# Patient Record
Sex: Male | Born: 1935
Health system: Southern US, Community
[De-identification: ages and names within clinical notes are randomized; demographics above are authoritative.]

## PROBLEM LIST (undated history)

## (undated) DIAGNOSIS — D45 Polycythemia vera: Secondary | ICD-10-CM

## (undated) DIAGNOSIS — N400 Enlarged prostate without lower urinary tract symptoms: Secondary | ICD-10-CM

## (undated) DIAGNOSIS — D759 Disease of blood and blood-forming organs, unspecified: Secondary | ICD-10-CM

## (undated) DIAGNOSIS — F329 Major depressive disorder, single episode, unspecified: Secondary | ICD-10-CM

## (undated) DIAGNOSIS — I10 Essential (primary) hypertension: Secondary | ICD-10-CM

## (undated) DIAGNOSIS — E785 Hyperlipidemia, unspecified: Secondary | ICD-10-CM

## (undated) DIAGNOSIS — I350 Nonrheumatic aortic (valve) stenosis: Secondary | ICD-10-CM

## (undated) DIAGNOSIS — F32A Depression, unspecified: Secondary | ICD-10-CM

## (undated) DIAGNOSIS — M1990 Unspecified osteoarthritis, unspecified site: Secondary | ICD-10-CM

## (undated) HISTORY — DX: Disease of blood and blood-forming organs, unspecified: D75.9

## (undated) HISTORY — DX: Nonrheumatic aortic (valve) stenosis: I35.0

## (undated) HISTORY — DX: Depression, unspecified: F32.A

## (undated) HISTORY — DX: Benign prostatic hyperplasia without lower urinary tract symptoms: N40.0

## (undated) HISTORY — DX: Hyperlipidemia, unspecified: E78.5

## (undated) HISTORY — DX: Essential (primary) hypertension: I10

## (undated) HISTORY — DX: Unspecified osteoarthritis, unspecified site: M19.90

## (undated) HISTORY — DX: Major depressive disorder, single episode, unspecified: F32.9

## (undated) HISTORY — PX: HEMORROIDECTOMY: SUR656

## (undated) HISTORY — DX: Polycythemia vera: D45

---

## 1999-11-29 ENCOUNTER — Encounter (HOSPITAL_COMMUNITY): Admission: RE | Admit: 1999-11-29 | Discharge: 2000-02-27 | Payer: Self-pay | Admitting: Internal Medicine

## 2001-07-12 ENCOUNTER — Encounter (INDEPENDENT_AMBULATORY_CARE_PROVIDER_SITE_OTHER): Payer: Self-pay | Admitting: Specialist

## 2001-07-12 ENCOUNTER — Ambulatory Visit (HOSPITAL_COMMUNITY): Admission: RE | Admit: 2001-07-12 | Discharge: 2001-07-12 | Payer: Self-pay | Admitting: Oncology

## 2001-08-06 ENCOUNTER — Encounter (HOSPITAL_COMMUNITY): Payer: Self-pay | Admitting: Oncology

## 2001-08-06 ENCOUNTER — Ambulatory Visit (HOSPITAL_COMMUNITY): Admission: RE | Admit: 2001-08-06 | Discharge: 2001-08-06 | Payer: Self-pay | Admitting: Oncology

## 2002-04-03 ENCOUNTER — Encounter: Admission: RE | Admit: 2002-04-03 | Discharge: 2002-04-03 | Payer: Self-pay | Admitting: Internal Medicine

## 2002-04-03 ENCOUNTER — Encounter: Payer: Self-pay | Admitting: Internal Medicine

## 2002-11-14 ENCOUNTER — Ambulatory Visit (HOSPITAL_COMMUNITY): Admission: RE | Admit: 2002-11-14 | Discharge: 2002-11-14 | Payer: Self-pay | Admitting: Gastroenterology

## 2002-11-14 ENCOUNTER — Encounter (INDEPENDENT_AMBULATORY_CARE_PROVIDER_SITE_OTHER): Payer: Self-pay | Admitting: Specialist

## 2004-05-30 ENCOUNTER — Ambulatory Visit: Payer: Self-pay | Admitting: Oncology

## 2004-07-26 ENCOUNTER — Ambulatory Visit: Payer: Self-pay | Admitting: Oncology

## 2004-09-20 ENCOUNTER — Ambulatory Visit: Payer: Self-pay | Admitting: Oncology

## 2004-11-14 ENCOUNTER — Ambulatory Visit: Payer: Self-pay | Admitting: Oncology

## 2005-01-16 ENCOUNTER — Ambulatory Visit: Payer: Self-pay | Admitting: Oncology

## 2005-03-14 ENCOUNTER — Ambulatory Visit: Payer: Self-pay | Admitting: Oncology

## 2005-05-09 ENCOUNTER — Ambulatory Visit: Payer: Self-pay | Admitting: Oncology

## 2005-07-04 ENCOUNTER — Ambulatory Visit: Payer: Self-pay | Admitting: Oncology

## 2005-08-29 ENCOUNTER — Ambulatory Visit: Payer: Self-pay | Admitting: Oncology

## 2005-10-25 ENCOUNTER — Ambulatory Visit: Payer: Self-pay | Admitting: Oncology

## 2005-10-25 LAB — CBC WITH DIFFERENTIAL/PLATELET
BASO%: 0.9 % (ref 0.0–2.0)
HCT: 46.2 % (ref 38.7–49.9)
HGB: 14.1 g/dL (ref 13.0–17.1)
LYMPH%: 6.9 % — ABNORMAL LOW (ref 14.0–48.0)
MCH: 19.8 pg — ABNORMAL LOW (ref 28.0–33.4)
MONO#: 0.7 10*3/uL (ref 0.1–0.9)
MONO%: 4 % (ref 0.0–13.0)
NEUT#: 14.5 10*3/uL — ABNORMAL HIGH (ref 1.5–6.5)
RBC: 7.11 10*6/uL — ABNORMAL HIGH (ref 4.20–5.71)

## 2005-11-21 LAB — CBC WITH DIFFERENTIAL/PLATELET
BASO%: 0.1 % (ref 0.0–2.0)
Basophils Absolute: 0 10*3/uL (ref 0.0–0.1)
HCT: 43 % (ref 38.7–49.9)
HGB: 13.2 g/dL (ref 13.0–17.1)
MCHC: 30.7 g/dL — ABNORMAL LOW (ref 32.0–35.9)
MONO#: 0.8 10*3/uL (ref 0.1–0.9)
NEUT%: 84.5 % — ABNORMAL HIGH (ref 40.0–75.0)
RDW: 19.9 % — ABNORMAL HIGH (ref 11.2–14.6)
WBC: 16.7 10*3/uL — ABNORMAL HIGH (ref 4.0–10.0)
lymph#: 1.3 10*3/uL (ref 0.9–3.3)

## 2005-12-13 ENCOUNTER — Ambulatory Visit: Payer: Self-pay | Admitting: Oncology

## 2005-12-27 LAB — CBC WITH DIFFERENTIAL/PLATELET
BASO%: 0.3 % (ref 0.0–2.0)
EOS%: 2.4 % (ref 0.0–7.0)
MCHC: 30.5 g/dL — ABNORMAL LOW (ref 32.0–35.9)
MONO#: 0.8 10*3/uL (ref 0.1–0.9)
RBC: 7.04 10*6/uL — ABNORMAL HIGH (ref 4.20–5.71)
WBC: 17.1 10*3/uL — ABNORMAL HIGH (ref 4.0–10.0)
lymph#: 1.2 10*3/uL (ref 0.9–3.3)

## 2006-01-23 LAB — CBC WITH DIFFERENTIAL/PLATELET
BASO%: 0.7 % (ref 0.0–2.0)
EOS%: 2.9 % (ref 0.0–7.0)
HCT: 45.2 % (ref 38.7–49.9)
MCH: 19.9 pg — ABNORMAL LOW (ref 28.0–33.4)
MCHC: 30.7 g/dL — ABNORMAL LOW (ref 32.0–35.9)
NEUT%: 84.1 % — ABNORMAL HIGH (ref 40.0–75.0)
RBC: 6.99 10*6/uL — ABNORMAL HIGH (ref 4.20–5.71)
lymph#: 1.4 10*3/uL (ref 0.9–3.3)

## 2006-02-15 ENCOUNTER — Ambulatory Visit: Payer: Self-pay | Admitting: Oncology

## 2006-02-19 LAB — COMPREHENSIVE METABOLIC PANEL
ALT: 15 U/L (ref 0–40)
AST: 16 U/L (ref 0–37)
Albumin: 4.2 g/dL (ref 3.5–5.2)
Alkaline Phosphatase: 65 U/L (ref 39–117)
Potassium: 4.6 mEq/L (ref 3.5–5.3)
Sodium: 140 mEq/L (ref 135–145)
Total Bilirubin: 0.7 mg/dL (ref 0.3–1.2)
Total Protein: 6.1 g/dL (ref 6.0–8.3)

## 2006-02-19 LAB — CBC WITH DIFFERENTIAL/PLATELET
BASO%: 0.9 % (ref 0.0–2.0)
Eosinophils Absolute: 0.4 10*3/uL (ref 0.0–0.5)
LYMPH%: 8.3 % — ABNORMAL LOW (ref 14.0–48.0)
MCHC: 31 g/dL — ABNORMAL LOW (ref 32.0–35.9)
MCV: 64.8 fL — ABNORMAL LOW (ref 81.6–98.0)
MONO%: 5.1 % (ref 0.0–13.0)
NEUT#: 13.6 10*3/uL — ABNORMAL HIGH (ref 1.5–6.5)
Platelets: 274 10*3/uL (ref 145–400)
RBC: 6.73 10*6/uL — ABNORMAL HIGH (ref 4.20–5.71)
RDW: 20 % — ABNORMAL HIGH (ref 11.2–14.6)
WBC: 16.4 10*3/uL — ABNORMAL HIGH (ref 4.0–10.0)

## 2006-03-22 LAB — CBC WITH DIFFERENTIAL/PLATELET
BASO%: 1.3 % (ref 0.0–2.0)
EOS%: 2.1 % (ref 0.0–7.0)
HCT: 45.8 % (ref 38.7–49.9)
LYMPH%: 7.5 % — ABNORMAL LOW (ref 14.0–48.0)
MCH: 19.7 pg — ABNORMAL LOW (ref 28.0–33.4)
MCHC: 30.2 g/dL — ABNORMAL LOW (ref 32.0–35.9)
MCV: 65.4 fL — ABNORMAL LOW (ref 81.6–98.0)
MONO#: 0.7 10*3/uL (ref 0.1–0.9)
MONO%: 3.7 % (ref 0.0–13.0)
NEUT%: 85.4 % — ABNORMAL HIGH (ref 40.0–75.0)
Platelets: 331 10*3/uL (ref 145–400)
RBC: 6.99 10*6/uL — ABNORMAL HIGH (ref 4.20–5.71)

## 2006-04-20 ENCOUNTER — Ambulatory Visit: Payer: Self-pay | Admitting: Oncology

## 2006-05-22 LAB — CBC WITH DIFFERENTIAL/PLATELET
BASO%: 0.2 % (ref 0.0–2.0)
Basophils Absolute: 0 10*3/uL (ref 0.0–0.1)
HCT: 45.2 % (ref 38.7–49.9)
HGB: 13.8 g/dL (ref 13.0–17.1)
MCHC: 30.4 g/dL — ABNORMAL LOW (ref 32.0–35.9)
MONO#: 0.3 10*3/uL (ref 0.1–0.9)
NEUT#: 14.7 10*3/uL — ABNORMAL HIGH (ref 1.5–6.5)
NEUT%: 86.4 % — ABNORMAL HIGH (ref 40.0–75.0)
WBC: 17 10*3/uL — ABNORMAL HIGH (ref 4.0–10.0)
lymph#: 1.4 10*3/uL (ref 0.9–3.3)

## 2006-05-22 LAB — COMPREHENSIVE METABOLIC PANEL
ALT: 16 U/L (ref 0–53)
CO2: 29 mEq/L (ref 19–32)
Calcium: 8.8 mg/dL (ref 8.4–10.5)
Chloride: 103 mEq/L (ref 96–112)
Creatinine, Ser: 1.19 mg/dL (ref 0.40–1.50)
Total Protein: 6 g/dL (ref 6.0–8.3)

## 2006-05-22 LAB — LACTATE DEHYDROGENASE: LDH: 205 U/L (ref 94–250)

## 2006-06-15 ENCOUNTER — Ambulatory Visit: Payer: Self-pay | Admitting: Oncology

## 2006-06-19 LAB — CBC WITH DIFFERENTIAL/PLATELET
BASO%: 1.6 % (ref 0.0–2.0)
LYMPH%: 8.3 % — ABNORMAL LOW (ref 14.0–48.0)
MCHC: 29.7 g/dL — ABNORMAL LOW (ref 32.0–35.9)
MCV: 65.4 fL — ABNORMAL LOW (ref 81.6–98.0)
MONO%: 5 % (ref 0.0–13.0)
Platelets: 378 10*3/uL (ref 145–400)
RBC: 6.65 10*6/uL — ABNORMAL HIGH (ref 4.20–5.71)
RDW: 14.9 % — ABNORMAL HIGH (ref 11.2–14.6)
WBC: 17.5 10*3/uL — ABNORMAL HIGH (ref 4.0–10.0)

## 2006-07-16 LAB — CBC WITH DIFFERENTIAL/PLATELET
BASO%: 0.5 % (ref 0.0–2.0)
MCHC: 30 g/dL — ABNORMAL LOW (ref 32.0–35.9)
MONO#: 0.8 10*3/uL (ref 0.1–0.9)
RBC: 6.87 10*6/uL — ABNORMAL HIGH (ref 4.20–5.71)
RDW: 20 % — ABNORMAL HIGH (ref 11.2–14.6)
WBC: 18.4 10*3/uL — ABNORMAL HIGH (ref 4.0–10.0)
lymph#: 1.6 10*3/uL (ref 0.9–3.3)

## 2006-08-08 ENCOUNTER — Ambulatory Visit: Payer: Self-pay | Admitting: Oncology

## 2006-08-13 LAB — CBC WITH DIFFERENTIAL/PLATELET
BASO%: 1.3 % (ref 0.0–2.0)
MCHC: 28.9 g/dL — ABNORMAL LOW (ref 32.0–35.9)
MONO#: 0.7 10*3/uL (ref 0.1–0.9)
RBC: 7.18 10*6/uL — ABNORMAL HIGH (ref 4.20–5.71)
RDW: 14.8 % — ABNORMAL HIGH (ref 11.2–14.6)
WBC: 18.4 10*3/uL — ABNORMAL HIGH (ref 4.0–10.0)
lymph#: 1.5 10*3/uL (ref 0.9–3.3)

## 2006-09-10 LAB — CBC WITH DIFFERENTIAL/PLATELET
BASO%: 1 % (ref 0.0–2.0)
Basophils Absolute: 0.2 10*3/uL — ABNORMAL HIGH (ref 0.0–0.1)
HCT: 44.3 % (ref 38.7–49.9)
HGB: 13.7 g/dL (ref 13.0–17.1)
MCHC: 30.8 g/dL — ABNORMAL LOW (ref 32.0–35.9)
MONO#: 0.6 10*3/uL (ref 0.1–0.9)
NEUT%: 84.3 % — ABNORMAL HIGH (ref 40.0–75.0)
WBC: 16.5 10*3/uL — ABNORMAL HIGH (ref 4.0–10.0)
lymph#: 1.2 10*3/uL (ref 0.9–3.3)

## 2006-10-03 ENCOUNTER — Ambulatory Visit: Payer: Self-pay | Admitting: Oncology

## 2006-10-11 LAB — CBC WITH DIFFERENTIAL/PLATELET
Basophils Absolute: 0.2 10*3/uL — ABNORMAL HIGH (ref 0.0–0.1)
Eosinophils Absolute: 0.5 10*3/uL (ref 0.0–0.5)
HCT: 49 % (ref 38.7–49.9)
HGB: 14.3 g/dL (ref 13.0–17.1)
LYMPH%: 6.8 % — ABNORMAL LOW (ref 14.0–48.0)
MONO#: 0.9 10*3/uL (ref 0.1–0.9)
NEUT#: 17.7 10*3/uL — ABNORMAL HIGH (ref 1.5–6.5)
NEUT%: 85.2 % — ABNORMAL HIGH (ref 40.0–75.0)
Platelets: 319 10*3/uL (ref 145–400)
RBC: 7.45 10*6/uL — ABNORMAL HIGH (ref 4.20–5.71)
WBC: 20.7 10*3/uL — ABNORMAL HIGH (ref 4.0–10.0)

## 2006-11-05 LAB — CBC WITH DIFFERENTIAL/PLATELET
BASO%: 1.2 % (ref 0.0–2.0)
HCT: 46.6 % (ref 38.7–49.9)
LYMPH%: 8.2 % — ABNORMAL LOW (ref 14.0–48.0)
MCHC: 29.2 g/dL — ABNORMAL LOW (ref 32.0–35.9)
MCV: 66 fL — ABNORMAL LOW (ref 81.6–98.0)
MONO#: 0.7 10*3/uL (ref 0.1–0.9)
NEUT%: 83.1 % — ABNORMAL HIGH (ref 40.0–75.0)
Platelets: 309 10*3/uL (ref 145–400)
WBC: 18.9 10*3/uL — ABNORMAL HIGH (ref 4.0–10.0)

## 2006-11-28 ENCOUNTER — Ambulatory Visit: Payer: Self-pay | Admitting: Oncology

## 2006-11-30 LAB — CBC WITH DIFFERENTIAL/PLATELET
BASO%: 0.4 % (ref 0.0–2.0)
EOS%: 2 % (ref 0.0–7.0)
HCT: 43.4 % (ref 38.7–49.9)
LYMPH%: 7.6 % — ABNORMAL LOW (ref 14.0–48.0)
MCH: 19.9 pg — ABNORMAL LOW (ref 28.0–33.4)
MCHC: 31.5 g/dL — ABNORMAL LOW (ref 32.0–35.9)
NEUT%: 86.9 % — ABNORMAL HIGH (ref 40.0–75.0)
Platelets: 371 10*3/uL (ref 145–400)

## 2006-11-30 LAB — COMPREHENSIVE METABOLIC PANEL
AST: 18 U/L (ref 0–37)
Alkaline Phosphatase: 74 U/L (ref 39–117)
BUN: 26 mg/dL — ABNORMAL HIGH (ref 6–23)
Creatinine, Ser: 1.15 mg/dL (ref 0.40–1.50)

## 2006-12-21 LAB — CBC WITH DIFFERENTIAL/PLATELET
BASO%: 1.4 % (ref 0.0–2.0)
HCT: 46.5 % (ref 38.7–49.9)
MCHC: 29.6 g/dL — ABNORMAL LOW (ref 32.0–35.9)
MONO#: 0.8 10*3/uL (ref 0.1–0.9)
NEUT%: 83.5 % — ABNORMAL HIGH (ref 40.0–75.0)
RBC: 7.15 10*6/uL — ABNORMAL HIGH (ref 4.20–5.71)
RDW: 14.6 % (ref 11.2–14.6)
WBC: 18.5 10*3/uL — ABNORMAL HIGH (ref 4.0–10.0)
lymph#: 1.5 10*3/uL (ref 0.9–3.3)

## 2007-01-16 ENCOUNTER — Ambulatory Visit: Payer: Self-pay | Admitting: Oncology

## 2007-01-21 LAB — CBC WITH DIFFERENTIAL/PLATELET
Basophils Absolute: 0.2 10*3/uL — ABNORMAL HIGH (ref 0.0–0.1)
Eosinophils Absolute: 0.5 10*3/uL (ref 0.0–0.5)
HCT: 45.8 % (ref 38.7–49.9)
HGB: 13.5 g/dL (ref 13.0–17.1)
MONO#: 0.7 10*3/uL (ref 0.1–0.9)
NEUT#: 16.1 10*3/uL — ABNORMAL HIGH (ref 1.5–6.5)
NEUT%: 85.2 % — ABNORMAL HIGH (ref 40.0–75.0)
RDW: 14.6 % (ref 11.2–14.6)
WBC: 18.9 10*3/uL — ABNORMAL HIGH (ref 4.0–10.0)
lymph#: 1.4 10*3/uL (ref 0.9–3.3)

## 2007-02-15 LAB — CBC WITH DIFFERENTIAL/PLATELET
Basophils Absolute: 0.3 10*3/uL — ABNORMAL HIGH (ref 0.0–0.1)
EOS%: 2.9 % (ref 0.0–7.0)
Eosinophils Absolute: 0.6 10*3/uL — ABNORMAL HIGH (ref 0.0–0.5)
HCT: 43.9 % (ref 38.7–49.9)
HGB: 12.6 g/dL — ABNORMAL LOW (ref 13.0–17.1)
MCH: 19.2 pg — ABNORMAL LOW (ref 28.0–33.4)
MCV: 66.8 fL — ABNORMAL LOW (ref 81.6–98.0)
MONO%: 4.3 % (ref 0.0–13.0)
NEUT#: 16.2 10*3/uL — ABNORMAL HIGH (ref 1.5–6.5)
NEUT%: 82.9 % — ABNORMAL HIGH (ref 40.0–75.0)

## 2007-03-13 ENCOUNTER — Ambulatory Visit: Payer: Self-pay | Admitting: Oncology

## 2007-03-15 LAB — CBC WITH DIFFERENTIAL/PLATELET
Basophils Absolute: 0.3 10*3/uL — ABNORMAL HIGH (ref 0.0–0.1)
EOS%: 3 % (ref 0.0–7.0)
Eosinophils Absolute: 0.6 10*3/uL — ABNORMAL HIGH (ref 0.0–0.5)
HGB: 12.9 g/dL — ABNORMAL LOW (ref 13.0–17.1)
LYMPH%: 8.1 % — ABNORMAL LOW (ref 14.0–48.0)
MCH: 19 pg — ABNORMAL LOW (ref 28.0–33.4)
MCV: 66.5 fL — ABNORMAL LOW (ref 81.6–98.0)
MONO%: 3.6 % (ref 0.0–13.0)
NEUT#: 15.8 10*3/uL — ABNORMAL HIGH (ref 1.5–6.5)
Platelets: 330 10*3/uL (ref 145–400)
RBC: 6.78 10*6/uL — ABNORMAL HIGH (ref 4.20–5.71)
RDW: 14.7 % — ABNORMAL HIGH (ref 11.2–14.6)

## 2007-04-17 LAB — CBC WITH DIFFERENTIAL/PLATELET
BASO%: 1.2 % (ref 0.0–2.0)
EOS%: 3.1 % (ref 0.0–7.0)
LYMPH%: 8.5 % — ABNORMAL LOW (ref 14.0–48.0)
MCH: 20.1 pg — ABNORMAL LOW (ref 28.0–33.4)
MCHC: 29.3 g/dL — ABNORMAL LOW (ref 32.0–35.9)
MCV: 68.6 fL — ABNORMAL LOW (ref 81.6–98.0)
MONO%: 4.8 % (ref 0.0–13.0)
NEUT#: 15.7 10*3/uL — ABNORMAL HIGH (ref 1.5–6.5)
Platelets: 318 10*3/uL (ref 145–400)
RBC: 6.85 10*6/uL — ABNORMAL HIGH (ref 4.20–5.71)
RDW: 14.5 % (ref 11.2–14.6)

## 2007-05-08 ENCOUNTER — Ambulatory Visit: Payer: Self-pay | Admitting: Oncology

## 2007-05-10 LAB — CBC WITH DIFFERENTIAL/PLATELET
BASO%: 1.4 % (ref 0.0–2.0)
Eosinophils Absolute: 0.5 10*3/uL (ref 0.0–0.5)
LYMPH%: 6.9 % — ABNORMAL LOW (ref 14.0–48.0)
MCHC: 29.4 g/dL — ABNORMAL LOW (ref 32.0–35.9)
MCV: 68 fL — ABNORMAL LOW (ref 81.6–98.0)
MONO#: 0.9 10*3/uL (ref 0.1–0.9)
MONO%: 4.6 % (ref 0.0–13.0)
NEUT#: 16.4 10*3/uL — ABNORMAL HIGH (ref 1.5–6.5)
RBC: 6.37 10*6/uL — ABNORMAL HIGH (ref 4.20–5.71)
RDW: 14.2 % (ref 11.2–14.6)
WBC: 19.3 10*3/uL — ABNORMAL HIGH (ref 4.0–10.0)

## 2007-06-07 LAB — COMPREHENSIVE METABOLIC PANEL
ALT: 14 U/L (ref 0–53)
Albumin: 4.2 g/dL (ref 3.5–5.2)
CO2: 26 mEq/L (ref 19–32)
Calcium: 9.1 mg/dL (ref 8.4–10.5)
Chloride: 102 mEq/L (ref 96–112)
Glucose, Bld: 108 mg/dL — ABNORMAL HIGH (ref 70–99)
Potassium: 4.6 mEq/L (ref 3.5–5.3)
Sodium: 140 mEq/L (ref 135–145)
Total Protein: 6.1 g/dL (ref 6.0–8.3)

## 2007-06-07 LAB — LACTATE DEHYDROGENASE: LDH: 219 U/L (ref 94–250)

## 2007-06-07 LAB — CBC WITH DIFFERENTIAL/PLATELET
BASO%: 0 % (ref 0.0–2.0)
Eosinophils Absolute: 0.5 10*3/uL (ref 0.0–0.5)
LYMPH%: 7.8 % — ABNORMAL LOW (ref 14.0–48.0)
MCHC: 31.3 g/dL — ABNORMAL LOW (ref 32.0–35.9)
MONO#: 0.8 10*3/uL (ref 0.1–0.9)
NEUT#: 16.3 10*3/uL — ABNORMAL HIGH (ref 1.5–6.5)
Platelets: 278 10*3/uL (ref 145–400)
RBC: 6.42 10*6/uL — ABNORMAL HIGH (ref 4.20–5.71)
WBC: 19.1 10*3/uL — ABNORMAL HIGH (ref 4.0–10.0)
lymph#: 1.5 10*3/uL (ref 0.9–3.3)

## 2007-07-03 ENCOUNTER — Ambulatory Visit: Payer: Self-pay | Admitting: Oncology

## 2007-07-05 LAB — CBC WITH DIFFERENTIAL/PLATELET
BASO%: 0.5 % (ref 0.0–2.0)
EOS%: 2.3 % (ref 0.0–7.0)
HCT: 40.9 % (ref 38.7–49.9)
MCH: 20.3 pg — ABNORMAL LOW (ref 28.0–33.4)
MCHC: 31.7 g/dL — ABNORMAL LOW (ref 32.0–35.9)
MONO#: 0.6 10*3/uL (ref 0.1–0.9)
NEUT%: 87 % — ABNORMAL HIGH (ref 40.0–75.0)
RBC: 6.39 10*6/uL — ABNORMAL HIGH (ref 4.20–5.71)
RDW: 18.7 % — ABNORMAL HIGH (ref 11.2–14.6)
WBC: 19.9 10*3/uL — ABNORMAL HIGH (ref 4.0–10.0)
lymph#: 1.4 10*3/uL (ref 0.9–3.3)

## 2007-08-02 LAB — CBC WITH DIFFERENTIAL/PLATELET
BASO%: 1.3 % (ref 0.0–2.0)
Basophils Absolute: 0.3 10*3/uL — ABNORMAL HIGH (ref 0.0–0.1)
EOS%: 2.4 % (ref 0.0–7.0)
HGB: 13.9 g/dL (ref 13.0–17.1)
MCH: 19.1 pg — ABNORMAL LOW (ref 28.0–33.4)
MCHC: 28.6 g/dL — ABNORMAL LOW (ref 32.0–35.9)
MONO#: 0.8 10*3/uL (ref 0.1–0.9)
RDW: 13.4 % (ref 11.2–14.6)
WBC: 20.5 10*3/uL — ABNORMAL HIGH (ref 4.0–10.0)
lymph#: 1.3 10*3/uL (ref 0.9–3.3)

## 2007-08-28 ENCOUNTER — Ambulatory Visit: Payer: Self-pay | Admitting: Oncology

## 2007-08-30 LAB — CBC WITH DIFFERENTIAL/PLATELET
BASO%: 1.5 % (ref 0.0–2.0)
Basophils Absolute: 0.3 10*3/uL — ABNORMAL HIGH (ref 0.0–0.1)
EOS%: 3.1 % (ref 0.0–7.0)
HCT: 45.8 % (ref 38.7–49.9)
HGB: 13.2 g/dL (ref 13.0–17.1)
MCH: 19.2 pg — ABNORMAL LOW (ref 28.0–33.4)
MONO#: 1 10*3/uL — ABNORMAL HIGH (ref 0.1–0.9)
NEUT%: 83.5 % — ABNORMAL HIGH (ref 40.0–75.0)
RDW: 13.7 % (ref 11.2–14.6)
WBC: 20.6 10*3/uL — ABNORMAL HIGH (ref 4.0–10.0)
lymph#: 1.4 10*3/uL (ref 0.9–3.3)

## 2007-09-27 LAB — CBC WITH DIFFERENTIAL/PLATELET
BASO%: 1.5 % (ref 0.0–2.0)
EOS%: 3.4 % (ref 0.0–7.0)
MCH: 19.5 pg — ABNORMAL LOW (ref 28.0–33.4)
MCHC: 30.7 g/dL — ABNORMAL LOW (ref 32.0–35.9)
MONO#: 0.7 10*3/uL (ref 0.1–0.9)
NEUT%: 84 % — ABNORMAL HIGH (ref 40.0–75.0)
RBC: 6.7 10*6/uL — ABNORMAL HIGH (ref 4.20–5.71)
RDW: 16.8 % — ABNORMAL HIGH (ref 11.2–14.6)
WBC: 19.6 10*3/uL — ABNORMAL HIGH (ref 4.0–10.0)
lymph#: 1.4 10*3/uL (ref 0.9–3.3)

## 2007-10-24 ENCOUNTER — Ambulatory Visit: Payer: Self-pay | Admitting: Oncology

## 2007-10-24 LAB — CBC WITH DIFFERENTIAL/PLATELET
BASO%: 1 % (ref 0.0–2.0)
EOS%: 2.5 % (ref 0.0–7.0)
HCT: 47.4 % (ref 38.7–49.9)
LYMPH%: 7.3 % — ABNORMAL LOW (ref 14.0–48.0)
MCH: 19.3 pg — ABNORMAL LOW (ref 28.0–33.4)
MCHC: 30 g/dL — ABNORMAL LOW (ref 32.0–35.9)
MCV: 64.5 fL — ABNORMAL LOW (ref 81.6–98.0)
MONO#: 1.1 10*3/uL — ABNORMAL HIGH (ref 0.1–0.9)
MONO%: 4.7 % (ref 0.0–13.0)
NEUT%: 84.6 % — ABNORMAL HIGH (ref 40.0–75.0)
Platelets: 325 10*3/uL (ref 145–400)
RBC: 7.35 10*6/uL — ABNORMAL HIGH (ref 4.20–5.71)
WBC: 23.1 10*3/uL — ABNORMAL HIGH (ref 4.0–10.0)

## 2007-11-22 LAB — CBC WITH DIFFERENTIAL/PLATELET
BASO%: 1.5 % (ref 0.0–2.0)
Eosinophils Absolute: 0.7 10*3/uL — ABNORMAL HIGH (ref 0.0–0.5)
MCHC: 30.4 g/dL — ABNORMAL LOW (ref 32.0–35.9)
MONO#: 0.8 10*3/uL (ref 0.1–0.9)
MONO%: 3.9 % (ref 0.0–13.0)
NEUT#: 17.5 10*3/uL — ABNORMAL HIGH (ref 1.5–6.5)
RBC: 6.7 10*6/uL — ABNORMAL HIGH (ref 4.20–5.71)
RDW: 16.8 % — ABNORMAL HIGH (ref 11.2–14.6)
WBC: 20.8 10*3/uL — ABNORMAL HIGH (ref 4.0–10.0)

## 2007-11-22 LAB — COMPREHENSIVE METABOLIC PANEL
ALT: 17 U/L (ref 0–53)
Albumin: 4.2 g/dL (ref 3.5–5.2)
Alkaline Phosphatase: 67 U/L (ref 39–117)
Glucose, Bld: 121 mg/dL — ABNORMAL HIGH (ref 70–99)
Potassium: 4.6 mEq/L (ref 3.5–5.3)
Sodium: 139 mEq/L (ref 135–145)
Total Protein: 6.2 g/dL (ref 6.0–8.3)

## 2007-12-18 ENCOUNTER — Ambulatory Visit: Payer: Self-pay | Admitting: Oncology

## 2007-12-20 LAB — CBC WITH DIFFERENTIAL/PLATELET
BASO%: 1.3 % (ref 0.0–2.0)
Basophils Absolute: 0.3 10*3/uL — ABNORMAL HIGH (ref 0.0–0.1)
HCT: 42.6 % (ref 38.7–49.9)
HGB: 13.2 g/dL (ref 13.0–17.1)
MONO#: 0.8 10*3/uL (ref 0.1–0.9)
NEUT#: 18.7 10*3/uL — ABNORMAL HIGH (ref 1.5–6.5)
NEUT%: 85.6 % — ABNORMAL HIGH (ref 40.0–75.0)
RDW: 16.7 % — ABNORMAL HIGH (ref 11.2–14.6)
WBC: 21.9 10*3/uL — ABNORMAL HIGH (ref 4.0–10.0)
lymph#: 1.4 10*3/uL (ref 0.9–3.3)

## 2008-01-20 LAB — CBC WITH DIFFERENTIAL/PLATELET
BASO%: 1.5 % (ref 0.0–2.0)
HCT: 45.2 % (ref 38.7–49.9)
MCHC: 30.7 g/dL — ABNORMAL LOW (ref 32.0–35.9)
MONO#: 1 10*3/uL — ABNORMAL HIGH (ref 0.1–0.9)
RBC: 7.08 10*6/uL — ABNORMAL HIGH (ref 4.20–5.71)
RDW: 17.2 % — ABNORMAL HIGH (ref 11.2–14.6)
WBC: 23.9 10*3/uL — ABNORMAL HIGH (ref 4.0–10.0)
lymph#: 1.5 10*3/uL (ref 0.9–3.3)

## 2008-02-09 ENCOUNTER — Ambulatory Visit: Payer: Self-pay | Admitting: Oncology

## 2008-02-17 LAB — CBC WITH DIFFERENTIAL/PLATELET
BASO%: 1.2 % (ref 0.0–2.0)
EOS%: 3.3 % (ref 0.0–7.0)
LYMPH%: 7.1 % — ABNORMAL LOW (ref 14.0–48.0)
MCH: 19.5 pg — ABNORMAL LOW (ref 28.0–33.4)
MCHC: 30.4 g/dL — ABNORMAL LOW (ref 32.0–35.9)
MONO#: 1 10*3/uL — ABNORMAL HIGH (ref 0.1–0.9)
MONO%: 4.4 % (ref 0.0–13.0)
Platelets: 291 10*3/uL (ref 145–400)
RBC: 6.86 10*6/uL — ABNORMAL HIGH (ref 4.20–5.71)
WBC: 21.6 10*3/uL — ABNORMAL HIGH (ref 4.0–10.0)

## 2008-03-17 LAB — CBC WITH DIFFERENTIAL/PLATELET
BASO%: 1.3 % (ref 0.0–2.0)
Basophils Absolute: 0.3 10*3/uL — ABNORMAL HIGH (ref 0.0–0.1)
EOS%: 3 % (ref 0.0–7.0)
HGB: 13.4 g/dL (ref 13.0–17.1)
MCH: 19.3 pg — ABNORMAL LOW (ref 28.0–33.4)
MCHC: 30.3 g/dL — ABNORMAL LOW (ref 32.0–35.9)
MCV: 63.8 fL — ABNORMAL LOW (ref 81.6–98.0)
MONO%: 4.5 % (ref 0.0–13.0)
NEUT%: 85.1 % — ABNORMAL HIGH (ref 40.0–75.0)
RDW: 16.4 % — ABNORMAL HIGH (ref 11.2–14.6)

## 2008-04-09 ENCOUNTER — Ambulatory Visit: Payer: Self-pay | Admitting: Oncology

## 2008-04-15 LAB — CBC WITH DIFFERENTIAL/PLATELET
Basophils Absolute: 0.3 10*3/uL — ABNORMAL HIGH (ref 0.0–0.1)
EOS%: 3.3 % (ref 0.0–7.0)
HGB: 13.1 g/dL (ref 13.0–17.1)
MCH: 19.2 pg — ABNORMAL LOW (ref 28.0–33.4)
MCV: 63.9 fL — ABNORMAL LOW (ref 81.6–98.0)
MONO%: 4.2 % (ref 0.0–13.0)
RBC: 6.8 10*6/uL — ABNORMAL HIGH (ref 4.20–5.71)
RDW: 16.6 % — ABNORMAL HIGH (ref 11.2–14.6)

## 2008-05-12 LAB — CBC WITH DIFFERENTIAL/PLATELET
Basophils Absolute: 0.3 10*3/uL — ABNORMAL HIGH (ref 0.0–0.1)
EOS%: 3.2 % (ref 0.0–7.0)
HCT: 40.4 % (ref 38.7–49.9)
HGB: 12.2 g/dL — ABNORMAL LOW (ref 13.0–17.1)
LYMPH%: 6.3 % — ABNORMAL LOW (ref 14.0–48.0)
MCH: 19.1 pg — ABNORMAL LOW (ref 28.0–33.4)
MCV: 63.2 fL — ABNORMAL LOW (ref 81.6–98.0)
NEUT%: 84.6 % — ABNORMAL HIGH (ref 40.0–75.0)
Platelets: 309 10*3/uL (ref 145–400)
lymph#: 1.2 10*3/uL (ref 0.9–3.3)

## 2008-06-05 ENCOUNTER — Ambulatory Visit: Payer: Self-pay | Admitting: Oncology

## 2008-06-09 LAB — CBC WITH DIFFERENTIAL/PLATELET
Basophils Absolute: 0 10*3/uL (ref 0.0–0.1)
Eosinophils Absolute: 0.5 10*3/uL (ref 0.0–0.5)
HCT: 42.1 % (ref 38.7–49.9)
HGB: 12.9 g/dL — ABNORMAL LOW (ref 13.0–17.1)
LYMPH%: 7.2 % — ABNORMAL LOW (ref 14.0–48.0)
MONO#: 0.7 10*3/uL (ref 0.1–0.9)
NEUT%: 86.7 % — ABNORMAL HIGH (ref 40.0–75.0)
Platelets: 279 10*3/uL (ref 145–400)
WBC: 20.5 10*3/uL — ABNORMAL HIGH (ref 4.0–10.0)
lymph#: 1.5 10*3/uL (ref 0.9–3.3)

## 2008-07-02 LAB — CBC WITH DIFFERENTIAL/PLATELET
BASO%: 1.4 % (ref 0.0–2.0)
Eosinophils Absolute: 0.6 10*3/uL — ABNORMAL HIGH (ref 0.0–0.5)
HCT: 45.2 % (ref 38.7–49.9)
MCHC: 30.2 g/dL — ABNORMAL LOW (ref 32.0–35.9)
MONO#: 0.9 10*3/uL (ref 0.1–0.9)
NEUT#: 19.2 10*3/uL — ABNORMAL HIGH (ref 1.5–6.5)
Platelets: 312 10*3/uL (ref 145–400)
RBC: 7.2 10*6/uL — ABNORMAL HIGH (ref 4.20–5.71)
WBC: 22.4 10*3/uL — ABNORMAL HIGH (ref 4.0–10.0)
lymph#: 1.4 10*3/uL (ref 0.9–3.3)

## 2008-07-31 ENCOUNTER — Ambulatory Visit: Payer: Self-pay | Admitting: Oncology

## 2008-08-04 LAB — CBC WITH DIFFERENTIAL/PLATELET
BASO%: 0.2 % (ref 0.0–2.0)
Eosinophils Absolute: 0.6 10*3/uL — ABNORMAL HIGH (ref 0.0–0.5)
LYMPH%: 5 % — ABNORMAL LOW (ref 14.0–48.0)
MCHC: 30.1 g/dL — ABNORMAL LOW (ref 32.0–35.9)
MONO#: 0.6 10*3/uL (ref 0.1–0.9)
MONO%: 3 % (ref 0.0–13.0)
NEUT#: 18.2 10*3/uL — ABNORMAL HIGH (ref 1.5–6.5)
Platelets: 260 10*3/uL (ref 145–400)
RBC: 7 10*6/uL — ABNORMAL HIGH (ref 4.20–5.71)
RDW: 20.4 % — ABNORMAL HIGH (ref 11.2–14.6)
WBC: 20.5 10*3/uL — ABNORMAL HIGH (ref 4.0–10.0)

## 2008-08-11 ENCOUNTER — Ambulatory Visit (HOSPITAL_COMMUNITY): Admission: RE | Admit: 2008-08-11 | Discharge: 2008-08-11 | Payer: Self-pay | Admitting: Gastroenterology

## 2008-09-01 LAB — CBC WITH DIFFERENTIAL/PLATELET
EOS%: 3.3 % (ref 0.0–7.0)
LYMPH%: 6.8 % — ABNORMAL LOW (ref 14.0–48.0)
MCH: 19.7 pg — ABNORMAL LOW (ref 28.0–33.4)
MCHC: 30.5 g/dL — ABNORMAL LOW (ref 32.0–35.9)
MCV: 64.7 fL — ABNORMAL LOW (ref 81.6–98.0)
MONO%: 3 % (ref 0.0–13.0)
NEUT#: 17.8 10*3/uL — ABNORMAL HIGH (ref 1.5–6.5)
Platelets: 338 10*3/uL (ref 145–400)
RBC: 6.45 10*6/uL — ABNORMAL HIGH (ref 4.20–5.71)
RDW: 19.7 % — ABNORMAL HIGH (ref 11.2–14.6)

## 2008-09-25 ENCOUNTER — Ambulatory Visit: Payer: Self-pay | Admitting: Oncology

## 2008-09-29 LAB — CBC WITH DIFFERENTIAL/PLATELET
BASO%: 1.3 % (ref 0.0–2.0)
Basophils Absolute: 0.3 10*3/uL — ABNORMAL HIGH (ref 0.0–0.1)
Eosinophils Absolute: 0.6 10*3/uL — ABNORMAL HIGH (ref 0.0–0.5)
HCT: 44.1 % (ref 38.4–49.9)
HGB: 13.2 g/dL (ref 13.0–17.1)
LYMPH%: 5.9 % — ABNORMAL LOW (ref 14.0–49.0)
MCHC: 29.9 g/dL — ABNORMAL LOW (ref 32.0–36.0)
MONO#: 1 10*3/uL — ABNORMAL HIGH (ref 0.1–0.9)
NEUT#: 18 10*3/uL — ABNORMAL HIGH (ref 1.5–6.5)
NEUT%: 85.5 % — ABNORMAL HIGH (ref 39.0–75.0)
Platelets: 288 10*3/uL (ref 140–400)
WBC: 21.1 10*3/uL — ABNORMAL HIGH (ref 4.0–10.3)
lymph#: 1.3 10*3/uL (ref 0.9–3.3)

## 2008-11-02 LAB — CBC WITH DIFFERENTIAL/PLATELET
Eosinophils Absolute: 0.6 10*3/uL — ABNORMAL HIGH (ref 0.0–0.5)
HCT: 42.7 % (ref 38.4–49.9)
LYMPH%: 6.8 % — ABNORMAL LOW (ref 14.0–49.0)
MCV: 64.7 fL — ABNORMAL LOW (ref 79.3–98.0)
MONO#: 0.4 10*3/uL (ref 0.1–0.9)
MONO%: 2 % (ref 0.0–14.0)
NEUT#: 18.7 10*3/uL — ABNORMAL HIGH (ref 1.5–6.5)
NEUT%: 88.4 % — ABNORMAL HIGH (ref 39.0–75.0)
Platelets: 274 10*3/uL (ref 140–400)
RBC: 6.61 10*6/uL — ABNORMAL HIGH (ref 4.20–5.82)
WBC: 21.2 10*3/uL — ABNORMAL HIGH (ref 4.0–10.3)

## 2008-11-02 LAB — COMPREHENSIVE METABOLIC PANEL
Alkaline Phosphatase: 66 U/L (ref 39–117)
BUN: 23 mg/dL (ref 6–23)
CO2: 25 mEq/L (ref 19–32)
Creatinine, Ser: 1.04 mg/dL (ref 0.40–1.50)
Glucose, Bld: 127 mg/dL — ABNORMAL HIGH (ref 70–99)
Sodium: 140 mEq/L (ref 135–145)
Total Bilirubin: 0.6 mg/dL (ref 0.3–1.2)

## 2008-11-02 LAB — LACTATE DEHYDROGENASE: LDH: 217 U/L (ref 94–250)

## 2008-11-30 ENCOUNTER — Ambulatory Visit: Payer: Self-pay | Admitting: Oncology

## 2008-12-02 LAB — CBC WITH DIFFERENTIAL/PLATELET
Basophils Absolute: 0.3 10*3/uL — ABNORMAL HIGH (ref 0.0–0.1)
EOS%: 2.6 % (ref 0.0–7.0)
Eosinophils Absolute: 0.5 10*3/uL (ref 0.0–0.5)
HCT: 45 % (ref 38.4–49.9)
HGB: 13.4 g/dL (ref 13.0–17.1)
MCH: 19.4 pg — ABNORMAL LOW (ref 27.2–33.4)
MCV: 65.1 fL — ABNORMAL LOW (ref 79.3–98.0)
MONO%: 4.3 % (ref 0.0–14.0)
NEUT#: 18 10*3/uL — ABNORMAL HIGH (ref 1.5–6.5)
NEUT%: 85.4 % — ABNORMAL HIGH (ref 39.0–75.0)
Platelets: 293 10*3/uL (ref 140–400)

## 2008-12-30 LAB — CBC WITH DIFFERENTIAL/PLATELET
BASO%: 0.7 % (ref 0.0–2.0)
Basophils Absolute: 0.2 10*3/uL — ABNORMAL HIGH (ref 0.0–0.1)
Eosinophils Absolute: 0.5 10*3/uL (ref 0.0–0.5)
HCT: 45.1 % (ref 38.4–49.9)
HGB: 13.6 g/dL (ref 13.0–17.1)
LYMPH%: 5.9 % — ABNORMAL LOW (ref 14.0–49.0)
MCHC: 30.2 g/dL — ABNORMAL LOW (ref 32.0–36.0)
MONO#: 0.8 10*3/uL (ref 0.1–0.9)
NEUT%: 86.8 % — ABNORMAL HIGH (ref 39.0–75.0)
Platelets: 279 10*3/uL (ref 140–400)
WBC: 20.3 10*3/uL — ABNORMAL HIGH (ref 4.0–10.3)

## 2009-01-25 ENCOUNTER — Ambulatory Visit: Payer: Self-pay | Admitting: Oncology

## 2009-01-27 LAB — CBC WITH DIFFERENTIAL/PLATELET
Basophils Absolute: 0.2 10*3/uL — ABNORMAL HIGH (ref 0.0–0.1)
Eosinophils Absolute: 0.6 10*3/uL — ABNORMAL HIGH (ref 0.0–0.5)
HGB: 12.8 g/dL — ABNORMAL LOW (ref 13.0–17.1)
MCV: 64.8 fL — ABNORMAL LOW (ref 79.3–98.0)
MONO#: 0.9 10*3/uL (ref 0.1–0.9)
MONO%: 4.2 % (ref 0.0–14.0)
NEUT#: 18.2 10*3/uL — ABNORMAL HIGH (ref 1.5–6.5)
Platelets: 283 10*3/uL (ref 140–400)
RDW: 19.1 % — ABNORMAL HIGH (ref 11.0–14.6)
WBC: 21.1 10*3/uL — ABNORMAL HIGH (ref 4.0–10.3)

## 2009-02-24 ENCOUNTER — Ambulatory Visit: Payer: Self-pay | Admitting: Oncology

## 2009-02-24 LAB — CBC WITH DIFFERENTIAL/PLATELET
Basophils Absolute: 0.2 10*3/uL — ABNORMAL HIGH (ref 0.0–0.1)
EOS%: 3.3 % (ref 0.0–7.0)
Eosinophils Absolute: 0.6 10*3/uL — ABNORMAL HIGH (ref 0.0–0.5)
HGB: 12.9 g/dL — ABNORMAL LOW (ref 13.0–17.1)
NEUT#: 16.2 10*3/uL — ABNORMAL HIGH (ref 1.5–6.5)
RDW: 19.6 % — ABNORMAL HIGH (ref 11.0–14.6)
lymph#: 1.3 10*3/uL (ref 0.9–3.3)

## 2009-03-24 LAB — CBC WITH DIFFERENTIAL/PLATELET
BASO%: 1.1 % (ref 0.0–2.0)
EOS%: 3.1 % (ref 0.0–7.0)
HGB: 13.4 g/dL (ref 13.0–17.1)
MCH: 19.7 pg — ABNORMAL LOW (ref 27.2–33.4)
MCHC: 30 g/dL — ABNORMAL LOW (ref 32.0–36.0)
RBC: 6.8 10*6/uL — ABNORMAL HIGH (ref 4.20–5.82)
RDW: 19.6 % — ABNORMAL HIGH (ref 11.0–14.6)
lymph#: 1.1 10*3/uL (ref 0.9–3.3)

## 2009-04-16 ENCOUNTER — Ambulatory Visit: Payer: Self-pay | Admitting: Oncology

## 2009-04-20 LAB — CBC WITH DIFFERENTIAL/PLATELET
BASO%: 0.1 % (ref 0.0–2.0)
Basophils Absolute: 0 10*3/uL (ref 0.0–0.1)
EOS%: 3.3 % (ref 0.0–7.0)
HCT: 41.4 % (ref 38.4–49.9)
HGB: 12.8 g/dL — ABNORMAL LOW (ref 13.0–17.1)
LYMPH%: 6.4 % — ABNORMAL LOW (ref 14.0–49.0)
MCH: 19.9 pg — ABNORMAL LOW (ref 27.2–33.4)
MCHC: 31 g/dL — ABNORMAL LOW (ref 32.0–36.0)
MCV: 64.3 fL — ABNORMAL LOW (ref 79.3–98.0)
MONO%: 3.2 % (ref 0.0–14.0)
NEUT%: 87 % — ABNORMAL HIGH (ref 39.0–75.0)
Platelets: 278 10*3/uL (ref 140–400)
lymph#: 1.4 10*3/uL (ref 0.9–3.3)

## 2009-04-20 LAB — LACTATE DEHYDROGENASE: LDH: 284 U/L — ABNORMAL HIGH (ref 94–250)

## 2009-04-20 LAB — COMPREHENSIVE METABOLIC PANEL
ALT: 20 U/L (ref 0–53)
AST: 23 U/L (ref 0–37)
Alkaline Phosphatase: 77 U/L (ref 39–117)
BUN: 23 mg/dL (ref 6–23)
Calcium: 8.8 mg/dL (ref 8.4–10.5)
Chloride: 102 mEq/L (ref 96–112)
Creatinine, Ser: 1.24 mg/dL (ref 0.40–1.50)
Total Bilirubin: 0.6 mg/dL (ref 0.3–1.2)

## 2009-05-18 ENCOUNTER — Ambulatory Visit: Payer: Self-pay | Admitting: Oncology

## 2009-05-18 LAB — CBC WITH DIFFERENTIAL/PLATELET
Basophils Absolute: 0.3 10*3/uL — ABNORMAL HIGH (ref 0.0–0.1)
EOS%: 2.8 % (ref 0.0–7.0)
Eosinophils Absolute: 0.6 10*3/uL — ABNORMAL HIGH (ref 0.0–0.5)
HCT: 45.7 % (ref 38.4–49.9)
HGB: 13.5 g/dL (ref 13.0–17.1)
MCH: 19.5 pg — ABNORMAL LOW (ref 27.2–33.4)
MCV: 66.1 fL — ABNORMAL LOW (ref 79.3–98.0)
MONO%: 5.1 % (ref 0.0–14.0)
NEUT#: 17.9 10*3/uL — ABNORMAL HIGH (ref 1.5–6.5)
NEUT%: 84.9 % — ABNORMAL HIGH (ref 39.0–75.0)

## 2009-06-15 LAB — CBC WITH DIFFERENTIAL/PLATELET
Eosinophils Absolute: 0.6 10*3/uL — ABNORMAL HIGH (ref 0.0–0.5)
HCT: 45.8 % (ref 38.4–49.9)
LYMPH%: 6.8 % — ABNORMAL LOW (ref 14.0–49.0)
MCV: 66.8 fL — ABNORMAL LOW (ref 79.3–98.0)
MONO#: 0.8 10*3/uL (ref 0.1–0.9)
MONO%: 4.2 % (ref 0.0–14.0)
NEUT#: 16.4 10*3/uL — ABNORMAL HIGH (ref 1.5–6.5)
NEUT%: 84.5 % — ABNORMAL HIGH (ref 39.0–75.0)
Platelets: 277 10*3/uL (ref 140–400)
RBC: 6.86 10*6/uL — ABNORMAL HIGH (ref 4.20–5.82)
WBC: 19.4 10*3/uL — ABNORMAL HIGH (ref 4.0–10.3)
nRBC: 0 % (ref 0–0)

## 2009-07-08 ENCOUNTER — Ambulatory Visit: Payer: Self-pay | Admitting: Oncology

## 2009-07-19 LAB — CBC WITH DIFFERENTIAL/PLATELET
BASO%: 1.4 % (ref 0.0–2.0)
Basophils Absolute: 0.3 10*3/uL — ABNORMAL HIGH (ref 0.0–0.1)
EOS%: 3.2 % (ref 0.0–7.0)
Eosinophils Absolute: 0.7 10*3/uL — ABNORMAL HIGH (ref 0.0–0.5)
HCT: 45.2 % (ref 38.4–49.9)
HGB: 13.3 g/dL (ref 13.0–17.1)
LYMPH%: 5.9 % — ABNORMAL LOW (ref 14.0–49.0)
MCH: 19.4 pg — ABNORMAL LOW (ref 27.2–33.4)
MCHC: 29.4 g/dL — ABNORMAL LOW (ref 32.0–36.0)
MCV: 66 fL — ABNORMAL LOW (ref 79.3–98.0)
MONO#: 0.9 10*3/uL (ref 0.1–0.9)
MONO%: 4.3 % (ref 0.0–14.0)
NEUT#: 18.7 10*3/uL — ABNORMAL HIGH (ref 1.5–6.5)
NEUT%: 85.2 % — ABNORMAL HIGH (ref 39.0–75.0)
Platelets: 298 10*3/uL (ref 140–400)
RBC: 6.85 10*6/uL — ABNORMAL HIGH (ref 4.20–5.82)
RDW: 19.9 % — ABNORMAL HIGH (ref 11.0–14.6)
WBC: 21.9 10*3/uL — ABNORMAL HIGH (ref 4.0–10.3)
lymph#: 1.3 10*3/uL (ref 0.9–3.3)
nRBC: 0 % (ref 0–0)

## 2009-08-10 ENCOUNTER — Ambulatory Visit: Payer: Self-pay | Admitting: Oncology

## 2009-08-10 LAB — CBC WITH DIFFERENTIAL/PLATELET
BASO%: 1.2 % (ref 0.0–2.0)
Basophils Absolute: 0.3 10*3/uL — ABNORMAL HIGH (ref 0.0–0.1)
EOS%: 2.8 % (ref 0.0–7.0)
Eosinophils Absolute: 0.6 10*3/uL — ABNORMAL HIGH (ref 0.0–0.5)
HCT: 44.4 % (ref 38.4–49.9)
HGB: 13.1 g/dL (ref 13.0–17.1)
LYMPH%: 5.6 % — ABNORMAL LOW (ref 14.0–49.0)
MCH: 19.6 pg — ABNORMAL LOW (ref 27.2–33.4)
MCHC: 29.5 g/dL — ABNORMAL LOW (ref 32.0–36.0)
MCV: 66.4 fL — ABNORMAL LOW (ref 79.3–98.0)
MONO#: 1.1 10*3/uL — ABNORMAL HIGH (ref 0.1–0.9)
MONO%: 5 % (ref 0.0–14.0)
NEUT#: 18.6 10*3/uL — ABNORMAL HIGH (ref 1.5–6.5)
NEUT%: 85.4 % — ABNORMAL HIGH (ref 39.0–75.0)
Platelets: 267 10*3/uL (ref 140–400)
RBC: 6.69 10*6/uL — ABNORMAL HIGH (ref 4.20–5.82)
RDW: 19.6 % — ABNORMAL HIGH (ref 11.0–14.6)
WBC: 21.8 10*3/uL — ABNORMAL HIGH (ref 4.0–10.3)
lymph#: 1.2 10*3/uL (ref 0.9–3.3)
nRBC: 0 % (ref 0–0)

## 2009-09-07 LAB — CBC WITH DIFFERENTIAL/PLATELET
BASO%: 0.4 % (ref 0.0–2.0)
Basophils Absolute: 0.1 10*3/uL (ref 0.0–0.1)
EOS%: 2.5 % (ref 0.0–7.0)
HCT: 41.3 % (ref 38.4–49.9)
HGB: 12.5 g/dL — ABNORMAL LOW (ref 13.0–17.1)
MONO#: 0.8 10*3/uL (ref 0.1–0.9)
NEUT%: 88.4 % — ABNORMAL HIGH (ref 39.0–75.0)
RDW: 20.1 % — ABNORMAL HIGH (ref 11.0–14.6)
WBC: 22.4 10*3/uL — ABNORMAL HIGH (ref 4.0–10.3)
lymph#: 1.2 10*3/uL (ref 0.9–3.3)

## 2009-10-01 ENCOUNTER — Ambulatory Visit: Payer: Self-pay | Admitting: Oncology

## 2009-10-05 LAB — CBC WITH DIFFERENTIAL/PLATELET
BASO%: 1.1 % (ref 0.0–2.0)
Eosinophils Absolute: 0.6 10*3/uL — ABNORMAL HIGH (ref 0.0–0.5)
MCHC: 29 g/dL — ABNORMAL LOW (ref 32.0–36.0)
MONO#: 1.2 10*3/uL — ABNORMAL HIGH (ref 0.1–0.9)
NEUT#: 20.5 10*3/uL — ABNORMAL HIGH (ref 1.5–6.5)
RBC: 6.74 10*6/uL — ABNORMAL HIGH (ref 4.20–5.82)
RDW: 20.2 % — ABNORMAL HIGH (ref 11.0–14.6)
WBC: 23.9 10*3/uL — ABNORMAL HIGH (ref 4.0–10.3)
lymph#: 1.4 10*3/uL (ref 0.9–3.3)
nRBC: 0 % (ref 0–0)

## 2009-11-02 ENCOUNTER — Ambulatory Visit: Payer: Self-pay | Admitting: Oncology

## 2009-11-02 LAB — CBC WITH DIFFERENTIAL/PLATELET
Basophils Absolute: 0.3 10*3/uL — ABNORMAL HIGH (ref 0.0–0.1)
EOS%: 2.5 % (ref 0.0–7.0)
Eosinophils Absolute: 0.9 10*3/uL — ABNORMAL HIGH (ref 0.0–0.5)
HCT: 41.9 % (ref 38.4–49.9)
HGB: 12.6 g/dL — ABNORMAL LOW (ref 13.0–17.1)
MCH: 19.4 pg — ABNORMAL LOW (ref 27.2–33.4)
NEUT#: 33.8 10*3/uL — ABNORMAL HIGH (ref 1.5–6.5)
NEUT%: 88.6 % — ABNORMAL HIGH (ref 39.0–75.0)
lymph#: 1.2 10*3/uL (ref 0.9–3.3)

## 2009-11-02 LAB — COMPREHENSIVE METABOLIC PANEL
Albumin: 4 g/dL (ref 3.5–5.2)
Alkaline Phosphatase: 77 U/L (ref 39–117)
BUN: 26 mg/dL — ABNORMAL HIGH (ref 6–23)
CO2: 22 mEq/L (ref 19–32)
Calcium: 8.9 mg/dL (ref 8.4–10.5)
Chloride: 102 mEq/L (ref 96–112)
Glucose, Bld: 146 mg/dL — ABNORMAL HIGH (ref 70–99)
Potassium: 4.4 mEq/L (ref 3.5–5.3)

## 2009-11-02 LAB — LACTATE DEHYDROGENASE: LDH: 205 U/L (ref 94–250)

## 2009-12-01 LAB — CBC WITH DIFFERENTIAL/PLATELET
Eosinophils Absolute: 0.8 10*3/uL — ABNORMAL HIGH (ref 0.0–0.5)
HCT: 42.5 % (ref 38.4–49.9)
LYMPH%: 6.9 % — ABNORMAL LOW (ref 14.0–49.0)
MONO#: 0.9 10*3/uL (ref 0.1–0.9)
NEUT#: 17.8 10*3/uL — ABNORMAL HIGH (ref 1.5–6.5)
NEUT%: 84.3 % — ABNORMAL HIGH (ref 39.0–75.0)
Platelets: 318 10*3/uL (ref 140–400)
WBC: 21.2 10*3/uL — ABNORMAL HIGH (ref 4.0–10.3)
nRBC: 0 % (ref 0–0)

## 2009-12-27 ENCOUNTER — Ambulatory Visit: Payer: Self-pay | Admitting: Oncology

## 2009-12-29 LAB — CBC WITH DIFFERENTIAL/PLATELET
Basophils Absolute: 0.2 10*3/uL — ABNORMAL HIGH (ref 0.0–0.1)
Eosinophils Absolute: 0.7 10*3/uL — ABNORMAL HIGH (ref 0.0–0.5)
HGB: 13.1 g/dL (ref 13.0–17.1)
MONO#: 1 10*3/uL — ABNORMAL HIGH (ref 0.1–0.9)
NEUT#: 17.7 10*3/uL — ABNORMAL HIGH (ref 1.5–6.5)
RDW: 20.4 % — ABNORMAL HIGH (ref 11.0–14.6)
lymph#: 1.6 10*3/uL (ref 0.9–3.3)

## 2010-01-26 ENCOUNTER — Ambulatory Visit: Payer: Self-pay | Admitting: Oncology

## 2010-01-26 LAB — CBC WITH DIFFERENTIAL/PLATELET
BASO%: 1.1 % (ref 0.0–2.0)
HCT: 43.1 % (ref 38.4–49.9)
MCHC: 29.5 g/dL — ABNORMAL LOW (ref 32.0–36.0)
MONO#: 1 10*3/uL — ABNORMAL HIGH (ref 0.1–0.9)
NEUT%: 85.1 % — ABNORMAL HIGH (ref 39.0–75.0)
RBC: 6.49 10*6/uL — ABNORMAL HIGH (ref 4.20–5.82)
WBC: 23 10*3/uL — ABNORMAL HIGH (ref 4.0–10.3)
lymph#: 1.5 10*3/uL (ref 0.9–3.3)
nRBC: 0 % (ref 0–0)

## 2010-02-15 ENCOUNTER — Ambulatory Visit: Payer: Self-pay | Admitting: Cardiology

## 2010-02-18 ENCOUNTER — Ambulatory Visit: Payer: Self-pay | Admitting: Cardiology

## 2010-02-18 ENCOUNTER — Ambulatory Visit (HOSPITAL_COMMUNITY): Admission: RE | Admit: 2010-02-18 | Discharge: 2010-02-18 | Payer: Self-pay | Admitting: Cardiology

## 2010-02-23 LAB — CBC WITH DIFFERENTIAL/PLATELET
Eosinophils Absolute: 0.6 10*3/uL — ABNORMAL HIGH (ref 0.0–0.5)
MCV: 65.9 fL — ABNORMAL LOW (ref 79.3–98.0)
MONO%: 3.8 % (ref 0.0–14.0)
NEUT#: 21.1 10*3/uL — ABNORMAL HIGH (ref 1.5–6.5)
RBC: 6.27 10*6/uL — ABNORMAL HIGH (ref 4.20–5.82)
RDW: 19.2 % — ABNORMAL HIGH (ref 11.0–14.6)
WBC: 24.2 10*3/uL — ABNORMAL HIGH (ref 4.0–10.3)
lymph#: 1.3 10*3/uL (ref 0.9–3.3)
nRBC: 0 % (ref 0–0)

## 2010-03-23 ENCOUNTER — Ambulatory Visit: Payer: Self-pay | Admitting: Oncology

## 2010-03-23 LAB — CBC WITH DIFFERENTIAL/PLATELET
BASO%: 1.1 % (ref 0.0–2.0)
Basophils Absolute: 0.3 10*3/uL — ABNORMAL HIGH (ref 0.0–0.1)
EOS%: 2.7 % (ref 0.0–7.0)
Eosinophils Absolute: 0.6 10*3/uL — ABNORMAL HIGH (ref 0.0–0.5)
HCT: 43.5 % (ref 38.4–49.9)
HGB: 12.7 g/dL — ABNORMAL LOW (ref 13.0–17.1)
LYMPH%: 5.7 % — ABNORMAL LOW (ref 14.0–49.0)
MCH: 19.2 pg — ABNORMAL LOW (ref 27.2–33.4)
MCHC: 29.2 g/dL — ABNORMAL LOW (ref 32.0–36.0)
MCV: 65.9 fL — ABNORMAL LOW (ref 79.3–98.0)
MONO#: 0.8 10*3/uL (ref 0.1–0.9)
MONO%: 3.7 % (ref 0.0–14.0)
NEUT#: 19.4 10*3/uL — ABNORMAL HIGH (ref 1.5–6.5)
NEUT%: 86.8 % — ABNORMAL HIGH (ref 39.0–75.0)
Platelets: 283 10*3/uL (ref 140–400)
RBC: 6.6 10*6/uL — ABNORMAL HIGH (ref 4.20–5.82)
RDW: 19.7 % — ABNORMAL HIGH (ref 11.0–14.6)
WBC: 22.3 10*3/uL — ABNORMAL HIGH (ref 4.0–10.3)
lymph#: 1.3 10*3/uL (ref 0.9–3.3)
nRBC: 0 % (ref 0–0)

## 2010-04-20 LAB — TECHNOLOGIST REVIEW

## 2010-04-20 LAB — CBC WITH DIFFERENTIAL/PLATELET
BASO%: 0.1 % (ref 0.0–2.0)
Basophils Absolute: 0 10*3/uL (ref 0.0–0.1)
EOS%: 2.4 % (ref 0.0–7.0)
Eosinophils Absolute: 0.6 10*3/uL — ABNORMAL HIGH (ref 0.0–0.5)
HCT: 40 % (ref 38.4–49.9)
HGB: 12.2 g/dL — ABNORMAL LOW (ref 13.0–17.1)
LYMPH%: 5.1 % — ABNORMAL LOW (ref 14.0–49.0)
MCH: 19.6 pg — ABNORMAL LOW (ref 27.2–33.4)
MCHC: 30.6 g/dL — ABNORMAL LOW (ref 32.0–36.0)
MCV: 64.1 fL — ABNORMAL LOW (ref 79.3–98.0)
MONO#: 0.7 10*3/uL (ref 0.1–0.9)
MONO%: 2.6 % (ref 0.0–14.0)
NEUT#: 22.4 10*3/uL — ABNORMAL HIGH (ref 1.5–6.5)
NEUT%: 89.8 % — ABNORMAL HIGH (ref 39.0–75.0)
Platelets: 302 10*3/uL (ref 140–400)
RBC: 6.24 10*6/uL — ABNORMAL HIGH (ref 4.20–5.82)
RDW: 19.9 % — ABNORMAL HIGH (ref 11.0–14.6)
WBC: 24.9 10*3/uL — ABNORMAL HIGH (ref 4.0–10.3)
lymph#: 1.3 10*3/uL (ref 0.9–3.3)

## 2010-04-20 LAB — COMPREHENSIVE METABOLIC PANEL
ALT: 17 U/L (ref 0–53)
AST: 21 U/L (ref 0–37)
Albumin: 4.3 g/dL (ref 3.5–5.2)
Alkaline Phosphatase: 77 U/L (ref 39–117)
BUN: 25 mg/dL — ABNORMAL HIGH (ref 6–23)
CO2: 27 mEq/L (ref 19–32)
Calcium: 9.5 mg/dL (ref 8.4–10.5)
Chloride: 102 mEq/L (ref 96–112)
Creatinine, Ser: 1.17 mg/dL (ref 0.40–1.50)
Glucose, Bld: 82 mg/dL (ref 70–99)
Potassium: 4.5 mEq/L (ref 3.5–5.3)
Sodium: 138 mEq/L (ref 135–145)
Total Bilirubin: 0.6 mg/dL (ref 0.3–1.2)
Total Protein: 5.9 g/dL — ABNORMAL LOW (ref 6.0–8.3)

## 2010-04-20 LAB — LACTATE DEHYDROGENASE: LDH: 284 U/L — ABNORMAL HIGH (ref 94–250)

## 2010-04-20 LAB — URIC ACID: Uric Acid, Serum: 9.9 mg/dL — ABNORMAL HIGH (ref 4.0–7.8)

## 2010-05-16 ENCOUNTER — Ambulatory Visit: Payer: Self-pay | Admitting: Oncology

## 2010-05-20 LAB — CBC WITH DIFFERENTIAL/PLATELET
Basophils Absolute: 0.2 10*3/uL — ABNORMAL HIGH (ref 0.0–0.1)
Eosinophils Absolute: 0.7 10*3/uL — ABNORMAL HIGH (ref 0.0–0.5)
HCT: 40.5 % (ref 38.4–49.9)
HGB: 12 g/dL — ABNORMAL LOW (ref 13.0–17.1)
LYMPH%: 4.8 % — ABNORMAL LOW (ref 14.0–49.0)
MCV: 64.7 fL — ABNORMAL LOW (ref 79.3–98.0)
MONO#: 1.2 10*3/uL — ABNORMAL HIGH (ref 0.1–0.9)
MONO%: 4.4 % (ref 0.0–14.0)
NEUT#: 22.9 10*3/uL — ABNORMAL HIGH (ref 1.5–6.5)
NEUT%: 87.5 % — ABNORMAL HIGH (ref 39.0–75.0)
Platelets: 278 10*3/uL (ref 140–400)
WBC: 26.2 10*3/uL — ABNORMAL HIGH (ref 4.0–10.3)
nRBC: 0 % (ref 0–0)

## 2010-06-15 ENCOUNTER — Ambulatory Visit: Payer: Self-pay | Admitting: Oncology

## 2010-06-15 LAB — CBC WITH DIFFERENTIAL/PLATELET
Eosinophils Absolute: 0.7 10*3/uL — ABNORMAL HIGH (ref 0.0–0.5)
HCT: 43.5 % (ref 38.4–49.9)
LYMPH%: 5.2 % — ABNORMAL LOW (ref 14.0–49.0)
MONO#: 1 10*3/uL — ABNORMAL HIGH (ref 0.1–0.9)
NEUT#: 20.5 10*3/uL — ABNORMAL HIGH (ref 1.5–6.5)
NEUT%: 86.4 % — ABNORMAL HIGH (ref 39.0–75.0)
Platelets: 280 10*3/uL (ref 140–400)
RBC: 6.68 10*6/uL — ABNORMAL HIGH (ref 4.20–5.82)
WBC: 23.7 10*3/uL — ABNORMAL HIGH (ref 4.0–10.3)
lymph#: 1.2 10*3/uL (ref 0.9–3.3)

## 2010-07-13 ENCOUNTER — Ambulatory Visit: Payer: Self-pay | Admitting: Oncology

## 2010-07-13 LAB — CBC WITH DIFFERENTIAL/PLATELET
Basophils Absolute: 0.3 10*3/uL — ABNORMAL HIGH (ref 0.0–0.1)
EOS%: 3.4 % (ref 0.0–7.0)
Eosinophils Absolute: 0.8 10*3/uL — ABNORMAL HIGH (ref 0.0–0.5)
HCT: 41.5 % (ref 38.4–49.9)
HGB: 12.4 g/dL — ABNORMAL LOW (ref 13.0–17.1)
MCH: 19.3 pg — ABNORMAL LOW (ref 27.2–33.4)
MCV: 64.6 fL — ABNORMAL LOW (ref 79.3–98.0)
NEUT#: 20.7 10*3/uL — ABNORMAL HIGH (ref 1.5–6.5)
NEUT%: 84.6 % — ABNORMAL HIGH (ref 39.0–75.0)
RDW: 19.6 % — ABNORMAL HIGH (ref 11.0–14.6)
lymph#: 1.4 10*3/uL (ref 0.9–3.3)

## 2010-08-10 LAB — CBC WITH DIFFERENTIAL/PLATELET
BASO%: 0.9 % (ref 0.0–2.0)
Basophils Absolute: 0.2 10*3/uL — ABNORMAL HIGH (ref 0.0–0.1)
EOS%: 2.7 % (ref 0.0–7.0)
Eosinophils Absolute: 0.7 10*3/uL — ABNORMAL HIGH (ref 0.0–0.5)
HCT: 44.3 % (ref 38.4–49.9)
HGB: 13.2 g/dL (ref 13.0–17.1)
LYMPH%: 5.4 % — ABNORMAL LOW (ref 14.0–49.0)
MCH: 19.4 pg — ABNORMAL LOW (ref 27.2–33.4)
MCHC: 29.8 g/dL — ABNORMAL LOW (ref 32.0–36.0)
MCV: 65.1 fL — ABNORMAL LOW (ref 79.3–98.0)
MONO#: 1.1 10*3/uL — ABNORMAL HIGH (ref 0.1–0.9)
MONO%: 4.4 % (ref 0.0–14.0)
NEUT#: 21.4 10*3/uL — ABNORMAL HIGH (ref 1.5–6.5)
NEUT%: 86.6 % — ABNORMAL HIGH (ref 39.0–75.0)
Platelets: 258 10*3/uL (ref 140–400)
RBC: 6.8 10*6/uL — ABNORMAL HIGH (ref 4.20–5.82)
RDW: 19.8 % — ABNORMAL HIGH (ref 11.0–14.6)
WBC: 24.7 10*3/uL — ABNORMAL HIGH (ref 4.0–10.3)
lymph#: 1.3 10*3/uL (ref 0.9–3.3)
nRBC: 0 % (ref 0–0)

## 2010-09-07 ENCOUNTER — Other Ambulatory Visit (HOSPITAL_COMMUNITY): Payer: Self-pay | Admitting: Oncology

## 2010-09-07 ENCOUNTER — Encounter (HOSPITAL_BASED_OUTPATIENT_CLINIC_OR_DEPARTMENT_OTHER): Payer: Medicare Other | Admitting: Oncology

## 2010-09-07 DIAGNOSIS — D45 Polycythemia vera: Secondary | ICD-10-CM

## 2010-09-07 LAB — CBC WITH DIFFERENTIAL/PLATELET
BASO%: 1 % (ref 0.0–2.0)
Basophils Absolute: 0.3 10*3/uL — ABNORMAL HIGH (ref 0.0–0.1)
HCT: 43.4 % (ref 38.4–49.9)
HGB: 12.8 g/dL — ABNORMAL LOW (ref 13.0–17.1)
LYMPH%: 5.6 % — ABNORMAL LOW (ref 14.0–49.0)
MCHC: 29.5 g/dL — ABNORMAL LOW (ref 32.0–36.0)
MONO#: 1.4 10*3/uL — ABNORMAL HIGH (ref 0.1–0.9)
NEUT%: 85.8 % — ABNORMAL HIGH (ref 39.0–75.0)
Platelets: 264 10*3/uL (ref 140–400)
WBC: 27.8 10*3/uL — ABNORMAL HIGH (ref 4.0–10.3)
lymph#: 1.6 10*3/uL (ref 0.9–3.3)

## 2010-10-05 ENCOUNTER — Encounter (HOSPITAL_BASED_OUTPATIENT_CLINIC_OR_DEPARTMENT_OTHER): Payer: Medicare Other | Admitting: Oncology

## 2010-10-05 ENCOUNTER — Other Ambulatory Visit (HOSPITAL_COMMUNITY): Payer: Self-pay | Admitting: Oncology

## 2010-10-05 DIAGNOSIS — D45 Polycythemia vera: Secondary | ICD-10-CM

## 2010-10-05 LAB — CBC WITH DIFFERENTIAL/PLATELET
Basophils Absolute: 0.3 10*3/uL — ABNORMAL HIGH (ref 0.0–0.1)
EOS%: 2.7 % (ref 0.0–7.0)
Eosinophils Absolute: 0.7 10*3/uL — ABNORMAL HIGH (ref 0.0–0.5)
HCT: 41.7 % (ref 38.4–49.9)
HGB: 12.3 g/dL — ABNORMAL LOW (ref 13.0–17.1)
LYMPH%: 6.6 % — ABNORMAL LOW (ref 14.0–49.0)
MCH: 19.1 pg — ABNORMAL LOW (ref 27.2–33.4)
MCV: 64.8 fL — ABNORMAL LOW (ref 79.3–98.0)
MONO%: 3.7 % (ref 0.0–14.0)
NEUT#: 22 10*3/uL — ABNORMAL HIGH (ref 1.5–6.5)
NEUT%: 85.9 % — ABNORMAL HIGH (ref 39.0–75.0)
Platelets: 273 10*3/uL (ref 140–400)

## 2010-11-03 ENCOUNTER — Other Ambulatory Visit (HOSPITAL_COMMUNITY): Payer: Self-pay | Admitting: Oncology

## 2010-11-03 ENCOUNTER — Encounter (HOSPITAL_BASED_OUTPATIENT_CLINIC_OR_DEPARTMENT_OTHER): Payer: Medicare Other | Admitting: Oncology

## 2010-11-03 DIAGNOSIS — D45 Polycythemia vera: Secondary | ICD-10-CM

## 2010-11-03 LAB — COMPREHENSIVE METABOLIC PANEL
ALT: 19 U/L (ref 0–53)
Alkaline Phosphatase: 80 U/L (ref 39–117)
CO2: 26 mEq/L (ref 19–32)
Creatinine, Ser: 1.22 mg/dL (ref 0.40–1.50)
Glucose, Bld: 66 mg/dL — ABNORMAL LOW (ref 70–99)
Sodium: 139 mEq/L (ref 135–145)
Total Bilirubin: 0.6 mg/dL (ref 0.3–1.2)

## 2010-11-03 LAB — FERRITIN: Ferritin: 8 ng/mL — ABNORMAL LOW (ref 22–322)

## 2010-11-03 LAB — CBC WITH DIFFERENTIAL/PLATELET
BASO%: 0 % (ref 0.0–2.0)
HCT: 42.5 % (ref 38.4–49.9)
LYMPH%: 6.6 % — ABNORMAL LOW (ref 14.0–49.0)
MCHC: 30.1 g/dL — ABNORMAL LOW (ref 32.0–36.0)
MCV: 64.4 fL — ABNORMAL LOW (ref 79.3–98.0)
MONO#: 1 10*3/uL — ABNORMAL HIGH (ref 0.1–0.9)
MONO%: 4 % (ref 0.0–14.0)
NEUT%: 87.3 % — ABNORMAL HIGH (ref 39.0–75.0)
Platelets: 254 10*3/uL (ref 140–400)
RBC: 6.59 10*6/uL — ABNORMAL HIGH (ref 4.20–5.82)
WBC: 26.1 10*3/uL — ABNORMAL HIGH (ref 4.0–10.3)

## 2010-11-03 LAB — LACTATE DEHYDROGENASE: LDH: 289 U/L — ABNORMAL HIGH (ref 94–250)

## 2010-11-03 LAB — IRON AND TIBC: Iron: <10 ug/dL — ABNORMAL LOW (ref 42–165)

## 2010-11-29 NOTE — Op Note (Signed)
NAME:  GUILLAUME, WENINGER               ACCOUNT NO.:  [id number]   MEDICAL RECORD NO.:  [id number]          PATIENT TYPE:  AMB   LOCATION:  ENDO                         FACILITY:  [facility]   PHYSICIAN:  Petra Kuba, M.D.    DATE OF BIRTH:  19-Jul-1923   DATE OF PROCEDURE:  08/11/2008  DATE OF DISCHARGE:                               OPERATIVE REPORT   PROCEDURE:  Colonoscopy.   INDICATIONS FOR PROCEDURE:  The patient with a history of colon polyps,  due for repeat screening.  Consent was signed after risks, benefits,  methods, options thoroughly discussed multiple times in the past.   MEDICINES USED:  Fentanyl 100 mcg, Versed 10 mg.   DESCRIPTION OF PROCEDURE:  Rectal inspection was pertinent for external  hemorrhoids, small.  Digital exam was negative.  The video pediatric  colonoscope was inserted, very easily advanced around the colon to the  cecum.  This did require some abdominal pressure and position changes.  On insertion some scattered diverticula were seen but no other  abnormalities.  The cecum was identified by the appendiceal orifice and  ileocecal valve.  In fact, the scope was inserted a short ways of the  terminal ileum which was normal.  Photograph documentation was obtained.  The scope was slowly withdrawn.  Slow withdrawal through the colon. Prep  was adequate.  There was some liquid stool that required washing and  suctioning.  The diverticula began in the transverse and a few scattered  throughout the left side of the colon.  No polyps, tumors or masses were  seen as we slowly withdrew back to the rectum.  Once back in the rectum,  anal rectal pull through and retroflexion confirmed some small  hemorrhoids.  The scope was tried to readvance to the left side of the  colon and re-suctioned.  The scope removed.  The patient tolerated the  procedure well.  There were no immediate complications.   ENDOSCOPIC DIAGNOSIS:  1. Internal and external hemorrhoids.  2. Scattered  few diverticula, left side and transverse.  3. Otherwise within normal limits to the terminal ileum.   PLAN:  I doubt based on his age he needs any further colonic screening.  I will be happy to see back p.r.n.  Return care to Dr. Joelene Millin for  customary health care screening and maintenance.           ______________________________  Petra Kuba, M.D.     MEM/MEDQ  D:  08/11/2008  T:  08/11/2008  Job:  [id number]

## 2011-05-12 ENCOUNTER — Other Ambulatory Visit (HOSPITAL_COMMUNITY): Payer: Self-pay | Admitting: Oncology

## 2011-05-12 ENCOUNTER — Telehealth: Payer: Self-pay | Admitting: Oncology

## 2011-05-12 ENCOUNTER — Encounter (HOSPITAL_BASED_OUTPATIENT_CLINIC_OR_DEPARTMENT_OTHER): Payer: Medicare Other | Admitting: Oncology

## 2011-05-12 DIAGNOSIS — D45 Polycythemia vera: Secondary | ICD-10-CM

## 2011-05-12 DIAGNOSIS — R5381 Other malaise: Secondary | ICD-10-CM

## 2011-05-12 DIAGNOSIS — R21 Rash and other nonspecific skin eruption: Secondary | ICD-10-CM

## 2011-05-12 LAB — CBC WITH DIFFERENTIAL/PLATELET
EOS%: 0.9 % (ref 0.0–7.0)
Eosinophils Absolute: 0.1 10*3/uL (ref 0.0–0.5)
MCV: 88.3 fL (ref 79.3–98.0)
MONO%: 6.1 % (ref 0.0–14.0)
NEUT#: 10.1 10*3/uL — ABNORMAL HIGH (ref 1.5–6.5)
RBC: 5.57 10*6/uL (ref 4.20–5.82)
RDW: 18.1 % — ABNORMAL HIGH (ref 11.0–14.6)
WBC: 12.4 10*3/uL — ABNORMAL HIGH (ref 4.0–10.3)
nRBC: 0 % (ref 0–0)

## 2011-05-12 LAB — COMPREHENSIVE METABOLIC PANEL
Albumin: 4.2 g/dL (ref 3.5–5.2)
BUN: 24 mg/dL — ABNORMAL HIGH (ref 6–23)
CO2: 26 mEq/L (ref 19–32)
Calcium: 9.2 mg/dL (ref 8.4–10.5)
Glucose, Bld: 74 mg/dL (ref 70–99)
Potassium: 4.7 mEq/L (ref 3.5–5.3)
Sodium: 139 mEq/L (ref 135–145)
Total Protein: 6 g/dL (ref 6.0–8.3)

## 2011-05-12 LAB — LACTATE DEHYDROGENASE: LDH: 208 U/L (ref 94–250)

## 2011-05-12 LAB — URIC ACID: Uric Acid, Serum: 5.1 mg/dL (ref 4.0–7.8)

## 2011-05-12 NOTE — Telephone Encounter (Signed)
gv pt appt schedule for nov thru feb. °

## 2011-05-30 ENCOUNTER — Other Ambulatory Visit: Payer: Self-pay | Admitting: Oncology

## 2011-05-30 DIAGNOSIS — D45 Polycythemia vera: Secondary | ICD-10-CM | POA: Insufficient documentation

## 2011-05-31 ENCOUNTER — Other Ambulatory Visit: Payer: Self-pay | Admitting: Oncology

## 2011-05-31 ENCOUNTER — Telehealth: Payer: Self-pay | Admitting: *Deleted

## 2011-06-01 ENCOUNTER — Other Ambulatory Visit (HOSPITAL_COMMUNITY): Payer: Self-pay | Admitting: Oncology

## 2011-06-01 ENCOUNTER — Other Ambulatory Visit (HOSPITAL_BASED_OUTPATIENT_CLINIC_OR_DEPARTMENT_OTHER): Payer: Medicare Other | Admitting: Lab

## 2011-06-01 ENCOUNTER — Ambulatory Visit (HOSPITAL_BASED_OUTPATIENT_CLINIC_OR_DEPARTMENT_OTHER): Payer: Medicare Other

## 2011-06-01 ENCOUNTER — Other Ambulatory Visit: Payer: Self-pay | Admitting: Oncology

## 2011-06-01 ENCOUNTER — Other Ambulatory Visit: Payer: Self-pay | Admitting: Medical Oncology

## 2011-06-01 DIAGNOSIS — D45 Polycythemia vera: Secondary | ICD-10-CM

## 2011-06-01 LAB — CBC WITH DIFFERENTIAL/PLATELET
Eosinophils Absolute: 0.2 10*3/uL (ref 0.0–0.5)
HCT: 46.6 % (ref 38.4–49.9)
LYMPH%: 9.7 % — ABNORMAL LOW (ref 14.0–49.0)
MCHC: 30.7 g/dL — ABNORMAL LOW (ref 32.0–36.0)
MCV: 90.1 fL (ref 79.3–98.0)
MONO%: 7.2 % (ref 0.0–14.0)
NEUT#: 9 10*3/uL — ABNORMAL HIGH (ref 1.5–6.5)
NEUT%: 80.2 % — ABNORMAL HIGH (ref 39.0–75.0)
Platelets: 132 10*3/uL — ABNORMAL LOW (ref 140–400)
RBC: 5.17 10*6/uL (ref 4.20–5.82)
nRBC: 0 % (ref 0–0)

## 2011-06-01 NOTE — Progress Notes (Signed)
Phlebotomy: obtained from right antecubital 1430-1435. Pt tolerated well. Site unremarkable

## 2011-07-06 ENCOUNTER — Other Ambulatory Visit (HOSPITAL_COMMUNITY): Payer: Self-pay | Admitting: Oncology

## 2011-07-06 ENCOUNTER — Ambulatory Visit (HOSPITAL_BASED_OUTPATIENT_CLINIC_OR_DEPARTMENT_OTHER): Payer: Medicare Other

## 2011-07-06 ENCOUNTER — Other Ambulatory Visit (HOSPITAL_BASED_OUTPATIENT_CLINIC_OR_DEPARTMENT_OTHER): Payer: Medicare Other | Admitting: Lab

## 2011-07-06 ENCOUNTER — Other Ambulatory Visit: Payer: Self-pay | Admitting: Oncology

## 2011-07-06 DIAGNOSIS — D45 Polycythemia vera: Secondary | ICD-10-CM

## 2011-07-06 DIAGNOSIS — R5383 Other fatigue: Secondary | ICD-10-CM

## 2011-07-06 DIAGNOSIS — R5381 Other malaise: Secondary | ICD-10-CM

## 2011-07-06 LAB — CBC WITH DIFFERENTIAL/PLATELET
BASO%: 1.3 % (ref 0.0–2.0)
Basophils Absolute: 0.2 10*3/uL — ABNORMAL HIGH (ref 0.0–0.1)
EOS%: 1.5 % (ref 0.0–7.0)
HCT: 47 % (ref 38.4–49.9)
LYMPH%: 9 % — ABNORMAL LOW (ref 14.0–49.0)
MCH: 27.4 pg (ref 27.2–33.4)
MCHC: 30.6 g/dL — ABNORMAL LOW (ref 32.0–36.0)
MCV: 89.4 fL (ref 79.3–98.0)
MONO%: 5.8 % (ref 0.0–14.0)
NEUT%: 82.4 % — ABNORMAL HIGH (ref 39.0–75.0)
lymph#: 1.4 10*3/uL (ref 0.9–3.3)

## 2011-07-09 ENCOUNTER — Other Ambulatory Visit: Payer: Self-pay | Admitting: Oncology

## 2011-07-09 ENCOUNTER — Encounter (HOSPITAL_COMMUNITY): Payer: Self-pay | Admitting: Oncology

## 2011-07-09 DIAGNOSIS — D45 Polycythemia vera: Secondary | ICD-10-CM

## 2011-07-09 NOTE — Progress Notes (Unsigned)
Pt seen for appt 05/12/11, has had 1 unit phlebotomy on 10/26, 11/15 and most recently on 12/20 at which time HCT was 47%.  Pt on HU 500 mg/day--was lowered due to mild thrombocytopenia.  On monthly CBCs and phlebotomies to keep HCT </= 43%.  Am adding an extra CBC/phlebotomy for 07/20/11.  Pt due to see me in late Feb.

## 2011-07-10 ENCOUNTER — Telehealth: Payer: Self-pay | Admitting: Oncology

## 2011-07-10 NOTE — Telephone Encounter (Signed)
spoke with pt and was advised of 07/20/2011 appts    aom

## 2011-07-20 ENCOUNTER — Other Ambulatory Visit (HOSPITAL_BASED_OUTPATIENT_CLINIC_OR_DEPARTMENT_OTHER): Payer: Medicare Other | Admitting: Lab

## 2011-07-20 ENCOUNTER — Ambulatory Visit (HOSPITAL_BASED_OUTPATIENT_CLINIC_OR_DEPARTMENT_OTHER): Payer: Medicare Other

## 2011-07-20 DIAGNOSIS — D45 Polycythemia vera: Secondary | ICD-10-CM

## 2011-07-20 LAB — CBC WITH DIFFERENTIAL/PLATELET
BASO%: 1.6 % (ref 0.0–2.0)
EOS%: 1.7 % (ref 0.0–7.0)
LYMPH%: 7.5 % — ABNORMAL LOW (ref 14.0–49.0)
MCH: 27.2 pg (ref 27.2–33.4)
MCHC: 30.9 g/dL — ABNORMAL LOW (ref 32.0–36.0)
MCV: 88.1 fL (ref 79.3–98.0)
MONO%: 5.9 % (ref 0.0–14.0)
NEUT#: 14 10*3/uL — ABNORMAL HIGH (ref 1.5–6.5)
Platelets: 188 10*3/uL (ref 140–400)
RBC: 5.14 10*6/uL (ref 4.20–5.82)
RDW: 14.5 % (ref 11.0–14.6)
nRBC: 0 % (ref 0–0)

## 2011-07-20 NOTE — Progress Notes (Signed)
530 grams removed during phlebotomy treatment. Patient tolerated well. Patient given crackers and drink. Encouraged to drink plenty of fluids.

## 2011-08-02 ENCOUNTER — Other Ambulatory Visit: Payer: Self-pay

## 2011-08-02 ENCOUNTER — Other Ambulatory Visit: Payer: Self-pay | Admitting: Oncology

## 2011-08-02 DIAGNOSIS — D45 Polycythemia vera: Secondary | ICD-10-CM

## 2011-08-03 ENCOUNTER — Other Ambulatory Visit (HOSPITAL_BASED_OUTPATIENT_CLINIC_OR_DEPARTMENT_OTHER): Payer: Medicare Other

## 2011-08-03 ENCOUNTER — Ambulatory Visit: Payer: Medicare Other

## 2011-08-03 DIAGNOSIS — D45 Polycythemia vera: Secondary | ICD-10-CM | POA: Diagnosis not present

## 2011-08-03 LAB — CBC WITH DIFFERENTIAL/PLATELET
BASO%: 0.1 % (ref 0.0–2.0)
EOS%: 2 % (ref 0.0–7.0)
HCT: 42.6 % (ref 38.4–49.9)
LYMPH%: 7.8 % — ABNORMAL LOW (ref 14.0–49.0)
MCH: 27.5 pg (ref 27.2–33.4)
MCHC: 31.7 g/dL — ABNORMAL LOW (ref 32.0–36.0)
MONO#: 0.3 10*3/uL (ref 0.1–0.9)
NEUT%: 88.4 % — ABNORMAL HIGH (ref 39.0–75.0)
Platelets: 180 10*3/uL (ref 140–400)

## 2011-08-03 NOTE — Progress Notes (Signed)
Spoke with patient in waiting area and informed him that his hematocrit was 42.6.  Per Dr. Mamie Levers notes, no phlebotomy needed if less than 43%.  Patient states that he felt good and was pleased with not having to receive phlebotomy.  Instructed patient to call with any concerns before next appointment.  Patient aware of next appointment.

## 2011-08-18 ENCOUNTER — Encounter (HOSPITAL_COMMUNITY)
Admission: RE | Admit: 2011-08-18 | Discharge: 2011-08-18 | Disposition: A | Discharge status: Home / Self Care | Payer: Medicare Other | Source: Ambulatory Visit | Attending: Oncology | Admitting: Oncology

## 2011-09-05 DIAGNOSIS — L259 Unspecified contact dermatitis, unspecified cause: Secondary | ICD-10-CM | POA: Diagnosis not present

## 2011-09-07 ENCOUNTER — Other Ambulatory Visit: Payer: Self-pay | Admitting: *Deleted

## 2011-09-07 ENCOUNTER — Ambulatory Visit: Payer: Medicare Other

## 2011-09-07 ENCOUNTER — Encounter: Payer: Self-pay | Admitting: Oncology

## 2011-09-07 ENCOUNTER — Ambulatory Visit (HOSPITAL_BASED_OUTPATIENT_CLINIC_OR_DEPARTMENT_OTHER): Payer: Medicare Other | Admitting: Oncology

## 2011-09-07 ENCOUNTER — Other Ambulatory Visit: Payer: Medicare Other | Admitting: Lab

## 2011-09-07 VITALS — BP 126/72 | HR 80 | Temp 97.7°F | Ht 69.5 in | Wt 210.1 lb

## 2011-09-07 DIAGNOSIS — M109 Gout, unspecified: Secondary | ICD-10-CM | POA: Diagnosis not present

## 2011-09-07 DIAGNOSIS — D45 Polycythemia vera: Secondary | ICD-10-CM | POA: Diagnosis not present

## 2011-09-07 DIAGNOSIS — I10 Essential (primary) hypertension: Secondary | ICD-10-CM | POA: Diagnosis not present

## 2011-09-07 DIAGNOSIS — Z79899 Other long term (current) drug therapy: Secondary | ICD-10-CM | POA: Diagnosis not present

## 2011-09-07 DIAGNOSIS — R5381 Other malaise: Secondary | ICD-10-CM | POA: Diagnosis not present

## 2011-09-07 DIAGNOSIS — E785 Hyperlipidemia, unspecified: Secondary | ICD-10-CM | POA: Diagnosis not present

## 2011-09-07 DIAGNOSIS — Z125 Encounter for screening for malignant neoplasm of prostate: Secondary | ICD-10-CM | POA: Diagnosis not present

## 2011-09-07 LAB — CBC WITH DIFFERENTIAL/PLATELET
Basophils Absolute: 0.1 10*3/uL (ref 0.0–0.1)
Eosinophils Absolute: 0.2 10*3/uL (ref 0.0–0.5)
HCT: 45.1 % (ref 38.4–49.9)
LYMPH%: 6.4 % — ABNORMAL LOW (ref 14.0–49.0)
MCV: 85.9 fL (ref 79.3–98.0)
MONO%: 3.9 % (ref 0.0–14.0)
NEUT#: 14.9 10*3/uL — ABNORMAL HIGH (ref 1.5–6.5)
NEUT%: 88 % — ABNORMAL HIGH (ref 39.0–75.0)
Platelets: 161 10*3/uL (ref 140–400)
RBC: 5.25 10*6/uL (ref 4.20–5.82)

## 2011-09-07 LAB — COMPREHENSIVE METABOLIC PANEL
AST: 24 U/L (ref 0–37)
Albumin: 4.1 g/dL (ref 3.5–5.2)
BUN: 29 mg/dL — ABNORMAL HIGH (ref 6–23)
Calcium: 9.3 mg/dL (ref 8.4–10.5)
Chloride: 107 mEq/L (ref 96–112)
Glucose, Bld: 71 mg/dL (ref 70–99)
Potassium: 4.3 mEq/L (ref 3.5–5.3)
Total Protein: 6.1 g/dL (ref 6.0–8.3)

## 2011-09-07 LAB — LACTATE DEHYDROGENASE: LDH: 227 U/L (ref 94–250)

## 2011-09-07 NOTE — Progress Notes (Signed)
This office note has been dictated.  #[id number]

## 2011-09-07 NOTE — Progress Notes (Signed)
CC:   Larina Earthly, M.D.  PROBLEM LIST: 1. Polycythemia vera dating back to 1997, JAK2 mutation positive,     currently being managed with phlebotomy and hydroxyurea. 2. Hypertension. 3. BPH. 4. History of reactive hypoglycemia. 5. History of depression and panic attacks. 6. Systolic ejection murmur. 7. Gout. 8. Eczema.  MEDICATIONS: 1. Allopurinol 200 mg daily. 2. Lotrel 5 to 10 mg 1 daily. 3. Aspirin half of a 325-mg tablet daily. 4. Fish oil omega-3 fatty acids 1000 mg daily. 5. Hydroxyurea 500 mg daily. 6. Multivitamins 1 daily. 7. Hytrin 5 mg at bedtime. 8. Vitamin E 400 units daily.  HISTORY:  [name] [name] is an 78 year old gentleman followed by [place] for polycythemia vera with a JAK2 mutation present, currently managed with phlebotomy and hydroxyurea.  [name] [name] was last seen by [place] on 05/12/2011, at which time his hematocrit was 49.2.  Our threshold for phlebotomy is 43%.  [name] [name] has undergone 1 unit phlebotomies on 10/26, 11/15, 12/20, 07/20/2011.  Today his hematocrit is 45.1 and he will have another 1-unit phlebotomy.  The patient fortunately has not had any complications related to his p. vera.  He feels generally well, although his main complaint is lack of energy.  He feels like he is slowing down.  He has had within the past year or 2 a couple of gout attacks involving his foot, I believe his left foot.  He has also had some eczema.  He denies any pruritus.  After he takes a shower, he does notice a red rash.  He is without any specific complaints today.  He remains generally active.  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  It will be recalled that the patient at one time was on hydroxyurea capsules 11 per week.  When we saw him on 05/12/2011, his platelet count was 113,000 and we cut back to 7 a week, i.e., 500 mg daily.  PHYSICAL EXAMINATION:  General:  The patient looks well, certainly younger and more fit than his stated age of 30.  Vital Signs:  Weight is 210  pounds.  The patient's weight has increased slightly over the past several months.  At 1 time his weight used to be between 190 and 200 pounds.  Height 5 feet 9-1/2 inches, body surface area 2.16 sq m.  Blood pressure 126/72.  Other vital signs are normal.  HEENT:  There is no scleral icterus.  Mouth and pharynx are benign.  There is no peripheral adenopathy palpable.  Lungs:  Clear to percussion and auscultation. Cardiac Exam:  Regular rhythm with systolic ejection murmur.  Breasts: Somewhat prominent with possible gynecomastia bilaterally.  Abdomen: Benign with no organomegaly or masses palpable.  No obvious splenomegaly.  Extremities:  Puffy ankles bilaterally.  He does have some patchy erythematous areas over his arms, consistent with eczema. Neurologic Exam:  Grossly normal.  LABORATORY DATA:  Today white count 16.9, ANC 14.9, hemoglobin 14.2, hematocrit 45.1, platelets 161,000.  Differential is normal. Chemistries today are pending.  Chemistries from 05/12/2011 were essentially normal except for a BUN of 24, creatinine 1.20.  Albumin was 4.2, uric acid 5.1.  Liver function tests including LDH were normal.  IMAGING STUDIES:  Ultrasound of the abdomen from 08/06/2001:  Spleen was thought to be within the upper limits of normal with no focal abnormality.  Spleen measured 135.6 x 66.1 mm.  IMPRESSION AND PLAN:  [name] [name] continues to do generally well.  We will go ahead with the 1-unit phlebotomy today.  The patient will have CBCs and possible phlebotomies for hematocrit greater than or equal to 43% every 3 weeks.  Those dates coincide as follows:  03/14, 04/04, 04/25, 05/16, 06/06, 06/27.  We will plan to see [name] [name] again in 4 months around July 18th, at which time we will check CBC, chemistries, uric acid, and LDH.  He is also scheduled for phlebotomy on the 18th should he need it.  He has been scheduled for phlebotomies on the previous dates with a CBC every 3  weeks.    ______________________________ Samul Dada, M.D. DSM/MEDQ  D:  09/07/2011  T:  09/07/2011  Job:  [id number]

## 2011-09-07 NOTE — Progress Notes (Signed)
Phlebotomy of 520 cc from right anticubital vein in approximately 10 minutes. Tolerated well. Provided snacks

## 2011-09-07 NOTE — Patient Instructions (Signed)

## 2011-09-13 DIAGNOSIS — I10 Essential (primary) hypertension: Secondary | ICD-10-CM | POA: Diagnosis not present

## 2011-09-13 DIAGNOSIS — Z125 Encounter for screening for malignant neoplasm of prostate: Secondary | ICD-10-CM | POA: Diagnosis not present

## 2011-09-13 DIAGNOSIS — M109 Gout, unspecified: Secondary | ICD-10-CM | POA: Diagnosis not present

## 2011-09-13 DIAGNOSIS — Z0000 Encounter for general adult medical examination without abnormal findings: Secondary | ICD-10-CM | POA: Diagnosis not present

## 2011-09-26 DIAGNOSIS — I359 Nonrheumatic aortic valve disorder, unspecified: Secondary | ICD-10-CM | POA: Diagnosis not present

## 2011-09-28 ENCOUNTER — Ambulatory Visit (HOSPITAL_BASED_OUTPATIENT_CLINIC_OR_DEPARTMENT_OTHER): Payer: Medicare Other

## 2011-09-28 ENCOUNTER — Other Ambulatory Visit (HOSPITAL_BASED_OUTPATIENT_CLINIC_OR_DEPARTMENT_OTHER): Payer: Medicare Other | Admitting: Lab

## 2011-09-28 DIAGNOSIS — D45 Polycythemia vera: Secondary | ICD-10-CM

## 2011-09-28 LAB — CBC WITH DIFFERENTIAL/PLATELET
Basophils Absolute: 0.2 10*3/uL — ABNORMAL HIGH (ref 0.0–0.1)
Eosinophils Absolute: 0.2 10*3/uL (ref 0.0–0.5)
HCT: 43.6 % (ref 38.4–49.9)
HGB: 13.2 g/dL (ref 13.0–17.1)
MCH: 25.6 pg — ABNORMAL LOW (ref 27.2–33.4)
MCV: 84.5 fL (ref 79.3–98.0)
NEUT#: 15.2 10*3/uL — ABNORMAL HIGH (ref 1.5–6.5)
NEUT%: 86.2 % — ABNORMAL HIGH (ref 39.0–75.0)
RDW: 15.5 % — ABNORMAL HIGH (ref 11.0–14.6)
lymph#: 1.2 10*3/uL (ref 0.9–3.3)

## 2011-09-28 LAB — TECHNOLOGIST REVIEW

## 2011-09-28 NOTE — Progress Notes (Signed)
Pt arrived for phlebotomy, vss, 500 grams removed with two sticks. Patient tolderated procedure well. Snacks given. 1618 VSS. Pt understands to drink plenty of fluids and eat a healthy meal this evening. DS.

## 2011-10-13 DIAGNOSIS — Z961 Presence of intraocular lens: Secondary | ICD-10-CM | POA: Diagnosis not present

## 2011-10-19 ENCOUNTER — Ambulatory Visit: Payer: Medicare Other

## 2011-10-19 ENCOUNTER — Telehealth: Payer: Self-pay

## 2011-10-19 ENCOUNTER — Other Ambulatory Visit (HOSPITAL_BASED_OUTPATIENT_CLINIC_OR_DEPARTMENT_OTHER): Payer: Medicare Other | Admitting: Lab

## 2011-10-19 DIAGNOSIS — D45 Polycythemia vera: Secondary | ICD-10-CM

## 2011-10-19 LAB — CBC WITH DIFFERENTIAL/PLATELET
BASO%: 1.5 % (ref 0.0–2.0)
Basophils Absolute: 0.2 10*3/uL — ABNORMAL HIGH (ref 0.0–0.1)
Eosinophils Absolute: 0.3 10*3/uL (ref 0.0–0.5)
HCT: 42.8 % (ref 38.4–49.9)
HGB: 13.1 g/dL (ref 13.0–17.1)
LYMPH%: 6.7 % — ABNORMAL LOW (ref 14.0–49.0)
MONO#: 0.7 10*3/uL (ref 0.1–0.9)
NEUT%: 85.3 % — ABNORMAL HIGH (ref 39.0–75.0)
Platelets: 129 10*3/uL — ABNORMAL LOW (ref 140–400)
WBC: 15.7 10*3/uL — ABNORMAL HIGH (ref 4.0–10.3)
lymph#: 1.1 10*3/uL (ref 0.9–3.3)

## 2011-10-19 NOTE — Telephone Encounter (Signed)
S/w patient that there is no change in his hydrea 500 mg daily. Pt expressed understanding and confirmed appt in 3 weeks

## 2011-10-19 NOTE — Progress Notes (Signed)
Phlebotomy not performed d/t pt Hgb 42.8%.  Threshold per Dr. Mamie Levers note is 43%.  Pt informed and verbalized understanding.

## 2011-11-09 ENCOUNTER — Ambulatory Visit: Payer: Medicare Other

## 2011-11-09 ENCOUNTER — Other Ambulatory Visit (HOSPITAL_BASED_OUTPATIENT_CLINIC_OR_DEPARTMENT_OTHER): Payer: Medicare Other | Admitting: Lab

## 2011-11-09 DIAGNOSIS — D45 Polycythemia vera: Secondary | ICD-10-CM | POA: Diagnosis not present

## 2011-11-09 LAB — CBC WITH DIFFERENTIAL/PLATELET
Basophils Absolute: 0.2 10*3/uL — ABNORMAL HIGH (ref 0.0–0.1)
Eosinophils Absolute: 0.2 10*3/uL (ref 0.0–0.5)
HCT: 42.7 % (ref 38.4–49.9)
HGB: 13 g/dL (ref 13.0–17.1)
MCH: 25.3 pg — ABNORMAL LOW (ref 27.2–33.4)
MCV: 83.2 fL (ref 79.3–98.0)
MONO%: 4.1 % (ref 0.0–14.0)
NEUT#: 13.8 10*3/uL — ABNORMAL HIGH (ref 1.5–6.5)
NEUT%: 86 % — ABNORMAL HIGH (ref 39.0–75.0)
Platelets: 169 10*3/uL (ref 140–400)
RDW: 15.8 % — ABNORMAL HIGH (ref 11.0–14.6)

## 2011-11-09 NOTE — Progress Notes (Unsigned)
Hct today 42.7 compared to 42.8 at last visit. Phlebotomy held since <43% per MD parameters. Patient aware of next appointment.

## 2011-11-14 DIAGNOSIS — L509 Urticaria, unspecified: Secondary | ICD-10-CM | POA: Diagnosis not present

## 2011-11-14 DIAGNOSIS — I10 Essential (primary) hypertension: Secondary | ICD-10-CM | POA: Diagnosis not present

## 2011-11-14 DIAGNOSIS — I359 Nonrheumatic aortic valve disorder, unspecified: Secondary | ICD-10-CM | POA: Diagnosis not present

## 2011-11-14 DIAGNOSIS — R7301 Impaired fasting glucose: Secondary | ICD-10-CM | POA: Diagnosis not present

## 2011-11-28 ENCOUNTER — Telehealth: Payer: Self-pay | Admitting: *Deleted

## 2011-11-28 NOTE — Telephone Encounter (Signed)
I tried to call the patent to move his appts for 6/6. No answer at home number and unable to leave message.  JMW

## 2011-11-30 ENCOUNTER — Ambulatory Visit (HOSPITAL_BASED_OUTPATIENT_CLINIC_OR_DEPARTMENT_OTHER): Payer: Medicare Other

## 2011-11-30 ENCOUNTER — Other Ambulatory Visit (HOSPITAL_BASED_OUTPATIENT_CLINIC_OR_DEPARTMENT_OTHER): Payer: Medicare Other | Admitting: Lab

## 2011-11-30 DIAGNOSIS — D45 Polycythemia vera: Secondary | ICD-10-CM

## 2011-11-30 LAB — CBC WITH DIFFERENTIAL/PLATELET
BASO%: 1.4 % (ref 0.0–2.0)
Eosinophils Absolute: 0.3 10*3/uL (ref 0.0–0.5)
HCT: 43.4 % (ref 38.4–49.9)
LYMPH%: 8.3 % — ABNORMAL LOW (ref 14.0–49.0)
MCHC: 30.4 g/dL — ABNORMAL LOW (ref 32.0–36.0)
MCV: 82.8 fL (ref 79.3–98.0)
MONO#: 0.6 10*3/uL (ref 0.1–0.9)
MONO%: 3.5 % (ref 0.0–14.0)
NEUT%: 85.2 % — ABNORMAL HIGH (ref 39.0–75.0)
Platelets: 150 10*3/uL (ref 140–400)
WBC: 16.7 10*3/uL — ABNORMAL HIGH (ref 4.0–10.3)

## 2011-12-11 ENCOUNTER — Encounter: Payer: Self-pay | Admitting: *Deleted

## 2011-12-21 ENCOUNTER — Other Ambulatory Visit: Payer: Medicare Other | Admitting: Lab

## 2011-12-22 ENCOUNTER — Other Ambulatory Visit (HOSPITAL_BASED_OUTPATIENT_CLINIC_OR_DEPARTMENT_OTHER): Payer: Medicare Other | Admitting: Lab

## 2011-12-22 ENCOUNTER — Ambulatory Visit: Payer: Medicare Other

## 2011-12-22 DIAGNOSIS — D45 Polycythemia vera: Secondary | ICD-10-CM

## 2011-12-22 LAB — CBC WITH DIFFERENTIAL/PLATELET
Basophils Absolute: 0.2 10*3/uL — ABNORMAL HIGH (ref 0.0–0.1)
EOS%: 1.2 % (ref 0.0–7.0)
HCT: 41.8 % (ref 38.4–49.9)
HGB: 12.6 g/dL — ABNORMAL LOW (ref 13.0–17.1)
MCH: 25.1 pg — ABNORMAL LOW (ref 27.2–33.4)
MCV: 83.3 fL (ref 79.3–98.0)
MONO%: 3.5 % (ref 0.0–14.0)
NEUT%: 87.8 % — ABNORMAL HIGH (ref 39.0–75.0)

## 2011-12-22 NOTE — Progress Notes (Signed)
Phlebotomy cancelled, Hct 41.8.  Out of parameters of hct +>43.  Pt aware of future appts.  dmr

## 2012-01-05 ENCOUNTER — Telehealth: Payer: Self-pay | Admitting: Oncology

## 2012-01-05 ENCOUNTER — Other Ambulatory Visit (HOSPITAL_BASED_OUTPATIENT_CLINIC_OR_DEPARTMENT_OTHER): Payer: Medicare Other | Admitting: Lab

## 2012-01-05 ENCOUNTER — Ambulatory Visit (HOSPITAL_BASED_OUTPATIENT_CLINIC_OR_DEPARTMENT_OTHER): Payer: Medicare Other | Admitting: Oncology

## 2012-01-05 ENCOUNTER — Encounter: Payer: Self-pay | Admitting: Oncology

## 2012-01-05 VITALS — BP 123/71 | HR 68 | Temp 96.9°F | Ht 69.5 in | Wt 205.4 lb

## 2012-01-05 DIAGNOSIS — D45 Polycythemia vera: Secondary | ICD-10-CM

## 2012-01-05 LAB — CBC WITH DIFFERENTIAL/PLATELET
BASO%: 2.6 % — ABNORMAL HIGH (ref 0.0–2.0)
Eosinophils Absolute: 0.2 10*3/uL (ref 0.0–0.5)
LYMPH%: 6.9 % — ABNORMAL LOW (ref 14.0–49.0)
MCHC: 30.1 g/dL — ABNORMAL LOW (ref 32.0–36.0)
MCV: 83.4 fL (ref 79.3–98.0)
MONO#: 0.7 10*3/uL (ref 0.1–0.9)
MONO%: 5.1 % (ref 0.0–14.0)
NEUT#: 12 10*3/uL — ABNORMAL HIGH (ref 1.5–6.5)
Platelets: 136 10*3/uL — ABNORMAL LOW (ref 140–400)
RBC: 5.11 10*6/uL (ref 4.20–5.82)
RDW: 17.2 % — ABNORMAL HIGH (ref 11.0–14.6)
WBC: 14.3 10*3/uL — ABNORMAL HIGH (ref 4.0–10.3)

## 2012-01-05 LAB — COMPREHENSIVE METABOLIC PANEL
ALT: 17 U/L (ref 0–53)
Albumin: 3.9 g/dL (ref 3.5–5.2)
Alkaline Phosphatase: 64 U/L (ref 39–117)
Potassium: 4.3 mEq/L (ref 3.5–5.3)
Sodium: 139 mEq/L (ref 135–145)
Total Bilirubin: 0.5 mg/dL (ref 0.3–1.2)
Total Protein: 5.5 g/dL — ABNORMAL LOW (ref 6.0–8.3)

## 2012-01-05 NOTE — Progress Notes (Signed)
CC:   [name] [name], M.D.  PROBLEM LIST:  1. Polycythemia vera dating back to 1997, JAK2 mutation positive,  currently being managed with phlebotomy and hydroxyurea.  2. Hypertension.  3. BPH.  4. History of reactive hypoglycemia.  5. History of depression and panic attacks.  6. Systolic ejection murmur.  7. Gout.  8. Eczema.      MEDICATIONS:  1. Allopurinol 200 mg daily.  2. Hytrin 5 mg at bedtime 3. Aspirin half of a 325-mg tablet daily.  4. Fish oil omega-3 fatty acids 1000 mg daily.  5. Hydroxyurea 500 mg daily.  6. Multivitamins 1 daily.  7. Vitamin E 400 units daily.  Lotrel has been discontinued, and in its place, another antihypertensive agent has been started.    HISTORY:  I saw [name] [name] today for followup of his polycythemia vera with JAK2 mutation present currently being managed by phlebotomy and hydroxyurea 500 mg daily.  [name] [name] diagnosis of polycythemia vera dates back in 1997.  He was last seen by [place] on 09/07/2011.  On that date his hematocrit was 45.1 and he underwent a 1 unit phlebotomy.  He underwent a 1 unit phlebotomy on 09/28/2011 for a hematocrit of 43.6 and again on May 16th for a hematocrit of 43.4.  He did not require a phlebotomy on April 4th, April 25th and June 7th.  He has tolerated his phlebotomies well.  It will be recalled that [name] [name] hematocrit was 49.2 back in late October 2012.  [name] [name] denies any bleeding bruising or thrombotic episodes.  He generally feels well.  PHYSICAL EXAM:  [name] [name] looks well, certainly a youthful man for his age of 50.  Weight is 205.4 pounds which is generally stable.  Height 5 feet 9-1/2 inches, body surface area 2.14 m2.  Blood pressure 123/71. Other vital signs are normal.  There is no scleral icterus.  Mouth and pharynx are benign.  No peripheral adenopathy palpable.  Lungs:  Clear to percussion and auscultation.  Cardiac:  Regular rhythm with soft systolic ejection murmur.  Breasts  remain main prominent bilaterally. Abdomen:  Benign with no organomegaly or masses palpable.  Extremities: Puffy ankles bilaterally.  The patient has been noted to have eczema in the past.  Neurologic:  Normal.  LABORATORY DATA:  Today, white count 14.3, ANC 12.0, hemoglobin 12.8, hematocrit 42.7, platelets 136,000.  On 12/22/2011 platelet count was 104,000 and on 05/16 were 150,000.  Platelet count has been somewhat variable, but generally runs low.  Chemistries today are pending. Chemistries from 09/07/2011 were normal except for a BUN of 29. Creatinine was 1.30, albumin 4.1.  Uric acid back on 05/12/2011 had been 5.1.  Ferritin back on 11/03/2010 was 8.  Red cell indices remain normal.  IMAGING STUDIES: Ultrasound of the abdomen from 08/06/2001: Spleen was  thought to be within the upper limits of normal with no focal  abnormality. Spleen measured 135.6 x 66.1 mm.   IMPRESSION AND PLAN:  [name] [name] continues to do well with no complications related to his polycythemia vera.  Platelet count does tend to run a little low.  [name] [name] was offered to have a phlebotomy today, but he declined.  Will plan to check a CBC in 3 weeks which will be around July 12th with subsequent CBCs and possible phlebotomies to be on August 9th, September 6th and October 4th.  Intervals after July will be every 4 weeks.  Will plan to see [name] [name] again in 4 months, around November  1st at which time we will check CBC and chemistries.    ______________________________ [name] [name], M.D. DSM/MEDQ  D:  01/05/2012  T:  01/05/2012  Job:  [id number]

## 2012-01-05 NOTE — Telephone Encounter (Signed)
appts made and printed for  Pt aom °

## 2012-01-05 NOTE — Progress Notes (Signed)
This office note has been dictated.  #[id number]

## 2012-01-26 ENCOUNTER — Other Ambulatory Visit (HOSPITAL_BASED_OUTPATIENT_CLINIC_OR_DEPARTMENT_OTHER): Payer: Medicare Other | Admitting: Lab

## 2012-01-26 ENCOUNTER — Ambulatory Visit (HOSPITAL_BASED_OUTPATIENT_CLINIC_OR_DEPARTMENT_OTHER): Payer: Medicare Other

## 2012-01-26 DIAGNOSIS — D45 Polycythemia vera: Secondary | ICD-10-CM | POA: Diagnosis not present

## 2012-01-26 LAB — CBC WITH DIFFERENTIAL/PLATELET
BASO%: 1.4 % (ref 0.0–2.0)
Basophils Absolute: 0.2 10*3/uL — ABNORMAL HIGH (ref 0.0–0.1)
Eosinophils Absolute: 0.2 10*3/uL (ref 0.0–0.5)
HCT: 44.4 % (ref 38.4–49.9)
HGB: 13.7 g/dL (ref 13.0–17.1)
LYMPH%: 7.5 % — ABNORMAL LOW (ref 14.0–49.0)
MCHC: 30.9 g/dL — ABNORMAL LOW (ref 32.0–36.0)
MONO#: 0.9 10*3/uL (ref 0.1–0.9)
NEUT#: 13.5 10*3/uL — ABNORMAL HIGH (ref 1.5–6.5)
NEUT%: 84.2 % — ABNORMAL HIGH (ref 39.0–75.0)
Platelets: 145 10*3/uL (ref 140–400)
WBC: 16.1 10*3/uL — ABNORMAL HIGH (ref 4.0–10.3)
lymph#: 1.2 10*3/uL (ref 0.9–3.3)

## 2012-01-26 NOTE — Patient Instructions (Addendum)
Therapeutic Phlebotomy Therapeutic phlebotomy is the controlled removal of blood from your body for the purpose of treating a medical condition. It is similar to donating blood. Usually, about a pint (470 mL) of blood is removed. The average adult has 9 to 12 pints (4.3 to 5.7 L) of blood. Therapeutic phlebotomy may be used to treat the following medical conditions:  Hemochromatosis. This is a condition in which there is too much iron in the blood.   Polycythemia vera. This is a condition in which there are too many red cells in the blood.   Porphyria cutanea tarda. This is a disease usually passed from one generation to the next (inherited). It is a condition in which an important part of hemoglobin is not made properly. This results in the build up of abnormal amounts of porphyrins in the body.   Sickle cell disease. This is an inherited disease. It is a condition in which the red blood cells form an abnormal crescent shape rather than a round shape.  LET YOUR CAREGIVER KNOW ABOUT:  Allergies.   Medicines taken including herbs, eyedrops, over-the-counter medicines, and creams.   Use of steroids (by mouth or creams).   Previous problems with anesthetics or numbing medicine.   History of blood clots.   History of bleeding or blood problems.   Previous surgery.   Possibility of pregnancy, if this applies.  RISKS AND COMPLICATIONS This is a simple and safe procedure. Problems are unlikely. However, problems can occur and may include:  Nausea or lightheadedness.   Low blood pressure.   Soreness, bleeding, swelling, or bruising at the needle insertion site.   Infection.  BEFORE THE PROCEDURE  This is a procedure that can be done as an outpatient. Confirm the time that you need to arrive for your procedure. Confirm whether there is a need to fast or withhold any medications. It is helpful to wear clothing with sleeves that can be raised above the elbow. A blood sample may be done  to determine the amount of red blood cells or iron in your blood. Plan ahead of time to have someone drive you home after the procedure. PROCEDURE The entire procedure from preparation through recovery takes about 1 hour. The actual collection takes about 10 to 15 minutes.  A needle will be inserted into your vein.   Tubing and a collection bag will be attached to that needle.   Blood will flow through the needle and tubing into the collection bag.   You may be asked to open and close your hand slowly and continuously during the entire collection.   Once the specified amount of blood has been removed from your body, the collection bag and tubing will be clamped.   The needle will be removed.   Pressure will be held on the site of the needle insertion to stop the bleeding. Then a bandage will be placed over the needle insertion site.  AFTER THE PROCEDURE  Your recovery will be assessed and monitored. If there are no problems, as an outpatient, you should be able to go home shortly after the procedure.  Document Released: 12/05/2010 Document Revised: 06/22/2011 Document Reviewed: 12/05/2010 [employer] Patient Information 2012 [employer]. 

## 2012-01-26 NOTE — Progress Notes (Signed)
1550-Therapeutic phlebotomy tolerated well.  blood removed.  Nourishments provided afterwards.  Pt denies complaints-dhp, rn 1610- Pt denies complaints.  VSS, discharged home-dhp, rn

## 2012-02-20 DIAGNOSIS — I10 Essential (primary) hypertension: Secondary | ICD-10-CM | POA: Diagnosis not present

## 2012-02-20 DIAGNOSIS — R42 Dizziness and giddiness: Secondary | ICD-10-CM | POA: Diagnosis not present

## 2012-02-20 DIAGNOSIS — I658 Occlusion and stenosis of other precerebral arteries: Secondary | ICD-10-CM | POA: Diagnosis not present

## 2012-02-20 DIAGNOSIS — R209 Unspecified disturbances of skin sensation: Secondary | ICD-10-CM | POA: Diagnosis not present

## 2012-02-20 DIAGNOSIS — R930 Abnormal findings on diagnostic imaging of skull and head, not elsewhere classified: Secondary | ICD-10-CM | POA: Diagnosis not present

## 2012-02-20 DIAGNOSIS — I729 Aneurysm of unspecified site: Secondary | ICD-10-CM | POA: Diagnosis not present

## 2012-02-23 ENCOUNTER — Ambulatory Visit (HOSPITAL_BASED_OUTPATIENT_CLINIC_OR_DEPARTMENT_OTHER): Payer: Medicare Other

## 2012-02-23 ENCOUNTER — Encounter: Payer: Self-pay | Admitting: Vascular Surgery

## 2012-02-23 ENCOUNTER — Other Ambulatory Visit (HOSPITAL_BASED_OUTPATIENT_CLINIC_OR_DEPARTMENT_OTHER): Payer: Medicare Other | Admitting: Lab

## 2012-02-23 ENCOUNTER — Encounter: Payer: Self-pay | Admitting: Oncology

## 2012-02-23 DIAGNOSIS — D45 Polycythemia vera: Secondary | ICD-10-CM

## 2012-02-23 LAB — CBC WITH DIFFERENTIAL/PLATELET
Basophils Absolute: 0.3 10*3/uL — ABNORMAL HIGH (ref 0.0–0.1)
EOS%: 1.4 % (ref 0.0–7.0)
Eosinophils Absolute: 0.3 10*3/uL (ref 0.0–0.5)
HCT: 43.3 % (ref 38.4–49.9)
HGB: 13.3 g/dL (ref 13.0–17.1)
MCH: 25.4 pg — ABNORMAL LOW (ref 27.2–33.4)
MCV: 82.6 fL (ref 79.3–98.0)
MONO%: 5.6 % (ref 0.0–14.0)
NEUT#: 15.1 10*3/uL — ABNORMAL HIGH (ref 1.5–6.5)
NEUT%: 83.9 % — ABNORMAL HIGH (ref 39.0–75.0)
RDW: 16.2 % — ABNORMAL HIGH (ref 11.0–14.6)

## 2012-02-23 NOTE — Progress Notes (Signed)
Mr. Dolinsky underwent a 1 unit phlebotomy for a hematocrit of 43.3 today. Our cutoff for him is 43%.

## 2012-02-26 ENCOUNTER — Encounter: Payer: Self-pay | Admitting: Vascular Surgery

## 2012-02-26 ENCOUNTER — Ambulatory Visit (INDEPENDENT_AMBULATORY_CARE_PROVIDER_SITE_OTHER): Payer: Medicare Other | Admitting: Vascular Surgery

## 2012-02-26 VITALS — BP 125/78 | HR 102 | Resp 20 | Ht 71.0 in | Wt 202.0 lb

## 2012-02-26 DIAGNOSIS — I729 Aneurysm of unspecified site: Secondary | ICD-10-CM | POA: Insufficient documentation

## 2012-02-26 NOTE — Progress Notes (Signed)
Subjective:     Patient ID: [name] [name], male   DOB: 12-27-1923, 78 y.o.   MRN: [id number]  HPI this 78 year old healthy male patient was referred by Dr. Felipa Eth to evaluate for possible carotid artery aneurysm. This patient one week ago experienced numbness in his face and a frontal headache. He had no syncope, lateralizing weakness, aphasia, syncope, or other specific neurologic symptoms. The symptoms lasted for an hour or 2 and then gradually resolved. He has had no recurrent symptoms. He has no history of stroke or TIAs in the past. Workup included an MRA of the head and a CT scan. There was concern regarding aneurysmal dilatation of the internal carotid artery within the skull near the cavernous sinus area. CT scan was done without contrast. There is no evidence of acute stroke. Carotid duplex exam was also performed which revealed no evidence of significant carotid artery stenosis or aneurysmal dilatation in the extracranial carotid artery.  Past Medical History  Diagnosis Date  . Polycythemia vera   . Gout   . HTN (hypertension)   . Depression   . BPH (benign prostatic hypertrophy)   . Dyslipidemia   . Mild aortic stenosis   . DJD (degenerative joint disease)   . Bone marrow disease     History  Substance Use Topics  . Smoking status: Never Smoker   . Smokeless tobacco: Never Used  . Alcohol Use: 4.2 oz/week    7 Glasses of wine per week     red wine    Family History  Problem Relation Age of Onset  . Hypertension Mother     Allergies  Allergen Reactions  . Colchicine     Current outpatient prescriptions:allopurinol (ZYLOPRIM) 100 MG tablet, Take 200 mg by mouth daily., Disp: , Rfl: ;  aspirin 325 MG buffered tablet, Take 325 mg by mouth daily. Take 1/2 tablet daily, Disp: , Rfl: ;  fish oil-omega-3 fatty acids 1000 MG capsule, Take 1 g by mouth daily., Disp: , Rfl: ;  hydroxyurea (HYDREA) 500 MG capsule, Take 500 mg by mouth daily. May take with food to minimize GI side  effects., Disp: , Rfl:  lisinopril (PRINIVIL,ZESTRIL) 10 MG tablet, Take 10 mg by mouth daily., Disp: , Rfl: ;  Multiple Vitamin (MULTIVITAMIN) tablet, Take 1 tablet by mouth daily., Disp: , Rfl: ;  terazosin (HYTRIN) 5 MG capsule, Take 5 mg by mouth at bedtime., Disp: , Rfl: ;  vitamin E (VITAMIN E) 400 UNIT capsule, Take 400 Units by mouth daily., Disp: , Rfl:   BP 125/78  Pulse 102  Resp 20  Ht 5\' 11"  (1.803 m)  Wt 202 lb (91.627 kg)  BMI 28.17 kg/m2  Body mass index is 28.17 kg/(m^2).          Review of Systems chest pain, dyspnea on exertion, PND, orthopnea, claudication. Patient does receive blood transfusions monthly for polycythemia vera. No hemoptysis, chronic bronchitis, or previous neurologic symptoms    Objective:   Physical Exam blood pressure 125/78 heart rate 100 respirations 20 Gen.-alert and oriented x3 in no apparent distress HEENT normal for age Lungs no rhonchi or wheezing Cardiovascular regular rhythm no murmurs carotid pulses 3+ palpable no bruits audible Abdomen soft nontender no palpable masses Musculoskeletal free of  major deformities Skin clear -no rashes Neurologic normal Lower extremities 3+ femoral and dorsalis pedis pulses palpable bilaterally with no edema  I reviewed the clinical records supplied by Dr. Felipa Eth and the reports of the CT scan, carotid ultrasound, and  MRA of the head.     Assessment:     #1 no evidence of extracranial carotid occlusive disease or aneurysmal disease on ultrasound or physical exam #2 episode 1 week ago of facial numbness and headache-etiology unknown #3 evidence of possible dilatation of internal carotid artery within the skull-not definitive    Plan:     This potential abnormality is out of my area of expertise-patient needs consult from neurosurgery combined with neuroradiology to see if further diagnostic studies such as CT angiogram or formal angiogram is indicated. I discussed this with patient and answered  his questions and he does understand. He will consult further with Dr.Avva

## 2012-03-01 DIAGNOSIS — I720 Aneurysm of carotid artery: Secondary | ICD-10-CM | POA: Diagnosis not present

## 2012-03-01 DIAGNOSIS — F329 Major depressive disorder, single episode, unspecified: Secondary | ICD-10-CM | POA: Diagnosis not present

## 2012-03-07 DIAGNOSIS — I671 Cerebral aneurysm, nonruptured: Secondary | ICD-10-CM | POA: Diagnosis not present

## 2012-03-14 ENCOUNTER — Other Ambulatory Visit: Payer: Self-pay | Admitting: Oncology

## 2012-03-22 ENCOUNTER — Other Ambulatory Visit (HOSPITAL_BASED_OUTPATIENT_CLINIC_OR_DEPARTMENT_OTHER): Payer: Medicare Other

## 2012-03-22 ENCOUNTER — Ambulatory Visit: Payer: Medicare Other

## 2012-03-22 DIAGNOSIS — D45 Polycythemia vera: Secondary | ICD-10-CM | POA: Diagnosis not present

## 2012-03-22 LAB — CBC WITH DIFFERENTIAL/PLATELET
Basophils Absolute: 0.3 10*3/uL — ABNORMAL HIGH (ref 0.0–0.1)
EOS%: 1.5 % (ref 0.0–7.0)
HCT: 42.3 % (ref 38.4–49.9)
HGB: 12.9 g/dL — ABNORMAL LOW (ref 13.0–17.1)
LYMPH%: 7.6 % — ABNORMAL LOW (ref 14.0–49.0)
MCH: 25.3 pg — ABNORMAL LOW (ref 27.2–33.4)
NEUT%: 85 % — ABNORMAL HIGH (ref 39.0–75.0)
Platelets: 131 10*3/uL — ABNORMAL LOW (ref 140–400)
lymph#: 1.3 10*3/uL (ref 0.9–3.3)

## 2012-03-22 NOTE — Progress Notes (Signed)
Per lab report Hct 42.3. No phlebotomy needed per orders.

## 2012-04-19 ENCOUNTER — Ambulatory Visit (HOSPITAL_BASED_OUTPATIENT_CLINIC_OR_DEPARTMENT_OTHER): Payer: Medicare Other

## 2012-04-19 ENCOUNTER — Other Ambulatory Visit (HOSPITAL_BASED_OUTPATIENT_CLINIC_OR_DEPARTMENT_OTHER): Payer: Medicare Other

## 2012-04-19 DIAGNOSIS — D45 Polycythemia vera: Secondary | ICD-10-CM | POA: Diagnosis not present

## 2012-04-19 LAB — CBC WITH DIFFERENTIAL/PLATELET
Basophils Absolute: 0.3 10*3/uL — ABNORMAL HIGH (ref 0.0–0.1)
Eosinophils Absolute: 0.3 10*3/uL (ref 0.0–0.5)
HGB: 13.2 g/dL (ref 13.0–17.1)
LYMPH%: 7.1 % — ABNORMAL LOW (ref 14.0–49.0)
MCH: 25.4 pg — ABNORMAL LOW (ref 27.2–33.4)
MCV: 84.4 fL (ref 79.3–98.0)
MONO%: 5.1 % (ref 0.0–14.0)
NEUT#: 15 10*3/uL — ABNORMAL HIGH (ref 1.5–6.5)
Platelets: 148 10*3/uL (ref 140–400)
RBC: 5.2 10*6/uL (ref 4.20–5.82)

## 2012-04-19 NOTE — Progress Notes (Signed)
500cc blood obtained from therapeutic phlebotomy as patient's Hct >43.  PAtient tolerated with no difficulty.  Nourishments provided.

## 2012-04-19 NOTE — Patient Instructions (Signed)
Therapeutic Phlebotomy Therapeutic phlebotomy is the controlled removal of blood from your body for the purpose of treating a medical condition. It is similar to donating blood. Usually, about a pint (470 mL) of blood is removed. The average adult has 9 to 12 pints (4.3 to 5.7 L) of blood. Therapeutic phlebotomy may be used to treat the following medical conditions:  Hemochromatosis. This is a condition in which there is too much iron in the blood.  Polycythemia vera. This is a condition in which there are too many red cells in the blood.  Porphyria cutanea tarda. This is a disease usually passed from one generation to the next (inherited). It is a condition in which an important part of hemoglobin is not made properly. This results in the build up of abnormal amounts of porphyrins in the body.  Sickle cell disease. This is an inherited disease. It is a condition in which the red blood cells form an abnormal crescent shape rather than a round shape. LET YOUR CAREGIVER KNOW ABOUT:  Allergies.  Medicines taken including herbs, eyedrops, over-the-counter medicines, and creams.  Use of steroids (by mouth or creams).  Previous problems with anesthetics or numbing medicine.  History of blood clots.  History of bleeding or blood problems.  Previous surgery.  Possibility of pregnancy, if this applies. RISKS AND COMPLICATIONS This is a simple and safe procedure. Problems are unlikely. However, problems can occur and may include:  Nausea or lightheadedness.  Low blood pressure.  Soreness, bleeding, swelling, or bruising at the needle insertion site.  Infection. BEFORE THE PROCEDURE  This is a procedure that can be done as an outpatient. Confirm the time that you need to arrive for your procedure. Confirm whether there is a need to fast or withhold any medications. It is helpful to wear clothing with sleeves that can be raised above the elbow. A blood sample may be done to determine the  amount of red blood cells or iron in your blood. Plan ahead of time to have someone drive you home after the procedure. PROCEDURE The entire procedure from preparation through recovery takes about 1 hour. The actual collection takes about 10 to 15 minutes.  A needle will be inserted into your vein.  Tubing and a collection bag will be attached to that needle.  Blood will flow through the needle and tubing into the collection bag.  You may be asked to open and close your hand slowly and continuously during the entire collection.  Once the specified amount of blood has been removed from your body, the collection bag and tubing will be clamped.  The needle will be removed.  Pressure will be held on the site of the needle insertion to stop the bleeding. Then a bandage will be placed over the needle insertion site. AFTER THE PROCEDURE  Your recovery will be assessed and monitored. If there are no problems, as an outpatient, you should be able to go home shortly after the procedure.  Document Released: 12/05/2010 Document Revised: 09/25/2011 Document Reviewed: 12/05/2010 [employer] Patient Information 2013 [employer].  

## 2012-04-23 ENCOUNTER — Encounter: Payer: Self-pay | Admitting: Oncology

## 2012-04-23 NOTE — Progress Notes (Signed)
Patient underwent a 1 unit phlebotomy on 02/23/2012 and on 04/19/2012.  CBCs are being checked every month.

## 2012-05-14 DIAGNOSIS — Z23 Encounter for immunization: Secondary | ICD-10-CM | POA: Diagnosis not present

## 2012-05-14 DIAGNOSIS — R7301 Impaired fasting glucose: Secondary | ICD-10-CM | POA: Diagnosis not present

## 2012-05-14 DIAGNOSIS — I10 Essential (primary) hypertension: Secondary | ICD-10-CM | POA: Diagnosis not present

## 2012-05-14 DIAGNOSIS — I720 Aneurysm of carotid artery: Secondary | ICD-10-CM | POA: Diagnosis not present

## 2012-05-17 ENCOUNTER — Other Ambulatory Visit (HOSPITAL_BASED_OUTPATIENT_CLINIC_OR_DEPARTMENT_OTHER): Payer: Medicare Other

## 2012-05-17 ENCOUNTER — Telehealth: Payer: Self-pay | Admitting: *Deleted

## 2012-05-17 ENCOUNTER — Telehealth: Payer: Self-pay | Admitting: Oncology

## 2012-05-17 ENCOUNTER — Encounter: Payer: Self-pay | Admitting: Oncology

## 2012-05-17 ENCOUNTER — Ambulatory Visit (HOSPITAL_BASED_OUTPATIENT_CLINIC_OR_DEPARTMENT_OTHER): Payer: Medicare Other | Admitting: Oncology

## 2012-05-17 VITALS — BP 131/76 | HR 91 | Temp 97.3°F | Resp 20 | Ht 71.0 in | Wt 203.8 lb

## 2012-05-17 DIAGNOSIS — I10 Essential (primary) hypertension: Secondary | ICD-10-CM

## 2012-05-17 DIAGNOSIS — D45 Polycythemia vera: Secondary | ICD-10-CM | POA: Diagnosis not present

## 2012-05-17 LAB — CBC WITH DIFFERENTIAL/PLATELET
BASO%: 2.3 % — ABNORMAL HIGH (ref 0.0–2.0)
EOS%: 1 % (ref 0.0–7.0)
HCT: 42.1 % (ref 38.4–49.9)
LYMPH%: 6.7 % — ABNORMAL LOW (ref 14.0–49.0)
MCH: 25.2 pg — ABNORMAL LOW (ref 27.2–33.4)
MCHC: 30 g/dL — ABNORMAL LOW (ref 32.0–36.0)
MONO%: 4.7 % (ref 0.0–14.0)
NEUT%: 85.3 % — ABNORMAL HIGH (ref 39.0–75.0)
Platelets: 136 10*3/uL — ABNORMAL LOW (ref 140–400)

## 2012-05-17 LAB — COMPREHENSIVE METABOLIC PANEL (CC13)
ALT: 19 U/L (ref 0–55)
AST: 21 U/L (ref 5–34)
CO2: 28 mEq/L (ref 22–29)
Creatinine: 1.1 mg/dL (ref 0.7–1.3)
Total Bilirubin: 0.66 mg/dL (ref 0.20–1.20)

## 2012-05-17 LAB — LACTATE DEHYDROGENASE (CC13): LDH: 225 U/L — ABNORMAL HIGH (ref 125–220)

## 2012-05-17 NOTE — Progress Notes (Signed)
This office note has been dictated.  #[id number]

## 2012-05-17 NOTE — Patient Instructions (Addendum)
Continue CBCs and phlebotomy for hemoglobin>/=43%.  See you in 4 months.   Happy Holidays.

## 2012-05-17 NOTE — Telephone Encounter (Signed)
Pt aware that i will mail all appts,email to mw to add phlebot   aom

## 2012-05-17 NOTE — Progress Notes (Signed)
CC:   [name] [name], M.D.   PROBLEM LIST:  1. Polycythemia vera dating back to 1997, JAK2 mutation positive,  currently being managed with phlebotomy and hydroxyurea. Phlebotomies are carried out for hematocrit greater than or equal to 43%. 2. Hypertension.  3. BPH.  4. History of reactive hypoglycemia.  5. History of depression and panic attacks in 1984.  6. Systolic ejection murmur.  7. Gout.  8. Eczema.    MEDICATIONS:  1. Allopurinol 200 mg daily.  2. Hytrin 5 mg at bedtime  3. Aspirin half of a 325-mg tablet daily.  4. Fish oil omega-3 fatty acids 1000 mg daily.  5. Hydroxyurea 500 mg daily.  6. Multivitamins 1 daily.  7. Vitamin E 400 units daily. 8. Lisinopril 10 mg daily. 9. Lexapro 10 mg daily.  SMOKING HISTORY:  The patient has never smoked cigarettes.   HISTORY:  [name] [name] was seen today for followup of his polycythemia vera with JAK2 mutation present, currently being managed by hydroxyurea 500 mg daily and phlebotomy whenever the hematocrit is greater than or equal to 43%.  [name] [name] diagnosis of polycythemia vera dates back to 8.  He was last seen by [place] on 01/05/2012.  Most recently we have been checking his CBCs every 4 weeks.  [name] [name] underwent 1 unit phlebotomies on July 12 when his hematocrit was 44.4, on August 9 when his hematocrit was 43.3 and most recently on October 4 when his hematocrit was 43.9.  [name] [name] apparently had an episode of some dizziness and facial numbness which occurred in early August of this year.  The patient has undergone a very extensive workup.  He has seen Dr. Josephina Gip and also Dr. Donalee Citrin.  Apparently the patient may have a small intercerebral aneurysm which does not require any treatment.  He has also undergone carotid Dopplers and CT scan of the brain and MRA of the cerebral circulation.  He has had no recurrent episodes.  Ultimately the patient was felt to have some depression.  He was placed on Lexapro  a couple months and reports that he is feeling better.  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  He tells me that his wife is becoming progressively demented.  I believe she has some physical disabilities.  That certainly seems to be tying the patient down as he is the primary caregiver.  He also says he also does not have as much stamina as he used to.  As stated, he has had no further neurologic events.  PHYSICAL EXAMINATION:  He continues to look well.  He will be celebrating his 88th birthday on December 8.  Weight is 203.8 pounds, height 5 feet 11 inches, body surface area 2.15 m2.  Blood pressure 131/76.  Other vital signs are normal.  There is no scleral icterus. Mouth and pharynx are benign.  There is no peripheral adenopathy palpable.  Heart and lungs are normal.  I did not appreciate systolic ejection murmur as I have in the past.  Abdomen is benign with no organomegaly or masses palpable.  I could not appreciate any splenomegaly.  Extremities slightly puffy ankles.  Neurologic exam was grossly normal.  LABORATORY DATA:  Today, white count 16.3, ANC 13.9, hemoglobin 12.6, hematocrit 42.1, platelets 136,000.  Chemistries today were essentially normal.  BUN 23.0, creatinine 1.1, albumin 3.8, LDH 225.   IMAGING STUDIES:  1. Ultrasound of the abdomen from 08/06/2001: Spleen was  thought to be within the upper limits of normal with no  focal  abnormality. Spleen measured 135.6 x 66.1 mm. 2. On 02/20/2012 the patient underwent an MR/MRA of the head/brain without IV contrast.  Also a CT scan of the head with and without IV contrast and a carotid Doppler study.  These studies were carried out at [facility].    3.  The carotid Doppler study showed bilateral mild carotid plaque formation without ultrasonic findings indicative of hemodynamically significant carotid stenosis.  An irregular heart rhythm was noted during the exam.   IMPRESSION AND PLAN:  [name] [name] continues to do  well.  I do not think there is any connection between his underlying polycythemia vera and the episode he had in early August.  The patient's hemoglobin/hematocrit have certainly been kept in a very satisfactory range.  I am reluctant to increase his hydroxyurea dose in an attempt to lower his white count because his platelet count will drop further.  Certainly if the patient has any further neurologic episodes I have encouraged him to call us. In the meantime, we will continue with CBC every 4 weeks and carrying out a 1 unit phlebotomy whenever the hematocrit is greater than or equal to 43%.  The patient seems to tolerate his phlebotomies well.  We will plan to see [name] [name] again in 4 months which will be around February 24 at which time we will check CBC and chemistries.    ______________________________ Samul Dada, M.D. DSM/MEDQ  D:  05/17/2012  T:  05/17/2012  Job:  [id number]

## 2012-05-17 NOTE — Telephone Encounter (Signed)
Per staff message and POF I have scheduled appts.  JMW  

## 2012-05-21 ENCOUNTER — Telehealth: Payer: Self-pay | Admitting: Oncology

## 2012-05-21 NOTE — Telephone Encounter (Signed)
appts printed and mailed to pt  aom

## 2012-06-04 ENCOUNTER — Encounter: Payer: Self-pay | Admitting: Cardiology

## 2012-06-06 ENCOUNTER — Telehealth: Payer: Self-pay | Admitting: Oncology

## 2012-06-06 NOTE — Telephone Encounter (Signed)
Pt called today to moved 11/26 lb/phleb to 12/3 due to he is going out of town. Pt given new appt for 12/3 and made aware that remaining appts will need to be adjusted to remain one month apart. Pt will get new schedule when he comes in 12/3

## 2012-06-11 ENCOUNTER — Other Ambulatory Visit: Payer: Medicare Other | Admitting: Lab

## 2012-06-18 ENCOUNTER — Other Ambulatory Visit (HOSPITAL_BASED_OUTPATIENT_CLINIC_OR_DEPARTMENT_OTHER): Payer: Medicare Other | Admitting: Lab

## 2012-06-18 ENCOUNTER — Ambulatory Visit (HOSPITAL_BASED_OUTPATIENT_CLINIC_OR_DEPARTMENT_OTHER): Payer: Medicare Other

## 2012-06-18 DIAGNOSIS — D45 Polycythemia vera: Secondary | ICD-10-CM | POA: Diagnosis not present

## 2012-06-18 LAB — CBC WITH DIFFERENTIAL/PLATELET
Eosinophils Absolute: 0.2 10*3/uL (ref 0.0–0.5)
HCT: 44.6 % (ref 38.4–49.9)
LYMPH%: 6.4 % — ABNORMAL LOW (ref 14.0–49.0)
MONO#: 1 10*3/uL — ABNORMAL HIGH (ref 0.1–0.9)
NEUT#: 16 10*3/uL — ABNORMAL HIGH (ref 1.5–6.5)
Platelets: 133 10*3/uL — ABNORMAL LOW (ref 140–400)
RBC: 5.27 10*6/uL (ref 4.20–5.82)
WBC: 18.7 10*3/uL — ABNORMAL HIGH (ref 4.0–10.3)
nRBC: 0 % (ref 0–0)

## 2012-06-18 NOTE — Patient Instructions (Signed)

## 2012-06-19 ENCOUNTER — Encounter: Payer: Self-pay | Admitting: Oncology

## 2012-06-19 NOTE — Progress Notes (Signed)
[name] [name] underwent a 1 unit phlebotomy yesterday, 06/18/2012.  Hemoglobin was 13.2. Hematocrit was 44.6. Our target hematocrit for phlebotomy has been 43%.

## 2012-07-08 ENCOUNTER — Other Ambulatory Visit: Payer: Medicare Other | Admitting: Lab

## 2012-07-16 ENCOUNTER — Ambulatory Visit (HOSPITAL_BASED_OUTPATIENT_CLINIC_OR_DEPARTMENT_OTHER): Payer: Medicare Other

## 2012-07-16 ENCOUNTER — Other Ambulatory Visit (HOSPITAL_BASED_OUTPATIENT_CLINIC_OR_DEPARTMENT_OTHER): Payer: Medicare Other | Admitting: Lab

## 2012-07-16 DIAGNOSIS — D45 Polycythemia vera: Secondary | ICD-10-CM | POA: Diagnosis not present

## 2012-07-16 LAB — CBC WITH DIFFERENTIAL/PLATELET
Basophils Absolute: 0.3 10*3/uL — ABNORMAL HIGH (ref 0.0–0.1)
EOS%: 1.3 % (ref 0.0–7.0)
Eosinophils Absolute: 0.2 10*3/uL (ref 0.0–0.5)
HCT: 43.3 % (ref 38.4–49.9)
HGB: 12.8 g/dL — ABNORMAL LOW (ref 13.0–17.1)
MCH: 25 pg — ABNORMAL LOW (ref 27.2–33.4)
MONO#: 1 10*3/uL — ABNORMAL HIGH (ref 0.1–0.9)
NEUT#: 14.9 10*3/uL — ABNORMAL HIGH (ref 1.5–6.5)
NEUT%: 84.7 % — ABNORMAL HIGH (ref 39.0–75.0)
lymph#: 1.2 10*3/uL (ref 0.9–3.3)

## 2012-07-16 NOTE — Progress Notes (Signed)
1411- Therapeutic phlebotomy completed.  539 grams blood removed.  Pt tolerated well.  Nourishments provided to pt-dhp, rn

## 2012-07-16 NOTE — Patient Instructions (Signed)
Therapeutic Phlebotomy Therapeutic phlebotomy is the controlled removal of blood from your body for the purpose of treating a medical condition. It is similar to donating blood. Usually, about a pint (470 mL) of blood is removed. The average adult has 9 to 12 pints (4.3 to 5.7 L) of blood. Therapeutic phlebotomy may be used to treat the following medical conditions:  Hemochromatosis. This is a condition in which there is too much iron in the blood.  Polycythemia vera. This is a condition in which there are too many red cells in the blood.  Porphyria cutanea tarda. This is a disease usually passed from one generation to the next (inherited). It is a condition in which an important part of hemoglobin is not made properly. This results in the build up of abnormal amounts of porphyrins in the body.  Sickle cell disease. This is an inherited disease. It is a condition in which the red blood cells form an abnormal crescent shape rather than a round shape. LET YOUR CAREGIVER KNOW ABOUT:  Allergies.  Medicines taken including herbs, eyedrops, over-the-counter medicines, and creams.  Use of steroids (by mouth or creams).  Previous problems with anesthetics or numbing medicine.  History of blood clots.  History of bleeding or blood problems.  Previous surgery.  Possibility of pregnancy, if this applies. RISKS AND COMPLICATIONS This is a simple and safe procedure. Problems are unlikely. However, problems can occur and may include:  Nausea or lightheadedness.  Low blood pressure.  Soreness, bleeding, swelling, or bruising at the needle insertion site.  Infection. BEFORE THE PROCEDURE  This is a procedure that can be done as an outpatient. Confirm the time that you need to arrive for your procedure. Confirm whether there is a need to fast or withhold any medications. It is helpful to wear clothing with sleeves that can be raised above the elbow. A blood sample may be done to determine the  amount of red blood cells or iron in your blood. Plan ahead of time to have someone drive you home after the procedure. PROCEDURE The entire procedure from preparation through recovery takes about 1 hour. The actual collection takes about 10 to 15 minutes.  A needle will be inserted into your vein.  Tubing and a collection bag will be attached to that needle.  Blood will flow through the needle and tubing into the collection bag.  You may be asked to open and close your hand slowly and continuously during the entire collection.  Once the specified amount of blood has been removed from your body, the collection bag and tubing will be clamped.  The needle will be removed.  Pressure will be held on the site of the needle insertion to stop the bleeding. Then a bandage will be placed over the needle insertion site. AFTER THE PROCEDURE  Your recovery will be assessed and monitored. If there are no problems, as an outpatient, you should be able to go home shortly after the procedure.  Document Released: 12/05/2010 Document Revised: 09/25/2011 Document Reviewed: 12/05/2010 [employer] Patient Information 2013 [employer].  

## 2012-08-09 ENCOUNTER — Other Ambulatory Visit: Payer: Medicare Other | Admitting: Lab

## 2012-08-13 ENCOUNTER — Other Ambulatory Visit (HOSPITAL_BASED_OUTPATIENT_CLINIC_OR_DEPARTMENT_OTHER): Payer: Medicare Other

## 2012-08-13 ENCOUNTER — Ambulatory Visit: Payer: Medicare Other

## 2012-08-13 DIAGNOSIS — D45 Polycythemia vera: Secondary | ICD-10-CM | POA: Diagnosis not present

## 2012-08-13 LAB — CBC WITH DIFFERENTIAL/PLATELET
Basophils Absolute: 0.5 10*3/uL — ABNORMAL HIGH (ref 0.0–0.1)
EOS%: 1.1 % (ref 0.0–7.0)
HCT: 41.8 % (ref 38.4–49.9)
HGB: 12.8 g/dL — ABNORMAL LOW (ref 13.0–17.1)
LYMPH%: 6.9 % — ABNORMAL LOW (ref 14.0–49.0)
MCH: 25 pg — ABNORMAL LOW (ref 27.2–33.4)
MCV: 81.6 fL (ref 79.3–98.0)
MONO%: 5 % (ref 0.0–14.0)
NEUT%: 83.9 % — ABNORMAL HIGH (ref 39.0–75.0)
Platelets: 117 10*3/uL — ABNORMAL LOW (ref 140–400)

## 2012-08-13 NOTE — Progress Notes (Signed)
Hct 41.8 No need for phlebotomy as below parameter of 43. Spoke with patient in lobby and given copy of labs.

## 2012-09-05 ENCOUNTER — Ambulatory Visit: Payer: Medicare Other | Admitting: Oncology

## 2012-09-05 ENCOUNTER — Other Ambulatory Visit: Payer: Medicare Other | Admitting: Lab

## 2012-09-09 DIAGNOSIS — E785 Hyperlipidemia, unspecified: Secondary | ICD-10-CM | POA: Diagnosis not present

## 2012-09-09 DIAGNOSIS — R7301 Impaired fasting glucose: Secondary | ICD-10-CM | POA: Diagnosis not present

## 2012-09-09 DIAGNOSIS — M109 Gout, unspecified: Secondary | ICD-10-CM | POA: Diagnosis not present

## 2012-09-09 DIAGNOSIS — Z125 Encounter for screening for malignant neoplasm of prostate: Secondary | ICD-10-CM | POA: Diagnosis not present

## 2012-09-10 ENCOUNTER — Telehealth: Payer: Self-pay | Admitting: Oncology

## 2012-09-10 ENCOUNTER — Encounter: Payer: Self-pay | Admitting: Oncology

## 2012-09-10 ENCOUNTER — Ambulatory Visit (HOSPITAL_BASED_OUTPATIENT_CLINIC_OR_DEPARTMENT_OTHER): Payer: Medicare Other | Admitting: Oncology

## 2012-09-10 ENCOUNTER — Other Ambulatory Visit (HOSPITAL_BASED_OUTPATIENT_CLINIC_OR_DEPARTMENT_OTHER): Payer: Medicare Other | Admitting: Lab

## 2012-09-10 ENCOUNTER — Telehealth: Payer: Self-pay | Admitting: *Deleted

## 2012-09-10 VITALS — BP 127/55 | HR 90 | Temp 98.1°F | Resp 18 | Ht 71.0 in | Wt 206.1 lb

## 2012-09-10 DIAGNOSIS — D45 Polycythemia vera: Secondary | ICD-10-CM | POA: Diagnosis not present

## 2012-09-10 LAB — COMPREHENSIVE METABOLIC PANEL (CC13)
AST: 19 U/L (ref 5–34)
Albumin: 3.5 g/dL (ref 3.5–5.0)
Alkaline Phosphatase: 75 U/L (ref 40–150)
BUN: 20.1 mg/dL (ref 7.0–26.0)
Potassium: 4.1 mEq/L (ref 3.5–5.1)
Sodium: 140 mEq/L (ref 136–145)
Total Bilirubin: 0.57 mg/dL (ref 0.20–1.20)

## 2012-09-10 LAB — CBC WITH DIFFERENTIAL/PLATELET
EOS%: 1.1 % (ref 0.0–7.0)
LYMPH%: 4.9 % — ABNORMAL LOW (ref 14.0–49.0)
MCH: 25 pg — ABNORMAL LOW (ref 27.2–33.4)
MCV: 81.9 fL (ref 79.3–98.0)
MONO%: 3.5 % (ref 0.0–14.0)
RBC: 5.22 10*6/uL (ref 4.20–5.82)
RDW: 16.5 % — ABNORMAL HIGH (ref 11.0–14.6)

## 2012-09-10 NOTE — Telephone Encounter (Signed)
Per staff message and POF I have scheduled appts.  JMW  

## 2012-09-10 NOTE — Progress Notes (Signed)
This office note has been dictated.  #[id number]

## 2012-09-10 NOTE — Telephone Encounter (Signed)
Pt is aware of all appts for labs and phlebotomy for February and March 2014

## 2012-09-11 NOTE — Progress Notes (Signed)
CC:   [name] [name], M.D.  PROBLEM LIST:  1. Polycythemia vera dating back to 1997, JAK2 mutation positive,  currently being managed with phlebotomy and hydroxyurea. Phlebotomies are carried out for hematocrit greater than or equal to 43%.  2. Hypertension.  3. BPH.  4. History of reactive hypoglycemia.  5. History of depression and panic attacks in 1984.  6. Systolic ejection murmur.  7. Gout.  8. Eczema.   MEDICATIONS:  Reviewed and recorded. Current Outpatient Prescriptions  Medication Sig Dispense Refill  . allopurinol (ZYLOPRIM) 100 MG tablet Take 200 mg by mouth daily.      Marland Kitchen ALPRAZolam (XANAX) 0.5 MG tablet Take 0.5 mg by mouth as needed.      Marland Kitchen aspirin 325 MG buffered tablet Take 325 mg by mouth daily. Take 1/2 tablet daily      . escitalopram (LEXAPRO) 10 MG tablet Take 10 mg by mouth daily.      . fish oil-omega-3 fatty acids 1000 MG capsule Take 1 g by mouth daily.      . hydroxyurea (HYDREA) 500 MG capsule Take 500 mg by mouth daily. May take with food to minimize GI side effects.      Marland Kitchen lisinopril (PRINIVIL,ZESTRIL) 10 MG tablet Take 10 mg by mouth daily.      . Multiple Vitamin (MULTIVITAMIN) tablet Take 1 tablet by mouth daily.      Marland Kitchen terazosin (HYTRIN) 5 MG capsule Take 5 mg by mouth at bedtime.      . vitamin E (VITAMIN E) 400 UNIT capsule Take 400 Units by mouth daily.       No current facility-administered medications for this visit.   Hydroxyurea 500 mg daily.   SMOKING HISTORY:  The patient has never smoked cigarettes.   HISTORY:  I saw [name] [name] today for followup of his polycythemia vera with JAK2 mutation present, currently being managed with hydroxyurea 500 mg daily and phlebotomy whenever the hematocrit is greater than or equal to 43%.  [name] [name] diagnosis of polycythemia vera dates back to 65.  He was last seen by [place] on 05/17/2012.  We have been checking CBCs every 4 weeks.  [name] [name] underwent a phlebotomy on 06/18/2012 for hematocrit of  44.6 and also phlebotomy on 07/16/2012 when the hematocrit was 43.3.  He tells me that he has not had any further episodes of dizziness, facial numbness, or other neurologic symptoms.  He is on Lexapro and thinks that perhaps he was having some anxiety attacks.  In general, he feels well.  There has been no change in his condition.  He has a fairly light schedule, going in 1 day a week related to his position at [employer].  He is involved in some research projects.  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  His wife's condition remains stable, although she is having some difficulties with memory and mobility.  The patient is without any complaints or major changes today.  PHYSICAL EXAMINATION:  General:  He looks well.  He is 79 years old. Weight is 206 pounds 1.6 ounces, height 5 feet 11 inches, body surface area 2.16 sq m.  Vital Signs:  Blood pressure 127/55.  Other vital signs are normal.  HEENT:  There is no scleral icterus.  Mouth and pharynx are benign.  No peripheral adenopathy palpable.  Lungs:  Clear to percussion and auscultation.  Cardiac:  Soft systolic ejection murmur was heard. Abdomen:  Benign with no organomegaly or masses palpable.  Extremities: Puffy ankles,  left greater than right.  Neurologic:  Grossly normal.  LABORATORY DATA:  Today, white count 19.1, ANC 17.0, hemoglobin 13.1, hematocrit 42.7, platelets 138,000.  Chemistries today are pending. Chemistries from 05/17/2012 were virtually normal.  IMAGING STUDIES:  1. Ultrasound of the abdomen from 08/06/2001: Spleen was  thought to be within the upper limits of normal with no focal  abnormality. Spleen measured 135.6 x 66.1 mm.  2. On 02/20/2012 the patient underwent an MR/MRA of the head/brain without IV contrast. Also a CT scan of the head with and without IV contrast and a carotid Doppler study. These studies were carried out at [facility].  3. The carotid Doppler study showed bilateral mild carotid plaque formation  without ultrasonic findings indicative of hemodynamically significant carotid  stenosis. An irregular heart rhythm was noted during the exam.    IMPRESSION AND PLAN:  [name] [name] continues to do well on the current program.  He does not need a phlebotomy today as his hematocrit is less than 43%.  I am going to leave the patient's dose of hydroxyurea at 500 mg daily without any changes, given his mild thrombocytopenia.  We will continue to have the patient come in every 4 weeks to have a CBC and a phlebotomy if his hematocrit is greater than or equal to 43%.  We will have [name] [name] return in the approximately 6 months, which will be around August 25th, at which time we will check CBC and chemistries. [name] [name] will have a phlebotomy if his hematocrit is greater than or equal to 43%.    ______________________________ Samul Dada, M.D. DSM/MEDQ  D:  09/10/2012  T:  09/11/2012  Job:  [id number]

## 2012-09-18 DIAGNOSIS — Z0000 Encounter for general adult medical examination without abnormal findings: Secondary | ICD-10-CM | POA: Diagnosis not present

## 2012-09-18 DIAGNOSIS — M109 Gout, unspecified: Secondary | ICD-10-CM | POA: Diagnosis not present

## 2012-09-18 DIAGNOSIS — F329 Major depressive disorder, single episode, unspecified: Secondary | ICD-10-CM | POA: Diagnosis not present

## 2012-09-18 DIAGNOSIS — I359 Nonrheumatic aortic valve disorder, unspecified: Secondary | ICD-10-CM | POA: Diagnosis not present

## 2012-09-18 DIAGNOSIS — R42 Dizziness and giddiness: Secondary | ICD-10-CM | POA: Diagnosis not present

## 2012-09-18 DIAGNOSIS — M1990 Unspecified osteoarthritis, unspecified site: Secondary | ICD-10-CM | POA: Diagnosis not present

## 2012-09-18 DIAGNOSIS — I720 Aneurysm of carotid artery: Secondary | ICD-10-CM | POA: Diagnosis not present

## 2012-09-18 DIAGNOSIS — I729 Aneurysm of unspecified site: Secondary | ICD-10-CM | POA: Diagnosis not present

## 2012-09-24 DIAGNOSIS — Z1212 Encounter for screening for malignant neoplasm of rectum: Secondary | ICD-10-CM | POA: Diagnosis not present

## 2012-10-08 ENCOUNTER — Ambulatory Visit (HOSPITAL_BASED_OUTPATIENT_CLINIC_OR_DEPARTMENT_OTHER): Payer: Medicare Other

## 2012-10-08 ENCOUNTER — Other Ambulatory Visit (HOSPITAL_BASED_OUTPATIENT_CLINIC_OR_DEPARTMENT_OTHER): Payer: Medicare Other | Admitting: Lab

## 2012-10-08 DIAGNOSIS — D45 Polycythemia vera: Secondary | ICD-10-CM | POA: Diagnosis not present

## 2012-10-08 LAB — CBC WITH DIFFERENTIAL/PLATELET
Basophils Absolute: 0.3 10*3/uL — ABNORMAL HIGH (ref 0.0–0.1)
Eosinophils Absolute: 0.2 10*3/uL (ref 0.0–0.5)
HCT: 45.9 % (ref 38.4–49.9)
HGB: 13.6 g/dL (ref 13.0–17.1)
LYMPH%: 7.4 % — ABNORMAL LOW (ref 14.0–49.0)
MONO#: 0.9 10*3/uL (ref 0.1–0.9)
NEUT#: 17 10*3/uL — ABNORMAL HIGH (ref 1.5–6.5)
NEUT%: 85.5 % — ABNORMAL HIGH (ref 39.0–75.0)
Platelets: 142 10*3/uL (ref 140–400)
WBC: 19.9 10*3/uL — ABNORMAL HIGH (ref 4.0–10.3)
lymph#: 1.5 10*3/uL (ref 0.9–3.3)

## 2012-10-08 NOTE — Patient Instructions (Signed)

## 2012-10-08 NOTE — Progress Notes (Signed)
Vitals stable s/p phlebotomy.  Ready for discharge.

## 2012-10-08 NOTE — Progress Notes (Signed)
Discharged alone to home, ambulating well, in no distress.  AVS summary given

## 2012-11-04 ENCOUNTER — Encounter: Payer: Self-pay | Admitting: Lab

## 2012-11-05 ENCOUNTER — Ambulatory Visit (HOSPITAL_BASED_OUTPATIENT_CLINIC_OR_DEPARTMENT_OTHER): Payer: Medicare Other

## 2012-11-05 ENCOUNTER — Other Ambulatory Visit (HOSPITAL_BASED_OUTPATIENT_CLINIC_OR_DEPARTMENT_OTHER): Payer: Medicare Other | Admitting: Lab

## 2012-11-05 VITALS — BP 114/67 | HR 88 | Temp 98.1°F | Resp 20

## 2012-11-05 DIAGNOSIS — D45 Polycythemia vera: Secondary | ICD-10-CM

## 2012-11-05 LAB — CBC WITH DIFFERENTIAL/PLATELET
Basophils Absolute: 0.3 10*3/uL — ABNORMAL HIGH (ref 0.0–0.1)
EOS%: 1.2 % (ref 0.0–7.0)
Eosinophils Absolute: 0.2 10*3/uL (ref 0.0–0.5)
HCT: 43.9 % (ref 38.4–49.9)
HGB: 13 g/dL (ref 13.0–17.1)
MCH: 25.3 pg — ABNORMAL LOW (ref 27.2–33.4)
MCV: 85.6 fL (ref 79.3–98.0)
MONO%: 4.2 % (ref 0.0–14.0)
NEUT#: 15.8 10*3/uL — ABNORMAL HIGH (ref 1.5–6.5)
NEUT%: 86.2 % — ABNORMAL HIGH (ref 39.0–75.0)

## 2012-11-05 NOTE — Patient Instructions (Signed)

## 2012-12-10 ENCOUNTER — Other Ambulatory Visit: Payer: Self-pay | Admitting: Medical Oncology

## 2012-12-10 ENCOUNTER — Other Ambulatory Visit (HOSPITAL_BASED_OUTPATIENT_CLINIC_OR_DEPARTMENT_OTHER): Payer: Medicare Other | Admitting: Lab

## 2012-12-10 ENCOUNTER — Telehealth: Payer: Self-pay | Admitting: *Deleted

## 2012-12-10 DIAGNOSIS — D45 Polycythemia vera: Secondary | ICD-10-CM

## 2012-12-10 LAB — CBC WITH DIFFERENTIAL/PLATELET
Eosinophils Absolute: 0.2 10*3/uL (ref 0.0–0.5)
HCT: 43.1 % (ref 38.4–49.9)
LYMPH%: 6.5 % — ABNORMAL LOW (ref 14.0–49.0)
MCV: 84 fL (ref 79.3–98.0)
MONO#: 0.9 10*3/uL (ref 0.1–0.9)
MONO%: 4.5 % (ref 0.0–14.0)
NEUT#: 17.4 10*3/uL — ABNORMAL HIGH (ref 1.5–6.5)
NEUT%: 86.8 % — ABNORMAL HIGH (ref 39.0–75.0)
Platelets: 119 10*3/uL — ABNORMAL LOW (ref 140–400)
RBC: 5.13 10*6/uL (ref 4.20–5.82)
WBC: 20 10*3/uL — ABNORMAL HIGH (ref 4.0–10.3)

## 2012-12-10 NOTE — Telephone Encounter (Signed)
sw pt gv lab appt to go along w/ his phlebotomy for 12/31/12 @ 12:30pm. i also made the pt aware that i would mail a letter/cal as well...td

## 2012-12-16 ENCOUNTER — Telehealth: Payer: Self-pay | Admitting: Oncology

## 2012-12-16 NOTE — Telephone Encounter (Signed)
, °

## 2012-12-31 ENCOUNTER — Ambulatory Visit (HOSPITAL_BASED_OUTPATIENT_CLINIC_OR_DEPARTMENT_OTHER): Payer: Medicare Other

## 2012-12-31 ENCOUNTER — Other Ambulatory Visit (HOSPITAL_BASED_OUTPATIENT_CLINIC_OR_DEPARTMENT_OTHER): Payer: Medicare Other | Admitting: Lab

## 2012-12-31 VITALS — BP 133/81 | HR 84 | Temp 98.0°F | Resp 20

## 2012-12-31 DIAGNOSIS — D45 Polycythemia vera: Secondary | ICD-10-CM | POA: Diagnosis not present

## 2012-12-31 LAB — CBC WITH DIFFERENTIAL/PLATELET
BASO%: 1.5 % (ref 0.0–2.0)
EOS%: 0.8 % (ref 0.0–7.0)
HCT: 45.8 % (ref 38.4–49.9)
LYMPH%: 5.9 % — ABNORMAL LOW (ref 14.0–49.0)
MCH: 25.7 pg — ABNORMAL LOW (ref 27.2–33.4)
MCHC: 29.5 g/dL — ABNORMAL LOW (ref 32.0–36.0)
MCV: 87.1 fL (ref 79.3–98.0)
MONO%: 4.4 % (ref 0.0–14.0)
NEUT%: 87.4 % — ABNORMAL HIGH (ref 39.0–75.0)
Platelets: 133 10*3/uL — ABNORMAL LOW (ref 140–400)
RBC: 5.26 10*6/uL (ref 4.20–5.82)
WBC: 21.2 10*3/uL — ABNORMAL HIGH (ref 4.0–10.3)
nRBC: 0 % (ref 0–0)

## 2012-12-31 NOTE — Progress Notes (Signed)
1330 -  Hct  45.8 today.  Phlebotomy performed in right antecubital with 16G needle without difficulty.  Approximately  528 gms of blood obtained and wasted.  Pt tolerated procedure without problems.  Nourishments given.  Post phlebotomy AVS given to pt.

## 2012-12-31 NOTE — Patient Instructions (Addendum)
Therapeutic Phlebotomy Therapeutic phlebotomy is the controlled removal of blood from your body for the purpose of treating a medical condition. It is similar to donating blood. Usually, about a pint (470 mL) of blood is removed. The average adult has 9 to 12 pints (4.3 to 5.7 L) of blood. Therapeutic phlebotomy may be used to treat the following medical conditions:  Hemochromatosis. This is a condition in which there is too much iron in the blood.  Polycythemia vera. This is a condition in which there are too many red cells in the blood.  Porphyria cutanea tarda. This is a disease usually passed from one generation to the next (inherited). It is a condition in which an important part of hemoglobin is not made properly. This results in the build up of abnormal amounts of porphyrins in the body.  Sickle cell disease. This is an inherited disease. It is a condition in which the red blood cells form an abnormal crescent shape rather than a round shape. LET YOUR CAREGIVER KNOW ABOUT:  Allergies.  Medicines taken including herbs, eyedrops, over-the-counter medicines, and creams.  Use of steroids (by mouth or creams).  Previous problems with anesthetics or numbing medicine.  History of blood clots.  History of bleeding or blood problems.  Previous surgery.  Possibility of pregnancy, if this applies. RISKS AND COMPLICATIONS This is a simple and safe procedure. Problems are unlikely. However, problems can occur and may include:  Nausea or lightheadedness.  Low blood pressure.  Soreness, bleeding, swelling, or bruising at the needle insertion site.  Infection. BEFORE THE PROCEDURE  This is a procedure that can be done as an outpatient. Confirm the time that you need to arrive for your procedure. Confirm whether there is a need to fast or withhold any medications. It is helpful to wear clothing with sleeves that can be raised above the elbow. A blood sample may be done to determine the  amount of red blood cells or iron in your blood. Plan ahead of time to have someone drive you home after the procedure. PROCEDURE The entire procedure from preparation through recovery takes about 1 hour. The actual collection takes about 10 to 15 minutes.  A needle will be inserted into your vein.  Tubing and a collection bag will be attached to that needle.  Blood will flow through the needle and tubing into the collection bag.  You may be asked to open and close your hand slowly and continuously during the entire collection.  Once the specified amount of blood has been removed from your body, the collection bag and tubing will be clamped.  The needle will be removed.  Pressure will be held on the site of the needle insertion to stop the bleeding. Then a bandage will be placed over the needle insertion site. AFTER THE PROCEDURE  Your recovery will be assessed and monitored. If there are no problems, as an outpatient, you should be able to go home shortly after the procedure.  Document Released: 12/05/2010 Document Revised: 09/25/2011 Document Reviewed: 12/05/2010 [employer] Patient Information 2014 [employer].  

## 2013-01-07 ENCOUNTER — Other Ambulatory Visit: Payer: Medicare Other | Admitting: Lab

## 2013-01-28 ENCOUNTER — Ambulatory Visit: Payer: Medicare Other

## 2013-01-28 ENCOUNTER — Other Ambulatory Visit (HOSPITAL_BASED_OUTPATIENT_CLINIC_OR_DEPARTMENT_OTHER): Payer: Medicare Other | Admitting: Lab

## 2013-01-28 DIAGNOSIS — D45 Polycythemia vera: Secondary | ICD-10-CM | POA: Diagnosis not present

## 2013-01-28 LAB — CBC WITH DIFFERENTIAL/PLATELET
BASO%: 1.4 % (ref 0.0–2.0)
Basophils Absolute: 0.3 10*3/uL — ABNORMAL HIGH (ref 0.0–0.1)
EOS%: 0.9 % (ref 0.0–7.0)
HGB: 12.7 g/dL — ABNORMAL LOW (ref 13.0–17.1)
MCH: 25.5 pg — ABNORMAL LOW (ref 27.2–33.4)
MCHC: 29.7 g/dL — ABNORMAL LOW (ref 32.0–36.0)
MCV: 85.9 fL (ref 79.3–98.0)
MONO%: 5.5 % (ref 0.0–14.0)
RBC: 4.98 10*6/uL (ref 4.20–5.82)
RDW: 16 % — ABNORMAL HIGH (ref 11.0–14.6)
lymph#: 1.2 10*3/uL (ref 0.9–3.3)

## 2013-01-28 NOTE — Progress Notes (Signed)
No phlebotomy needed today due to hgb not greater than 43%.  Pt sent home.

## 2013-03-04 ENCOUNTER — Telehealth: Payer: Self-pay | Admitting: Hematology and Oncology

## 2013-03-04 ENCOUNTER — Telehealth: Payer: Self-pay | Admitting: *Deleted

## 2013-03-04 NOTE — Telephone Encounter (Signed)
, °

## 2013-03-04 NOTE — Telephone Encounter (Signed)
Per staff message and POF I have scheduled appts.  JMW  

## 2013-03-06 DIAGNOSIS — Z6829 Body mass index (BMI) 29.0-29.9, adult: Secondary | ICD-10-CM | POA: Diagnosis not present

## 2013-03-06 DIAGNOSIS — M7989 Other specified soft tissue disorders: Secondary | ICD-10-CM | POA: Diagnosis not present

## 2013-03-06 DIAGNOSIS — R609 Edema, unspecified: Secondary | ICD-10-CM | POA: Diagnosis not present

## 2013-03-06 DIAGNOSIS — I82819 Embolism and thrombosis of superficial veins of unspecified lower extremities: Secondary | ICD-10-CM | POA: Diagnosis not present

## 2013-03-10 ENCOUNTER — Ambulatory Visit: Payer: Medicare Other

## 2013-03-10 ENCOUNTER — Other Ambulatory Visit: Payer: Medicare Other | Admitting: Lab

## 2013-03-13 ENCOUNTER — Other Ambulatory Visit: Payer: Self-pay | Admitting: *Deleted

## 2013-03-13 DIAGNOSIS — D45 Polycythemia vera: Secondary | ICD-10-CM

## 2013-03-14 ENCOUNTER — Other Ambulatory Visit: Payer: Self-pay

## 2013-03-14 ENCOUNTER — Other Ambulatory Visit (HOSPITAL_BASED_OUTPATIENT_CLINIC_OR_DEPARTMENT_OTHER): Payer: Medicare Other | Admitting: Lab

## 2013-03-14 ENCOUNTER — Encounter: Payer: Self-pay | Admitting: Hematology and Oncology

## 2013-03-14 ENCOUNTER — Ambulatory Visit (HOSPITAL_BASED_OUTPATIENT_CLINIC_OR_DEPARTMENT_OTHER): Payer: Medicare Other | Admitting: Hematology and Oncology

## 2013-03-14 VITALS — BP 150/76 | HR 73 | Temp 97.6°F | Resp 18 | Ht 71.0 in | Wt 206.3 lb

## 2013-03-14 DIAGNOSIS — R5381 Other malaise: Secondary | ICD-10-CM | POA: Diagnosis not present

## 2013-03-14 DIAGNOSIS — L03116 Cellulitis of left lower limb: Secondary | ICD-10-CM

## 2013-03-14 DIAGNOSIS — D696 Thrombocytopenia, unspecified: Secondary | ICD-10-CM | POA: Diagnosis not present

## 2013-03-14 DIAGNOSIS — M7989 Other specified soft tissue disorders: Secondary | ICD-10-CM | POA: Diagnosis not present

## 2013-03-14 DIAGNOSIS — D729 Disorder of white blood cells, unspecified: Secondary | ICD-10-CM

## 2013-03-14 DIAGNOSIS — D7289 Other specified disorders of white blood cells: Secondary | ICD-10-CM | POA: Diagnosis not present

## 2013-03-14 DIAGNOSIS — D45 Polycythemia vera: Secondary | ICD-10-CM | POA: Diagnosis not present

## 2013-03-14 LAB — COMPREHENSIVE METABOLIC PANEL (CC13)
Albumin: 3.5 g/dL (ref 3.5–5.0)
BUN: 22.2 mg/dL (ref 7.0–26.0)
CO2: 27 mEq/L (ref 22–29)
Calcium: 8.9 mg/dL (ref 8.4–10.4)
Chloride: 105 mEq/L (ref 98–109)
Glucose: 100 mg/dl (ref 70–140)
Potassium: 4.5 mEq/L (ref 3.5–5.1)
Sodium: 140 mEq/L (ref 136–145)
Total Protein: 5.9 g/dL — ABNORMAL LOW (ref 6.4–8.3)

## 2013-03-14 LAB — CBC WITH DIFFERENTIAL/PLATELET
Basophils Absolute: 0.4 10*3/uL — ABNORMAL HIGH (ref 0.0–0.1)
Eosinophils Absolute: 0.2 10*3/uL (ref 0.0–0.5)
HGB: 13.1 g/dL (ref 13.0–17.1)
MCV: 82.9 fL (ref 79.3–98.0)
MONO#: 0.8 10*3/uL (ref 0.1–0.9)
NEUT#: 17.4 10*3/uL — ABNORMAL HIGH (ref 1.5–6.5)
RBC: 5.18 10*6/uL (ref 4.20–5.82)
RDW: 16.8 % — ABNORMAL HIGH (ref 11.0–14.6)
WBC: 19.8 10*3/uL — ABNORMAL HIGH (ref 4.0–10.3)
lymph#: 1 10*3/uL (ref 0.9–3.3)

## 2013-03-14 MED ORDER — CEPHALEXIN 500 MG PO CAPS: 500.0000 mg | ORAL_CAPSULE | Freq: Four times a day (QID) | ORAL | Status: DC

## 2013-03-18 ENCOUNTER — Telehealth: Payer: Self-pay | Admitting: Hematology and Oncology

## 2013-03-18 NOTE — Telephone Encounter (Signed)
, °

## 2013-03-20 NOTE — Progress Notes (Signed)
ID: [name] [name] OB: 23-Jan-1924  MR#: [id number]  WUJ#:[id number]  [facility]  Telephone:([phone]) [phone] [phone]   OFFICE PROGRESS NOTE  PCP: Hoyle Sauer, MD   DIAGNOSIS: Polycythemia vera dating back to 1997, JAK2 mutation positive.   CURRENT THERAPY: Phlebotomy and hydroxyurea.  INTERVAL HISTORY: [name] [name] presented today for regular follow up visit. He feels tired but overall is doing well. His appetite is "too good".He complaints on left leg swelling.The patient denied fever, chills, night sweats, change in appetite or weight. He denied headaches, double vision, blurry vision, nasal congestion, nasal discharge, hearing problems, odynophagia or dysphagia. No chest pain, palpitations, dyspnea, cough, abdominal pain, nausea, vomiting, diarrhea, constipation, hematochezia. The patient denied dysuria, nocturia, polyuria, hematuria, myalgia, numbness, tingling, psychiatric problems.  Review of Systems  Constitutional: Positive for malaise/fatigue. Negative for fever, chills, weight loss and diaphoresis.  HENT: Negative for hearing loss, ear pain, nosebleeds, congestion, sore throat, neck pain and tinnitus.   Eyes: Negative for blurred vision, double vision, photophobia, pain and discharge.  Respiratory: Negative for cough, hemoptysis, sputum production, shortness of breath, wheezing and stridor.   Cardiovascular: Positive for leg swelling. Negative for chest pain, palpitations, orthopnea and claudication.  Gastrointestinal: Negative for heartburn, nausea, vomiting, abdominal pain, diarrhea, constipation, blood in stool and melena.  Genitourinary: Negative for dysuria, urgency, frequency and hematuria.  Musculoskeletal: Negative for myalgias, back pain and joint pain.  Skin: Negative for itching and rash.  Neurological: Negative for dizziness, tingling, tremors, sensory change, speech change, focal weakness, seizures, loss of consciousness, weakness and  headaches.  Endo/Heme/Allergies: Does not bruise/bleed easily.  Psychiatric/Behavioral: Negative.    PAST MEDICAL HISTORY: Past Medical History  Diagnosis Date  . Polycythemia vera(238.4)   . Gout   . HTN (hypertension)   . Depression   . BPH (benign prostatic hypertrophy)   . Dyslipidemia   . Mild aortic stenosis   . DJD (degenerative joint disease)   . Bone marrow disease     PAST SURGICAL HISTORY: Past Surgical History  Procedure Laterality Date  . Hemorroidectomy      FAMILY HISTORY Family History  Problem Relation Age of Onset  . Hypertension Mother     HEALTH MAINTENANCE: History  Substance Use Topics  . Smoking status: Never Smoker   . Smokeless tobacco: Never Used  . Alcohol Use: 4.2 oz/week    7 Glasses of wine per week     Comment: red wine    Allergies  Allergen Reactions  . Colchicine     Current Outpatient Prescriptions  Medication Sig Dispense Refill  . allopurinol (ZYLOPRIM) 100 MG tablet Take 200 mg by mouth daily.      Marland Kitchen ALPRAZolam (XANAX) 0.5 MG tablet Take 0.5 mg by mouth as needed.      Marland Kitchen aspirin 325 MG buffered tablet Take 325 mg by mouth daily. Take 1/2 tablet daily      . escitalopram (LEXAPRO) 10 MG tablet Take 5 mg by mouth daily.       . fish oil-omega-3 fatty acids 1000 MG capsule Take 1 g by mouth daily.      . hydroxyurea (HYDREA) 500 MG capsule Take 500 mg by mouth daily. May take with food to minimize GI side effects.      Marland Kitchen lisinopril (PRINIVIL,ZESTRIL) 10 MG tablet Take 10 mg by mouth daily.      . Multiple Vitamin (MULTIVITAMIN) tablet Take 1 tablet by mouth daily.      Marland Kitchen terazosin (HYTRIN) 5  MG capsule Take 5 mg by mouth at bedtime.      . vitamin E (VITAMIN E) 400 UNIT capsule Take 400 Units by mouth daily.      . cephALEXin (KEFLEX) 500 MG capsule Take 1 capsule (500 mg total) by mouth 4 (four) times daily.  40 capsule  0   No current facility-administered medications for this visit.    OBJECTIVE: Filed Vitals:    03/14/13 1512  BP: 150/76  Pulse: 73  Temp: 97.6 F (36.4 C)  Resp: 18     Body mass index is 28.79 kg/(m^2).     PHYSICAL EXAMINATION:  HEENT: Sclerae anicteric.  Conjunctivae were pink. Pupils round and reactive bilaterally. Oral mucosa is moist without ulceration or thrush. No occipital, submandibular, cervical, supraclavicular or axillar adenopathy. Lungs: clear to auscultation without wheezes. No rales or rhonchi. Heart: regular rate and rhythm. No  gallop or rubs.  2/6 systolic murmur. Abdomen: soft, non tender. No guarding or rebound tenderness. Bowel sounds are present. No palpable hepatosplenomegaly. MSK: no focal spinal tenderness. Extremities: No clubbing or cyanosis.No calf tenderness to palpitation, no peripheral edema. The patient had grossly intact strength in upper and lower extremities. Skin exam was without ecchymosis, petechiae. Neuro: non-focal, alert and oriented to time, person and place, appropriate affect  LAB RESULTS:  CMP     Component Value Date/Time   NA 140 03/14/2013 1419   NA 139 01/05/2012 1040   K 4.5 03/14/2013 1419   K 4.3 01/05/2012 1040   CL 105 09/10/2012 1133   CL 105 01/05/2012 1040   CO2 27 03/14/2013 1419   CO2 25 01/05/2012 1040   GLUCOSE 100 03/14/2013 1419   GLUCOSE 119* 09/10/2012 1133   GLUCOSE 83 01/05/2012 1040   BUN 22.2 03/14/2013 1419   BUN 24* 01/05/2012 1040   CREATININE 1.2 03/14/2013 1419   CREATININE 1.16 01/05/2012 1040   CALCIUM 8.9 03/14/2013 1419   CALCIUM 9.1 01/05/2012 1040   PROT 5.9* 03/14/2013 1419   PROT 5.5* 01/05/2012 1040   ALBUMIN 3.5 03/14/2013 1419   ALBUMIN 3.9 01/05/2012 1040   AST 23 03/14/2013 1419   AST 20 01/05/2012 1040   ALT 18 03/14/2013 1419   ALT 17 01/05/2012 1040   ALKPHOS 71 03/14/2013 1419   ALKPHOS 64 01/05/2012 1040   BILITOT 0.56 03/14/2013 1419   BILITOT 0.5 01/05/2012 1040    Lab Results  Component Value Date   WBC 19.8* 03/14/2013   NEUTROABS 17.4* 03/14/2013   HGB 13.1 03/14/2013   HCT 42.9  03/14/2013   MCV 82.9 03/14/2013   PLT 129* 03/14/2013      Chemistry      Component Value Date/Time   NA 140 03/14/2013 1419   NA 139 01/05/2012 1040   K 4.5 03/14/2013 1419   K 4.3 01/05/2012 1040   CL 105 09/10/2012 1133   CL 105 01/05/2012 1040   CO2 27 03/14/2013 1419   CO2 25 01/05/2012 1040   BUN 22.2 03/14/2013 1419   BUN 24* 01/05/2012 1040   CREATININE 1.2 03/14/2013 1419   CREATININE 1.16 01/05/2012 1040      Component Value Date/Time   CALCIUM 8.9 03/14/2013 1419   CALCIUM 9.1 01/05/2012 1040   ALKPHOS 71 03/14/2013 1419   ALKPHOS 64 01/05/2012 1040   AST 23 03/14/2013 1419   AST 20 01/05/2012 1040   ALT 18 03/14/2013 1419   ALT 17 01/05/2012 1040   BILITOT 0.56 03/14/2013 1419   BILITOT 0.5  01/05/2012 1040       STUDIES: No results found.  ASSESSMENT AND PLAN: 1. Polycythemia vera dating back to 1997, JAK2 mutation positive.  - Continue aspirin. - Continue Hydroxyurea 500 mg p.o. daily.  - CBC monthly - Continue phlebotomy. 2. Neutrophilia secondary to polycythemia vera. 3. Thrombocytopenia secondary to Hydroxyurea. 4. Follow up in 6 month.  Myra Rude, MD   03/20/2013 7:51 PM

## 2013-03-26 DIAGNOSIS — Z23 Encounter for immunization: Secondary | ICD-10-CM | POA: Diagnosis not present

## 2013-03-26 DIAGNOSIS — M109 Gout, unspecified: Secondary | ICD-10-CM | POA: Diagnosis not present

## 2013-03-26 DIAGNOSIS — R7301 Impaired fasting glucose: Secondary | ICD-10-CM | POA: Diagnosis not present

## 2013-03-26 DIAGNOSIS — L02419 Cutaneous abscess of limb, unspecified: Secondary | ICD-10-CM | POA: Diagnosis not present

## 2013-03-26 DIAGNOSIS — I359 Nonrheumatic aortic valve disorder, unspecified: Secondary | ICD-10-CM | POA: Diagnosis not present

## 2013-03-26 DIAGNOSIS — I10 Essential (primary) hypertension: Secondary | ICD-10-CM | POA: Diagnosis not present

## 2013-03-26 DIAGNOSIS — M25519 Pain in unspecified shoulder: Secondary | ICD-10-CM | POA: Diagnosis not present

## 2013-03-26 DIAGNOSIS — D751 Secondary polycythemia: Secondary | ICD-10-CM | POA: Diagnosis not present

## 2013-04-10 ENCOUNTER — Other Ambulatory Visit: Payer: Self-pay | Admitting: Medical Oncology

## 2013-04-10 DIAGNOSIS — D45 Polycythemia vera: Secondary | ICD-10-CM

## 2013-04-11 ENCOUNTER — Other Ambulatory Visit: Payer: Self-pay | Admitting: Medical Oncology

## 2013-04-11 ENCOUNTER — Ambulatory Visit (HOSPITAL_BASED_OUTPATIENT_CLINIC_OR_DEPARTMENT_OTHER): Payer: Medicare Other

## 2013-04-11 ENCOUNTER — Other Ambulatory Visit (HOSPITAL_BASED_OUTPATIENT_CLINIC_OR_DEPARTMENT_OTHER): Payer: Medicare Other | Admitting: Lab

## 2013-04-11 DIAGNOSIS — D45 Polycythemia vera: Secondary | ICD-10-CM

## 2013-04-11 LAB — CBC WITH DIFFERENTIAL/PLATELET
BASO%: 1.2 % (ref 0.0–2.0)
Eosinophils Absolute: 0.2 10*3/uL (ref 0.0–0.5)
LYMPH%: 5.4 % — ABNORMAL LOW (ref 14.0–49.0)
MCHC: 30.3 g/dL — ABNORMAL LOW (ref 32.0–36.0)
MONO#: 1 10*3/uL — ABNORMAL HIGH (ref 0.1–0.9)
MONO%: 4.7 % (ref 0.0–14.0)
NEUT#: 18.9 10*3/uL — ABNORMAL HIGH (ref 1.5–6.5)
Platelets: 131 10*3/uL — ABNORMAL LOW (ref 140–400)
RBC: 5.33 10*6/uL (ref 4.20–5.82)
RDW: 16.5 % — ABNORMAL HIGH (ref 11.0–14.6)
WBC: 21.5 10*3/uL — ABNORMAL HIGH (ref 4.0–10.3)

## 2013-04-11 NOTE — Patient Instructions (Signed)

## 2013-05-09 ENCOUNTER — Ambulatory Visit (HOSPITAL_BASED_OUTPATIENT_CLINIC_OR_DEPARTMENT_OTHER): Payer: Medicare Other

## 2013-05-09 ENCOUNTER — Other Ambulatory Visit (HOSPITAL_BASED_OUTPATIENT_CLINIC_OR_DEPARTMENT_OTHER): Payer: Medicare Other | Admitting: Lab

## 2013-05-09 VITALS — BP 126/71 | HR 75 | Temp 97.0°F | Resp 18

## 2013-05-09 DIAGNOSIS — D45 Polycythemia vera: Secondary | ICD-10-CM | POA: Diagnosis not present

## 2013-05-09 LAB — CBC WITH DIFFERENTIAL/PLATELET
BASO%: 1.4 % (ref 0.0–2.0)
Eosinophils Absolute: 0.2 10*3/uL (ref 0.0–0.5)
HCT: 45.1 % (ref 38.4–49.9)
MCHC: 29.9 g/dL — ABNORMAL LOW (ref 32.0–36.0)
MONO#: 0.9 10*3/uL (ref 0.1–0.9)
NEUT#: 16.9 10*3/uL — ABNORMAL HIGH (ref 1.5–6.5)
Platelets: 119 10*3/uL — ABNORMAL LOW (ref 140–400)
RBC: 5.19 10*6/uL (ref 4.20–5.82)
WBC: 19.6 10*3/uL — ABNORMAL HIGH (ref 4.0–10.3)
lymph#: 1.3 10*3/uL (ref 0.9–3.3)
nRBC: 0 % (ref 0–0)

## 2013-05-09 NOTE — Progress Notes (Signed)
1350 Hct today @ 45.1. Per MD notes goal is less than 43. VSS. Phlebotomy initiated at 1350 with phlebotomy kit to Left AC. 1355- 520 cc's phlebotomized. Patient tolerated treatment well with no complaints. Patient to stay 30 minutes for observation.  1425 VSS. 30 minute observation completed. Patient doing well with no complaints. Discharged ambulating. AVS provided. Knows to call clinic with any questions or concerns.

## 2013-05-09 NOTE — Patient Instructions (Signed)
Therapeutic Phlebotomy Therapeutic phlebotomy is the controlled removal of blood from your body for the purpose of treating a medical condition. It is similar to donating blood. Usually, about a pint (470 mL) of blood is removed. The average adult has 9 to 12 pints (4.3 to 5.7 L) of blood. Therapeutic phlebotomy may be used to treat the following medical conditions:  Hemochromatosis. This is a condition in which there is too much iron in the blood.  Polycythemia vera. This is a condition in which there are too many red cells in the blood.  Porphyria cutanea tarda. This is a disease usually passed from one generation to the next (inherited). It is a condition in which an important part of hemoglobin is not made properly. This results in the build up of abnormal amounts of porphyrins in the body.  Sickle cell disease. This is an inherited disease. It is a condition in which the red blood cells form an abnormal crescent shape rather than a round shape. LET YOUR CAREGIVER KNOW ABOUT:  Allergies.  Medicines taken including herbs, eyedrops, over-the-counter medicines, and creams.  Use of steroids (by mouth or creams).  Previous problems with anesthetics or numbing medicine.  History of blood clots.  History of bleeding or blood problems.  Previous surgery.  Possibility of pregnancy, if this applies. RISKS AND COMPLICATIONS This is a simple and safe procedure. Problems are unlikely. However, problems can occur and may include:  Nausea or lightheadedness.  Low blood pressure.  Soreness, bleeding, swelling, or bruising at the needle insertion site.  Infection. BEFORE THE PROCEDURE  This is a procedure that can be done as an outpatient. Confirm the time that you need to arrive for your procedure. Confirm whether there is a need to fast or withhold any medications. It is helpful to wear clothing with sleeves that can be raised above the elbow. A blood sample may be done to determine the  amount of red blood cells or iron in your blood. Plan ahead of time to have someone drive you home after the procedure. PROCEDURE The entire procedure from preparation through recovery takes about 1 hour. The actual collection takes about 10 to 15 minutes.  A needle will be inserted into your vein.  Tubing and a collection bag will be attached to that needle.  Blood will flow through the needle and tubing into the collection bag.  You may be asked to open and close your hand slowly and continuously during the entire collection.  Once the specified amount of blood has been removed from your body, the collection bag and tubing will be clamped.  The needle will be removed.  Pressure will be held on the site of the needle insertion to stop the bleeding. Then a bandage will be placed over the needle insertion site. AFTER THE PROCEDURE  Your recovery will be assessed and monitored. If there are no problems, as an outpatient, you should be able to go home shortly after the procedure.  Document Released: 12/05/2010 Document Revised: 09/25/2011 Document Reviewed: 12/05/2010 [employer] Patient Information 2014 [employer].  

## 2013-06-06 ENCOUNTER — Ambulatory Visit: Payer: Medicare Other

## 2013-06-06 ENCOUNTER — Other Ambulatory Visit (HOSPITAL_BASED_OUTPATIENT_CLINIC_OR_DEPARTMENT_OTHER): Payer: Medicare Other | Admitting: Lab

## 2013-06-06 DIAGNOSIS — D45 Polycythemia vera: Secondary | ICD-10-CM | POA: Diagnosis not present

## 2013-06-06 LAB — CBC WITH DIFFERENTIAL/PLATELET
Basophils Absolute: 0 10*3/uL (ref 0.0–0.1)
Eosinophils Absolute: 0.2 10*3/uL (ref 0.0–0.5)
HCT: 42.8 % (ref 38.4–49.9)
HGB: 12.2 g/dL — ABNORMAL LOW (ref 13.0–17.1)
MCV: 85.6 fL (ref 79.3–98.0)
MONO#: 1 10*3/uL — ABNORMAL HIGH (ref 0.1–0.9)
MONO%: 5.1 % (ref 0.0–14.0)
NEUT#: 17.7 10*3/uL — ABNORMAL HIGH (ref 1.5–6.5)
NEUT%: 87.7 % — ABNORMAL HIGH (ref 39.0–75.0)
RBC: 5 10*6/uL (ref 4.20–5.82)
RDW: 16.5 % — ABNORMAL HIGH (ref 11.0–14.6)
lymph#: 1.2 10*3/uL (ref 0.9–3.3)

## 2013-06-06 NOTE — Progress Notes (Signed)
Hct 42.8.  Spoke with pt in the lobby, goal is < or equal to 43%.  Pt states he is currently asymptomatic.  No complaints.  Pt aware of appt next month.  SLJ

## 2013-07-04 ENCOUNTER — Ambulatory Visit (HOSPITAL_BASED_OUTPATIENT_CLINIC_OR_DEPARTMENT_OTHER): Payer: Medicare Other

## 2013-07-04 ENCOUNTER — Other Ambulatory Visit (HOSPITAL_BASED_OUTPATIENT_CLINIC_OR_DEPARTMENT_OTHER): Payer: Medicare Other

## 2013-07-04 DIAGNOSIS — D45 Polycythemia vera: Secondary | ICD-10-CM

## 2013-07-04 LAB — CBC WITH DIFFERENTIAL/PLATELET
BASO%: 1.4 % (ref 0.0–2.0)
Basophils Absolute: 0.3 10*3/uL — ABNORMAL HIGH (ref 0.0–0.1)
Eosinophils Absolute: 0.2 10*3/uL (ref 0.0–0.5)
HCT: 45.7 % (ref 38.4–49.9)
HGB: 13.6 g/dL (ref 13.0–17.1)
LYMPH%: 5.9 % — ABNORMAL LOW (ref 14.0–49.0)
MCV: 87.7 fL (ref 79.3–98.0)
MONO%: 3.6 % (ref 0.0–14.0)
NEUT#: 18 10*3/uL — ABNORMAL HIGH (ref 1.5–6.5)
NEUT%: 88.2 % — ABNORMAL HIGH (ref 39.0–75.0)
Platelets: 94 10*3/uL — ABNORMAL LOW (ref 140–400)
lymph#: 1.2 10*3/uL (ref 0.9–3.3)

## 2013-07-04 NOTE — Progress Notes (Signed)
Obtain 519cc of blood via phlebotomy x 1 stick in R anticubital. Pt tolerated procedure well. Pt drank 8oz cola and had peanut butter crackers post procedure. Monitored x 30 minutes and discharged without issues.

## 2013-08-01 ENCOUNTER — Other Ambulatory Visit (HOSPITAL_BASED_OUTPATIENT_CLINIC_OR_DEPARTMENT_OTHER): Payer: Medicare Other

## 2013-08-01 ENCOUNTER — Other Ambulatory Visit: Payer: Self-pay | Admitting: Internal Medicine

## 2013-08-01 ENCOUNTER — Ambulatory Visit (HOSPITAL_BASED_OUTPATIENT_CLINIC_OR_DEPARTMENT_OTHER): Payer: Medicare Other

## 2013-08-01 ENCOUNTER — Other Ambulatory Visit: Payer: Self-pay

## 2013-08-01 VITALS — BP 101/65 | HR 85 | Temp 97.6°F | Resp 20

## 2013-08-01 DIAGNOSIS — D45 Polycythemia vera: Secondary | ICD-10-CM

## 2013-08-01 LAB — CBC WITH DIFFERENTIAL/PLATELET
BASO%: 1.6 % (ref 0.0–2.0)
Basophils Absolute: 0.4 10*3/uL — ABNORMAL HIGH (ref 0.0–0.1)
EOS ABS: 0.3 10*3/uL (ref 0.0–0.5)
EOS%: 1.1 % (ref 0.0–7.0)
HCT: 45.9 % (ref 38.4–49.9)
HGB: 13.6 g/dL (ref 13.0–17.1)
LYMPH#: 1.4 10*3/uL (ref 0.9–3.3)
LYMPH%: 5.9 % — ABNORMAL LOW (ref 14.0–49.0)
MCH: 26 pg — ABNORMAL LOW (ref 27.2–33.4)
MCHC: 29.6 g/dL — ABNORMAL LOW (ref 32.0–36.0)
MCV: 87.6 fL (ref 79.3–98.0)
MONO#: 1.4 10*3/uL — ABNORMAL HIGH (ref 0.1–0.9)
MONO%: 6.1 % (ref 0.0–14.0)
NEUT#: 19.8 10*3/uL — ABNORMAL HIGH (ref 1.5–6.5)
NEUT%: 85.3 % — ABNORMAL HIGH (ref 39.0–75.0)
NRBC: 0 % (ref 0–0)
Platelets: 104 10*3/uL — ABNORMAL LOW (ref 140–400)
RBC: 5.24 10*6/uL (ref 4.20–5.82)
RDW: 16.1 % — AB (ref 11.0–14.6)
WBC: 23.2 10*3/uL — ABNORMAL HIGH (ref 4.0–10.3)

## 2013-08-01 NOTE — Progress Notes (Signed)
Phlebotomized 500 mls from the right AC. Patient tolerated well. VSS. Patient was observed for 30 minutes post phlebotomy. Also, he ate and drank.

## 2013-08-01 NOTE — Patient Instructions (Signed)

## 2013-08-19 ENCOUNTER — Telehealth: Payer: Self-pay | Admitting: Internal Medicine

## 2013-08-19 NOTE — Telephone Encounter (Signed)
s.w. pt and advised on time change for 3.13.15...Marland Kitchenpt ok and aware

## 2013-08-28 DIAGNOSIS — R109 Unspecified abdominal pain: Secondary | ICD-10-CM | POA: Diagnosis not present

## 2013-08-28 DIAGNOSIS — D751 Secondary polycythemia: Secondary | ICD-10-CM | POA: Diagnosis not present

## 2013-08-28 DIAGNOSIS — Z1331 Encounter for screening for depression: Secondary | ICD-10-CM | POA: Diagnosis not present

## 2013-08-28 DIAGNOSIS — Z6828 Body mass index (BMI) 28.0-28.9, adult: Secondary | ICD-10-CM | POA: Diagnosis not present

## 2013-08-29 ENCOUNTER — Other Ambulatory Visit (HOSPITAL_BASED_OUTPATIENT_CLINIC_OR_DEPARTMENT_OTHER): Payer: Medicare Other

## 2013-08-29 ENCOUNTER — Other Ambulatory Visit: Payer: Self-pay | Admitting: Internal Medicine

## 2013-08-29 ENCOUNTER — Ambulatory Visit: Payer: Medicare Other

## 2013-08-29 DIAGNOSIS — D45 Polycythemia vera: Secondary | ICD-10-CM | POA: Diagnosis not present

## 2013-08-29 LAB — CBC WITH DIFFERENTIAL/PLATELET
BASO%: 1.1 % (ref 0.0–2.0)
Basophils Absolute: 0.3 10*3/uL — ABNORMAL HIGH (ref 0.0–0.1)
EOS ABS: 0.2 10*3/uL (ref 0.0–0.5)
EOS%: 0.6 % (ref 0.0–7.0)
HEMATOCRIT: 41.3 % (ref 38.4–49.9)
HGB: 12.1 g/dL — ABNORMAL LOW (ref 13.0–17.1)
LYMPH%: 4.5 % — AB (ref 14.0–49.0)
MCH: 25.1 pg — ABNORMAL LOW (ref 27.2–33.4)
MCHC: 29.2 g/dL — ABNORMAL LOW (ref 32.0–36.0)
MCV: 85.9 fL (ref 79.3–98.0)
MONO#: 1.4 10*3/uL — AB (ref 0.1–0.9)
MONO%: 5.8 % (ref 0.0–14.0)
NEUT%: 88 % — ABNORMAL HIGH (ref 39.0–75.0)
NEUTROS ABS: 21.7 10*3/uL — AB (ref 1.5–6.5)
PLATELETS: 102 10*3/uL — AB (ref 140–400)
RBC: 4.81 10*6/uL (ref 4.20–5.82)
RDW: 16.2 % — ABNORMAL HIGH (ref 11.0–14.6)
WBC: 24.6 10*3/uL — ABNORMAL HIGH (ref 4.0–10.3)
lymph#: 1.1 10*3/uL (ref 0.9–3.3)

## 2013-08-29 NOTE — Progress Notes (Signed)
Hct 41.3 no need for phlebotomy today, spoke with patient in lobby.

## 2013-09-08 DIAGNOSIS — R7301 Impaired fasting glucose: Secondary | ICD-10-CM | POA: Diagnosis not present

## 2013-09-08 DIAGNOSIS — M7989 Other specified soft tissue disorders: Secondary | ICD-10-CM | POA: Diagnosis not present

## 2013-09-08 DIAGNOSIS — R609 Edema, unspecified: Secondary | ICD-10-CM | POA: Diagnosis not present

## 2013-09-08 DIAGNOSIS — Z6828 Body mass index (BMI) 28.0-28.9, adult: Secondary | ICD-10-CM | POA: Diagnosis not present

## 2013-09-08 DIAGNOSIS — I824Y9 Acute embolism and thrombosis of unspecified deep veins of unspecified proximal lower extremity: Secondary | ICD-10-CM | POA: Diagnosis not present

## 2013-09-08 DIAGNOSIS — R109 Unspecified abdominal pain: Secondary | ICD-10-CM | POA: Diagnosis not present

## 2013-09-17 DIAGNOSIS — E785 Hyperlipidemia, unspecified: Secondary | ICD-10-CM | POA: Diagnosis not present

## 2013-09-17 DIAGNOSIS — Z125 Encounter for screening for malignant neoplasm of prostate: Secondary | ICD-10-CM | POA: Diagnosis not present

## 2013-09-17 DIAGNOSIS — M109 Gout, unspecified: Secondary | ICD-10-CM | POA: Diagnosis not present

## 2013-09-17 DIAGNOSIS — I10 Essential (primary) hypertension: Secondary | ICD-10-CM | POA: Diagnosis not present

## 2013-09-17 DIAGNOSIS — R7301 Impaired fasting glucose: Secondary | ICD-10-CM | POA: Diagnosis not present

## 2013-09-24 DIAGNOSIS — Z0000 Encounter for general adult medical examination without abnormal findings: Secondary | ICD-10-CM | POA: Diagnosis not present

## 2013-09-24 DIAGNOSIS — D751 Secondary polycythemia: Secondary | ICD-10-CM | POA: Diagnosis not present

## 2013-09-24 DIAGNOSIS — I82409 Acute embolism and thrombosis of unspecified deep veins of unspecified lower extremity: Secondary | ICD-10-CM | POA: Diagnosis not present

## 2013-09-24 DIAGNOSIS — I359 Nonrheumatic aortic valve disorder, unspecified: Secondary | ICD-10-CM | POA: Diagnosis not present

## 2013-09-24 DIAGNOSIS — R7301 Impaired fasting glucose: Secondary | ICD-10-CM | POA: Diagnosis not present

## 2013-09-24 DIAGNOSIS — M109 Gout, unspecified: Secondary | ICD-10-CM | POA: Diagnosis not present

## 2013-09-24 DIAGNOSIS — I720 Aneurysm of carotid artery: Secondary | ICD-10-CM | POA: Diagnosis not present

## 2013-09-24 DIAGNOSIS — R972 Elevated prostate specific antigen [PSA]: Secondary | ICD-10-CM | POA: Diagnosis not present

## 2013-09-26 ENCOUNTER — Ambulatory Visit (HOSPITAL_BASED_OUTPATIENT_CLINIC_OR_DEPARTMENT_OTHER): Payer: Medicare Other

## 2013-09-26 ENCOUNTER — Telehealth: Payer: Self-pay | Admitting: Internal Medicine

## 2013-09-26 ENCOUNTER — Other Ambulatory Visit: Payer: Medicare Other

## 2013-09-26 ENCOUNTER — Ambulatory Visit (HOSPITAL_BASED_OUTPATIENT_CLINIC_OR_DEPARTMENT_OTHER): Payer: Medicare Other | Admitting: Internal Medicine

## 2013-09-26 ENCOUNTER — Other Ambulatory Visit (HOSPITAL_BASED_OUTPATIENT_CLINIC_OR_DEPARTMENT_OTHER): Payer: Medicare Other

## 2013-09-26 ENCOUNTER — Ambulatory Visit: Payer: Medicare Other

## 2013-09-26 ENCOUNTER — Other Ambulatory Visit: Payer: Self-pay | Admitting: Internal Medicine

## 2013-09-26 VITALS — BP 130/60 | HR 88 | Resp 18

## 2013-09-26 VITALS — BP 140/60 | HR 91 | Temp 97.8°F | Resp 18 | Ht 71.0 in | Wt 198.8 lb

## 2013-09-26 DIAGNOSIS — I82409 Acute embolism and thrombosis of unspecified deep veins of unspecified lower extremity: Secondary | ICD-10-CM | POA: Diagnosis not present

## 2013-09-26 DIAGNOSIS — D45 Polycythemia vera: Secondary | ICD-10-CM

## 2013-09-26 DIAGNOSIS — D6959 Other secondary thrombocytopenia: Secondary | ICD-10-CM

## 2013-09-26 DIAGNOSIS — D72829 Elevated white blood cell count, unspecified: Secondary | ICD-10-CM | POA: Diagnosis not present

## 2013-09-26 LAB — CBC WITH DIFFERENTIAL/PLATELET
BASO%: 1.5 % (ref 0.0–2.0)
BASOS ABS: 0.3 10*3/uL — AB (ref 0.0–0.1)
EOS%: 0.9 % (ref 0.0–7.0)
Eosinophils Absolute: 0.2 10*3/uL (ref 0.0–0.5)
HEMATOCRIT: 44.6 % (ref 38.4–49.9)
HEMOGLOBIN: 13.1 g/dL (ref 13.0–17.1)
LYMPH#: 1 10*3/uL (ref 0.9–3.3)
LYMPH%: 5.7 % — ABNORMAL LOW (ref 14.0–49.0)
MCH: 25.7 pg — AB (ref 27.2–33.4)
MCHC: 29.4 g/dL — ABNORMAL LOW (ref 32.0–36.0)
MCV: 87.5 fL (ref 79.3–98.0)
MONO#: 0.7 10*3/uL (ref 0.1–0.9)
MONO%: 4.1 % (ref 0.0–14.0)
NEUT%: 87.8 % — ABNORMAL HIGH (ref 39.0–75.0)
NEUTROS ABS: 15.3 10*3/uL — AB (ref 1.5–6.5)
Platelets: 122 10*3/uL — ABNORMAL LOW (ref 140–400)
RBC: 5.1 10*6/uL (ref 4.20–5.82)
RDW: 15.9 % — ABNORMAL HIGH (ref 11.0–14.6)
WBC: 17.5 10*3/uL — AB (ref 4.0–10.3)
nRBC: 0 % (ref 0–0)

## 2013-09-26 LAB — COMPREHENSIVE METABOLIC PANEL (CC13)
ALT: 11 U/L (ref 0–55)
ANION GAP: 9 meq/L (ref 3–11)
AST: 18 U/L (ref 5–34)
Albumin: 3.7 g/dL (ref 3.5–5.0)
Alkaline Phosphatase: 77 U/L (ref 40–150)
BILIRUBIN TOTAL: 0.58 mg/dL (ref 0.20–1.20)
BUN: 24.4 mg/dL (ref 7.0–26.0)
CO2: 28 meq/L (ref 22–29)
CREATININE: 1.2 mg/dL (ref 0.7–1.3)
Calcium: 9.3 mg/dL (ref 8.4–10.4)
Chloride: 105 mEq/L (ref 98–109)
Glucose: 116 mg/dl (ref 70–140)
Potassium: 4.7 mEq/L (ref 3.5–5.1)
Sodium: 142 mEq/L (ref 136–145)
Total Protein: 6.1 g/dL — ABNORMAL LOW (ref 6.4–8.3)

## 2013-09-26 NOTE — Progress Notes (Signed)
[facility] OFFICE PROGRESS NOTE  Tivis Ringer, MD [place], [place]. [place] [address]  DIAGNOSIS: DVT (deep venous thrombosis)  Polycythemia vera  Chief Complaint  Patient presents with  . Polycythemia vera(238.4)    CURRENT TREATMENT:  Phlebotomy and hydroxyurea. Xalreto 20 mg started about mid-February 2015.   INTERVAL HISTORY: [name] [name] 80 y.o. male with a history of Polycythemia vera dating back to 1997, JAK2 mutation positive is here for follow up.  He was last seen by Dr. Erskine Speed on 03/14/2013.  He was recently diagnosed with right lower extremity DVT.  He reported a history of pain with ambulation.  He was seen by Dr. Dagmar Hait.  He is compliant to [facility] for the last 3 weeks.  He denies any recent injuries to the leg.  He denies a prior history of blood clots.  He does report that he had a bout of diverticulitis and received antibiotic about eh same time of his right lower extremity swelling. His last phlebotomy was on 08/01/2013.   MEDICAL HISTORY: Past Medical History  Diagnosis Date  . Polycythemia vera(238.4)   . Gout   . HTN (hypertension)   . Depression   . BPH (benign prostatic hypertrophy)   . Dyslipidemia   . Mild aortic stenosis   . DJD (degenerative joint disease)   . Bone marrow disease     INTERIM HISTORY: has Polycythemia vera; Aneurysm of unspecified site; and DVT (deep venous thrombosis) on his problem list.    ALLERGIES:  is allergic to colchicine.  MEDICATIONS: has a current medication list which includes the following prescription(s): allopurinol, alprazolam, escitalopram, fish oil-omega-3 fatty acids, hydroxyurea, lisinopril, multivitamin, terazosin, vitamin e, and xarelto.  SURGICAL HISTORY:  Past Surgical History  Procedure Laterality Date  . Hemorroidectomy      REVIEW OF SYSTEMS:   Constitutional: Denies fevers, chills or abnormal weight loss Eyes: Denies blurriness of  vision Ears, nose, mouth, throat, and face: Denies mucositis or sore throat Respiratory: Denies cough, dyspnea or wheezes Cardiovascular: Denies palpitation, chest discomfort or lower extremity swelling Gastrointestinal:  Denies nausea, heartburn or change in bowel habits Skin: Denies abnormal skin rashes Lymphatics: Denies new lymphadenopathy or easy bruising Neurological:Denies numbness, tingling or new weaknesses Behavioral/Psych: Mood is stable, no new changes  All other systems were reviewed with the patient and are negative.  PHYSICAL EXAMINATION: ECOG PERFORMANCE STATUS: 0 - Asymptomatic  Blood pressure 140/60, pulse 91, temperature 97.8 F (36.6 C), temperature source Oral, resp. rate 18, height 5\' 11"  (1.803 m), weight 198 lb 12.8 oz (90.175 kg), SpO2 98.00%.  General: Easily ambulatory.  HEENT: Sclerae anicteric. Conjunctivae were pink. Pupils round and reactive bilaterally. Oral mucosa is moist without ulceration or thrush.  No occipital, submandibular, cervical, supraclavicular or axillar adenopathy.  Lungs: clear to auscultation without wheezes. No rales or rhonchi.  Heart: regular rate and rhythm. No gallop or rubs. 2/6 systolic murmur.  Abdomen: soft, non tender. No guarding or rebound tenderness. Bowel sounds are present. No palpable hepatosplenomegaly.  MSK: no focal spinal tenderness.  Extremities: No clubbing or cyanosis.No calf tenderness to palpitation, no peripheral edema. The patient had grossly intact strength in upper and lower extremities. R calf measured 18 and 1/8 inches; L calf measured 15 and 3/4 inches.  Skin exam was without ecchymosis, petechiae.  Neuro: non-focal, alert and oriented to time, person and place, appropriate affect   Labs:  Lab Results  Component Value Date   WBC 17.5* 09/26/2013  HGB 13.1 09/26/2013   HCT 44.6 09/26/2013   MCV 87.5 09/26/2013   PLT 122* 09/26/2013   NEUTROABS 15.3* 09/26/2013      Chemistry      Component Value  Date/Time   NA 142 09/26/2013 1050   NA 139 01/05/2012 1040   K 4.7 09/26/2013 1050   K 4.3 01/05/2012 1040   CL 105 09/10/2012 1133   CL 105 01/05/2012 1040   CO2 28 09/26/2013 1050   CO2 25 01/05/2012 1040   BUN 24.4 09/26/2013 1050   BUN 24* 01/05/2012 1040   CREATININE 1.2 09/26/2013 1050   CREATININE 1.16 01/05/2012 1040      Component Value Date/Time   CALCIUM 9.3 09/26/2013 1050   CALCIUM 9.1 01/05/2012 1040   ALKPHOS 77 09/26/2013 1050   ALKPHOS 64 01/05/2012 1040   AST 18 09/26/2013 1050   AST 20 01/05/2012 1040   ALT 11 09/26/2013 1050   ALT 17 01/05/2012 1040   BILITOT 0.58 09/26/2013 1050   BILITOT 0.5 01/05/2012 1040       Basic Metabolic Panel:  Recent Labs Lab 09/26/13 1050  NA 142  K 4.7  CO2 28  GLUCOSE 116  BUN 24.4  CREATININE 1.2  CALCIUM 9.3   GFR Estimated Creatinine Clearance: 46.2 ml/min (by C-G formula based on Cr of 1.2). Liver Function Tests:  Recent Labs Lab 09/26/13 1050  AST 18  ALT 11  ALKPHOS 77  BILITOT 0.58  PROT 6.1*  ALBUMIN 3.7    CBC:  Recent Labs Lab 09/26/13 1050  WBC 17.5*  NEUTROABS 15.3*  HGB 13.1  HCT 44.6  MCV 87.5  PLT 122*    Studies:  No results found.   RADIOGRAPHIC STUDIES: No results found.  ASSESSMENT: [name] [name] 80 y.o. male with a history of DVT (deep venous thrombosis)  Polycythemia vera   PLAN:  1. Polycythemia vera dating back to 1997, JAK2 mutation positive.  - Continue aspirin.  - Continue Hydroxyurea 500 mg p.o. [name].  - CBC monthly  - Continue phlebotomy hct greater than 43. He will require a phlebotomy today.   2. RLE DVT likely secondary to #1.  --Continue Xalreto 20 mg [name].  3. Neutrophilia secondary to polycythemia vera.  4. Thrombocytopenia secondary to Hydroxyurea.  5. Follow up in 2 months  All questions were answered. The patient knows to call the clinic with any problems, questions or concerns. We can certainly see the patient much sooner if necessary.  I spent 15  minutes counseling the patient face to face. The total time spent in the appointment was 25 minutes.    Konya Fauble, MD 09/26/2013 12:02 PM

## 2013-09-26 NOTE — Patient Instructions (Signed)
Rivaroxaban oral tablets What is this medicine? RIVAROXABAN (ri va ROX a ban) is an anticoagulant (blood thinner). It is used to treat blood clots in the lungs or in the veins. It is also used after knee or hip surgeries to prevent blood clots. It is also used to lower the chance of stroke in people with a medical condition called atrial fibrillation. This medicine may be used for other purposes; ask your health care provider or pharmacist if you have questions. COMMON BRAND NAME(S): Xarelto What should I tell my health care provider before I take this medicine? They need to know if you have any of these conditions: -bleeding disorders -bleeding in the brain -blood in your stools (black or tarry stools) or if you have blood in your vomit -history of stomach bleeding -kidney disease -liver disease -low blood counts, like low white cell, platelet, or red cell counts -recent or planned spinal or epidural procedure -take medicines that treat or prevent blood clots -an unusual or allergic reaction to rivaroxaban, other medicines, foods, dyes, or preservatives -pregnant or trying to get pregnant -breast-feeding How should I use this medicine? Take this medicine by mouth with a glass of water. Follow the directions on the prescription label. Take your medicine at regular intervals. Do not take it more often than directed. Do not stop taking except on your doctor's advice. Stopping this medicine may increase your risk of a blot clot. Be sure to refill your prescription before you run out of medicine. If you are taking this medicine after hip or knee replacement surgery, take it with or without food. If you are taking this medicine for atrial fibrillation, take it with your evening meal. If you are taking this medicine to treat blood clots, take it with food at the same time each day. If you are unable to swallow your tablet, you may crush the tablet and mix it in applesauce. Then, immediately eat the  applesauce. You should eat more food right after you eat the applesauce containing the crushed tablet. Talk to your pediatrician regarding the use of this medicine in children. Special care may be needed. Overdosage: If you think you have taken too much of this medicine contact a poison control center or emergency room at once. NOTE: This medicine is only for you. Do not share this medicine with others. What if I miss a dose? If you take your medicine once a day and miss a dose, take the missed dose as soon as you remember. If you take your medicine twice a day and miss a dose, take the missed dose immediately. In this instance, 2 tablets may be taken at the same time. The next day you should take 1 tablet twice a day as directed. What may interact with this medicine? -aspirin and aspirin-like medicines -certain antibiotics like erythromycin, azithromycin, and clarithromycin -certain medicines for fungal infections like ketoconazole and itraconazole -certain medicines for irregular heart beat like amiodarone, quinidine, dronedarone -certain medicines for seizures like carbamazepine, phenytoin -certain medicines that treat or prevent blood clots like warfarin, enoxaparin, and dalteparin  -conivaptan -diltiazem -felodipine -indinavir -lopinavir; ritonavir -NSAIDS, medicines for pain and inflammation, like ibuprofen or naproxen -ranolazine -rifampin -ritonavir -St. John's wort -verapamil This list may not describe all possible interactions. Give your health care provider a list of all the medicines, herbs, non-prescription drugs, or dietary supplements you use. Also tell them if you smoke, drink alcohol, or use illegal drugs. Some items may interact with your medicine. What should I   watch for while using this medicine? Visit your doctor or health care professional for regular checks on your progress. Your condition will be monitored carefully while you are receiving this medicine. Notify your  doctor or health care professional and seek emergency treatment if you develop breathing problems; changes in vision; chest pain; severe, sudden headache; pain, swelling, warmth in the leg; trouble speaking; sudden numbness or weakness of the face, arm, or leg. These can be signs that your condition has gotten worse. If you are going to have surgery, tell your doctor or health care professional that you are taking this medicine. Tell your health care professional that you use this medicine before you have a spinal or epidural procedure. Sometimes people who take this medicine have bleeding problems around the spine when they have a spinal or epidural procedure. This bleeding is very rare. If you have a spinal or epidural procedure while on this medicine, call your health care professional immediately if you have back pain, numbness or tingling (especially in your legs and feet), muscle weakness, paralysis, or loss of bladder or bowel control. Avoid sports and activities that might cause injury while you are using this medicine. Severe falls or injuries can cause unseen bleeding. Be careful when using sharp tools or knives. Consider using an Copy. Take special care brushing or flossing your teeth. Report any injuries, bruising, or red spots on the skin to your doctor or health care professional. What side effects may I notice from receiving this medicine? Side effects that you should report to your doctor or health care professional as soon as possible: -allergic reactions like skin rash, itching or hives, swelling of the face, lips, or tongue -back pain -redness, blistering, peeling or loosening of the skin, including inside the mouth -signs and symptoms of bleeding such as bloody or black, tarry stools; red or dark-brown urine; spitting up blood or brown material that looks like coffee grounds; red spots on the skin; unusual bruising or bleeding from the eye, gums, or nose  Side effects that  usually do not require medical attention (Report these to your doctor or health care professional if they continue or are bothersome.): -dizziness -muscle pain This list may not describe all possible side effects. Call your doctor for medical advice about side effects. You may report side effects to FDA at 1-800-FDA-1088. Where should I keep my medicine? Keep out of the reach of children. Store at room temperature between 15 and 30 degrees C (59 and 86 degrees F). Throw away any unused medicine after the expiration date. NOTE: This sheet is a summary. It may not cover all possible information. If you have questions about this medicine, talk to your doctor, pharmacist, or health care provider.  2014, [employer]. (2012-12-18 09:51:31) Polycythemia Vera  Polycythemia Vanita Ingles is a condition in which the body makes too many red blood cells and there is no known cause. The red blood cells (erythrocytes) are the cells which carry the oxygen in your blood stream to the cells of your body. Because of the increased red blood cells, the blood becomes thicker and does not circulate as well. It would be similar to your car having oil which is too thick so it cannot start and circulate as well. When the blood is too thick it often causes headaches and dizziness. It may also cause blood clots. Even though the blood clots easier, these patients bleed easier. The bleeding is caused because the blood cells which help stop bleeding (platelets)  do not function normally. It occurs in all age groups but is more common in the 73 to 67 year age range. TREATMENT  The treatment of polycythemia vera for many years has been blood removal (phlebotomy) which is similar to blood removal in a blood bank, however this blood is not used for donation. Hydroxyurea is used to supplement phlebotomy. Aspirin is commonly given to thin the blood as long as the patient does not have a problem with bleeding. Other drugs are used based  on the progression of the disease. Document Released: 03/28/2001 Document Revised: 09/25/2011 Document Reviewed: 10/02/2008 [facility] Patient Information 2014 [place], [place]. Deep Vein Thrombosis A deep vein thrombosis (DVT) is a blood clot that develops in the deep, larger veins of the leg, arm, or pelvis. These are more dangerous than clots that might form in veins near the surface of the body. A DVT can lead to complications if the clot breaks off and travels in the bloodstream to the lungs.  A DVT can damage the valves in your leg veins, so that instead of flowing upward, the blood pools in the lower leg. This is called post-thrombotic syndrome, and it can result in pain, swelling, discoloration, and sores on the leg. CAUSES Usually, several things contribute to blood clots forming. Contributing factors include:  The flow of blood slows down.  The inside of the vein is damaged in some way.  You have a condition that makes blood clot more easily. RISK FACTORS Some people are more likely than others to develop blood clots. Risk factors include:   Older age, especially over 16 years of age.  Having a family history of blood clots or if you have already had a blot clot.  Having major or lengthy surgery. This is especially true for surgery on the hip, knee, or belly (abdomen). Hip surgery is particularly high risk.  Breaking a hip or leg.  Sitting or lying still for a long time. This includes long-distance travel, paralysis, or recovery from an illness or surgery.  Having cancer or cancer treatment.  Having a long, thin tube (catheter) placed inside a vein during a medical procedure.  Being overweight (obese).  Pregnancy and childbirth.  Hormone changes make the blood clot more easily during pregnancy.  The fetus puts pressure on the veins of the pelvis.  There is a risk of injury to veins during delivery or a caesarean. The risk is highest just after childbirth.  Medicines  with the male hormone estrogen. This includes birth control pills and hormone replacement therapy.  Smoking.  Other circulation or heart problems.  SIGNS AND SYMPTOMS When a clot forms, it can either partially or totally block the blood flow in that vein. Symptoms of a DVT can include:  Swelling of the leg or arm, especially if one side is much worse.  Warmth and redness of the leg or arm, especially if one side is much worse.  Pain in an arm or leg. If the clot is in the leg, symptoms may be more noticeable or worse when standing or walking. The symptoms of a DVT that has traveled to the lungs (pulmonary embolism, PE) usually start suddenly and include:  Shortness of breath.  Coughing.  Coughing up blood or blood-tinged phlegm.  Chest pain. The chest pain is often worse with deep breaths.  Rapid heartbeat. Anyone with these symptoms should get emergency medical treatment right away. Call your local emergency services (911 in the [place] if you have these symptoms. DIAGNOSIS If  a DVT is suspected, your health care provider will take a full medical history and perform a physical exam. Tests that also may be required include:  Blood tests, including studies of the clotting properties of the blood.  Ultrasonography to see if you have clots in your legs or lungs.  X-rays to show the flow of blood when dye is injected into the veins (venography).  Studies of your lungs if you have any chest symptoms. PREVENTION  Exercise the legs regularly. Take a brisk 30-minute walk every day.  Maintain a weight that is appropriate for your height.  Avoid sitting or lying in bed for long periods of time without moving your legs.  Women, particularly those over the age of 45 years, should consider the risks and benefits of taking estrogen medicines, including birth control pills.  Do not smoke, especially if you take estrogen medicines.  Long-distance travel can increase your risk of DVT.  You should exercise your legs by walking or pumping the muscles every hour.  In-hospital prevention:  Many of the risk factors above relate to situations that exist with hospitalization, either for illness, injury, or elective surgery.  Your health care provider will assess you for the need for venous thromboembolism prophylaxis when you are admitted to the hospital. If you are having surgery, your surgeon will assess you the day of or day after surgery.  Prevention may include medical and nonmedical measures. TREATMENT Once identified, a DVT can be treated. It can also be prevented in some circumstances. Once you have had a DVT, you may be at increased risk for a DVT in the future. The most common treatment for DVT is blood thinning (anticoagulant) medicine, which reduces the blood's tendency to clot. Anticoagulants can stop new blood clots from forming and stop old ones from growing. They cannot dissolve existing clots. Your body does this by itself over time. Anticoagulants can be given by mouth, by IV access, or by injection. Your health care provider will determine the best program for you. Other medicines or treatments that may be used are:  Heparin or related medicines (low molecular weight heparin) are usually the first treatment for a blood clot. They act quickly. However, they cannot be taken orally.  Heparin can cause a fall in a component of blood that stops bleeding and forms blood clots (platelets). You will be monitored with blood tests to be sure this does not occur.  Warfarin is an anticoagulant that can be swallowed. It takes a few days to start working, so usually heparin or related medicines are used in combination. Once warfarin is working, heparin is usually stopped.  Less commonly, clot dissolving drugs (thrombolytics) are used to dissolve a DVT. They carry a high risk of bleeding, so they are used mainly in severe cases, where your life or a limb is threatened.  Very  rarely, a blood clot in the leg needs to be removed surgically.  If you are unable to take anticoagulants, your health care provider may arrange for you to have a filter placed in a main vein in your abdomen. This filter prevents clots from traveling to your lungs. HOME CARE INSTRUCTIONS  Take all medicines prescribed by your health care provider. Only take over-the-counter or prescription medicines for pain, fever, or discomfort as directed by your health care provider.  Warfarin. Most people will continue taking warfarin after hospital discharge. Your health care provider will advise you on the length of treatment (usually 3 6 months, sometimes lifelong).  Too much and too little warfarin are both dangerous. Too much warfarin increases the risk of bleeding. Too little warfarin continues to allow the risk for blood clots. While taking warfarin, you will need to have regular blood tests to measure your blood clotting time. These blood tests usually include both the prothrombin time (PT) and international normalized ratio (INR) tests. The PT and INR results allow your health care provider to adjust your dose of warfarin. The dose can change for many reasons. It is critically important that you take warfarin exactly as prescribed, and that you have your PT and INR levels drawn exactly as directed.  Many foods, especially foods high in vitamin K, can interfere with warfarin and affect the PT and INR results. Foods high in vitamin K include spinach, kale, broccoli, cabbage, collard and turnip greens, brussel sprouts, peas, cauliflower, seaweed, and parsley as well as beef and pork liver, green tea, and soybean oil. You should eat a consistent amount of foods high in vitamin K. Avoid major changes in your diet, or notify your health care provider before changing your diet. Arrange a visit with a dietitian to answer your questions.  Many medicines can interfere with warfarin and affect the PT and INR results.  You must tell your health care provider about any and all medicines you take. This includes all vitamins and supplements. Be especially cautious with aspirin and anti-inflammatory medicines. Ask your health care provider before taking these. Do not take or discontinue any prescribed or over-the-counter medicine except on the advice of your health care provider or pharmacist.  Warfarin can have side effects, primarily excessive bruising or bleeding. You will need to hold pressure over cuts for longer than usual. Your health care provider or pharmacist will discuss other potential side effects.  Alcohol can change the body's ability to handle warfarin. It is best to avoid alcoholic drinks or consume only very small amounts while taking warfarin. Notify your health care provider if you change your alcohol intake.  Notify your dentist or other health care providers before procedures.  Activity. Ask your health care provider how soon you can go back to normal activities. It is important to stay active to prevent blood clots. If you are on anticoagulant medicine, avoid contact sports.  Exercise. It is very important to exercise. This is especially important while traveling, sitting, or standing for long periods of time. Exercise your legs by walking or by pumping the muscles frequently. Take frequent walks.  Compression stockings. These are tight elastic stockings that apply pressure to the lower legs. This pressure can help keep the blood in the legs from clotting. You may need to wear compression stockings at home to help prevent a DVT.  Do not smoke. If you smoke, quit. Ask your health care provider for help with quitting smoking.  Learn as much as you can about DVT. Knowing more about the condition should help you keep it from coming back.  Wear a medical alert bracelet or carry a medical alert card. SEEK MEDICAL CARE IF:  You notice a rapid heartbeat.  You feel weaker or more tired than  usual.  You feel faint.  You notice increased bruising.  You feel your symptoms are not getting better in the time expected.  You believe you are having side effects of medicine. SEEK IMMEDIATE MEDICAL CARE IF:  You have chest pain.  You have trouble breathing.  You have new or increased swelling or pain in one leg.  You cough  up blood.  You notice blood in vomit, in a bowel movement, or in urine. MAKE SURE YOU:  Understand these instructions.  Will watch your condition.  Will get help right away if you are not doing well or get worse. Document Released: 07/03/2005 Document Revised: 04/23/2013 Document Reviewed: 03/10/2013 [facility] Patient Information 2014 [place].

## 2013-09-26 NOTE — Patient Instructions (Signed)

## 2013-09-26 NOTE — Telephone Encounter (Signed)
gave pt appt for lab and Md until May 2015

## 2013-09-29 DIAGNOSIS — Z1212 Encounter for screening for malignant neoplasm of rectum: Secondary | ICD-10-CM | POA: Diagnosis not present

## 2013-09-30 ENCOUNTER — Telehealth: Payer: Self-pay | Admitting: *Deleted

## 2013-09-30 NOTE — Telephone Encounter (Signed)
Per staff phone call and POF I have schedueld appts.  JMW  

## 2013-10-04 ENCOUNTER — Telehealth: Payer: Self-pay | Admitting: Internal Medicine

## 2013-10-04 NOTE — Telephone Encounter (Signed)
Talk to pt and gave him appt for lab phlebotomy for April and May 2015

## 2013-10-27 ENCOUNTER — Encounter: Payer: Self-pay | Admitting: *Deleted

## 2013-10-27 ENCOUNTER — Other Ambulatory Visit (HOSPITAL_BASED_OUTPATIENT_CLINIC_OR_DEPARTMENT_OTHER): Payer: Medicare Other

## 2013-10-27 DIAGNOSIS — D45 Polycythemia vera: Secondary | ICD-10-CM

## 2013-10-27 DIAGNOSIS — I82409 Acute embolism and thrombosis of unspecified deep veins of unspecified lower extremity: Secondary | ICD-10-CM

## 2013-10-27 LAB — CBC WITH DIFFERENTIAL/PLATELET
BASO%: 1.6 % (ref 0.0–2.0)
BASOS ABS: 0.3 10*3/uL — AB (ref 0.0–0.1)
EOS ABS: 0.2 10*3/uL (ref 0.0–0.5)
EOS%: 0.9 % (ref 0.0–7.0)
HCT: 41.6 % (ref 38.4–49.9)
HEMOGLOBIN: 12.4 g/dL — AB (ref 13.0–17.1)
LYMPH%: 4.5 % — AB (ref 14.0–49.0)
MCH: 25.7 pg — ABNORMAL LOW (ref 27.2–33.4)
MCHC: 29.8 g/dL — ABNORMAL LOW (ref 32.0–36.0)
MCV: 86.3 fL (ref 79.3–98.0)
MONO#: 0.9 10*3/uL (ref 0.1–0.9)
MONO%: 4.7 % (ref 0.0–14.0)
NEUT%: 88.3 % — ABNORMAL HIGH (ref 39.0–75.0)
NEUTROS ABS: 16.8 10*3/uL — AB (ref 1.5–6.5)
PLATELETS: 113 10*3/uL — AB (ref 140–400)
RBC: 4.82 10*6/uL (ref 4.20–5.82)
RDW: 15.9 % — ABNORMAL HIGH (ref 11.0–14.6)
WBC: 19.1 10*3/uL — ABNORMAL HIGH (ref 4.0–10.3)
lymph#: 0.9 10*3/uL (ref 0.9–3.3)
nRBC: 0 % (ref 0–0)

## 2013-10-27 NOTE — Progress Notes (Signed)
Hct 41.6.  Per Dr Sande Rives note, phlebotomy indicated for hct >43%.  Pt is aware that appt is cancelled today.  SLJ

## 2013-11-26 ENCOUNTER — Ambulatory Visit (HOSPITAL_BASED_OUTPATIENT_CLINIC_OR_DEPARTMENT_OTHER): Payer: Medicare Other

## 2013-11-26 ENCOUNTER — Telehealth: Payer: Self-pay | Admitting: Internal Medicine

## 2013-11-26 ENCOUNTER — Telehealth: Payer: Self-pay | Admitting: *Deleted

## 2013-11-26 ENCOUNTER — Other Ambulatory Visit (HOSPITAL_BASED_OUTPATIENT_CLINIC_OR_DEPARTMENT_OTHER): Payer: Medicare Other

## 2013-11-26 ENCOUNTER — Other Ambulatory Visit: Payer: Medicare Other

## 2013-11-26 ENCOUNTER — Ambulatory Visit (HOSPITAL_BASED_OUTPATIENT_CLINIC_OR_DEPARTMENT_OTHER): Payer: Medicare Other | Admitting: Internal Medicine

## 2013-11-26 VITALS — BP 112/56 | HR 82 | Temp 97.4°F | Resp 20 | Ht 71.0 in | Wt 197.3 lb

## 2013-11-26 DIAGNOSIS — D6959 Other secondary thrombocytopenia: Secondary | ICD-10-CM | POA: Diagnosis not present

## 2013-11-26 DIAGNOSIS — Z7901 Long term (current) use of anticoagulants: Secondary | ICD-10-CM | POA: Diagnosis not present

## 2013-11-26 DIAGNOSIS — D45 Polycythemia vera: Secondary | ICD-10-CM

## 2013-11-26 DIAGNOSIS — I82409 Acute embolism and thrombosis of unspecified deep veins of unspecified lower extremity: Secondary | ICD-10-CM

## 2013-11-26 LAB — CBC WITH DIFFERENTIAL/PLATELET
BASO%: 2.5 % — AB (ref 0.0–2.0)
Basophils Absolute: 0.5 10*3/uL — ABNORMAL HIGH (ref 0.0–0.1)
EOS%: 1 % (ref 0.0–7.0)
Eosinophils Absolute: 0.2 10*3/uL (ref 0.0–0.5)
HEMATOCRIT: 43.5 % (ref 38.4–49.9)
HGB: 12.8 g/dL — ABNORMAL LOW (ref 13.0–17.1)
LYMPH#: 1 10*3/uL (ref 0.9–3.3)
LYMPH%: 5.4 % — ABNORMAL LOW (ref 14.0–49.0)
MCH: 25 pg — AB (ref 27.2–33.4)
MCHC: 29.5 g/dL — AB (ref 32.0–36.0)
MCV: 85 fL (ref 79.3–98.0)
MONO#: 0.8 10*3/uL (ref 0.1–0.9)
MONO%: 4.3 % (ref 0.0–14.0)
NEUT#: 17 10*3/uL — ABNORMAL HIGH (ref 1.5–6.5)
NEUT%: 86.8 % — AB (ref 39.0–75.0)
Platelets: 107 10*3/uL — ABNORMAL LOW (ref 140–400)
RBC: 5.12 10*6/uL (ref 4.20–5.82)
RDW: 16.6 % — ABNORMAL HIGH (ref 11.0–14.6)
WBC: 19.6 10*3/uL — ABNORMAL HIGH (ref 4.0–10.3)

## 2013-11-26 LAB — COMPREHENSIVE METABOLIC PANEL (CC13)
ALT: 16 U/L (ref 0–55)
AST: 19 U/L (ref 5–34)
Albumin: 3.7 g/dL (ref 3.5–5.0)
Alkaline Phosphatase: 72 U/L (ref 40–150)
Anion Gap: 10 mEq/L (ref 3–11)
BILIRUBIN TOTAL: 0.61 mg/dL (ref 0.20–1.20)
BUN: 19 mg/dL (ref 7.0–26.0)
CALCIUM: 9.1 mg/dL (ref 8.4–10.4)
CO2: 24 mEq/L (ref 22–29)
CREATININE: 1.1 mg/dL (ref 0.7–1.3)
Chloride: 105 mEq/L (ref 98–109)
Glucose: 106 mg/dl (ref 70–140)
Potassium: 4.2 mEq/L (ref 3.5–5.1)
Sodium: 139 mEq/L (ref 136–145)
Total Protein: 5.9 g/dL — ABNORMAL LOW (ref 6.4–8.3)

## 2013-11-26 LAB — LACTATE DEHYDROGENASE (CC13): LDH: 304 U/L — ABNORMAL HIGH (ref 125–245)

## 2013-11-26 NOTE — Telephone Encounter (Signed)
Gave pt appt for lab, md and phlebotomy until August 2015

## 2013-11-26 NOTE — Progress Notes (Signed)
16G phlebotomy set used for left forearm, clotted, obtained 180 mL. 16G performed to LAC, 420 mL obtained without difficulty. Patient reports eating prior to phlebotomy. Nourishments provided after procedure, completed 1410-1420. Discharged at 1455, no acute distress, ambulatory. Tolerated phlebotomy well.

## 2013-11-26 NOTE — Telephone Encounter (Signed)
Per staff phone call and POF I have schedueld appts.  JMW  

## 2013-11-26 NOTE — Patient Instructions (Signed)

## 2013-11-26 NOTE — Telephone Encounter (Signed)
Gave pt appt for lab and MD  for August 2015 °

## 2013-11-26 NOTE — Progress Notes (Signed)
[facility] OFFICE PROGRESS NOTE  Tivis Ringer, MD [place], [place]. [place] [address]  DIAGNOSIS: Polycythemia vera - Plan: CBC with Differential, Comprehensive metabolic panel (Cmet) - [facility]  DVT (deep venous thrombosis) - Plan: CBC with Differential, Comprehensive metabolic panel (Cmet) - [facility]  Chief Complaint  Patient presents with  . Follow-up    CURRENT TREATMENT:  Phlebotomy and hydroxyurea 500 mg daily. Xalreto 20 mg started about mid-February 2015.   INTERVAL HISTORY: [name] [name] 80 y.o. male with a history of Polycythemia vera dating back to 1997, JAK2 mutation positive is here for follow up.  He was last seen by me on 09/26/2013.  He was diagnosed with right lower extremity DVT.  He reported a history of pain with ambulation.  He was seen by Dr. Dagmar Hait.  He is compliant to [facility] for the last 11 weeks.  He denies any recent injuries to the leg.  He denies a prior history of blood clots.  He does report that he had a bout of diverticulitis and received antibiotic about the same time of his right lower extremity swelling. His last phlebotomy was on 09/26/2013.   MEDICAL HISTORY: Past Medical History  Diagnosis Date  . Polycythemia vera(238.4)   . Gout   . HTN (hypertension)   . Depression   . BPH (benign prostatic hypertrophy)   . Dyslipidemia   . Mild aortic stenosis   . DJD (degenerative joint disease)   . Bone marrow disease     INTERIM HISTORY: has Polycythemia vera; Aneurysm of unspecified site; and DVT (deep venous thrombosis) on his problem list.    ALLERGIES:  is allergic to colchicine.  MEDICATIONS: has a current medication list which includes the following prescription(s): allopurinol, alprazolam, escitalopram, fish oil-omega-3 fatty acids, hydroxyurea, lisinopril, multivitamin, terazosin, vitamin e, and xarelto.  SURGICAL HISTORY:  Past Surgical History  Procedure Laterality Date  .  Hemorroidectomy      REVIEW OF SYSTEMS:   Constitutional: Denies fevers, chills or abnormal weight loss Eyes: Denies blurriness of vision Ears, nose, mouth, throat, and face: Denies mucositis or sore throat Respiratory: Denies cough, dyspnea or wheezes Cardiovascular: Denies palpitation, chest discomfort or lower extremity swelling Gastrointestinal:  Denies nausea, heartburn or change in bowel habits Skin: Denies abnormal skin rashes Lymphatics: Denies new lymphadenopathy or easy bruising Neurological:Denies numbness, tingling or new weaknesses Behavioral/Psych: Mood is stable, no new changes  All other systems were reviewed with the patient and are negative.  PHYSICAL EXAMINATION: ECOG PERFORMANCE STATUS: 0 - Asymptomatic  Blood pressure 112/56, pulse 82, temperature 97.4 F (36.3 C), temperature source Oral, resp. rate 20, height 5\' 11"  (1.803 m), weight 197 lb 4.8 oz (89.495 kg), SpO2 96.00%.  General: Easily ambulatory.  HEENT: Sclerae anicteric. Conjunctivae were pink. Pupils round and reactive bilaterally. Oral mucosa is moist without ulceration or thrush.  No occipital, submandibular, cervical, supraclavicular or axillar adenopathy.  Lungs: clear to auscultation without wheezes. No rales or rhonchi.  Heart: regular rate and rhythm. No gallop or rubs. 2/6 systolic murmur.  Abdomen: soft, non tender. No guarding or rebound tenderness. Bowel sounds are present. No palpable hepatosplenomegaly.  MSK: no focal spinal tenderness.  Extremities: No clubbing or cyanosis.No calf tenderness to palpitation, no peripheral edema. The patient had grossly intact strength in upper and lower extremities. R calf measured 15 and 1/4 inches; L calf measured 15 and 1/2 inches.  Skin exam was without ecchymosis, petechiae.  Neuro: non-focal, alert and oriented to  time, person and place, appropriate affect   Labs:  Lab Results  Component Value Date   WBC 19.6* 11/26/2013   HGB 12.8* 11/26/2013    HCT 43.5 11/26/2013   MCV 85.0 11/26/2013   PLT 107* 11/26/2013   NEUTROABS 17.0* 11/26/2013      Chemistry      Component Value Date/Time   NA 139 11/26/2013 1222   NA 139 01/05/2012 1040   K 4.2 11/26/2013 1222   K 4.3 01/05/2012 1040   CL 105 09/10/2012 1133   CL 105 01/05/2012 1040   CO2 24 11/26/2013 1222   CO2 25 01/05/2012 1040   BUN 19.0 11/26/2013 1222   BUN 24* 01/05/2012 1040   CREATININE 1.1 11/26/2013 1222   CREATININE 1.16 01/05/2012 1040      Component Value Date/Time   CALCIUM 9.1 11/26/2013 1222   CALCIUM 9.1 01/05/2012 1040   ALKPHOS 72 11/26/2013 1222   ALKPHOS 64 01/05/2012 1040   AST 19 11/26/2013 1222   AST 20 01/05/2012 1040   ALT 16 11/26/2013 1222   ALT 17 01/05/2012 1040   BILITOT 0.61 11/26/2013 1222   BILITOT 0.5 01/05/2012 1040       Basic Metabolic Panel:  Recent Labs Lab 11/26/13 1222  NA 139  K 4.2  CO2 24  GLUCOSE 106  BUN 19.0  CREATININE 1.1  CALCIUM 9.1   GFR Estimated Creatinine Clearance: 50.4 ml/min (by C-G formula based on Cr of 1.1). Liver Function Tests:  Recent Labs Lab 11/26/13 1222  AST 19  ALT 16  ALKPHOS 72  BILITOT 0.61  PROT 5.9*  ALBUMIN 3.7    CBC:  Recent Labs Lab 11/26/13 1222  WBC 19.6*  NEUTROABS 17.0*  HGB 12.8*  HCT 43.5  MCV 85.0  PLT 107*    Studies:  No results found.   RADIOGRAPHIC STUDIES: No results found.  ASSESSMENT: [name] [name] 80 y.o. male with a history of Polycythemia vera - Plan: CBC with Differential, Comprehensive metabolic panel (Cmet) - [facility]  DVT (deep venous thrombosis) - Plan: CBC with Differential, Comprehensive metabolic panel (Cmet) - [facility]   PLAN:  1. Polycythemia vera dating back to 1997, JAK2 mutation positive.  - Continue aspirin.  - Continue Hydroxyurea 500 mg p.o. daily.  - CBC monthly  - Continue phlebotomy hct greater than 43. He will require a phlebotomy today.   2. RLE DVT likely secondary to #1.  --Continue Xalreto 20 mg daily. Plan to treat for six  months.  3. Neutrophilia secondary to polycythemia vera.  4. Thrombocytopenia secondary to Hydroxyurea.  5. Follow up in 3 months to consider discontinuation.   All questions were answered. The patient knows to call the clinic with any problems, questions or concerns. We can certainly see the patient much sooner if necessary.  I spent 15 minutes counseling the patient face to face. The total time spent in the appointment was 25 minutes.    Concha Norway, MD 11/26/2013 1:52 PM

## 2013-11-26 NOTE — Patient Instructions (Signed)
Rivaroxaban oral tablets °What is this medicine? °RIVAROXABAN (ri va ROX a ban) is an anticoagulant (blood thinner). It is used to treat blood clots in the lungs or in the veins. It is also used after knee or hip surgeries to prevent blood clots. It is also used to lower the chance of stroke in people with a medical condition called atrial fibrillation. °This medicine may be used for other purposes; ask your health care provider or pharmacist if you have questions. °COMMON BRAND NAME(S): Xarelto °What should I tell my health care provider before I take this medicine? °They need to know if you have any of these conditions: °-bleeding disorders °-bleeding in the brain °-blood in your stools (black or tarry stools) or if you have blood in your vomit °-history of stomach bleeding °-kidney disease °-liver disease °-low blood counts, like low white cell, platelet, or red cell counts °-recent or planned spinal or epidural procedure °-take medicines that treat or prevent blood clots °-an unusual or allergic reaction to rivaroxaban, other medicines, foods, dyes, or preservatives °-pregnant or trying to get pregnant °-breast-feeding °How should I use this medicine? °Take this medicine by mouth with a glass of water. Follow the directions on the prescription label. Take your medicine at regular intervals. Do not take it more often than directed. Do not stop taking except on your doctor's advice. Stopping this medicine may increase your risk of a blot clot. Be sure to refill your prescription before you run out of medicine. °If you are taking this medicine after hip or knee replacement surgery, take it with or without food. If you are taking this medicine for atrial fibrillation, take it with your evening meal. If you are taking this medicine to treat blood clots, take it with food at the same time each day. If you are unable to swallow your tablet, you may crush the tablet and mix it in applesauce. Then, immediately eat the  applesauce. You should eat more food right after you eat the applesauce containing the crushed tablet. °Talk to your pediatrician regarding the use of this medicine in children. Special care may be needed. °Overdosage: If you think you have taken too much of this medicine contact a poison control center or emergency room at once. °NOTE: This medicine is only for you. Do not share this medicine with others. °What if I miss a dose? °If you take your medicine once a day and miss a dose, take the missed dose as soon as you remember. If you take your medicine twice a day and miss a dose, take the missed dose immediately. In this instance, 2 tablets may be taken at the same time. The next day you should take 1 tablet twice a day as directed. °What may interact with this medicine? °-aspirin and aspirin-like medicines °-certain antibiotics like erythromycin, azithromycin, and clarithromycin °-certain medicines for fungal infections like ketoconazole and itraconazole °-certain medicines for irregular heart beat like amiodarone, quinidine, dronedarone °-certain medicines for seizures like carbamazepine, phenytoin °-certain medicines that treat or prevent blood clots like warfarin, enoxaparin, and dalteparin  °-conivaptan °-diltiazem °-felodipine °-indinavir °-lopinavir; ritonavir °-NSAIDS, medicines for pain and inflammation, like ibuprofen or naproxen °-ranolazine °-rifampin °-ritonavir °-St. John's wort °-verapamil °This list may not describe all possible interactions. Give your health care provider a list of all the medicines, herbs, non-prescription drugs, or dietary supplements you use. Also tell them if you smoke, drink alcohol, or use illegal drugs. Some items may interact with your medicine. °What should I   watch for while using this medicine? °Visit your doctor or health care professional for regular checks on your progress. Your condition will be monitored carefully while you are receiving this medicine. °Notify your  doctor or health care professional and seek emergency treatment if you develop breathing problems; changes in vision; chest pain; severe, sudden headache; pain, swelling, warmth in the leg; trouble speaking; sudden numbness or weakness of the face, arm, or leg. These can be signs that your condition has gotten worse. °If you are going to have surgery, tell your doctor or health care professional that you are taking this medicine. °Tell your health care professional that you use this medicine before you have a spinal or epidural procedure. Sometimes people who take this medicine have bleeding problems around the spine when they have a spinal or epidural procedure. This bleeding is very rare. If you have a spinal or epidural procedure while on this medicine, call your health care professional immediately if you have back pain, numbness or tingling (especially in your legs and feet), muscle weakness, paralysis, or loss of bladder or bowel control. °Avoid sports and activities that might cause injury while you are using this medicine. Severe falls or injuries can cause unseen bleeding. Be careful when using sharp tools or knives. Consider using an electric razor. Take special care brushing or flossing your teeth. Report any injuries, bruising, or red spots on the skin to your doctor or health care professional. °What side effects may I notice from receiving this medicine? °Side effects that you should report to your doctor or health care professional as soon as possible: °-allergic reactions like skin rash, itching or hives, swelling of the face, lips, or tongue °-back pain °-redness, blistering, peeling or loosening of the skin, including inside the mouth °-signs and symptoms of bleeding such as bloody or black, tarry stools; red or dark-brown urine; spitting up blood or brown material that looks like coffee grounds; red spots on the skin; unusual bruising or bleeding from the eye, gums, or nose  °Side effects that  usually do not require medical attention (Report these to your doctor or health care professional if they continue or are bothersome.): °-dizziness °-muscle pain °This list may not describe all possible side effects. Call your doctor for medical advice about side effects. You may report side effects to FDA at 1-800-FDA-1088. °Where should I keep my medicine? °Keep out of the reach of children. °Store at room temperature between 15 and 30 degrees C (59 and 86 degrees F). Throw away any unused medicine after the expiration date. °NOTE: This sheet is a summary. It may not cover all possible information. If you have questions about this medicine, talk to your doctor, pharmacist, or health care provider. °© 2014, [employer]. (2012-12-18 09:51:31) ° °

## 2013-12-18 ENCOUNTER — Telehealth: Payer: Self-pay | Admitting: Internal Medicine

## 2013-12-18 NOTE — Telephone Encounter (Signed)
pt could not makd 6.10 appt requested to move to 6.11 done...pt aware of new d.t

## 2013-12-24 ENCOUNTER — Other Ambulatory Visit: Payer: Medicare Other

## 2013-12-25 ENCOUNTER — Ambulatory Visit: Payer: Medicare Other

## 2013-12-25 ENCOUNTER — Other Ambulatory Visit: Payer: Self-pay | Admitting: Medical Oncology

## 2013-12-25 ENCOUNTER — Other Ambulatory Visit (HOSPITAL_BASED_OUTPATIENT_CLINIC_OR_DEPARTMENT_OTHER): Payer: Medicare Other

## 2013-12-25 DIAGNOSIS — D45 Polycythemia vera: Secondary | ICD-10-CM

## 2013-12-25 LAB — CBC WITH DIFFERENTIAL/PLATELET
BASO%: 2 % (ref 0.0–2.0)
BASOS ABS: 0.4 10*3/uL — AB (ref 0.0–0.1)
EOS ABS: 0.2 10*3/uL (ref 0.0–0.5)
EOS%: 1.3 % (ref 0.0–7.0)
HCT: 42 % (ref 38.4–49.9)
HEMOGLOBIN: 12.4 g/dL — AB (ref 13.0–17.1)
LYMPH%: 6.6 % — AB (ref 14.0–49.0)
MCH: 25.6 pg — ABNORMAL LOW (ref 27.2–33.4)
MCHC: 29.5 g/dL — ABNORMAL LOW (ref 32.0–36.0)
MCV: 86.6 fL (ref 79.3–98.0)
MONO#: 0.8 10*3/uL (ref 0.1–0.9)
MONO%: 4.4 % (ref 0.0–14.0)
NEUT%: 85.7 % — ABNORMAL HIGH (ref 39.0–75.0)
NEUTROS ABS: 14.7 10*3/uL — AB (ref 1.5–6.5)
Platelets: 95 10*3/uL — ABNORMAL LOW (ref 140–400)
RBC: 4.85 10*6/uL (ref 4.20–5.82)
RDW: 16 % — ABNORMAL HIGH (ref 11.0–14.6)
WBC: 17.2 10*3/uL — ABNORMAL HIGH (ref 4.0–10.3)
lymph#: 1.1 10*3/uL (ref 0.9–3.3)
nRBC: 0 % (ref 0–0)

## 2013-12-25 NOTE — Progress Notes (Signed)
HCT 42.0 today. No phlebotomy indicated per office note. Gave patient July schedule and copy of current labs. Patient denied any problems and verbalized understanding.

## 2013-12-30 ENCOUNTER — Telehealth: Payer: Self-pay | Admitting: Medical Oncology

## 2013-12-30 NOTE — Telephone Encounter (Signed)
Pt called asking if he needed to follow up with Dr. Juliann Mule before he leaves. He is taking the xarelto for the DVT. Per Dr. Juliann Mule it is ok to wait until his follow up in August. They had discussed stopping the xarelto after 6 months. He voiced understanding and will call if any problems or concerns.

## 2014-01-20 ENCOUNTER — Other Ambulatory Visit: Payer: Self-pay

## 2014-01-20 DIAGNOSIS — D45 Polycythemia vera: Secondary | ICD-10-CM

## 2014-01-21 ENCOUNTER — Ambulatory Visit (HOSPITAL_BASED_OUTPATIENT_CLINIC_OR_DEPARTMENT_OTHER): Payer: Medicare Other

## 2014-01-21 ENCOUNTER — Other Ambulatory Visit (HOSPITAL_BASED_OUTPATIENT_CLINIC_OR_DEPARTMENT_OTHER): Payer: Medicare Other

## 2014-01-21 DIAGNOSIS — D45 Polycythemia vera: Secondary | ICD-10-CM | POA: Diagnosis not present

## 2014-01-21 LAB — CBC WITH DIFFERENTIAL/PLATELET
BASO%: 2 % (ref 0.0–2.0)
Basophils Absolute: 0.4 10*3/uL — ABNORMAL HIGH (ref 0.0–0.1)
EOS%: 1 % (ref 0.0–7.0)
Eosinophils Absolute: 0.2 10*3/uL (ref 0.0–0.5)
HEMATOCRIT: 44.2 % (ref 38.4–49.9)
HGB: 13.1 g/dL (ref 13.0–17.1)
LYMPH%: 7 % — ABNORMAL LOW (ref 14.0–49.0)
MCH: 25.5 pg — AB (ref 27.2–33.4)
MCHC: 29.6 g/dL — AB (ref 32.0–36.0)
MCV: 86.2 fL (ref 79.3–98.0)
MONO#: 1.1 10*3/uL — ABNORMAL HIGH (ref 0.1–0.9)
MONO%: 5.9 % (ref 0.0–14.0)
NEUT#: 15 10*3/uL — ABNORMAL HIGH (ref 1.5–6.5)
NEUT%: 84.1 % — AB (ref 39.0–75.0)
PLATELETS: 100 10*3/uL — AB (ref 140–400)
RBC: 5.13 10*6/uL (ref 4.20–5.82)
RDW: 15.6 % — ABNORMAL HIGH (ref 11.0–14.6)
WBC: 17.9 10*3/uL — ABNORMAL HIGH (ref 4.0–10.3)
lymph#: 1.3 10*3/uL (ref 0.9–3.3)
nRBC: 0 % (ref 0–0)

## 2014-01-21 NOTE — Patient Instructions (Signed)

## 2014-01-21 NOTE — Progress Notes (Signed)
Phlebotomy done on patient. 1 unit removed. Snack and drinks provided before and after phlebotomy procedure.

## 2014-01-22 NOTE — Telephone Encounter (Signed)
1 

## 2014-02-10 DIAGNOSIS — J029 Acute pharyngitis, unspecified: Secondary | ICD-10-CM | POA: Diagnosis not present

## 2014-02-26 ENCOUNTER — Ambulatory Visit (HOSPITAL_BASED_OUTPATIENT_CLINIC_OR_DEPARTMENT_OTHER): Payer: Medicare Other

## 2014-02-26 ENCOUNTER — Other Ambulatory Visit (HOSPITAL_BASED_OUTPATIENT_CLINIC_OR_DEPARTMENT_OTHER): Payer: Medicare Other

## 2014-02-26 ENCOUNTER — Ambulatory Visit (HOSPITAL_BASED_OUTPATIENT_CLINIC_OR_DEPARTMENT_OTHER): Payer: Medicare Other | Admitting: Internal Medicine

## 2014-02-26 ENCOUNTER — Encounter: Payer: Self-pay | Admitting: Internal Medicine

## 2014-02-26 ENCOUNTER — Telehealth: Payer: Self-pay | Admitting: Internal Medicine

## 2014-02-26 VITALS — BP 115/54 | HR 92 | Temp 98.3°F | Resp 18 | Ht 71.0 in | Wt 198.1 lb

## 2014-02-26 VITALS — BP 143/69 | HR 68

## 2014-02-26 DIAGNOSIS — D6959 Other secondary thrombocytopenia: Secondary | ICD-10-CM

## 2014-02-26 DIAGNOSIS — Z7901 Long term (current) use of anticoagulants: Secondary | ICD-10-CM | POA: Diagnosis not present

## 2014-02-26 DIAGNOSIS — D45 Polycythemia vera: Secondary | ICD-10-CM | POA: Diagnosis not present

## 2014-02-26 DIAGNOSIS — I82401 Acute embolism and thrombosis of unspecified deep veins of right lower extremity: Secondary | ICD-10-CM

## 2014-02-26 DIAGNOSIS — I82409 Acute embolism and thrombosis of unspecified deep veins of unspecified lower extremity: Secondary | ICD-10-CM

## 2014-02-26 LAB — CBC WITH DIFFERENTIAL/PLATELET
BASO%: 2.1 % — ABNORMAL HIGH (ref 0.0–2.0)
BASOS ABS: 0.4 10*3/uL — AB (ref 0.0–0.1)
EOS%: 0.8 % (ref 0.0–7.0)
Eosinophils Absolute: 0.1 10*3/uL (ref 0.0–0.5)
HCT: 43.7 % (ref 38.4–49.9)
HGB: 12.9 g/dL — ABNORMAL LOW (ref 13.0–17.1)
LYMPH%: 5.4 % — AB (ref 14.0–49.0)
MCH: 25 pg — AB (ref 27.2–33.4)
MCHC: 29.5 g/dL — ABNORMAL LOW (ref 32.0–36.0)
MCV: 84.7 fL (ref 79.3–98.0)
MONO#: 0.6 10*3/uL (ref 0.1–0.9)
MONO%: 3.4 % (ref 0.0–14.0)
NEUT#: 14.9 10*3/uL — ABNORMAL HIGH (ref 1.5–6.5)
NEUT%: 88.3 % — AB (ref 39.0–75.0)
Platelets: 128 10*3/uL — ABNORMAL LOW (ref 140–400)
RBC: 5.16 10*6/uL (ref 4.20–5.82)
RDW: 15.9 % — ABNORMAL HIGH (ref 11.0–14.6)
WBC: 16.9 10*3/uL — ABNORMAL HIGH (ref 4.0–10.3)
lymph#: 0.9 10*3/uL (ref 0.9–3.3)

## 2014-02-26 LAB — COMPREHENSIVE METABOLIC PANEL (CC13)
ALK PHOS: 62 U/L (ref 40–150)
ALT: 12 U/L (ref 0–55)
AST: 18 U/L (ref 5–34)
Albumin: 3.7 g/dL (ref 3.5–5.0)
Anion Gap: 9 mEq/L (ref 3–11)
BILIRUBIN TOTAL: 0.54 mg/dL (ref 0.20–1.20)
BUN: 19 mg/dL (ref 7.0–26.0)
CO2: 27 mEq/L (ref 22–29)
CREATININE: 1.3 mg/dL (ref 0.7–1.3)
Calcium: 9.1 mg/dL (ref 8.4–10.4)
Chloride: 104 mEq/L (ref 98–109)
Glucose: 117 mg/dl (ref 70–140)
Potassium: 4.3 mEq/L (ref 3.5–5.1)
Sodium: 140 mEq/L (ref 136–145)
Total Protein: 6 g/dL — ABNORMAL LOW (ref 6.4–8.3)

## 2014-02-26 NOTE — Telephone Encounter (Signed)
, °

## 2014-02-26 NOTE — Progress Notes (Signed)
First IV start clotted after 252mls removed.  Needle removed.  Second IV started with 18G Cath and 225mls removed.    Patient observed for 30 minutes after procedure without any incident. Patient tolerated procedure well. IV needle removed intact.

## 2014-02-26 NOTE — Patient Instructions (Signed)

## 2014-02-26 NOTE — Progress Notes (Signed)
[facility] OFFICE PROGRESS NOTE  Tivis Ringer, MD [place], [place]. [place] [address] [place] [address]  DIAGNOSIS: Polycythemia vera - Plan: CBC with Differential, Basic metabolic panel (Bmet) - [facility], Lactate dehydrogenase (LDH) - [facility]  DVT (deep venous thrombosis), right - Plan: CBC with Differential, Basic metabolic panel (Bmet) - [facility], Lactate dehydrogenase (LDH) - [facility]  Chief Complaint  Patient presents with  . Follow-up    CURRENT TREATMENT:  Phlebotomy and hydroxyurea 500 mg daily. Xalreto 20 mg started about mid-February 2015.   INTERVAL HISTORY: [name] [name] 80 y.o. male with a history of Polycythemia vera dating back to 1997, JAK2 mutation positive is here for follow up.  He was last seen by me on 11/26/2013.   He is compliant to [facility] for the last 6 months.   His last phlebotomy was on 01/21/2014, 11/26/2013 and 09/26/2013.   MEDICAL HISTORY: Past Medical History  Diagnosis Date  . Polycythemia vera(238.4)   . Gout   . HTN (hypertension)   . Depression   . BPH (benign prostatic hypertrophy)   . Dyslipidemia   . Mild aortic stenosis   . DJD (degenerative joint disease)   . Bone marrow disease     INTERIM HISTORY: has Polycythemia vera; Aneurysm of unspecified site; and DVT (deep venous thrombosis) on his problem list.    ALLERGIES:  is allergic to colchicine.  MEDICATIONS: has a current medication list which includes the following prescription(s): allopurinol, alprazolam, escitalopram, fish oil-omega-3 fatty acids, hydroxyurea, lisinopril, multivitamin, terazosin, vitamin e, and xarelto.  SURGICAL HISTORY:  Past Surgical History  Procedure Laterality Date  . Hemorroidectomy      REVIEW OF SYSTEMS:   Constitutional: Denies fevers, chills or abnormal weight loss Eyes: Denies blurriness of vision Ears, nose, mouth, throat, and face: Denies mucositis or sore throat Respiratory: Denies cough, dyspnea or  wheezes Cardiovascular: Denies palpitation, chest discomfort or lower extremity swelling Gastrointestinal:  Denies nausea, heartburn or change in bowel habits Skin: Denies abnormal skin rashes Lymphatics: Denies new lymphadenopathy or easy bruising Neurological:Denies numbness, tingling or new weaknesses Behavioral/Psych: Mood is stable, no new changes  All other systems were reviewed with the patient and are negative.  PHYSICAL EXAMINATION: ECOG PERFORMANCE STATUS: 0 - Asymptomatic  Blood pressure 115/54, pulse 92, temperature 98.3 F (36.8 C), temperature source Oral, resp. rate 18, height 5\' 11"  (1.803 m), weight 198 lb 1.6 oz (89.858 kg).  General: Easily ambulatory.  HEENT: Sclerae anicteric. Conjunctivae were pink. Pupils round and reactive bilaterally. Oral mucosa is moist without ulceration or thrush.  No occipital, submandibular, cervical, supraclavicular or axillar adenopathy.  Lungs: clear to auscultation without wheezes. No rales or rhonchi.  Heart: regular rate and rhythm. No gallop or rubs. 2/6 systolic murmur.  Abdomen: soft, non tender. No guarding or rebound tenderness. Bowel sounds are present. No palpable hepatosplenomegaly.  MSK: no focal spinal tenderness.  Extremities: No clubbing or cyanosis.No calf tenderness to palpitation, no peripheral edema. The patient had grossly intact strength in upper and lower extremities.  Skin exam was without ecchymosis, petechiae.  Neuro: non-focal, alert and oriented to time, person and place, appropriate affect   Labs:  Lab Results  Component Value Date   WBC 16.9* 02/26/2014   HGB 12.9* 02/26/2014   HCT 43.7 02/26/2014   MCV 84.7 02/26/2014   PLT 128* 02/26/2014   NEUTROABS 14.9* 02/26/2014      Chemistry      Component Value Date/Time   NA 140 02/26/2014 1237  NA 139 01/05/2012 1040   K 4.3 02/26/2014 1237   K 4.3 01/05/2012 1040   CL 105 09/10/2012 1133   CL 105 01/05/2012 1040   CO2 27 02/26/2014 1237   CO2 25 01/05/2012  1040   BUN 19.0 02/26/2014 1237   BUN 24* 01/05/2012 1040   CREATININE 1.3 02/26/2014 1237   CREATININE 1.16 01/05/2012 1040      Component Value Date/Time   CALCIUM 9.1 02/26/2014 1237   CALCIUM 9.1 01/05/2012 1040   ALKPHOS 62 02/26/2014 1237   ALKPHOS 64 01/05/2012 1040   AST 18 02/26/2014 1237   AST 20 01/05/2012 1040   ALT 12 02/26/2014 1237   ALT 17 01/05/2012 1040   BILITOT 0.54 02/26/2014 1237   BILITOT 0.5 01/05/2012 1040       Basic Metabolic Panel:  Recent Labs Lab 02/26/14 1237  NA 140  K 4.3  CO2 27  GLUCOSE 117  BUN 19.0  CREATININE 1.3  CALCIUM 9.1   GFR Estimated Creatinine Clearance: 42.6 ml/min (by C-G formula based on Cr of 1.3). Liver Function Tests:  Recent Labs Lab 02/26/14 1237  AST 18  ALT 12  ALKPHOS 62  BILITOT 0.54  PROT 6.0*  ALBUMIN 3.7    CBC:  Recent Labs Lab 02/26/14 1237  WBC 16.9*  NEUTROABS 14.9*  HGB 12.9*  HCT 43.7  MCV 84.7  PLT 128*    Studies:  No results found.   RADIOGRAPHIC STUDIES: No results found.  ASSESSMENT: [name] [name] 80 y.o. male with a history of Polycythemia vera - Plan: CBC with Differential, Basic metabolic panel (Bmet) - [facility], Lactate dehydrogenase (LDH) - [facility]  DVT (deep venous thrombosis), right - Plan: CBC with Differential, Basic metabolic panel (Bmet) - [facility], Lactate dehydrogenase (LDH) - [facility]   PLAN:  1. Polycythemia vera dating back to 1997, JAK2 mutation positive.  - Continue aspirin.  - Continue Hydroxyurea 500 mg p.o. daily.  - CBC monthly  - Continue phlebotomy hct greater than 43. He will require a phlebotomy today.   2. RLE DVT likely secondary to #1.  --Stop Xalreto 20 mg daily.Completed treatment for six months. Counseled to call with any symptoms of lower extremity pain or swelling or shortness of breath.  Risks associated with bleeding secondary to diverticular disease, his age and falls outweigh potential benefit of clot reduction.  If more clots, he may require  anticoagulation indefinitely.  Patient was agreeable with this plan.  3. Neutrophilia secondary to polycythemia vera.  4. Thrombocytopenia secondary to Hydroxyurea.  5. Follow up in 3 months.   All questions were answered. The patient knows to call the clinic with any problems, questions or concerns. We can certainly see the patient much sooner if necessary.  I spent 15 minutes counseling the patient face to face. The total time spent in the appointment was 25 minutes.    Jamice Carreno, MD 02/26/2014 3:48 PM

## 2014-03-26 ENCOUNTER — Other Ambulatory Visit (HOSPITAL_BASED_OUTPATIENT_CLINIC_OR_DEPARTMENT_OTHER): Payer: Medicare Other

## 2014-03-26 ENCOUNTER — Ambulatory Visit: Payer: Medicare Other

## 2014-03-26 DIAGNOSIS — D45 Polycythemia vera: Secondary | ICD-10-CM

## 2014-03-26 LAB — CBC WITH DIFFERENTIAL/PLATELET
BASO%: 1.8 % (ref 0.0–2.0)
Basophils Absolute: 0.4 10*3/uL — ABNORMAL HIGH (ref 0.0–0.1)
EOS ABS: 0.2 10*3/uL (ref 0.0–0.5)
EOS%: 1 % (ref 0.0–7.0)
HEMATOCRIT: 42.3 % (ref 38.4–49.9)
HGB: 12.6 g/dL — ABNORMAL LOW (ref 13.0–17.1)
LYMPH%: 7.3 % — AB (ref 14.0–49.0)
MCH: 25.9 pg — ABNORMAL LOW (ref 27.2–33.4)
MCHC: 29.8 g/dL — ABNORMAL LOW (ref 32.0–36.0)
MCV: 86.9 fL (ref 79.3–98.0)
MONO#: 1.1 10*3/uL — ABNORMAL HIGH (ref 0.1–0.9)
MONO%: 5.8 % (ref 0.0–14.0)
NEUT%: 84.1 % — ABNORMAL HIGH (ref 39.0–75.0)
NEUTROS ABS: 16 10*3/uL — AB (ref 1.5–6.5)
PLATELETS: 130 10*3/uL — AB (ref 140–400)
RBC: 4.87 10*6/uL (ref 4.20–5.82)
RDW: 15.9 % — ABNORMAL HIGH (ref 11.0–14.6)
WBC: 19.1 10*3/uL — ABNORMAL HIGH (ref 4.0–10.3)
lymph#: 1.4 10*3/uL (ref 0.9–3.3)

## 2014-03-26 NOTE — Progress Notes (Signed)
Pt's Hct is 42.3. Progress note stated that patient is  To  Be at level of 43 or higher for phlebotomy. Pt given copy of labs and  Told to keep to keep scheduled appointments. Pt verbalized instructions.

## 2014-03-30 DIAGNOSIS — I10 Essential (primary) hypertension: Secondary | ICD-10-CM | POA: Diagnosis not present

## 2014-03-30 DIAGNOSIS — F3289 Other specified depressive episodes: Secondary | ICD-10-CM | POA: Diagnosis not present

## 2014-03-30 DIAGNOSIS — I82409 Acute embolism and thrombosis of unspecified deep veins of unspecified lower extremity: Secondary | ICD-10-CM | POA: Diagnosis not present

## 2014-03-30 DIAGNOSIS — D751 Secondary polycythemia: Secondary | ICD-10-CM | POA: Diagnosis not present

## 2014-03-30 DIAGNOSIS — F329 Major depressive disorder, single episode, unspecified: Secondary | ICD-10-CM | POA: Diagnosis not present

## 2014-03-30 DIAGNOSIS — R7301 Impaired fasting glucose: Secondary | ICD-10-CM | POA: Diagnosis not present

## 2014-04-22 ENCOUNTER — Other Ambulatory Visit: Payer: Self-pay | Admitting: *Deleted

## 2014-04-22 DIAGNOSIS — D45 Polycythemia vera: Secondary | ICD-10-CM

## 2014-04-23 ENCOUNTER — Other Ambulatory Visit (HOSPITAL_BASED_OUTPATIENT_CLINIC_OR_DEPARTMENT_OTHER): Payer: Medicare Other

## 2014-04-23 ENCOUNTER — Ambulatory Visit: Payer: Medicare Other

## 2014-04-23 DIAGNOSIS — D45 Polycythemia vera: Secondary | ICD-10-CM | POA: Diagnosis not present

## 2014-04-23 LAB — CBC WITH DIFFERENTIAL/PLATELET
BASO%: 2.9 % — AB (ref 0.0–2.0)
Basophils Absolute: 0.5 10*3/uL — ABNORMAL HIGH (ref 0.0–0.1)
EOS ABS: 0.1 10*3/uL (ref 0.0–0.5)
EOS%: 0.7 % (ref 0.0–7.0)
HEMATOCRIT: 42.6 % (ref 38.4–49.9)
HGB: 12.5 g/dL — ABNORMAL LOW (ref 13.0–17.1)
LYMPH%: 5.6 % — AB (ref 14.0–49.0)
MCH: 25 pg — ABNORMAL LOW (ref 27.2–33.4)
MCHC: 29.4 g/dL — AB (ref 32.0–36.0)
MCV: 84.8 fL (ref 79.3–98.0)
MONO#: 0.8 10*3/uL (ref 0.1–0.9)
MONO%: 4.3 % (ref 0.0–14.0)
NEUT#: 15.7 10*3/uL — ABNORMAL HIGH (ref 1.5–6.5)
NEUT%: 86.5 % — AB (ref 39.0–75.0)
PLATELETS: 130 10*3/uL — AB (ref 140–400)
RBC: 5.02 10*6/uL (ref 4.20–5.82)
RDW: 16.6 % — ABNORMAL HIGH (ref 11.0–14.6)
WBC: 18.1 10*3/uL — ABNORMAL HIGH (ref 4.0–10.3)
lymph#: 1 10*3/uL (ref 0.9–3.3)

## 2014-04-23 LAB — COMPREHENSIVE METABOLIC PANEL (CC13)
ALK PHOS: 72 U/L (ref 40–150)
ALT: 18 U/L (ref 0–55)
AST: 22 U/L (ref 5–34)
Albumin: 3.6 g/dL (ref 3.5–5.0)
Anion Gap: 6 mEq/L (ref 3–11)
BILIRUBIN TOTAL: 0.6 mg/dL (ref 0.20–1.20)
BUN: 21.8 mg/dL (ref 7.0–26.0)
CO2: 27 meq/L (ref 22–29)
CREATININE: 1.2 mg/dL (ref 0.7–1.3)
Calcium: 9 mg/dL (ref 8.4–10.4)
Chloride: 107 mEq/L (ref 98–109)
GLUCOSE: 107 mg/dL (ref 70–140)
Potassium: 4.2 mEq/L (ref 3.5–5.1)
SODIUM: 141 meq/L (ref 136–145)
TOTAL PROTEIN: 6.1 g/dL — AB (ref 6.4–8.3)

## 2014-04-23 NOTE — Progress Notes (Signed)
Spoke to patient in infusion room.  HCT today 42.6 .  Per office note, phlebotomy to be done if HCT greater than 43.  No phlebotomy indicated today.  Patient given copy of labs, pt verbalized understanding.

## 2014-05-20 ENCOUNTER — Other Ambulatory Visit: Payer: Self-pay | Admitting: *Deleted

## 2014-05-20 DIAGNOSIS — D45 Polycythemia vera: Secondary | ICD-10-CM

## 2014-05-21 ENCOUNTER — Ambulatory Visit (HOSPITAL_BASED_OUTPATIENT_CLINIC_OR_DEPARTMENT_OTHER): Payer: Medicare Other

## 2014-05-21 ENCOUNTER — Other Ambulatory Visit (HOSPITAL_BASED_OUTPATIENT_CLINIC_OR_DEPARTMENT_OTHER): Payer: Medicare Other

## 2014-05-21 DIAGNOSIS — D45 Polycythemia vera: Secondary | ICD-10-CM

## 2014-05-21 LAB — CBC WITH DIFFERENTIAL/PLATELET
BASO%: 1.7 % (ref 0.0–2.0)
BASOS ABS: 0.3 10*3/uL — AB (ref 0.0–0.1)
EOS%: 1.1 % (ref 0.0–7.0)
Eosinophils Absolute: 0.2 10*3/uL (ref 0.0–0.5)
HEMATOCRIT: 44.4 % (ref 38.4–49.9)
HEMOGLOBIN: 13.3 g/dL (ref 13.0–17.1)
LYMPH%: 6.4 % — AB (ref 14.0–49.0)
MCH: 26 pg — ABNORMAL LOW (ref 27.2–33.4)
MCHC: 30 g/dL — ABNORMAL LOW (ref 32.0–36.0)
MCV: 86.9 fL (ref 79.3–98.0)
MONO#: 0.7 10*3/uL (ref 0.1–0.9)
MONO%: 4 % (ref 0.0–14.0)
NEUT#: 14 10*3/uL — ABNORMAL HIGH (ref 1.5–6.5)
NEUT%: 86.8 % — AB (ref 39.0–75.0)
PLATELETS: 97 10*3/uL — AB (ref 140–400)
RBC: 5.11 10*6/uL (ref 4.20–5.82)
RDW: 16 % — ABNORMAL HIGH (ref 11.0–14.6)
WBC: 16.1 10*3/uL — ABNORMAL HIGH (ref 4.0–10.3)
lymph#: 1 10*3/uL (ref 0.9–3.3)
nRBC: 0 % (ref 0–0)

## 2014-05-21 LAB — COMPREHENSIVE METABOLIC PANEL (CC13)
ALBUMIN: 3.7 g/dL (ref 3.5–5.0)
ALT: 19 U/L (ref 0–55)
AST: 20 U/L (ref 5–34)
Alkaline Phosphatase: 72 U/L (ref 40–150)
Anion Gap: 7 mEq/L (ref 3–11)
BUN: 26.6 mg/dL — AB (ref 7.0–26.0)
CO2: 29 meq/L (ref 22–29)
Calcium: 8.8 mg/dL (ref 8.4–10.4)
Chloride: 105 mEq/L (ref 98–109)
Creatinine: 1.3 mg/dL (ref 0.7–1.3)
GLUCOSE: 108 mg/dL (ref 70–140)
POTASSIUM: 4.3 meq/L (ref 3.5–5.1)
Sodium: 140 mEq/L (ref 136–145)
Total Bilirubin: 0.58 mg/dL (ref 0.20–1.20)
Total Protein: 5.8 g/dL — ABNORMAL LOW (ref 6.4–8.3)

## 2014-05-21 NOTE — Patient Instructions (Signed)

## 2014-05-21 NOTE — Progress Notes (Signed)
Pt. Arrived for phlebotomy. HCT= 44.4. Tolerated phlebotomy well. Given beverage and snack. VSS pre and post phlebotomy.

## 2014-06-03 DIAGNOSIS — R7302 Impaired glucose tolerance (oral): Secondary | ICD-10-CM | POA: Diagnosis not present

## 2014-06-03 DIAGNOSIS — R2689 Other abnormalities of gait and mobility: Secondary | ICD-10-CM | POA: Diagnosis not present

## 2014-06-03 DIAGNOSIS — R51 Headache: Secondary | ICD-10-CM | POA: Diagnosis not present

## 2014-06-03 DIAGNOSIS — Z6828 Body mass index (BMI) 28.0-28.9, adult: Secondary | ICD-10-CM | POA: Diagnosis not present

## 2014-06-17 ENCOUNTER — Other Ambulatory Visit: Payer: Self-pay | Admitting: *Deleted

## 2014-06-17 ENCOUNTER — Other Ambulatory Visit: Payer: Self-pay

## 2014-06-17 DIAGNOSIS — D45 Polycythemia vera: Secondary | ICD-10-CM

## 2014-06-18 ENCOUNTER — Other Ambulatory Visit (HOSPITAL_BASED_OUTPATIENT_CLINIC_OR_DEPARTMENT_OTHER): Payer: Medicare Other

## 2014-06-18 ENCOUNTER — Ambulatory Visit (HOSPITAL_BASED_OUTPATIENT_CLINIC_OR_DEPARTMENT_OTHER): Payer: Medicare Other

## 2014-06-18 DIAGNOSIS — D45 Polycythemia vera: Secondary | ICD-10-CM

## 2014-06-18 LAB — CBC WITH DIFFERENTIAL/PLATELET
BASO%: 2 % (ref 0.0–2.0)
BASOS ABS: 0.4 10*3/uL — AB (ref 0.0–0.1)
EOS ABS: 0.2 10*3/uL (ref 0.0–0.5)
EOS%: 1 % (ref 0.0–7.0)
HCT: 43.6 % (ref 38.4–49.9)
HGB: 12.8 g/dL — ABNORMAL LOW (ref 13.0–17.1)
LYMPH#: 1.1 10*3/uL (ref 0.9–3.3)
LYMPH%: 6.1 % — ABNORMAL LOW (ref 14.0–49.0)
MCH: 25.9 pg — AB (ref 27.2–33.4)
MCHC: 29.4 g/dL — ABNORMAL LOW (ref 32.0–36.0)
MCV: 88.3 fL (ref 79.3–98.0)
MONO#: 1.1 10*3/uL — ABNORMAL HIGH (ref 0.1–0.9)
MONO%: 6 % (ref 0.0–14.0)
NEUT#: 15.8 10*3/uL — ABNORMAL HIGH (ref 1.5–6.5)
NEUT%: 84.9 % — ABNORMAL HIGH (ref 39.0–75.0)
NRBC: 0 % (ref 0–0)
Platelets: 108 10*3/uL — ABNORMAL LOW (ref 140–400)
RBC: 4.94 10*6/uL (ref 4.20–5.82)
RDW: 15.9 % — ABNORMAL HIGH (ref 11.0–14.6)
WBC: 18.6 10*3/uL — AB (ref 4.0–10.3)

## 2014-06-18 NOTE — Progress Notes (Signed)
[id number] Phlebotomy to left [facility] performed without difficulty with 563 cc drawn off.  Pt tolerated procedure well; no c/o dizziness voiced; is eating/drinking at present.

## 2014-06-18 NOTE — Patient Instructions (Signed)

## 2014-07-16 ENCOUNTER — Other Ambulatory Visit (HOSPITAL_BASED_OUTPATIENT_CLINIC_OR_DEPARTMENT_OTHER): Payer: Medicare Other

## 2014-07-16 DIAGNOSIS — D45 Polycythemia vera: Secondary | ICD-10-CM

## 2014-07-16 LAB — CBC WITH DIFFERENTIAL/PLATELET
BASO%: 2.3 % — ABNORMAL HIGH (ref 0.0–2.0)
BASOS ABS: 0.4 10*3/uL — AB (ref 0.0–0.1)
EOS ABS: 0.2 10*3/uL (ref 0.0–0.5)
EOS%: 0.9 % (ref 0.0–7.0)
HEMATOCRIT: 41 % (ref 38.4–49.9)
HEMOGLOBIN: 12 g/dL — AB (ref 13.0–17.1)
LYMPH%: 6.3 % — AB (ref 14.0–49.0)
MCH: 24.9 pg — ABNORMAL LOW (ref 27.2–33.4)
MCHC: 29.2 g/dL — ABNORMAL LOW (ref 32.0–36.0)
MCV: 85.3 fL (ref 79.3–98.0)
MONO#: 0.8 10*3/uL (ref 0.1–0.9)
MONO%: 4.4 % (ref 0.0–14.0)
NEUT%: 86.1 % — ABNORMAL HIGH (ref 39.0–75.0)
NEUTROS ABS: 15.3 10*3/uL — AB (ref 1.5–6.5)
PLATELETS: 121 10*3/uL — AB (ref 140–400)
RBC: 4.8 10*6/uL (ref 4.20–5.82)
RDW: 16 % — ABNORMAL HIGH (ref 11.0–14.6)
WBC: 17.7 10*3/uL — ABNORMAL HIGH (ref 4.0–10.3)
lymph#: 1.1 10*3/uL (ref 0.9–3.3)

## 2014-08-05 ENCOUNTER — Telehealth: Payer: Self-pay | Admitting: Hematology

## 2014-08-05 NOTE — Telephone Encounter (Signed)
, °

## 2014-08-13 ENCOUNTER — Ambulatory Visit: Payer: Medicare Other

## 2014-08-13 ENCOUNTER — Other Ambulatory Visit (HOSPITAL_BASED_OUTPATIENT_CLINIC_OR_DEPARTMENT_OTHER): Payer: Medicare Other

## 2014-08-13 DIAGNOSIS — D45 Polycythemia vera: Secondary | ICD-10-CM | POA: Diagnosis not present

## 2014-08-13 LAB — CBC WITH DIFFERENTIAL/PLATELET
BASO%: 3.1 % — ABNORMAL HIGH (ref 0.0–2.0)
BASOS ABS: 0.5 10*3/uL — AB (ref 0.0–0.1)
EOS ABS: 0.2 10*3/uL (ref 0.0–0.5)
EOS%: 1.2 % (ref 0.0–7.0)
HCT: 42.8 % (ref 38.4–49.9)
HGB: 12.3 g/dL — ABNORMAL LOW (ref 13.0–17.1)
LYMPH%: 5.4 % — AB (ref 14.0–49.0)
MCH: 24.6 pg — AB (ref 27.2–33.4)
MCHC: 28.7 g/dL — ABNORMAL LOW (ref 32.0–36.0)
MCV: 85.9 fL (ref 79.3–98.0)
MONO#: 0.6 10*3/uL (ref 0.1–0.9)
MONO%: 4 % (ref 0.0–14.0)
NEUT%: 86.3 % — ABNORMAL HIGH (ref 39.0–75.0)
NEUTROS ABS: 13.9 10*3/uL — AB (ref 1.5–6.5)
Platelets: 118 10*3/uL — ABNORMAL LOW (ref 140–400)
RBC: 4.98 10*6/uL (ref 4.20–5.82)
RDW: 16.4 % — AB (ref 11.0–14.6)
WBC: 16.1 10*3/uL — ABNORMAL HIGH (ref 4.0–10.3)
lymph#: 0.9 10*3/uL (ref 0.9–3.3)

## 2014-08-13 NOTE — Progress Notes (Signed)
Reviewed patient's lab results. HGB is 42.8.  Reviewed last offcie visit note from Dr. Juliann Mule and he states no phlebotomy necessary for HGB <43. Call made to Dr. Burr Medico to confirm as she is pt's oncologist now.  She states no phlebotomy needed today. Pt. Has f/u appt. In February with Dr. Burr Medico. Explained above to pt and he voiced understanding.

## 2014-08-31 ENCOUNTER — Ambulatory Visit: Payer: Medicare Other

## 2014-08-31 ENCOUNTER — Other Ambulatory Visit: Payer: Medicare Other

## 2014-09-09 ENCOUNTER — Other Ambulatory Visit: Payer: Self-pay | Admitting: *Deleted

## 2014-09-09 DIAGNOSIS — D45 Polycythemia vera: Secondary | ICD-10-CM

## 2014-09-10 ENCOUNTER — Ambulatory Visit (HOSPITAL_BASED_OUTPATIENT_CLINIC_OR_DEPARTMENT_OTHER): Payer: Medicare Other | Admitting: Hematology

## 2014-09-10 ENCOUNTER — Telehealth: Payer: Self-pay | Admitting: Hematology

## 2014-09-10 ENCOUNTER — Ambulatory Visit (HOSPITAL_BASED_OUTPATIENT_CLINIC_OR_DEPARTMENT_OTHER): Payer: Medicare Other

## 2014-09-10 ENCOUNTER — Other Ambulatory Visit (HOSPITAL_BASED_OUTPATIENT_CLINIC_OR_DEPARTMENT_OTHER): Payer: Medicare Other

## 2014-09-10 VITALS — BP 132/58 | HR 71 | Temp 97.9°F | Resp 18 | Ht 71.0 in | Wt 195.3 lb

## 2014-09-10 DIAGNOSIS — D72829 Elevated white blood cell count, unspecified: Secondary | ICD-10-CM | POA: Diagnosis not present

## 2014-09-10 DIAGNOSIS — D45 Polycythemia vera: Secondary | ICD-10-CM

## 2014-09-10 DIAGNOSIS — Z86718 Personal history of other venous thrombosis and embolism: Secondary | ICD-10-CM | POA: Diagnosis not present

## 2014-09-10 DIAGNOSIS — D6959 Other secondary thrombocytopenia: Secondary | ICD-10-CM

## 2014-09-10 LAB — BASIC METABOLIC PANEL (CC13)
ANION GAP: 9 meq/L (ref 3–11)
BUN: 29.6 mg/dL — AB (ref 7.0–26.0)
CALCIUM: 8.8 mg/dL (ref 8.4–10.4)
CO2: 24 mEq/L (ref 22–29)
CREATININE: 1.3 mg/dL (ref 0.7–1.3)
Chloride: 106 mEq/L (ref 98–109)
EGFR: 47 mL/min/{1.73_m2} — AB (ref >90)
Glucose: 103 mg/dl (ref 70–140)
Potassium: 4.5 mEq/L (ref 3.5–5.1)
Sodium: 139 mEq/L (ref 136–145)

## 2014-09-10 LAB — CBC WITH DIFFERENTIAL/PLATELET
BASO%: 1.8 % (ref 0.0–2.0)
BASOS ABS: 0.3 10*3/uL — AB (ref 0.0–0.1)
EOS ABS: 0.1 10*3/uL (ref 0.0–0.5)
EOS%: 0.7 % (ref 0.0–7.0)
HEMATOCRIT: 44.2 % (ref 38.4–49.9)
HEMOGLOBIN: 13.2 g/dL (ref 13.0–17.1)
LYMPH%: 5.4 % — AB (ref 14.0–49.0)
MCH: 26.1 pg — AB (ref 27.2–33.4)
MCHC: 29.9 g/dL — AB (ref 32.0–36.0)
MCV: 87.4 fL (ref 79.3–98.0)
MONO#: 1.1 10*3/uL — AB (ref 0.1–0.9)
MONO%: 5.9 % (ref 0.0–14.0)
NEUT#: 15.4 10*3/uL — ABNORMAL HIGH (ref 1.5–6.5)
NEUT%: 86.2 % — AB (ref 39.0–75.0)
PLATELETS: 103 10*3/uL — AB (ref 140–400)
RBC: 5.06 10*6/uL (ref 4.20–5.82)
RDW: 15.8 % — ABNORMAL HIGH (ref 11.0–14.6)
WBC: 17.8 10*3/uL — ABNORMAL HIGH (ref 4.0–10.3)
lymph#: 1 10*3/uL (ref 0.9–3.3)
nRBC: 0 % (ref 0–0)

## 2014-09-10 LAB — LACTATE DEHYDROGENASE (CC13): LDH: 251 U/L — ABNORMAL HIGH (ref 125–245)

## 2014-09-10 NOTE — Progress Notes (Signed)
Pt here for phlebotomy after office visit.  Phlebotomy performed in left antecubital easily with 16G needle.  Approx. 160 gm of blood obtained and wasted.  Blood clotted.  Needle d/c intact, pressure dressing applied.  Pt tolerated procedure without difficulty.   Retried phlebotomy with 18G x 1 1/4 in angiocath in right posterior forearm.  Good blood flashback but clotted.  Unable to obtain any blood for phlebotomy.  Dr. Burr Medico notified.  OK for pt to come back either next week or week after to repeat phlebotomy - no labs needed, or if pt wished to wait until next office visit and phlebotomy after visit.  Explanations given to pt.  Pt wished to come back in 2 weeks for phlebotomy.   Instructed pt to increase po fluids ( water ) intake at home as tolerated prior to phlebotomy.  Pt voiced understanding.  Pt was stable at discharge via self ambulation.

## 2014-09-10 NOTE — Telephone Encounter (Signed)
s.w pt and he is getting sched from [facility]

## 2014-09-10 NOTE — Progress Notes (Signed)
[facility] OFFICE PROGRESS NOTE  Tivis Ringer, MD [place] [address]  DIAGNOSIS: Polycythemia vera - Plan: CBC & Diff and Retic  Chief Complaint  Patient presents with  . Follow-up    CURRENT TREATMENT:  Phlebotomy and hydroxyurea 500 mg daily. Xalreto 20 mg started about mid-February 2015 and off after 6 month.   INTERVAL HISTORY: [name] [name] 81 y.o. male with a history of Polycythemia vera dating back to 1997, JAK2 mutation positive is here for follow up.  He was last seen by Dr. Juliann Mule 6 month ago. He is off Eval[facility] Field now. He had phlebotomy 10 times in 2015. He has been tolerated well without issues. He feels well overall, denies any chest pain, dyspnea, or other complaints. He has good energy level and appetite.  MEDICAL HISTORY: Past Medical History  Diagnosis Date  . Polycythemia vera(238.4)   . Gout   . HTN (hypertension)   . Depression   . BPH (benign prostatic hypertrophy)   . Dyslipidemia   . Mild aortic stenosis   . DJD (degenerative joint disease)   . Bone marrow disease     INTERIM HISTORY: has Polycythemia vera; Aneurysm of unspecified site; and DVT (deep venous thrombosis) on his problem list.    ALLERGIES:  is allergic to colchicine.  MEDICATIONS: has a current medication list which includes the following prescription(s): allopurinol, alprazolam, aspirin, escitalopram, fish oil-omega-3 fatty acids, hydroxyurea, multivitamin, terazosin, vitamin e, and lisinopril.  SURGICAL HISTORY:  Past Surgical History  Procedure Laterality Date  . Hemorroidectomy      REVIEW OF SYSTEMS:   Constitutional: Denies fevers, chills or abnormal weight loss Eyes: Denies blurriness of vision Ears, nose, mouth, throat, and face: Denies mucositis or sore throat Respiratory: Denies cough, dyspnea or wheezes Cardiovascular: Denies palpitation, chest discomfort or lower extremity swelling Gastrointestinal:  Denies nausea, heartburn or  change in bowel habits Skin: Denies abnormal skin rashes Lymphatics: Denies new lymphadenopathy or easy bruising Neurological:Denies numbness, tingling or new weaknesses Behavioral/Psych: Mood is stable, no new changes  All other systems were reviewed with the patient and are negative.  PHYSICAL EXAMINATION: ECOG PERFORMANCE STATUS: 0 - Asymptomatic  Blood pressure 132/58, pulse 71, temperature 97.9 F (36.6 C), temperature source Oral, resp. rate 18, height 5\' 11"  (1.803 m), weight 195 lb 4.8 oz (88.587 kg), SpO2 97 %.  General: Easily ambulatory.  HEENT: Sclerae anicteric. Conjunctivae were pink. Pupils round and reactive bilaterally. Oral mucosa is moist without ulceration or thrush.  No occipital, submandibular, cervical, supraclavicular or axillar adenopathy.  Lungs: clear to auscultation without wheezes. No rales or rhonchi.  Heart: regular rate and rhythm. No gallop or rubs. 2/6 systolic murmur.  Abdomen: soft, non tender. No guarding or rebound tenderness. Bowel sounds are present. No palpable hepatosplenomegaly.  MSK: no focal spinal tenderness.  Extremities: No clubbing or cyanosis.No calf tenderness to palpitation, no peripheral edema. The patient had grossly intact strength in upper and lower extremities.  Skin exam was without ecchymosis, petechiae.  Neuro: non-focal, alert and oriented to time, person and place, appropriate affect   Labs:  CBC Latest Ref Rng 09/10/2014 08/13/2014 07/16/2014  WBC 4.0 - 10.3 10e3/uL 17.8(H) 16.1(H) 17.7(H)  Hemoglobin 13.0 - 17.1 g/dL 13.2 12.3(L) 12.0(L)  Hematocrit 38.4 - 49.9 % 44.2 42.8 41.0  Platelets 140 - 400 10e3/uL 103(L) 118(L) 121(L)   CMP Latest Ref Rng 09/10/2014 05/21/2014 04/23/2014  Glucose 70 - 140 mg/dl 103 108 107  BUN 7.0 - 26.0 mg/dL  29.6(H) 26.6(H) 21.8  Creatinine 0.7 - 1.3 mg/dL 1.3 1.3 1.2  Sodium 136 - 145 mEq/L 139 140 141  Potassium 3.5 - 5.1 mEq/L 4.5 4.3 4.2  Chloride 98 - 107 mEq/L - - -  CO2 22 - 29 mEq/L  24 29 27   Calcium 8.4 - 10.4 mg/dL 8.8 8.8 9.0  Total Protein 6.4 - 8.3 g/dL - 5.8(L) 6.1(L)  Total Bilirubin 0.20 - 1.20 mg/dL - 0.58 0.60  Alkaline Phos 40 - 150 U/L - 72 72  AST 5 - 34 U/L - 20 22  ALT 0 - 55 U/L - 19 18    RADIOGRAPHIC STUDIES: No results found.  ASSESSMENT: [name] [name] 81 y.o. male with a history of Polycythemia vera - Plan: CBC & Diff and Retic   PLAN:  1. Polycythemia vera dating back to 1997, JAK2 mutation positive.  - Continue aspirin.  - Continue Hydroxyurea 500 mg p.o. daily.  - CBC every 6 weeks  - Continue phlebotomy hct greater than 45. He will have phlebotomy today.    2. RLE DVT likely secondary to #1.  --He completed treatment for six months. Counseled to call with any symptoms of lower extremity pain or swelling or shortness of breath.  Risks associated with bleeding secondary to diverticular disease, his age and falls outweigh potential benefit of clot reduction.  If more clots, he may require anticoagulation indefinitely.  Patient was agreeable with this plan.   3. Neutrophilia secondary to polycythemia vera.   4. Thrombocytopenia secondary to Hydroxyurea.   5. Follow up in 3 months.   PLAN:  -pleubotomy today -CBC every 6 weeks and plembotomy if HCT>45% -rtc IN 3 MONTH  All questions were answered. The patient knows to call the clinic with any problems, questions or concerns. We can certainly see the patient much sooner if necessary.  I spent 15 minutes counseling the patient face to face. The total time spent in the appointment was 25 minutes.    Truitt Merle, MD 09/10/2014 3:42 PM

## 2014-09-10 NOTE — Patient Instructions (Signed)

## 2014-09-12 ENCOUNTER — Encounter: Payer: Self-pay | Admitting: Hematology

## 2014-09-24 ENCOUNTER — Ambulatory Visit (HOSPITAL_BASED_OUTPATIENT_CLINIC_OR_DEPARTMENT_OTHER): Payer: Medicare Other

## 2014-09-24 ENCOUNTER — Other Ambulatory Visit: Payer: Self-pay | Admitting: *Deleted

## 2014-09-24 ENCOUNTER — Ambulatory Visit: Payer: Medicare Other

## 2014-09-24 DIAGNOSIS — D72829 Elevated white blood cell count, unspecified: Secondary | ICD-10-CM

## 2014-09-24 DIAGNOSIS — D6959 Other secondary thrombocytopenia: Secondary | ICD-10-CM

## 2014-09-24 DIAGNOSIS — D45 Polycythemia vera: Secondary | ICD-10-CM

## 2014-09-24 LAB — CBC & DIFF AND RETIC
BASO%: 1.9 % (ref 0.0–2.0)
Basophils Absolute: 0.3 10*3/uL — ABNORMAL HIGH (ref 0.0–0.1)
EOS%: 0.8 % (ref 0.0–7.0)
Eosinophils Absolute: 0.1 10*3/uL (ref 0.0–0.5)
HCT: 43.3 % (ref 38.4–49.9)
HGB: 12.7 g/dL — ABNORMAL LOW (ref 13.0–17.1)
Immature Retic Fract: 16.3 % — ABNORMAL HIGH (ref 3.00–10.60)
LYMPH%: 6.1 % — ABNORMAL LOW (ref 14.0–49.0)
MCH: 26.1 pg — ABNORMAL LOW (ref 27.2–33.4)
MCHC: 29.3 g/dL — ABNORMAL LOW (ref 32.0–36.0)
MCV: 89.1 fL (ref 79.3–98.0)
MONO#: 0.9 10*3/uL (ref 0.1–0.9)
MONO%: 5.1 % (ref 0.0–14.0)
NEUT#: 14.7 10*3/uL — ABNORMAL HIGH (ref 1.5–6.5)
NEUT%: 86.1 % — ABNORMAL HIGH (ref 39.0–75.0)
Platelets: 89 10*3/uL — ABNORMAL LOW (ref 140–400)
RBC: 4.86 10*6/uL (ref 4.20–5.82)
RDW: 15.9 % — ABNORMAL HIGH (ref 11.0–14.6)
Retic %: 1.23 % (ref 0.80–1.80)
Retic Ct Abs: 59.78 10*3/uL (ref 34.80–93.90)
WBC: 17.1 10*3/uL — ABNORMAL HIGH (ref 4.0–10.3)
lymph#: 1.1 10*3/uL (ref 0.9–3.3)

## 2014-09-24 NOTE — Progress Notes (Signed)
HCT 43.3%  No phlebotomy required. Dr. Burr Medico aware. Pt. Made aware.

## 2014-09-25 DIAGNOSIS — I10 Essential (primary) hypertension: Secondary | ICD-10-CM | POA: Diagnosis not present

## 2014-09-25 DIAGNOSIS — M109 Gout, unspecified: Secondary | ICD-10-CM | POA: Diagnosis not present

## 2014-09-25 DIAGNOSIS — Z125 Encounter for screening for malignant neoplasm of prostate: Secondary | ICD-10-CM | POA: Diagnosis not present

## 2014-09-25 DIAGNOSIS — E785 Hyperlipidemia, unspecified: Secondary | ICD-10-CM | POA: Diagnosis not present

## 2014-09-25 DIAGNOSIS — R7302 Impaired glucose tolerance (oral): Secondary | ICD-10-CM | POA: Diagnosis not present

## 2014-10-02 DIAGNOSIS — R7302 Impaired glucose tolerance (oral): Secondary | ICD-10-CM | POA: Diagnosis not present

## 2014-10-02 DIAGNOSIS — R2689 Other abnormalities of gait and mobility: Secondary | ICD-10-CM | POA: Diagnosis not present

## 2014-10-02 DIAGNOSIS — Z23 Encounter for immunization: Secondary | ICD-10-CM | POA: Diagnosis not present

## 2014-10-02 DIAGNOSIS — I8290 Acute embolism and thrombosis of unspecified vein: Secondary | ICD-10-CM | POA: Diagnosis not present

## 2014-10-02 DIAGNOSIS — F329 Major depressive disorder, single episode, unspecified: Secondary | ICD-10-CM | POA: Diagnosis not present

## 2014-10-02 DIAGNOSIS — Z6828 Body mass index (BMI) 28.0-28.9, adult: Secondary | ICD-10-CM | POA: Diagnosis not present

## 2014-10-02 DIAGNOSIS — I350 Nonrheumatic aortic (valve) stenosis: Secondary | ICD-10-CM | POA: Diagnosis not present

## 2014-10-02 DIAGNOSIS — Z0000 Encounter for general adult medical examination without abnormal findings: Secondary | ICD-10-CM | POA: Diagnosis not present

## 2014-10-02 DIAGNOSIS — I720 Aneurysm of carotid artery: Secondary | ICD-10-CM | POA: Diagnosis not present

## 2014-10-02 DIAGNOSIS — M109 Gout, unspecified: Secondary | ICD-10-CM | POA: Diagnosis not present

## 2014-10-02 DIAGNOSIS — Z1389 Encounter for screening for other disorder: Secondary | ICD-10-CM | POA: Diagnosis not present

## 2014-10-02 DIAGNOSIS — D45 Polycythemia vera: Secondary | ICD-10-CM | POA: Diagnosis not present

## 2014-10-15 DIAGNOSIS — Z1212 Encounter for screening for malignant neoplasm of rectum: Secondary | ICD-10-CM | POA: Diagnosis not present

## 2014-10-22 ENCOUNTER — Ambulatory Visit: Payer: Medicare Other

## 2014-10-22 ENCOUNTER — Other Ambulatory Visit (HOSPITAL_BASED_OUTPATIENT_CLINIC_OR_DEPARTMENT_OTHER): Payer: Medicare Other

## 2014-10-22 DIAGNOSIS — D45 Polycythemia vera: Secondary | ICD-10-CM

## 2014-10-22 LAB — CBC WITH DIFFERENTIAL/PLATELET
BASO%: 0.5 % (ref 0.0–2.0)
Basophils Absolute: 0.1 10*3/uL (ref 0.0–0.1)
EOS ABS: 0.2 10*3/uL (ref 0.0–0.5)
EOS%: 1.3 % (ref 0.0–7.0)
HCT: 44 % (ref 38.4–49.9)
HEMOGLOBIN: 13 g/dL (ref 13.0–17.1)
LYMPH#: 1.1 10*3/uL (ref 0.9–3.3)
LYMPH%: 6.3 % — ABNORMAL LOW (ref 14.0–49.0)
MCH: 25.1 pg — ABNORMAL LOW (ref 27.2–33.4)
MCHC: 29.5 g/dL — ABNORMAL LOW (ref 32.0–36.0)
MCV: 85.2 fL (ref 79.3–98.0)
MONO#: 0.9 10*3/uL (ref 0.1–0.9)
MONO%: 5 % (ref 0.0–14.0)
NEUT%: 86.9 % — AB (ref 39.0–75.0)
NEUTROS ABS: 15.7 10*3/uL — AB (ref 1.5–6.5)
Platelets: 104 10*3/uL — ABNORMAL LOW (ref 140–400)
RBC: 5.17 10*6/uL (ref 4.20–5.82)
RDW: 16.9 % — AB (ref 11.0–14.6)
WBC: 18.1 10*3/uL — AB (ref 4.0–10.3)

## 2014-10-22 NOTE — Progress Notes (Signed)
No phlebotomy necessary today.  HCT is 44.0, per office note, phlebotomy to be performed if HCT greater than or equal to 45.0.  Etta Grandchild, RN spoke to pt in lobby, pt stated he is feeling fine.  Copy of labs given to patient.  Pt verbalized understanding and discharged.

## 2014-11-27 ENCOUNTER — Telehealth: Payer: Self-pay | Admitting: Hematology

## 2014-11-27 NOTE — Telephone Encounter (Signed)
per Dr Burr Medico to move tp to LT sch-cld & spoke to pt & gave tp time & date of appt-pt understoo

## 2014-12-02 ENCOUNTER — Other Ambulatory Visit (HOSPITAL_BASED_OUTPATIENT_CLINIC_OR_DEPARTMENT_OTHER): Payer: Medicare Other

## 2014-12-02 ENCOUNTER — Telehealth: Payer: Self-pay | Admitting: Hematology

## 2014-12-02 ENCOUNTER — Ambulatory Visit (HOSPITAL_BASED_OUTPATIENT_CLINIC_OR_DEPARTMENT_OTHER): Payer: Medicare Other

## 2014-12-02 ENCOUNTER — Ambulatory Visit (HOSPITAL_BASED_OUTPATIENT_CLINIC_OR_DEPARTMENT_OTHER): Payer: Medicare Other | Admitting: Nurse Practitioner

## 2014-12-02 VITALS — BP 124/71 | HR 76 | Temp 98.0°F | Resp 18 | Ht 71.0 in | Wt 195.2 lb

## 2014-12-02 DIAGNOSIS — D6959 Other secondary thrombocytopenia: Secondary | ICD-10-CM

## 2014-12-02 DIAGNOSIS — Z86718 Personal history of other venous thrombosis and embolism: Secondary | ICD-10-CM | POA: Diagnosis not present

## 2014-12-02 DIAGNOSIS — D45 Polycythemia vera: Secondary | ICD-10-CM

## 2014-12-02 DIAGNOSIS — Z7982 Long term (current) use of aspirin: Secondary | ICD-10-CM | POA: Diagnosis not present

## 2014-12-02 LAB — CBC WITH DIFFERENTIAL/PLATELET
BASO%: 4.2 % — AB (ref 0.0–2.0)
BASOS ABS: 0.7 10*3/uL — AB (ref 0.0–0.1)
EOS%: 1.3 % (ref 0.0–7.0)
Eosinophils Absolute: 0.2 10*3/uL (ref 0.0–0.5)
HEMATOCRIT: 46.4 % (ref 38.4–49.9)
HEMOGLOBIN: 13.6 g/dL (ref 13.0–17.1)
LYMPH%: 5.9 % — ABNORMAL LOW (ref 14.0–49.0)
MCH: 25.4 pg — ABNORMAL LOW (ref 27.2–33.4)
MCHC: 29.3 g/dL — ABNORMAL LOW (ref 32.0–36.0)
MCV: 86.5 fL (ref 79.3–98.0)
MONO#: 0.8 10*3/uL (ref 0.1–0.9)
MONO%: 4.6 % (ref 0.0–14.0)
NEUT#: 14.6 10*3/uL — ABNORMAL HIGH (ref 1.5–6.5)
NEUT%: 84 % — ABNORMAL HIGH (ref 39.0–75.0)
Platelets: 117 10*3/uL — ABNORMAL LOW (ref 140–400)
RBC: 5.37 10*6/uL (ref 4.20–5.82)
RDW: 16.6 % — ABNORMAL HIGH (ref 11.0–14.6)
WBC: 17.4 10*3/uL — ABNORMAL HIGH (ref 4.0–10.3)
lymph#: 1 10*3/uL (ref 0.9–3.3)

## 2014-12-02 NOTE — Progress Notes (Signed)
  [facility] OFFICE PROGRESS NOTE   Diagnosis:  Polycythemia vera  CURRENT TREATMENT: Phlebotomy if hematocrit greater than 45% and hydroxyurea 500 mg daily. Xarelto 20 mg started about mid-February 2015 and discontinued after approximately 6 months (right lower extremity DVT).  INTERVAL HISTORY:   [name] [name] returns as scheduled. Last phlebotomy 09/10/2014. He continues hydroxyurea 500 mg daily. He denies nausea/vomiting. No mouth sores. No diarrhea. No rash. He denies any bleeding. No signs of a blood clot. Overall good energy level and good appetite. He is the primary caregiver for his wife. He reports she has "mobility issues".  Objective:  Vital signs in last 24 hours:  Blood pressure 124/71, pulse 76, temperature 98 F (36.7 C), temperature source Oral, resp. rate 18, height 5\' 11"  (1.803 m), weight 195 lb 3.2 oz (88.542 kg), SpO2 96 %.    HEENT: No thrush or ulcers. Lymphatics: No palpable cervical, supraclavicular or axillary lymph nodes. Resp: Lungs clear bilaterally. Cardio: Regular rate and rhythm. GI: Abdomen soft and nontender. No organomegaly. Vascular: Trace lower leg edema bilaterally right slightly greater than left. Neuro: Alert and oriented.  Skin: Ecchymoses scattered over forearms.    Lab Results:  Lab Results  Component Value Date   WBC 17.4* 12/02/2014   HGB 13.6 12/02/2014   HCT 46.4 12/02/2014   MCV 86.5 12/02/2014   PLT 117* 12/02/2014   NEUTROABS 14.6* 12/02/2014    Imaging:  No results found.  Medications: I have reviewed the patient's current medications.  Assessment/Plan: 1. Polycythemia vera dating to 1997, JAK2 mutation positive; on Hydrea 500 mg daily; aspirin daily; CBC every 6 weeks with phlebotomy for hematocrit greater than 45%. 2. History of right lower extremity DVT likely secondary to #1. He completed 6 months of anticoagulation. 3. Neutrophilia secondary to #1. 4. Thrombocytopenia secondary to  hydroxyurea.   Disposition: [name] [name] appears stable. He will continue hydroxyurea at the current dose. He takes a daily aspirin. He will receive a phlebotomy today. Plan to continue CBC every 6 weeks with phlebotomy for hematocrit greater than 45%. We scheduled a follow-up visit in 3 months. He will contact the office in the interim with any problems. We specifically discussed signs/symptoms of DVT.    [name] [name] ANP/GNP-BC   12/02/2014  2:56 PM

## 2014-12-02 NOTE — Telephone Encounter (Signed)
Gave and printed printed appt sched and avs fo rpt for June and Aug

## 2014-12-02 NOTE — Patient Instructions (Signed)

## 2014-12-02 NOTE — Progress Notes (Signed)
Phlebotomy performed on patient. 500 grams removed via (R) AC. Patient tolerated well. Drinks/snacks provided before and after phlebotomy. 30 minutes post observation and patient discharged in stable condition.

## 2014-12-03 ENCOUNTER — Ambulatory Visit: Payer: Commercial Indemnity | Admitting: Hematology

## 2014-12-03 ENCOUNTER — Other Ambulatory Visit: Payer: Commercial Indemnity

## 2015-01-13 ENCOUNTER — Ambulatory Visit (HOSPITAL_BASED_OUTPATIENT_CLINIC_OR_DEPARTMENT_OTHER): Payer: Medicare Other

## 2015-01-13 ENCOUNTER — Other Ambulatory Visit (HOSPITAL_BASED_OUTPATIENT_CLINIC_OR_DEPARTMENT_OTHER): Payer: Medicare Other

## 2015-01-13 VITALS — BP 106/56 | HR 77 | Temp 97.7°F | Resp 18

## 2015-01-13 DIAGNOSIS — D45 Polycythemia vera: Secondary | ICD-10-CM

## 2015-01-13 LAB — CBC WITH DIFFERENTIAL/PLATELET
BASO%: 2 % (ref 0.0–2.0)
Basophils Absolute: 0.4 10*3/uL — ABNORMAL HIGH (ref 0.0–0.1)
EOS ABS: 0.2 10*3/uL (ref 0.0–0.5)
EOS%: 0.8 % (ref 0.0–7.0)
HCT: 46.2 % (ref 38.4–49.9)
HEMOGLOBIN: 13.9 g/dL (ref 13.0–17.1)
LYMPH%: 6.8 % — AB (ref 14.0–49.0)
MCH: 26.6 pg — ABNORMAL LOW (ref 27.2–33.4)
MCHC: 30.1 g/dL — ABNORMAL LOW (ref 32.0–36.0)
MCV: 88.3 fL (ref 79.3–98.0)
MONO#: 1 10*3/uL — ABNORMAL HIGH (ref 0.1–0.9)
MONO%: 5.4 % (ref 0.0–14.0)
NEUT#: 15 10*3/uL — ABNORMAL HIGH (ref 1.5–6.5)
NEUT%: 85 % — ABNORMAL HIGH (ref 39.0–75.0)
PLATELETS: 103 10*3/uL — AB (ref 140–400)
RBC: 5.23 10*6/uL (ref 4.20–5.82)
RDW: 15.9 % — ABNORMAL HIGH (ref 11.0–14.6)
WBC: 17.7 10*3/uL — ABNORMAL HIGH (ref 4.0–10.3)
lymph#: 1.2 10*3/uL (ref 0.9–3.3)
nRBC: 0 % (ref 0–0)

## 2015-01-13 NOTE — Patient Instructions (Signed)

## 2015-01-13 NOTE — Progress Notes (Signed)
Phlebotomy done to [facility] with 16G phlebotomy set without difficulty. Snacks/drink given after phlebotomy observed 30 mins post phlebotomy. Pt discharged in no acute distress, ambulatory

## 2015-02-04 DIAGNOSIS — R600 Localized edema: Secondary | ICD-10-CM | POA: Diagnosis not present

## 2015-02-04 DIAGNOSIS — L299 Pruritus, unspecified: Secondary | ICD-10-CM | POA: Diagnosis not present

## 2015-02-04 DIAGNOSIS — T7849XA Other allergy, initial encounter: Secondary | ICD-10-CM | POA: Diagnosis not present

## 2015-02-04 DIAGNOSIS — Z6827 Body mass index (BMI) 27.0-27.9, adult: Secondary | ICD-10-CM | POA: Diagnosis not present

## 2015-02-24 ENCOUNTER — Ambulatory Visit (HOSPITAL_BASED_OUTPATIENT_CLINIC_OR_DEPARTMENT_OTHER): Payer: Medicare Other | Admitting: Hematology

## 2015-02-24 ENCOUNTER — Telehealth: Payer: Self-pay | Admitting: Hematology

## 2015-02-24 ENCOUNTER — Encounter: Payer: Self-pay | Admitting: Hematology

## 2015-02-24 ENCOUNTER — Other Ambulatory Visit (HOSPITAL_BASED_OUTPATIENT_CLINIC_OR_DEPARTMENT_OTHER): Payer: Medicare Other

## 2015-02-24 VITALS — BP 122/45 | HR 79 | Temp 98.3°F | Resp 18 | Ht 71.0 in | Wt 187.3 lb

## 2015-02-24 DIAGNOSIS — Z7982 Long term (current) use of aspirin: Secondary | ICD-10-CM

## 2015-02-24 DIAGNOSIS — D45 Polycythemia vera: Secondary | ICD-10-CM | POA: Diagnosis not present

## 2015-02-24 DIAGNOSIS — Z86718 Personal history of other venous thrombosis and embolism: Secondary | ICD-10-CM

## 2015-02-24 DIAGNOSIS — D6959 Other secondary thrombocytopenia: Secondary | ICD-10-CM

## 2015-02-24 LAB — CBC WITH DIFFERENTIAL/PLATELET
BASO%: 2.1 % — ABNORMAL HIGH (ref 0.0–2.0)
Basophils Absolute: 0.3 10*3/uL — ABNORMAL HIGH (ref 0.0–0.1)
EOS%: 0.7 % (ref 0.0–7.0)
Eosinophils Absolute: 0.1 10*3/uL (ref 0.0–0.5)
HCT: 42.1 % (ref 38.4–49.9)
HGB: 12.7 g/dL — ABNORMAL LOW (ref 13.0–17.1)
LYMPH%: 6.3 % — ABNORMAL LOW (ref 14.0–49.0)
MCH: 27.2 pg (ref 27.2–33.4)
MCHC: 30.2 g/dL — AB (ref 32.0–36.0)
MCV: 90.1 fL (ref 79.3–98.0)
MONO#: 0.8 10*3/uL (ref 0.1–0.9)
MONO%: 5.4 % (ref 0.0–14.0)
NEUT#: 12.9 10*3/uL — ABNORMAL HIGH (ref 1.5–6.5)
NEUT%: 85.5 % — ABNORMAL HIGH (ref 39.0–75.0)
PLATELETS: 79 10*3/uL — AB (ref 140–400)
RBC: 4.67 10*6/uL (ref 4.20–5.82)
RDW: 16.3 % — ABNORMAL HIGH (ref 11.0–14.6)
WBC: 15 10*3/uL — ABNORMAL HIGH (ref 4.0–10.3)
lymph#: 0.9 10*3/uL (ref 0.9–3.3)
nRBC: 0 % (ref 0–0)
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
## 2015-02-24 NOTE — Telephone Encounter (Signed)
Gave patient avs report and appointments for September thru November  °

## 2015-03-01 DIAGNOSIS — I10 Essential (primary) hypertension: Secondary | ICD-10-CM | POA: Diagnosis not present

## 2015-03-01 DIAGNOSIS — Z6827 Body mass index (BMI) 27.0-27.9, adult: Secondary | ICD-10-CM | POA: Diagnosis not present

## 2015-03-01 DIAGNOSIS — D45 Polycythemia vera: Secondary | ICD-10-CM | POA: Diagnosis not present

## 2015-03-01 DIAGNOSIS — R600 Localized edema: Secondary | ICD-10-CM | POA: Diagnosis not present

## 2015-03-01 DIAGNOSIS — I8290 Acute embolism and thrombosis of unspecified vein: Secondary | ICD-10-CM | POA: Diagnosis not present

## 2015-03-02 ENCOUNTER — Ambulatory Visit
Admission: RE | Admit: 2015-03-02 | Discharge: 2015-03-02 | Disposition: A | Discharge status: Home / Self Care | Payer: Medicare Other | Source: Ambulatory Visit | Attending: Internal Medicine | Admitting: Internal Medicine

## 2015-03-02 ENCOUNTER — Other Ambulatory Visit: Payer: Self-pay | Admitting: Internal Medicine

## 2015-03-02 DIAGNOSIS — R609 Edema, unspecified: Secondary | ICD-10-CM

## 2015-03-02 DIAGNOSIS — I159 Secondary hypertension, unspecified: Secondary | ICD-10-CM

## 2015-03-02 DIAGNOSIS — I824Z2 Acute embolism and thrombosis of unspecified deep veins of left distal lower extremity: Secondary | ICD-10-CM | POA: Diagnosis not present

## 2015-03-25 ENCOUNTER — Ambulatory Visit: Payer: Medicare Other

## 2015-03-25 ENCOUNTER — Telehealth: Payer: Self-pay | Admitting: *Deleted

## 2015-03-25 ENCOUNTER — Other Ambulatory Visit (HOSPITAL_BASED_OUTPATIENT_CLINIC_OR_DEPARTMENT_OTHER): Payer: Medicare Other

## 2015-03-25 DIAGNOSIS — D45 Polycythemia vera: Secondary | ICD-10-CM

## 2015-03-25 LAB — CBC WITH DIFFERENTIAL/PLATELET
BASO%: 1.9 % (ref 0.0–2.0)
BASOS ABS: 0.4 10*3/uL — AB (ref 0.0–0.1)
EOS%: 1 % (ref 0.0–7.0)
Eosinophils Absolute: 0.2 10*3/uL (ref 0.0–0.5)
HCT: 44.3 % (ref 38.4–49.9)
HEMOGLOBIN: 13.4 g/dL (ref 13.0–17.1)
LYMPH%: 4.1 % — ABNORMAL LOW (ref 14.0–49.0)
MCH: 26 pg — AB (ref 27.2–33.4)
MCHC: 30.3 g/dL — ABNORMAL LOW (ref 32.0–36.0)
MCV: 85.7 fL (ref 79.3–98.0)
MONO#: 0.8 10*3/uL (ref 0.1–0.9)
MONO%: 3.9 % (ref 0.0–14.0)
NEUT#: 19 10*3/uL — ABNORMAL HIGH (ref 1.5–6.5)
NEUT%: 89.1 % — ABNORMAL HIGH (ref 39.0–75.0)
Platelets: 155 10*3/uL (ref 140–400)
RBC: 5.17 10*6/uL (ref 4.20–5.82)
RDW: 15.6 % — ABNORMAL HIGH (ref 11.0–14.6)
WBC: 21.3 10*3/uL — AB (ref 4.0–10.3)
lymph#: 0.9 10*3/uL (ref 0.9–3.3)

## 2015-03-25 LAB — BASIC METABOLIC PANEL (CC13)
ANION GAP: 8 meq/L (ref 3–11)
BUN: 20.7 mg/dL (ref 7.0–26.0)
CO2: 28 meq/L (ref 22–29)
Calcium: 9.1 mg/dL (ref 8.4–10.4)
Chloride: 103 mEq/L (ref 98–109)
Creatinine: 1.3 mg/dL (ref 0.7–1.3)
EGFR: 49 mL/min/{1.73_m2} — ABNORMAL LOW (ref >90)
Glucose: 83 mg/dl (ref 70–140)
Potassium: 4.6 mEq/L (ref 3.5–5.1)
Sodium: 139 mEq/L (ref 136–145)

## 2015-03-25 NOTE — Telephone Encounter (Signed)
Pt in for labs & possible phlebotomy & states he has had some health changes since last appt & wants to make sure MD knows.  Went to lobby to talk with pt & he reports having blood clot L leg & went to PCP & clot verified.  He was placed on xarelto & ASA was stopped.  He just wanted to make sure his MD here knew this.  Informed that Dr Burr Medico would be made aware.  Labs OK & no need for phlebotomy today per Dr Burr Medico.  Dr. Burr Medico also agreed with xarelto & states pt should stay on this drug.

## 2015-03-25 NOTE — Progress Notes (Signed)
HGB <45. No phlebotomy needed today. Pt aware.

## 2015-04-02 DIAGNOSIS — I10 Essential (primary) hypertension: Secondary | ICD-10-CM | POA: Diagnosis not present

## 2015-04-02 DIAGNOSIS — I8290 Acute embolism and thrombosis of unspecified vein: Secondary | ICD-10-CM | POA: Diagnosis not present

## 2015-04-02 DIAGNOSIS — R2689 Other abnormalities of gait and mobility: Secondary | ICD-10-CM | POA: Diagnosis not present

## 2015-04-02 DIAGNOSIS — Z6826 Body mass index (BMI) 26.0-26.9, adult: Secondary | ICD-10-CM | POA: Diagnosis not present

## 2015-04-02 DIAGNOSIS — M1990 Unspecified osteoarthritis, unspecified site: Secondary | ICD-10-CM | POA: Diagnosis not present

## 2015-04-02 DIAGNOSIS — Z23 Encounter for immunization: Secondary | ICD-10-CM | POA: Diagnosis not present

## 2015-04-02 DIAGNOSIS — D45 Polycythemia vera: Secondary | ICD-10-CM | POA: Diagnosis not present

## 2015-04-22 ENCOUNTER — Encounter: Payer: Self-pay | Admitting: Hematology

## 2015-04-22 ENCOUNTER — Telehealth: Payer: Self-pay | Admitting: Hematology

## 2015-04-22 ENCOUNTER — Ambulatory Visit (HOSPITAL_BASED_OUTPATIENT_CLINIC_OR_DEPARTMENT_OTHER): Payer: Medicare Other | Admitting: Hematology

## 2015-04-22 ENCOUNTER — Other Ambulatory Visit (HOSPITAL_BASED_OUTPATIENT_CLINIC_OR_DEPARTMENT_OTHER): Payer: Medicare Other

## 2015-04-22 VITALS — BP 114/53 | HR 74 | Temp 97.9°F | Resp 18 | Ht 71.0 in | Wt 188.8 lb

## 2015-04-22 DIAGNOSIS — D45 Polycythemia vera: Secondary | ICD-10-CM | POA: Diagnosis not present

## 2015-04-22 DIAGNOSIS — M79605 Pain in left leg: Secondary | ICD-10-CM

## 2015-04-22 DIAGNOSIS — I82401 Acute embolism and thrombosis of unspecified deep veins of right lower extremity: Secondary | ICD-10-CM

## 2015-04-22 LAB — CBC WITH DIFFERENTIAL/PLATELET
BASO%: 1.6 % (ref 0.0–2.0)
Basophils Absolute: 0.4 10*3/uL — ABNORMAL HIGH (ref 0.0–0.1)
EOS ABS: 0.2 10*3/uL (ref 0.0–0.5)
EOS%: 1 % (ref 0.0–7.0)
HEMATOCRIT: 44.6 % (ref 38.4–49.9)
HEMOGLOBIN: 13.3 g/dL (ref 13.0–17.1)
LYMPH#: 1.2 10*3/uL (ref 0.9–3.3)
LYMPH%: 5.3 % — AB (ref 14.0–49.0)
MCH: 25.7 pg — AB (ref 27.2–33.4)
MCHC: 29.8 g/dL — AB (ref 32.0–36.0)
MCV: 86.3 fL (ref 79.3–98.0)
MONO#: 1.2 10*3/uL — AB (ref 0.1–0.9)
MONO%: 5.1 % (ref 0.0–14.0)
NEUT#: 20.2 10*3/uL — ABNORMAL HIGH (ref 1.5–6.5)
NEUT%: 87 % — AB (ref 39.0–75.0)
PLATELETS: 142 10*3/uL (ref 140–400)
RBC: 5.17 10*6/uL (ref 4.20–5.82)
RDW: 15.4 % — ABNORMAL HIGH (ref 11.0–14.6)
WBC: 23.2 10*3/uL — ABNORMAL HIGH (ref 4.0–10.3)
nRBC: 0 % (ref 0–0)

## 2015-04-22 NOTE — Telephone Encounter (Signed)
Gave patient avs report and appointments for November 2016 and January 2017.  °

## 2015-04-22 NOTE — Progress Notes (Signed)
[facility] OFFICE PROGRESS NOTE  Tivis Ringer, MD [place] [address]  DIAGNOSIS: Polycythemia vera ([place])   CURRENT TREATMENT:   1. Phlebotomy and hydroxyurea 500 mg daily (since 2011) .  2.hydrea decreased to 500mg  daily 4-5 days/week since 02/2015  3. Xalreto 20 mg started about mid-February 2015 and off after 6 month, restarted again in 02/2015 due to left LE DVT  INTERVAL HISTORY: MILFERD ANSELL 81 y.o. male with a history of Polycythemia vera dating back to 1997, JAK2 mutation positive is here for follow up.  He developed left lower extremity DVT in August 2016, and was started on Xalreto. His leg swelling has improved, but has not resolved. He also has some left leg pain occasionally. He otherwise doing very well. He takes Hydrea 4 days a week, tolerating well. No other new complaints.   MEDICAL HISTORY: Past Medical History  Diagnosis Date  . Polycythemia vera(238.4)   . Gout   . HTN (hypertension)   . Depression   . BPH (benign prostatic hypertrophy)   . Dyslipidemia   . Mild aortic stenosis   . DJD (degenerative joint disease)   . Bone marrow disease     INTERIM HISTORY: has Polycythemia vera ([place]); Aneurysm of unspecified site [facility]); and DVT (deep venous thrombosis) ([place]) on his problem list.    ALLERGIES:  is allergic to amoxicillin and colchicine.  MEDICATIONS: has a current medication list which includes the following prescription(s): allopurinol, alprazolam, aspirin, escitalopram, fish oil-omega-3 fatty acids, hydroxyurea, lisinopril, multivitamin, rivaroxaban, terazosin, and vitamin e.  SURGICAL HISTORY:  Past Surgical History  Procedure Laterality Date  . Hemorroidectomy      REVIEW OF SYSTEMS:   Constitutional: Denies fevers, chills or abnormal weight loss Eyes: Denies blurriness of vision Ears, nose, mouth, throat, and face: Denies mucositis or sore throat Respiratory: Denies cough, dyspnea or  wheezes Cardiovascular: Denies palpitation, chest discomfort or lower extremity swelling Gastrointestinal:  Denies nausea, heartburn or change in bowel habits Skin: Denies abnormal skin rashes Lymphatics: Denies new lymphadenopathy or easy bruising Neurological:Denies numbness, tingling or new weaknesses Behavioral/Psych: Mood is stable, no new changes  All other systems were reviewed with the patient and are negative.  PHYSICAL EXAMINATION: ECOG PERFORMANCE STATUS: 0 - Asymptomatic  Blood pressure 114/53, pulse 74, temperature 97.9 F (36.6 C), temperature source Oral, resp. rate 18, height 5\' 11"  (1.803 m), weight 188 lb 12.8 oz (85.639 kg), SpO2 95 %.  General: Easily ambulatory.  HEENT: Sclerae anicteric. Conjunctivae were pink. Pupils round and reactive bilaterally. Oral mucosa is moist without ulceration or thrush.  No occipital, submandibular, cervical, supraclavicular or axillar adenopathy.  Lungs: clear to auscultation without wheezes. No rales or rhonchi.  Heart: regular rate and rhythm. No gallop or rubs. 2/6 systolic murmur.  Abdomen: soft, non tender. No guarding or rebound tenderness. Bowel sounds are present. No palpable hepatosplenomegaly.  MSK: no focal spinal tenderness.  Extremities: No clubbing or cyanosis.No calf tenderness to palpitation, no peripheral edema. The patient had grossly intact strength in upper and lower extremities.  Skin exam was without ecchymosis, petechiae.  Neuro: non-focal, alert and oriented to time, person and place, appropriate affect   Labs:  CBC Latest Ref Rng 04/22/2015 03/25/2015 02/24/2015  WBC 4.0 - 10.3 10e3/uL 23.2(H) 21.3(H) 15.0(H)  Hemoglobin 13.0 - 17.1 g/dL 13.3 13.4 12.7(L)  Hematocrit 38.4 - 49.9 % 44.6 44.3 42.1  Platelets 140 - 400 10e3/uL 142 155 79(L)   CMP Latest Ref Rng 03/25/2015 09/10/2014  05/21/2014  Glucose 70 - 140 mg/dl 83 103 108  BUN 7.0 - 26.0 mg/dL 20.7 29.6(H) 26.6(H)  Creatinine 0.7 - 1.3 mg/dL 1.3 1.3 1.3   Sodium 136 - 145 mEq/L 139 139 140  Potassium 3.5 - 5.1 mEq/L 4.6 4.5 4.3  Chloride 98 - 107 mEq/L - - -  CO2 22 - 29 mEq/L 28 24 29   Calcium 8.4 - 10.4 mg/dL 9.1 8.8 8.8  Total Protein 6.4 - 8.3 g/dL - - 5.8(L)  Total Bilirubin 0.20 - 1.20 mg/dL - - 0.58  Alkaline Phos 40 - 150 U/L - - 72  AST 5 - 34 U/L - - 20  ALT 0 - 55 U/L - - 19    RADIOGRAPHIC STUDIES: No results found.  ASSESSMENT: [name] [name] 81 y.o. male with a history of Polycythemia vera ([place])   PLAN:  1. Polycythemia vera dating back to 1997, JAK2 mutation positive.  -We reviewed the nature history of PV, most people do very well, some people would develop myelofibrosis or leukemia late on. The major complication is of thrombosis. -He has been on Hydrea 500 mg daily for 4-5 years, due to the worsening thrombocytopenia, I decreased to 500 mg 4 days a week (M, W, F, Sat and Sun) -His thrombocytopenia has resolved, his white count has been in the 20 range since we decreased his Hydrea. Hematocrit 44.6 today. -I will increase his Hydrea to 500 mg daily except Monday and Friday. -We'll continue phlebotomy if his hematocrit above 45 -He is on Xarelto now, he has stopped aspirin.  2 RLE DVT, LLE in 02/2015, likely secondary to #1 -I recommend him to continue Xarelto indefinitely, due to recurrent DVT. -He knows to be careful about fall and injury   PLAN:  -increase Hydrea to 500 mg 5 days a week -CBC every 6 weeks and plembotomy if HCT>45% -Return to clinic in 3 months   All questions were answered. The patient knows to call the clinic with any problems, questions or concerns. We can certainly see the patient much sooner if necessary.  I spent 15 minutes counseling the patient face to face. The total time spent in the appointment was 25 minutes.    Truitt Merle, MD 04/22/2015   2:26 PM

## 2015-04-22 NOTE — Telephone Encounter (Signed)
Add to previous not lab/phelb for 11/3 moved to 11/15 due to per 10/6 pof lab 6 weeks. Per patient lab/phleb now at 6 weeks.

## 2015-04-23 ENCOUNTER — Encounter: Payer: Self-pay | Admitting: *Deleted

## 2015-05-20 ENCOUNTER — Other Ambulatory Visit: Payer: Commercial Indemnity

## 2015-05-31 ENCOUNTER — Telehealth: Payer: Self-pay | Admitting: *Deleted

## 2015-05-31 NOTE — Telephone Encounter (Signed)
Patient called and requested to move his appts from 11/15 to 11/16. Appts moved and patient contacted.

## 2015-06-01 ENCOUNTER — Other Ambulatory Visit: Payer: Medicare Other

## 2015-06-02 ENCOUNTER — Ambulatory Visit (HOSPITAL_BASED_OUTPATIENT_CLINIC_OR_DEPARTMENT_OTHER): Payer: Medicare Other

## 2015-06-02 ENCOUNTER — Other Ambulatory Visit (HOSPITAL_BASED_OUTPATIENT_CLINIC_OR_DEPARTMENT_OTHER): Payer: Medicare Other

## 2015-06-02 DIAGNOSIS — D45 Polycythemia vera: Secondary | ICD-10-CM | POA: Diagnosis present

## 2015-06-02 LAB — CBC WITH DIFFERENTIAL/PLATELET
BASO%: 1 % (ref 0.0–2.0)
Basophils Absolute: 0.3 10*3/uL — ABNORMAL HIGH (ref 0.0–0.1)
EOS%: 1 % (ref 0.0–7.0)
Eosinophils Absolute: 0.2 10*3/uL (ref 0.0–0.5)
HCT: 46.6 % (ref 38.4–49.9)
HGB: 13.7 g/dL (ref 13.0–17.1)
LYMPH%: 5.3 % — AB (ref 14.0–49.0)
MCH: 24.3 pg — ABNORMAL LOW (ref 27.2–33.4)
MCHC: 29.3 g/dL — AB (ref 32.0–36.0)
MCV: 83 fL (ref 79.3–98.0)
MONO#: 1.1 10*3/uL — AB (ref 0.1–0.9)
MONO%: 4.6 % (ref 0.0–14.0)
NEUT%: 88.1 % — AB (ref 39.0–75.0)
NEUTROS ABS: 22 10*3/uL — AB (ref 1.5–6.5)
PLATELETS: 142 10*3/uL (ref 140–400)
RBC: 5.62 10*6/uL (ref 4.20–5.82)
RDW: 16.9 % — ABNORMAL HIGH (ref 11.0–14.6)
WBC: 25 10*3/uL — AB (ref 4.0–10.3)
lymph#: 1.3 10*3/uL (ref 0.9–3.3)

## 2015-06-02 NOTE — Patient Instructions (Signed)
Therapeutic Phlebotomy, Care After  Refer to this sheet in the next few weeks. These instructions provide you with information about caring for yourself after your procedure. Your health care provider may also give you more specific instructions. Your treatment has been planned according to current medical practices, but problems sometimes occur. Call your health care provider if you have any problems or questions after your procedure.  WHAT TO EXPECT AFTER THE PROCEDURE  After your procedure, it is common to have:   Light-headedness or dizziness. You may feel faint.   Nausea.   Tiredness.  HOME CARE INSTRUCTIONS  Activities   Return to your normal activities as directed by your health care provider. Most people can go back to their normal activities right away.   Avoid strenuous physical activity and heavy lifting or pulling for about 5 hours after the procedure. Do not lift anything that is heavier than 10 lb (4.5 kg).   Athletes should avoid strenuous exercise for at least 12 hours.   Change positions slowly for the remainder of the day. This will help to prevent light-headedness or fainting.   If you feel light-headed, lie down until the feeling goes away.  Eating and Drinking   Be sure to eat well-balanced meals for the next 24 hours.   Drink enough fluid to keep your urine clear or pale yellow.   Avoid drinking alcohol on the day that you had the procedure.  Care of the Needle Insertion Site   Keep your bandage dry. You can remove the bandage after about 5 hours or as directed by your health care provider.   If you have bleeding from the needle insertion site, elevate your arm and press firmly on the site until the bleeding stops.   If you have bruising at the site, apply ice to the area:   Put ice in a plastic bag.   Place a towel between your skin and the bag.   Leave the ice on for 20 minutes, 2-3 times a day for the first 24 hours.   If the swelling does not go away after 24 hours, apply  a warm, moist washcloth to the area for 20 minutes, 2-3 times a day.  General Instructions   Avoid smoking for at least 30 minutes after the procedure.   Keep all follow-up visits as directed by your health care provider. It is important to continue with further therapeutic phlebotomy treatments as directed.  SEEK MEDICAL CARE IF:   You have redness, swelling, or pain at the needle insertion site.   You have fluid, blood, or pus coming from the needle insertion site.   You feel light-headed, dizzy, or nauseated, and the feeling does not go away.   You notice new bruising at the needle insertion site.   You feel weaker than normal.   You have a fever or chills.  SEEK IMMEDIATE MEDICAL CARE IF:   You have severe nausea or vomiting.   You have chest pain.   You have trouble breathing.    This information is not intended to replace advice given to you by your health care provider. Make sure you discuss any questions you have with your health care provider.    Document Released: 12/05/2010 Document Revised: 11/17/2014 Document Reviewed: 06/29/2014  Elsevier Interactive Patient Education 2016 [employer]

## 2015-06-04 DIAGNOSIS — W0110XA Fall on same level from slipping, tripping and stumbling with subsequent striking against unspecified object, initial encounter: Secondary | ICD-10-CM | POA: Diagnosis not present

## 2015-06-04 DIAGNOSIS — Z6826 Body mass index (BMI) 26.0-26.9, adult: Secondary | ICD-10-CM | POA: Diagnosis not present

## 2015-06-04 DIAGNOSIS — S40811A Abrasion of right upper arm, initial encounter: Secondary | ICD-10-CM | POA: Diagnosis not present

## 2015-07-20 ENCOUNTER — Telehealth: Payer: Self-pay | Admitting: *Deleted

## 2015-07-20 ENCOUNTER — Other Ambulatory Visit (HOSPITAL_BASED_OUTPATIENT_CLINIC_OR_DEPARTMENT_OTHER): Payer: Medicare Other

## 2015-07-20 ENCOUNTER — Ambulatory Visit (HOSPITAL_BASED_OUTPATIENT_CLINIC_OR_DEPARTMENT_OTHER): Payer: Medicare Other | Admitting: Hematology

## 2015-07-20 ENCOUNTER — Telehealth: Payer: Self-pay | Admitting: Hematology

## 2015-07-20 ENCOUNTER — Encounter: Payer: Self-pay | Admitting: Hematology

## 2015-07-20 VITALS — BP 142/49 | HR 77 | Temp 98.1°F | Resp 18 | Ht 71.0 in | Wt 182.5 lb

## 2015-07-20 DIAGNOSIS — D45 Polycythemia vera: Secondary | ICD-10-CM

## 2015-07-20 DIAGNOSIS — I82402 Acute embolism and thrombosis of unspecified deep veins of left lower extremity: Secondary | ICD-10-CM

## 2015-07-20 DIAGNOSIS — I82401 Acute embolism and thrombosis of unspecified deep veins of right lower extremity: Secondary | ICD-10-CM

## 2015-07-20 DIAGNOSIS — D696 Thrombocytopenia, unspecified: Secondary | ICD-10-CM

## 2015-07-20 LAB — CBC WITH DIFFERENTIAL/PLATELET
BASO%: 2 % (ref 0.0–2.0)
Basophils Absolute: 0.4 10e3/uL — ABNORMAL HIGH (ref 0.0–0.1)
EOS%: 1.2 % (ref 0.0–7.0)
Eosinophils Absolute: 0.2 10e3/uL (ref 0.0–0.5)
HCT: 45.2 % (ref 38.4–49.9)
HGB: 13.4 g/dL (ref 13.0–17.1)
LYMPH%: 6.6 % — ABNORMAL LOW (ref 14.0–49.0)
MCH: 24.9 pg — ABNORMAL LOW (ref 27.2–33.4)
MCHC: 29.6 g/dL — ABNORMAL LOW (ref 32.0–36.0)
MCV: 84 fL (ref 79.3–98.0)
MONO#: 1.2 10e3/uL — ABNORMAL HIGH (ref 0.1–0.9)
MONO%: 5.8 % (ref 0.0–14.0)
NEUT#: 17.5 10e3/uL — ABNORMAL HIGH (ref 1.5–6.5)
NEUT%: 84.4 % — ABNORMAL HIGH (ref 39.0–75.0)
Platelets: 136 10e3/uL — ABNORMAL LOW (ref 140–400)
RBC: 5.38 10e6/uL (ref 4.20–5.82)
RDW: 17.5 % — ABNORMAL HIGH (ref 11.0–14.6)
WBC: 20.7 10e3/uL — ABNORMAL HIGH (ref 4.0–10.3)
lymph#: 1.4 10e3/uL (ref 0.9–3.3)
nRBC: 0 % (ref 0–0)

## 2015-07-20 NOTE — Telephone Encounter (Signed)
Talked to patient here in office. Scheduled appt. Sent request for phlebotomies to be scheduled.       [facility].

## 2015-07-20 NOTE — Telephone Encounter (Signed)
Per staff message and POF I have scheduled appts. Advised scheduler of appts. JMW  

## 2015-07-20 NOTE — Progress Notes (Signed)
[facility] OFFICE PROGRESS NOTE  Tivis Ringer, MD [place] [address]  DIAGNOSIS: Polycythemia vera ([place])   CURRENT TREATMENT:   1. Phlebotomy and hydroxyurea 500 mg daily (since 2011) .  2.hydrea decreased to 500mg  daily 4-5 days/week since 02/2015  3. Xalreto 20 mg started about mid-February 2015 and off after 6 month, restarted again in 02/2015 due to left LE DVT  INTERVAL HISTORY: [name] [name] 82 y.o. male with a history of Polycythemia vera dating back to 1997, JAK2 mutation positive is here for follow up.  He is doing well overall.  His wife had hip surgery recently  And is still on outpatient rehab. He has been taking her since her surgery. He has mild fatigue, but functions very well. No other complains.  He is compliant with Hydrea, tolerating well without significant side effects.   MEDICAL HISTORY: Past Medical History  Diagnosis Date  . Polycythemia vera(238.4)   . Gout   . HTN (hypertension)   . Depression   . BPH (benign prostatic hypertrophy)   . Dyslipidemia   . Mild aortic stenosis   . DJD (degenerative joint disease)   . Bone marrow disease     INTERIM HISTORY: has Polycythemia vera ([place]); Aneurysm of unspecified site [facility]); and DVT (deep venous thrombosis) ([place]) on his problem list.    ALLERGIES:  is allergic to amoxicillin and colchicine.  MEDICATIONS: has a current medication list which includes the following prescription(s): acetaminophen, allopurinol, alprazolam, escitalopram, fish oil-omega-3 fatty acids, hydroxyurea, lisinopril, multivitamin, rivaroxaban, terazosin, and vitamin e.  SURGICAL HISTORY:  Past Surgical History  Procedure Laterality Date  . Hemorroidectomy      REVIEW OF SYSTEMS:   Constitutional: Denies fevers, chills or abnormal weight loss Eyes: Denies blurriness of vision Ears, nose, mouth, throat, and face: Denies mucositis or sore throat Respiratory: Denies cough, dyspnea or  wheezes Cardiovascular: Denies palpitation, chest discomfort or lower extremity swelling Gastrointestinal:  Denies nausea, heartburn or change in bowel habits Skin: Denies abnormal skin rashes Lymphatics: Denies new lymphadenopathy or easy bruising Neurological:Denies numbness, tingling or new weaknesses Behavioral/Psych: Mood is stable, no new changes  All other systems were reviewed with the patient and are negative.  PHYSICAL EXAMINATION: ECOG PERFORMANCE STATUS: 0 - Asymptomatic  Blood pressure 142/49, pulse 77, temperature 98.1 F (36.7 C), temperature source Oral, resp. rate 18, height 5\' 11"  (1.803 m), weight 182 lb 8 oz (82.781 kg), SpO2 97 %.  General: Easily ambulatory.  HEENT: Sclerae anicteric. Conjunctivae were pink. Pupils round and reactive bilaterally. Oral mucosa is moist without ulceration or thrush.  No occipital, submandibular, cervical, supraclavicular or axillar adenopathy.  Lungs: clear to auscultation without wheezes. No rales or rhonchi.  Heart: regular rate and rhythm. No gallop or rubs. 2/6 systolic murmur.  Abdomen: soft, non tender. No guarding or rebound tenderness. Bowel sounds are present. No palpable hepatosplenomegaly.  MSK: no focal spinal tenderness.  Extremities: No clubbing or cyanosis.No calf tenderness to palpitation, no peripheral edema. The patient had grossly intact strength in upper and lower extremities.  Skin exam was without ecchymosis, petechiae.  Neuro: non-focal, alert and oriented to time, person and place, appropriate affect   Labs:  CBC Latest Ref Rng 07/20/2015 06/02/2015 04/22/2015  WBC 4.0 - 10.3 10e3/uL 20.7(H) 25.0(H) 23.2(H)  Hemoglobin 13.0 - 17.1 g/dL 13.4 13.7 13.3  Hematocrit 38.4 - 49.9 % 45.2 46.6 44.6  Platelets 140 - 400 10e3/uL 136(L) 142 142   CMP Latest Ref Rng 03/25/2015  09/10/2014 05/21/2014  Glucose 70 - 140 mg/dl 83 103 108  BUN 7.0 - 26.0 mg/dL 20.7 29.6(H) 26.6(H)  Creatinine 0.7 - 1.3 mg/dL 1.3 1.3 1.3  Sodium  136 - 145 mEq/L 139 139 140  Potassium 3.5 - 5.1 mEq/L 4.6 4.5 4.3  Chloride 98 - 107 mEq/L - - -  CO2 22 - 29 mEq/L 28 24 29   Calcium 8.4 - 10.4 mg/dL 9.1 8.8 8.8  Total Protein 6.4 - 8.3 g/dL - - 5.8(L)  Total Bilirubin 0.20 - 1.20 mg/dL - - 0.58  Alkaline Phos 40 - 150 U/L - - 72  AST 5 - 34 U/L - - 20  ALT 0 - 55 U/L - - 19    RADIOGRAPHIC STUDIES: No results found.  ASSESSMENT: [name] [name] 82 y.o. male with a history of Polycythemia vera ([place])   PLAN:  1. Polycythemia vera diagnosed in 1997, JAK2 mutation positive.  -We reviewed the nature history of PV, most people do very well, some people would develop myelofibrosis or leukemia late on. The major complication is thrombosis. -He has been on Hydrea 500 mg daily for 4-5 years, due to the worsening thrombocytopenia, I decreased to 500 mg 4 days a week (M, W, F, Sat and Sun) -His thrombocytopenia has resolved, but his WBC has increased to 20K's since we decreased his Hydrea. H/H also slightly increased. So I increased his Hydrea to 500 mg daily except Monday and Friday. -His WBC has dropped some, HCT 45% toay, plt 136 - I recommend her to continue Hydrea 500 mg daily except Monday and Friday  -He is on Xarelto now.  2 RLE DVT, LLE DVT in 02/2015, likely secondary to #1 -I recommend him to continue Xarelto indefinitely, due to recurrent DVT. -He knows to be careful about fall and injury  3.  Thrombocytopenia - secondary to Hydrea. - we'll continue monitoring  PLAN:  -continue Hydrea to 500 mg 5 days a week -CBC every 4 weeks and plembotomy if HCT>45% -Return to clinic in 3 months   All questions were answered. The patient knows to call the clinic with any problems, questions or concerns. We can certainly see the patient much sooner if necessary.  I spent 15 minutes counseling the patient face to face. The total time spent in the appointment was 25 minutes.    Truitt Merle, MD 07/20/2015   3:21 PM

## 2015-08-17 ENCOUNTER — Telehealth: Payer: Self-pay | Admitting: *Deleted

## 2015-08-17 ENCOUNTER — Telehealth: Payer: Self-pay | Admitting: Hematology

## 2015-08-17 NOTE — Telephone Encounter (Signed)
Called patient and gave appt d/t for 02/02 @ 1:45 lab, 2:15 Phlebotomy

## 2015-08-17 NOTE — Telephone Encounter (Signed)
Per staff message from scheduler I have moved appt from 2/1 to 2/2. Advised scheduler to move lab

## 2015-08-18 ENCOUNTER — Other Ambulatory Visit: Payer: Medicare Other

## 2015-08-19 ENCOUNTER — Ambulatory Visit: Payer: Medicare Other

## 2015-08-19 ENCOUNTER — Other Ambulatory Visit (HOSPITAL_BASED_OUTPATIENT_CLINIC_OR_DEPARTMENT_OTHER): Payer: Medicare Other

## 2015-08-19 DIAGNOSIS — D45 Polycythemia vera: Secondary | ICD-10-CM

## 2015-08-19 LAB — CBC WITH DIFFERENTIAL/PLATELET
BASO%: 1.3 % (ref 0.0–2.0)
Basophils Absolute: 0.3 10*3/uL — ABNORMAL HIGH (ref 0.0–0.1)
EOS ABS: 0.2 10*3/uL (ref 0.0–0.5)
EOS%: 0.8 % (ref 0.0–7.0)
HCT: 45.1 % (ref 38.4–49.9)
HGB: 13.6 g/dL (ref 13.0–17.1)
LYMPH#: 1.1 10*3/uL (ref 0.9–3.3)
LYMPH%: 4.3 % — ABNORMAL LOW (ref 14.0–49.0)
MCH: 25.6 pg — ABNORMAL LOW (ref 27.2–33.4)
MCHC: 30.2 g/dL — ABNORMAL LOW (ref 32.0–36.0)
MCV: 84.8 fL (ref 79.3–98.0)
MONO#: 0.8 10*3/uL (ref 0.1–0.9)
MONO%: 3 % (ref 0.0–14.0)
NEUT%: 90.6 % — ABNORMAL HIGH (ref 39.0–75.0)
NEUTROS ABS: 23.9 10*3/uL — AB (ref 1.5–6.5)
NRBC: 0 % (ref 0–0)
PLATELETS: 124 10*3/uL — AB (ref 140–400)
RBC: 5.32 10*6/uL (ref 4.20–5.82)
RDW: 17.2 % — AB (ref 11.0–14.6)
WBC: 26.3 10*3/uL — ABNORMAL HIGH (ref 4.0–10.3)

## 2015-08-19 LAB — COMPREHENSIVE METABOLIC PANEL
ALT: 17 U/L (ref 0–55)
ANION GAP: 9 meq/L (ref 3–11)
AST: 22 U/L (ref 5–34)
Albumin: 3.6 g/dL (ref 3.5–5.0)
Alkaline Phosphatase: 81 U/L (ref 40–150)
BUN: 26.6 mg/dL — ABNORMAL HIGH (ref 7.0–26.0)
CHLORIDE: 105 meq/L (ref 98–109)
CO2: 26 meq/L (ref 22–29)
Calcium: 8.8 mg/dL (ref 8.4–10.4)
Creatinine: 1.3 mg/dL (ref 0.7–1.3)
EGFR: 48 mL/min/{1.73_m2} — AB (ref >90)
GLUCOSE: 80 mg/dL (ref 70–140)
Potassium: 4.4 mEq/L (ref 3.5–5.1)
SODIUM: 140 meq/L (ref 136–145)
Total Bilirubin: 0.54 mg/dL (ref 0.20–1.20)
Total Protein: 5.9 g/dL — ABNORMAL LOW (ref 6.4–8.3)

## 2015-08-19 NOTE — Progress Notes (Signed)
Hct  45.1  Today.  Dr. Burr Medico notified.  OK with md not to phlebotomize today if pt agrees.  Spoke with pt in the lobby.  Explanations given to pt and wife.  Pt opted not to have phlebotomy today.  Instructed pt to increase water po fluids intake as per Dr. Ernestina Penna instructions. Pt voiced understanding.  Pt aware of next returned appts.

## 2015-09-15 ENCOUNTER — Other Ambulatory Visit (HOSPITAL_BASED_OUTPATIENT_CLINIC_OR_DEPARTMENT_OTHER): Payer: Medicare Other

## 2015-09-15 ENCOUNTER — Ambulatory Visit (HOSPITAL_BASED_OUTPATIENT_CLINIC_OR_DEPARTMENT_OTHER): Payer: Medicare Other

## 2015-09-15 VITALS — BP 116/53 | HR 85 | Temp 97.5°F | Resp 18

## 2015-09-15 DIAGNOSIS — D45 Polycythemia vera: Secondary | ICD-10-CM

## 2015-09-15 LAB — CBC WITH DIFFERENTIAL/PLATELET
BASO%: 1.4 % (ref 0.0–2.0)
BASOS ABS: 0.4 10*3/uL — AB (ref 0.0–0.1)
EOS ABS: 0.2 10*3/uL (ref 0.0–0.5)
EOS%: 1 % (ref 0.0–7.0)
HCT: 45.9 % (ref 38.4–49.9)
HEMOGLOBIN: 13.8 g/dL (ref 13.0–17.1)
LYMPH#: 1.2 10*3/uL (ref 0.9–3.3)
LYMPH%: 5.1 % — ABNORMAL LOW (ref 14.0–49.0)
MCH: 25.7 pg — AB (ref 27.2–33.4)
MCHC: 30.1 g/dL — ABNORMAL LOW (ref 32.0–36.0)
MCV: 85.3 fL (ref 79.3–98.0)
MONO#: 1.1 10*3/uL — ABNORMAL HIGH (ref 0.1–0.9)
MONO%: 4.5 % (ref 0.0–14.0)
NEUT#: 21.6 10*3/uL — ABNORMAL HIGH (ref 1.5–6.5)
NEUT%: 88 % — AB (ref 39.0–75.0)
NRBC: 0 % (ref 0–0)
PLATELETS: 116 10*3/uL — AB (ref 140–400)
RBC: 5.38 10*6/uL (ref 4.20–5.82)
RDW: 17.1 % — AB (ref 11.0–14.6)
WBC: 24.5 10*3/uL — ABNORMAL HIGH (ref 4.0–10.3)

## 2015-09-15 NOTE — Patient Instructions (Signed)
Therapeutic Phlebotomy, Care After  Refer to this sheet in the next few weeks. These instructions provide you with information about caring for yourself after your procedure. Your health care provider may also give you more specific instructions. Your treatment has been planned according to current medical practices, but problems sometimes occur. Call your health care provider if you have any problems or questions after your procedure.  WHAT TO EXPECT AFTER THE PROCEDURE  After your procedure, it is common to have:   Light-headedness or dizziness. You may feel faint.   Nausea.   Tiredness.  HOME CARE INSTRUCTIONS  Activities   Return to your normal activities as directed by your health care provider. Most people can go back to their normal activities right away.   Avoid strenuous physical activity and heavy lifting or pulling for about 5 hours after the procedure. Do not lift anything that is heavier than 10 lb (4.5 kg).   Athletes should avoid strenuous exercise for at least 12 hours.   Change positions slowly for the remainder of the day. This will help to prevent light-headedness or fainting.   If you feel light-headed, lie down until the feeling goes away.  Eating and Drinking   Be sure to eat well-balanced meals for the next 24 hours.   Drink enough fluid to keep your urine clear or pale yellow.   Avoid drinking alcohol on the day that you had the procedure.  Care of the Needle Insertion Site   Keep your bandage dry. You can remove the bandage after about 5 hours or as directed by your health care provider.   If you have bleeding from the needle insertion site, elevate your arm and press firmly on the site until the bleeding stops.   If you have bruising at the site, apply ice to the area:   Put ice in a plastic bag.   Place a towel between your skin and the bag.   Leave the ice on for 20 minutes, 2-3 times a day for the first 24 hours.   If the swelling does not go away after 24 hours, apply  a warm, moist washcloth to the area for 20 minutes, 2-3 times a day.  General Instructions   Avoid smoking for at least 30 minutes after the procedure.   Keep all follow-up visits as directed by your health care provider. It is important to continue with further therapeutic phlebotomy treatments as directed.  SEEK MEDICAL CARE IF:   You have redness, swelling, or pain at the needle insertion site.   You have fluid, blood, or pus coming from the needle insertion site.   You feel light-headed, dizzy, or nauseated, and the feeling does not go away.   You notice new bruising at the needle insertion site.   You feel weaker than normal.   You have a fever or chills.  SEEK IMMEDIATE MEDICAL CARE IF:   You have severe nausea or vomiting.   You have chest pain.   You have trouble breathing.    This information is not intended to replace advice given to you by your health care provider. Make sure you discuss any questions you have with your health care provider.    Document Released: 12/05/2010 Document Revised: 11/17/2014 Document Reviewed: 06/29/2014  Elsevier Interactive Patient Education 2016 [employer]

## 2015-09-15 NOTE — Progress Notes (Signed)
Patient tolerated phlebotomy well.  PO fluids as well as graham crackers and peanut butter.  Patient states he feels fine.

## 2015-10-06 DIAGNOSIS — M12549 Traumatic arthropathy, unspecified hand: Secondary | ICD-10-CM | POA: Diagnosis not present

## 2015-10-06 DIAGNOSIS — Z1389 Encounter for screening for other disorder: Secondary | ICD-10-CM | POA: Diagnosis not present

## 2015-10-06 DIAGNOSIS — Z6826 Body mass index (BMI) 26.0-26.9, adult: Secondary | ICD-10-CM | POA: Diagnosis not present

## 2015-10-07 DIAGNOSIS — M79644 Pain in right finger(s): Secondary | ICD-10-CM | POA: Diagnosis not present

## 2015-10-07 DIAGNOSIS — S6992XA Unspecified injury of left wrist, hand and finger(s), initial encounter: Secondary | ICD-10-CM | POA: Diagnosis not present

## 2015-10-13 ENCOUNTER — Ambulatory Visit: Payer: Medicare Other

## 2015-10-13 ENCOUNTER — Telehealth: Payer: Self-pay | Admitting: Hematology

## 2015-10-13 ENCOUNTER — Other Ambulatory Visit: Payer: Self-pay | Admitting: Hematology

## 2015-10-13 ENCOUNTER — Other Ambulatory Visit (HOSPITAL_BASED_OUTPATIENT_CLINIC_OR_DEPARTMENT_OTHER): Payer: Medicare Other

## 2015-10-13 DIAGNOSIS — D45 Polycythemia vera: Secondary | ICD-10-CM

## 2015-10-13 LAB — CBC WITH DIFFERENTIAL/PLATELET
BASO%: 1.3 % (ref 0.0–2.0)
Basophils Absolute: 0.3 10*3/uL — ABNORMAL HIGH (ref 0.0–0.1)
EOS%: 0.8 % (ref 0.0–7.0)
Eosinophils Absolute: 0.2 10*3/uL (ref 0.0–0.5)
HEMATOCRIT: 44.1 % (ref 38.4–49.9)
HEMOGLOBIN: 13.1 g/dL (ref 13.0–17.1)
LYMPH#: 1 10*3/uL (ref 0.9–3.3)
LYMPH%: 4.6 % — ABNORMAL LOW (ref 14.0–49.0)
MCH: 25 pg — ABNORMAL LOW (ref 27.2–33.4)
MCHC: 29.6 g/dL — ABNORMAL LOW (ref 32.0–36.0)
MCV: 84.2 fL (ref 79.3–98.0)
MONO#: 1 10*3/uL — ABNORMAL HIGH (ref 0.1–0.9)
MONO%: 4.6 % (ref 0.0–14.0)
NEUT#: 19.4 10*3/uL — ABNORMAL HIGH (ref 1.5–6.5)
NEUT%: 88.7 % — ABNORMAL HIGH (ref 39.0–75.0)
Platelets: 128 10*3/uL — ABNORMAL LOW (ref 140–400)
RBC: 5.24 10*6/uL (ref 4.20–5.82)
RDW: 17.5 % — AB (ref 11.0–14.6)
WBC: 21.9 10*3/uL — ABNORMAL HIGH (ref 4.0–10.3)

## 2015-10-13 LAB — COMPREHENSIVE METABOLIC PANEL
ALBUMIN: 3.7 g/dL (ref 3.5–5.0)
ALK PHOS: 67 U/L (ref 40–150)
ALT: 16 U/L (ref 0–55)
AST: 22 U/L (ref 5–34)
Anion Gap: 7 mEq/L (ref 3–11)
BUN: 30.9 mg/dL — AB (ref 7.0–26.0)
CO2: 30 meq/L — AB (ref 22–29)
Calcium: 8.9 mg/dL (ref 8.4–10.4)
Chloride: 104 mEq/L (ref 98–109)
Creatinine: 1.3 mg/dL (ref 0.7–1.3)
EGFR: 48 mL/min/{1.73_m2} — AB (ref >90)
GLUCOSE: 91 mg/dL (ref 70–140)
POTASSIUM: 4.6 meq/L (ref 3.5–5.1)
SODIUM: 141 meq/L (ref 136–145)
TOTAL PROTEIN: 6.2 g/dL — AB (ref 6.4–8.3)
Total Bilirubin: 0.61 mg/dL (ref 0.20–1.20)

## 2015-10-13 NOTE — Telephone Encounter (Signed)
per pof to sch pt appt-per dr Burr Medico to sch 1 4 weeks-gave pt copy of avs-MW sch trmt

## 2015-10-15 DIAGNOSIS — I10 Essential (primary) hypertension: Secondary | ICD-10-CM | POA: Diagnosis not present

## 2015-10-15 DIAGNOSIS — R7302 Impaired glucose tolerance (oral): Secondary | ICD-10-CM | POA: Diagnosis not present

## 2015-10-15 DIAGNOSIS — M109 Gout, unspecified: Secondary | ICD-10-CM | POA: Diagnosis not present

## 2015-10-15 DIAGNOSIS — Z125 Encounter for screening for malignant neoplasm of prostate: Secondary | ICD-10-CM | POA: Diagnosis not present

## 2015-10-15 DIAGNOSIS — E784 Other hyperlipidemia: Secondary | ICD-10-CM | POA: Diagnosis not present

## 2015-10-19 ENCOUNTER — Ambulatory Visit: Payer: Medicare Other | Admitting: Hematology

## 2015-10-19 ENCOUNTER — Other Ambulatory Visit: Payer: Medicare Other

## 2015-10-22 DIAGNOSIS — D45 Polycythemia vera: Secondary | ICD-10-CM | POA: Diagnosis not present

## 2015-10-22 DIAGNOSIS — I8290 Acute embolism and thrombosis of unspecified vein: Secondary | ICD-10-CM | POA: Diagnosis not present

## 2015-10-22 DIAGNOSIS — I350 Nonrheumatic aortic (valve) stenosis: Secondary | ICD-10-CM | POA: Diagnosis not present

## 2015-10-22 DIAGNOSIS — E784 Other hyperlipidemia: Secondary | ICD-10-CM | POA: Diagnosis not present

## 2015-10-22 DIAGNOSIS — M109 Gout, unspecified: Secondary | ICD-10-CM | POA: Diagnosis not present

## 2015-10-22 DIAGNOSIS — Z0000 Encounter for general adult medical examination without abnormal findings: Secondary | ICD-10-CM | POA: Diagnosis not present

## 2015-10-22 DIAGNOSIS — I10 Essential (primary) hypertension: Secondary | ICD-10-CM | POA: Diagnosis not present

## 2015-10-22 DIAGNOSIS — F329 Major depressive disorder, single episode, unspecified: Secondary | ICD-10-CM | POA: Diagnosis not present

## 2015-10-22 DIAGNOSIS — M1990 Unspecified osteoarthritis, unspecified site: Secondary | ICD-10-CM | POA: Diagnosis not present

## 2015-10-22 DIAGNOSIS — I720 Aneurysm of carotid artery: Secondary | ICD-10-CM | POA: Diagnosis not present

## 2015-10-22 DIAGNOSIS — R7302 Impaired glucose tolerance (oral): Secondary | ICD-10-CM | POA: Diagnosis not present

## 2015-10-22 DIAGNOSIS — Z1389 Encounter for screening for other disorder: Secondary | ICD-10-CM | POA: Diagnosis not present

## 2015-11-11 ENCOUNTER — Other Ambulatory Visit: Payer: Self-pay | Admitting: *Deleted

## 2015-11-11 ENCOUNTER — Other Ambulatory Visit: Payer: Medicare Other

## 2015-11-11 ENCOUNTER — Telehealth: Payer: Self-pay | Admitting: Hematology

## 2015-11-11 ENCOUNTER — Encounter: Payer: Medicare Other | Admitting: Hematology

## 2015-11-11 ENCOUNTER — Encounter: Payer: Self-pay | Admitting: Hematology

## 2015-11-11 NOTE — Telephone Encounter (Signed)
pt cld to r/s pt appt-trans to MW per pt req to get phlebotomy r/s

## 2015-11-11 NOTE — Progress Notes (Signed)
This encounter was created in error - please disregard.

## 2015-11-18 ENCOUNTER — Telehealth: Payer: Self-pay | Admitting: Hematology

## 2015-11-18 ENCOUNTER — Encounter: Payer: Self-pay | Admitting: Hematology

## 2015-11-18 ENCOUNTER — Other Ambulatory Visit (HOSPITAL_BASED_OUTPATIENT_CLINIC_OR_DEPARTMENT_OTHER): Payer: Medicare Other

## 2015-11-18 ENCOUNTER — Telehealth: Payer: Self-pay | Admitting: *Deleted

## 2015-11-18 ENCOUNTER — Ambulatory Visit (HOSPITAL_BASED_OUTPATIENT_CLINIC_OR_DEPARTMENT_OTHER): Payer: Medicare Other | Admitting: Hematology

## 2015-11-18 VITALS — BP 129/57 | HR 85 | Temp 98.0°F | Resp 18 | Ht 71.0 in | Wt 182.6 lb

## 2015-11-18 DIAGNOSIS — D45 Polycythemia vera: Secondary | ICD-10-CM | POA: Diagnosis not present

## 2015-11-18 DIAGNOSIS — I824Y2 Acute embolism and thrombosis of unspecified deep veins of left proximal lower extremity: Secondary | ICD-10-CM

## 2015-11-18 DIAGNOSIS — D696 Thrombocytopenia, unspecified: Secondary | ICD-10-CM | POA: Diagnosis not present

## 2015-11-18 DIAGNOSIS — I824Y1 Acute embolism and thrombosis of unspecified deep veins of right proximal lower extremity: Secondary | ICD-10-CM | POA: Diagnosis not present

## 2015-11-18 DIAGNOSIS — I824Y3 Acute embolism and thrombosis of unspecified deep veins of proximal lower extremity, bilateral: Secondary | ICD-10-CM

## 2015-11-18 LAB — CBC WITH DIFFERENTIAL/PLATELET
BASO%: 1.5 % (ref 0.0–2.0)
BASOS ABS: 0.4 10*3/uL — AB (ref 0.0–0.1)
EOS ABS: 0.2 10*3/uL (ref 0.0–0.5)
EOS%: 0.7 % (ref 0.0–7.0)
HEMATOCRIT: 44.4 % (ref 38.4–49.9)
HGB: 13.5 g/dL (ref 13.0–17.1)
LYMPH%: 4.2 % — AB (ref 14.0–49.0)
MCH: 26.4 pg — ABNORMAL LOW (ref 27.2–33.4)
MCHC: 30.4 g/dL — AB (ref 32.0–36.0)
MCV: 86.7 fL (ref 79.3–98.0)
MONO#: 1 10*3/uL — AB (ref 0.1–0.9)
MONO%: 3.9 % (ref 0.0–14.0)
NEUT%: 89.7 % — ABNORMAL HIGH (ref 39.0–75.0)
NEUTROS ABS: 23.3 10*3/uL — AB (ref 1.5–6.5)
PLATELETS: 106 10*3/uL — AB (ref 140–400)
RBC: 5.12 10*6/uL (ref 4.20–5.82)
RDW: 16.8 % — ABNORMAL HIGH (ref 11.0–14.6)
WBC: 26 10*3/uL — AB (ref 4.0–10.3)
lymph#: 1.1 10*3/uL (ref 0.9–3.3)
nRBC: 0 % (ref 0–0)

## 2015-11-18 NOTE — Telephone Encounter (Signed)
per pof to sch pt appt-MW sch phleb-gave pt copy of avs °

## 2015-11-18 NOTE — Progress Notes (Signed)
[facility] OFFICE PROGRESS NOTE  Tivis Ringer, MD [place] [address]  DIAGNOSIS: Polycythemia vera ([place])  Deep vein thrombosis (DVT) of proximal vein of both lower extremities, unspecified chronicity ([facility])  Thrombocytopenia ([facility])   CURRENT TREATMENT:   1. Phlebotomy and hydroxyurea 500 mg daily (since 2011) .  2.hydrea decreased to 500mg  daily 4-5 days/week since 02/2015  3. Xalreto 20 mg started about mid-February 2015 and off after 6 month, restarted again in 02/2015 due to left LE DVT  INTERVAL HISTORY: KOVIN DEMMA 82 y.o. male with a history of Polycythemia vera dating back to 1997, JAK2 mutation positive is here for follow up.  He is doing moderately well overall. His wife is most wheelchair-bound, needs assistance for her daily living, he is her caregiver, sometimes feels quite stressed because of that. He has been very compliant with monthly And phlebotomy if needed, his last phlebotomy was 2 months ago. He tolerated phlebotomy well, does feel tired afterwards, but the recurrence on the next day. He has mild skin bruises on his forearms and hands, no other bleedings. No other new complaints.  MEDICAL HISTORY: Past Medical History  Diagnosis Date  . Polycythemia vera(238.4)   . Gout   . HTN (hypertension)   . Depression   . BPH (benign prostatic hypertrophy)   . Dyslipidemia   . Mild aortic stenosis   . DJD (degenerative joint disease)   . Bone marrow disease     INTERIM HISTORY: has Polycythemia vera ([place]); Aneurysm of unspecified site [facility]); DVT (deep venous thrombosis) ([place]); and Thrombocytopenia ([place]) on his problem list.    ALLERGIES:  is allergic to amoxicillin and colchicine.  MEDICATIONS: has a current medication list which includes the following prescription(s): acetaminophen, allopurinol, alprazolam, escitalopram, fish oil-omega-3 fatty acids, hydroxyurea, lisinopril, multivitamin, rivaroxaban, terazosin, and vitamin  e.  SURGICAL HISTORY:  Past Surgical History  Procedure Laterality Date  . Hemorroidectomy      REVIEW OF SYSTEMS:   Constitutional: Denies fevers, chills or abnormal weight loss Eyes: Denies blurriness of vision Ears, nose, mouth, throat, and face: Denies mucositis or sore throat Respiratory: Denies cough, dyspnea or wheezes Cardiovascular: Denies palpitation, chest discomfort or lower extremity swelling Gastrointestinal:  Denies nausea, heartburn or change in bowel habits Skin: Denies abnormal skin rashes Lymphatics: Denies new lymphadenopathy or easy bruising Neurological:Denies numbness, tingling or new weaknesses Behavioral/Psych: Mood is stable, no new changes  All other systems were reviewed with the patient and are negative.  PHYSICAL EXAMINATION: ECOG PERFORMANCE STATUS: 0 - Asymptomatic  Blood pressure 129/57, pulse 85, temperature 98 F (36.7 C), temperature source Oral, resp. rate 18, height 5\' 11"  (1.803 m), weight 182 lb 9.6 oz (82.827 kg), SpO2 97 %.  General: Easily ambulatory.  HEENT: Sclerae anicteric. Conjunctivae were pink. Pupils round and reactive bilaterally. Oral mucosa is moist without ulceration or thrush.  No occipital, submandibular, cervical, supraclavicular or axillar adenopathy.  Lungs: clear to auscultation without wheezes. No rales or rhonchi.  Heart: regular rate and rhythm. No gallop or rubs. 2/6 systolic murmur.  Abdomen: soft, non tender. No guarding or rebound tenderness. Bowel sounds are present. No palpable hepatosplenomegaly.  MSK: no focal spinal tenderness.  Extremities: No clubbing or cyanosis.No calf tenderness to palpitation, no peripheral edema. The patient had grossly intact strength in upper and lower extremities.  Skin exam showed small ecchymosis on right forearm and both hands, no petechiae or skin rashes Neuro: non-focal, alert and oriented to time, person and place,  appropriate affect   Labs:  CBC Latest Ref Rng 11/18/2015  10/13/2015 09/15/2015  WBC 4.0 - 10.3 10e3/uL 26.0(H) 21.9(H) 24.5(H)  Hemoglobin 13.0 - 17.1 g/dL 13.5 13.1 13.8  Hematocrit 38.4 - 49.9 % 44.4 44.1 45.9  Platelets 140 - 400 10e3/uL 106(L) 128(L) 116(L)   CMP Latest Ref Rng 10/13/2015 08/19/2015 03/25/2015  Glucose 70 - 140 mg/dl 91 80 83  BUN 7.0 - 26.0 mg/dL 30.9(H) 26.6(H) 20.7  Creatinine 0.7 - 1.3 mg/dL 1.3 1.3 1.3  Sodium 136 - 145 mEq/L 141 140 139  Potassium 3.5 - 5.1 mEq/L 4.6 4.4 4.6  CO2 22 - 29 mEq/L 30(H) 26 28  Calcium 8.4 - 10.4 mg/dL 8.9 8.8 9.1  Total Protein 6.4 - 8.3 g/dL 6.2(L) 5.9(L) -  Total Bilirubin 0.20 - 1.20 mg/dL 0.61 0.54 -  Alkaline Phos 40 - 150 U/L 67 81 -  AST 5 - 34 U/L 22 22 -  ALT 0 - 55 U/L 16 17 -    RADIOGRAPHIC STUDIES: No results found.  ASSESSMENT: [name] 82 y.o. male with a history of Polycythemia vera ([place])  Deep vein thrombosis (DVT) of proximal vein of both lower extremities, unspecified chronicity (H[facility])  Thrombocytopenia ([place])   PLAN:  1. Polycythemia vera diagnosed in 1997, JAK2 mutation positive.  -We reviewed the nature history of PV, most people do very well, some people would develop myelofibrosis or leukemia late on. The major complication is thrombosis. -He has been on Hydrea 500 mg daily for 4-5 years, due to the worsening thrombocytopenia, I decreased to 500 mg 4 days a week (M, W, F, Sat and Sun) -His thrombocytopenia has resolved, but his WBC has increased to 20K's since we decreased his Hydrea. H/H also slightly increased. So I increased his Hydrea to 500 mg daily except Monday and Friday. -His WBC has dropped some, HCT 45% toay, plt 106K - I recommend her to continue Hydrea 500 mg daily except Monday and Friday -He will continue checking CBC every month, and phlebotomy if hematocrit more than 45% -He is on Xarelto now.  2 RLE DVT, LLE DVT in 02/2015, likely secondary to #1 -I recommend him to continue Xarelto indefinitely, due to recurrent DVT. -He knows to be  careful about fall and injury  3.  Thrombocytopenia - secondary to Hydrea. - we'll continue monitoring  PLAN:  -continue Hydrea to 500 mg 5 days a week -CBC every 4 weeks and plembotomy if HCT>45% -Return to clinic in 4 months   All questions were answered. The patient knows to call the clinic with any problems, questions or concerns. We can certainly see the patient much sooner if necessary.  I spent 15 minutes counseling the patient face to face. The total time spent in the appointment was 25 minutes.    Truitt Merle, MD 11/18/2015   2:27 PM

## 2015-11-18 NOTE — Telephone Encounter (Signed)
Per staff message and POF I have scheduled appts. Advised scheduler of appts. JMW  

## 2015-11-18 NOTE — Telephone Encounter (Signed)
Gave pt appt & avs °

## 2015-12-01 DIAGNOSIS — M5137 Other intervertebral disc degeneration, lumbosacral region: Secondary | ICD-10-CM | POA: Diagnosis not present

## 2015-12-01 DIAGNOSIS — M419 Scoliosis, unspecified: Secondary | ICD-10-CM | POA: Diagnosis not present

## 2015-12-01 DIAGNOSIS — M169 Osteoarthritis of hip, unspecified: Secondary | ICD-10-CM | POA: Diagnosis not present

## 2015-12-01 DIAGNOSIS — M47817 Spondylosis without myelopathy or radiculopathy, lumbosacral region: Secondary | ICD-10-CM | POA: Diagnosis not present

## 2015-12-01 DIAGNOSIS — M47816 Spondylosis without myelopathy or radiculopathy, lumbar region: Secondary | ICD-10-CM | POA: Diagnosis not present

## 2015-12-16 ENCOUNTER — Other Ambulatory Visit (HOSPITAL_BASED_OUTPATIENT_CLINIC_OR_DEPARTMENT_OTHER): Payer: Medicare Other

## 2015-12-16 ENCOUNTER — Other Ambulatory Visit: Payer: Medicare Other

## 2015-12-16 ENCOUNTER — Ambulatory Visit (HOSPITAL_BASED_OUTPATIENT_CLINIC_OR_DEPARTMENT_OTHER): Payer: Medicare Other

## 2015-12-16 VITALS — BP 121/67 | HR 74 | Temp 97.8°F | Resp 17

## 2015-12-16 DIAGNOSIS — D45 Polycythemia vera: Secondary | ICD-10-CM

## 2015-12-16 LAB — CBC WITH DIFFERENTIAL/PLATELET
BASO%: 1.2 % (ref 0.0–2.0)
Basophils Absolute: 0.3 10*3/uL — ABNORMAL HIGH (ref 0.0–0.1)
EOS%: 0.8 % (ref 0.0–7.0)
Eosinophils Absolute: 0.2 10*3/uL (ref 0.0–0.5)
HCT: 45.2 % (ref 38.4–49.9)
HGB: 13.7 g/dL (ref 13.0–17.1)
LYMPH#: 1.5 10*3/uL (ref 0.9–3.3)
LYMPH%: 5.4 % — AB (ref 14.0–49.0)
MCH: 26.5 pg — AB (ref 27.2–33.4)
MCHC: 30.3 g/dL — AB (ref 32.0–36.0)
MCV: 87.4 fL (ref 79.3–98.0)
MONO#: 1.1 10*3/uL — ABNORMAL HIGH (ref 0.1–0.9)
MONO%: 3.9 % (ref 0.0–14.0)
NEUT%: 88.7 % — AB (ref 39.0–75.0)
NEUTROS ABS: 24.6 10*3/uL — AB (ref 1.5–6.5)
Platelets: 132 10*3/uL — ABNORMAL LOW (ref 140–400)
RBC: 5.17 10*6/uL (ref 4.20–5.82)
RDW: 16.8 % — ABNORMAL HIGH (ref 11.0–14.6)
WBC: 27.8 10*3/uL — AB (ref 4.0–10.3)
nRBC: 0 % (ref 0–0)

## 2015-12-16 NOTE — Progress Notes (Signed)
Phlebotomy performed using left AC.  500grams removed during 1431-1437.  Pt tolerated well and is eating at drinking at this time.  Will  Continue to monitor for remainder of 78minutes post procedure.

## 2015-12-16 NOTE — Patient Instructions (Signed)
Therapeutic Phlebotomy, Care After  Refer to this sheet in the next few weeks. These instructions provide you with information about caring for yourself after your procedure. Your health care provider may also give you more specific instructions. Your treatment has been planned according to current medical practices, but problems sometimes occur. Call your health care provider if you have any problems or questions after your procedure.  WHAT TO EXPECT AFTER THE PROCEDURE  After your procedure, it is common to have:   Light-headedness or dizziness. You may feel faint.   Nausea.   Tiredness.  HOME CARE INSTRUCTIONS  Activities   Return to your normal activities as directed by your health care provider. Most people can go back to their normal activities right away.   Avoid strenuous physical activity and heavy lifting or pulling for about 5 hours after the procedure. Do not lift anything that is heavier than 10 lb (4.5 kg).   Athletes should avoid strenuous exercise for at least 12 hours.   Change positions slowly for the remainder of the day. This will help to prevent light-headedness or fainting.   If you feel light-headed, lie down until the feeling goes away.  Eating and Drinking   Be sure to eat well-balanced meals for the next 24 hours.   Drink enough fluid to keep your urine clear or pale yellow.   Avoid drinking alcohol on the day that you had the procedure.  Care of the Needle Insertion Site   Keep your bandage dry. You can remove the bandage after about 5 hours or as directed by your health care provider.   If you have bleeding from the needle insertion site, elevate your arm and press firmly on the site until the bleeding stops.   If you have bruising at the site, apply ice to the area:   Put ice in a plastic bag.   Place a towel between your skin and the bag.   Leave the ice on for 20 minutes, 2-3 times a day for the first 24 hours.   If the swelling does not go away after 24 hours, apply  a warm, moist washcloth to the area for 20 minutes, 2-3 times a day.  General Instructions   Avoid smoking for at least 30 minutes after the procedure.   Keep all follow-up visits as directed by your health care provider. It is important to continue with further therapeutic phlebotomy treatments as directed.  SEEK MEDICAL CARE IF:   You have redness, swelling, or pain at the needle insertion site.   You have fluid, blood, or pus coming from the needle insertion site.   You feel light-headed, dizzy, or nauseated, and the feeling does not go away.   You notice new bruising at the needle insertion site.   You feel weaker than normal.   You have a fever or chills.  SEEK IMMEDIATE MEDICAL CARE IF:   You have severe nausea or vomiting.   You have chest pain.   You have trouble breathing.    This information is not intended to replace advice given to you by your health care provider. Make sure you discuss any questions you have with your health care provider.    Document Released: 12/05/2010 Document Revised: 11/17/2014 Document Reviewed: 06/29/2014  Elsevier Interactive Patient Education 2016 [employer]

## 2016-01-11 DIAGNOSIS — R42 Dizziness and giddiness: Secondary | ICD-10-CM | POA: Diagnosis not present

## 2016-01-11 DIAGNOSIS — I10 Essential (primary) hypertension: Secondary | ICD-10-CM | POA: Diagnosis not present

## 2016-01-11 DIAGNOSIS — R2689 Other abnormalities of gait and mobility: Secondary | ICD-10-CM | POA: Diagnosis not present

## 2016-01-11 DIAGNOSIS — Z6826 Body mass index (BMI) 26.0-26.9, adult: Secondary | ICD-10-CM | POA: Diagnosis not present

## 2016-01-13 ENCOUNTER — Other Ambulatory Visit (HOSPITAL_BASED_OUTPATIENT_CLINIC_OR_DEPARTMENT_OTHER): Payer: Medicare Other

## 2016-01-13 ENCOUNTER — Other Ambulatory Visit: Payer: Medicare Other

## 2016-01-13 ENCOUNTER — Ambulatory Visit: Payer: Medicare Other

## 2016-01-13 DIAGNOSIS — D45 Polycythemia vera: Secondary | ICD-10-CM

## 2016-01-13 LAB — CBC WITH DIFFERENTIAL/PLATELET
BASO%: 1.3 % (ref 0.0–2.0)
BASOS ABS: 0.3 10*3/uL — AB (ref 0.0–0.1)
EOS ABS: 0.3 10*3/uL (ref 0.0–0.5)
EOS%: 1.1 % (ref 0.0–7.0)
HEMATOCRIT: 43.1 % (ref 38.4–49.9)
HGB: 12.9 g/dL — ABNORMAL LOW (ref 13.0–17.1)
LYMPH#: 1.1 10*3/uL (ref 0.9–3.3)
LYMPH%: 4.6 % — AB (ref 14.0–49.0)
MCH: 25.6 pg — ABNORMAL LOW (ref 27.2–33.4)
MCHC: 29.9 g/dL — AB (ref 32.0–36.0)
MCV: 85.6 fL (ref 79.3–98.0)
MONO#: 0.7 10*3/uL (ref 0.1–0.9)
MONO%: 2.8 % (ref 0.0–14.0)
NEUT#: 22.1 10*3/uL — ABNORMAL HIGH (ref 1.5–6.5)
NEUT%: 90.2 % — AB (ref 39.0–75.0)
PLATELETS: 124 10*3/uL — AB (ref 140–400)
RBC: 5.04 10*6/uL (ref 4.20–5.82)
RDW: 16.9 % — ABNORMAL HIGH (ref 11.0–14.6)
WBC: 24.5 10*3/uL — ABNORMAL HIGH (ref 4.0–10.3)

## 2016-01-13 LAB — COMPREHENSIVE METABOLIC PANEL
ALT: 15 U/L (ref 0–55)
ANION GAP: 8 meq/L (ref 3–11)
AST: 20 U/L (ref 5–34)
Albumin: 3.7 g/dL (ref 3.5–5.0)
Alkaline Phosphatase: 81 U/L (ref 40–150)
BILIRUBIN TOTAL: 0.48 mg/dL (ref 0.20–1.20)
BUN: 26.6 mg/dL — ABNORMAL HIGH (ref 7.0–26.0)
CALCIUM: 8.9 mg/dL (ref 8.4–10.4)
CHLORIDE: 103 meq/L (ref 98–109)
CO2: 27 mEq/L (ref 22–29)
CREATININE: 1.3 mg/dL (ref 0.7–1.3)
EGFR: 50 mL/min/{1.73_m2} — AB (ref >90)
Glucose: 126 mg/dl (ref 70–140)
Potassium: 4.9 mEq/L (ref 3.5–5.1)
Sodium: 138 mEq/L (ref 136–145)
Total Protein: 6 g/dL — ABNORMAL LOW (ref 6.4–8.3)

## 2016-01-13 NOTE — Progress Notes (Signed)
Pt HCT level today 43%. Per Dr. Burr Medico phlebotomy parameters of treatment greater than 45%, pt will not require any treatment today. Pt states " I feel great and have no problems right now". Printed lab work for pt today. Pt aware of follow up appts in the future.

## 2016-02-10 ENCOUNTER — Ambulatory Visit: Payer: Medicare Other

## 2016-02-10 ENCOUNTER — Other Ambulatory Visit (HOSPITAL_BASED_OUTPATIENT_CLINIC_OR_DEPARTMENT_OTHER): Payer: Medicare Other

## 2016-02-10 ENCOUNTER — Other Ambulatory Visit: Payer: Medicare Other

## 2016-02-10 DIAGNOSIS — D45 Polycythemia vera: Secondary | ICD-10-CM

## 2016-02-10 LAB — CBC WITH DIFFERENTIAL/PLATELET
BASO%: 1.3 % (ref 0.0–2.0)
Basophils Absolute: 0.3 10*3/uL — ABNORMAL HIGH (ref 0.0–0.1)
EOS ABS: 0.2 10*3/uL (ref 0.0–0.5)
EOS%: 0.8 % (ref 0.0–7.0)
HEMATOCRIT: 43.3 % (ref 38.4–49.9)
HEMOGLOBIN: 13.3 g/dL (ref 13.0–17.1)
LYMPH%: 4.5 % — ABNORMAL LOW (ref 14.0–49.0)
MCH: 26.7 pg — AB (ref 27.2–33.4)
MCHC: 30.7 g/dL — AB (ref 32.0–36.0)
MCV: 86.9 fL (ref 79.3–98.0)
MONO#: 1.2 10*3/uL — ABNORMAL HIGH (ref 0.1–0.9)
MONO%: 4.3 % (ref 0.0–14.0)
NEUT%: 89.1 % — ABNORMAL HIGH (ref 39.0–75.0)
NEUTROS ABS: 24.2 10*3/uL — AB (ref 1.5–6.5)
NRBC: 0 % (ref 0–0)
Platelets: 131 10*3/uL — ABNORMAL LOW (ref 140–400)
RBC: 4.98 10*6/uL (ref 4.20–5.82)
RDW: 16.3 % — AB (ref 11.0–14.6)
WBC: 27.2 10*3/uL — AB (ref 4.0–10.3)
lymph#: 1.2 10*3/uL (ref 0.9–3.3)

## 2016-02-10 NOTE — Progress Notes (Signed)
Spoke with pt in lobby. HCT 43% does not meet parameters for phlebotomy. Pt was pleased to hear this. He continues Hydrea as prescribed, reports he is tolerating it well. Pt will follow up as scheduled next month.

## 2016-02-14 DIAGNOSIS — L0889 Other specified local infections of the skin and subcutaneous tissue: Secondary | ICD-10-CM | POA: Diagnosis not present

## 2016-02-14 DIAGNOSIS — Z6826 Body mass index (BMI) 26.0-26.9, adult: Secondary | ICD-10-CM | POA: Diagnosis not present

## 2016-02-18 DIAGNOSIS — I10 Essential (primary) hypertension: Secondary | ICD-10-CM | POA: Diagnosis not present

## 2016-02-18 DIAGNOSIS — R600 Localized edema: Secondary | ICD-10-CM | POA: Diagnosis not present

## 2016-02-18 DIAGNOSIS — I87319 Chronic venous hypertension (idiopathic) with ulcer of unspecified lower extremity: Secondary | ICD-10-CM | POA: Diagnosis not present

## 2016-02-18 DIAGNOSIS — Z6826 Body mass index (BMI) 26.0-26.9, adult: Secondary | ICD-10-CM | POA: Diagnosis not present

## 2016-02-22 DIAGNOSIS — R600 Localized edema: Secondary | ICD-10-CM | POA: Diagnosis not present

## 2016-02-22 DIAGNOSIS — I87319 Chronic venous hypertension (idiopathic) with ulcer of unspecified lower extremity: Secondary | ICD-10-CM | POA: Diagnosis not present

## 2016-02-22 DIAGNOSIS — Z6826 Body mass index (BMI) 26.0-26.9, adult: Secondary | ICD-10-CM | POA: Diagnosis not present

## 2016-02-25 DIAGNOSIS — I87319 Chronic venous hypertension (idiopathic) with ulcer of unspecified lower extremity: Secondary | ICD-10-CM | POA: Diagnosis not present

## 2016-02-25 DIAGNOSIS — Z6826 Body mass index (BMI) 26.0-26.9, adult: Secondary | ICD-10-CM | POA: Diagnosis not present

## 2016-03-02 DIAGNOSIS — I87319 Chronic venous hypertension (idiopathic) with ulcer of unspecified lower extremity: Secondary | ICD-10-CM | POA: Diagnosis not present

## 2016-03-09 ENCOUNTER — Telehealth: Payer: Self-pay | Admitting: Hematology

## 2016-03-09 ENCOUNTER — Other Ambulatory Visit (HOSPITAL_BASED_OUTPATIENT_CLINIC_OR_DEPARTMENT_OTHER): Payer: Medicare Other

## 2016-03-09 ENCOUNTER — Ambulatory Visit (HOSPITAL_BASED_OUTPATIENT_CLINIC_OR_DEPARTMENT_OTHER): Payer: Medicare Other | Admitting: Hematology

## 2016-03-09 ENCOUNTER — Encounter: Payer: Medicare Other | Attending: Surgery | Admitting: Surgery

## 2016-03-09 ENCOUNTER — Encounter: Payer: Self-pay | Admitting: Hematology

## 2016-03-09 VITALS — BP 129/53 | HR 86 | Temp 98.4°F | Resp 18 | Ht 71.0 in | Wt 187.5 lb

## 2016-03-09 DIAGNOSIS — S8990XA Unspecified injury of unspecified lower leg, initial encounter: Secondary | ICD-10-CM

## 2016-03-09 DIAGNOSIS — M109 Gout, unspecified: Secondary | ICD-10-CM | POA: Diagnosis not present

## 2016-03-09 DIAGNOSIS — E785 Hyperlipidemia, unspecified: Secondary | ICD-10-CM | POA: Diagnosis not present

## 2016-03-09 DIAGNOSIS — Z86718 Personal history of other venous thrombosis and embolism: Secondary | ICD-10-CM | POA: Insufficient documentation

## 2016-03-09 DIAGNOSIS — D696 Thrombocytopenia, unspecified: Secondary | ICD-10-CM

## 2016-03-09 DIAGNOSIS — M1990 Unspecified osteoarthritis, unspecified site: Secondary | ICD-10-CM | POA: Insufficient documentation

## 2016-03-09 DIAGNOSIS — N400 Enlarged prostate without lower urinary tract symptoms: Secondary | ICD-10-CM | POA: Diagnosis not present

## 2016-03-09 DIAGNOSIS — Z880 Allergy status to penicillin: Secondary | ICD-10-CM | POA: Diagnosis not present

## 2016-03-09 DIAGNOSIS — I87032 Postthrombotic syndrome with ulcer and inflammation of left lower extremity: Secondary | ICD-10-CM | POA: Insufficient documentation

## 2016-03-09 DIAGNOSIS — I10 Essential (primary) hypertension: Secondary | ICD-10-CM | POA: Insufficient documentation

## 2016-03-09 DIAGNOSIS — L97321 Non-pressure chronic ulcer of left ankle limited to breakdown of skin: Secondary | ICD-10-CM | POA: Diagnosis not present

## 2016-03-09 DIAGNOSIS — L97322 Non-pressure chronic ulcer of left ankle with fat layer exposed: Secondary | ICD-10-CM | POA: Insufficient documentation

## 2016-03-09 DIAGNOSIS — I824Y3 Acute embolism and thrombosis of unspecified deep veins of proximal lower extremity, bilateral: Secondary | ICD-10-CM | POA: Diagnosis not present

## 2016-03-09 DIAGNOSIS — D45 Polycythemia vera: Secondary | ICD-10-CM | POA: Diagnosis not present

## 2016-03-09 LAB — CBC WITH DIFFERENTIAL/PLATELET
BASO%: 1 % (ref 0.0–2.0)
Basophils Absolute: 0.3 10*3/uL — ABNORMAL HIGH (ref 0.0–0.1)
EOS ABS: 0.2 10*3/uL (ref 0.0–0.5)
EOS%: 0.8 % (ref 0.0–7.0)
HEMATOCRIT: 45.1 % (ref 38.4–49.9)
HGB: 14 g/dL (ref 13.0–17.1)
LYMPH%: 3.9 % — AB (ref 14.0–49.0)
MCH: 27.3 pg (ref 27.2–33.4)
MCHC: 31 g/dL — AB (ref 32.0–36.0)
MCV: 87.9 fL (ref 79.3–98.0)
MONO#: 1.2 10*3/uL — AB (ref 0.1–0.9)
MONO%: 4.2 % (ref 0.0–14.0)
NEUT%: 90.1 % — ABNORMAL HIGH (ref 39.0–75.0)
NEUTROS ABS: 25.2 10*3/uL — AB (ref 1.5–6.5)
NRBC: 0 % (ref 0–0)
PLATELETS: 145 10*3/uL (ref 140–400)
RBC: 5.13 10*6/uL (ref 4.20–5.82)
RDW: 16.8 % — ABNORMAL HIGH (ref 11.0–14.6)
WBC: 27.9 10*3/uL — AB (ref 4.0–10.3)
lymph#: 1.1 10*3/uL (ref 0.9–3.3)

## 2016-03-09 NOTE — Progress Notes (Signed)
[facility] OFFICE PROGRESS NOTE  Tivis Ringer, MD [place] [address]  DIAGNOSIS: Polycythemia vera ([place])  Deep vein thrombosis (DVT) of proximal vein of both lower extremities, unspecified chronicity (HCC)  Thrombocytopenia (HCC)   CURRENT TREATMENT:   1. Phlebotomy and hydroxyurea 500 mg daily (since 2011) .  2.hydrea decreased to 500mg  daily 4-5 days/week since 02/2015  3. Xalreto 20 mg started about mid-February 2015 and off after 6 month, restarted again in 02/2015 due to left LE DVT  INTERVAL HISTORY: [name] [name] 82 y.o. male with a history of Polycythemia vera dating back to 1997, JAK2 mutation positive is here for follow up.  He has developed a small wound at his visit in the inner side of his left ankle about 3-4 weeks ago, probably after a mild injury. He went to see his primary care physician, however his wound has not healed. He was referred to this wound clinic last week, has been changing dressing at the wound clinic. He has mild to moderate pain at the wound site, no other complaints. He has been very compliant with Hydrea, takes daily for 5 days a week, tolerating well. His wife was recently hospitalized, currently in a nursing home, and will likely come back home soon.   MEDICAL HISTORY: Past Medical History:  Diagnosis Date  . Bone marrow disease   . BPH (benign prostatic hypertrophy)   . Depression   . DJD (degenerative joint disease)   . Dyslipidemia   . Gout   . HTN (hypertension)   . Mild aortic stenosis   . Polycythemia vera(238.4)     INTERIM HISTORY: has Polycythemia vera ([place]); Aneurysm of unspecified site [facility]); DVT (deep venous thrombosis) ([place]); and Thrombocytopenia ([place]) on his problem list.    ALLERGIES:  is allergic to amoxicillin and colchicine.  MEDICATIONS: has a current medication list which includes the following prescription(s): acetaminophen, allopurinol, alprazolam, escitalopram, fish oil-omega-3  fatty acids, hydroxyurea, lisinopril, multivitamin, rivaroxaban, terazosin, and vitamin e.  SURGICAL HISTORY:  Past Surgical History:  Procedure Laterality Date  . HEMORROIDECTOMY      REVIEW OF SYSTEMS:   Constitutional: Denies fevers, chills or abnormal weight loss Eyes: Denies blurriness of vision Ears, nose, mouth, throat, and face: Denies mucositis or sore throat Respiratory: Denies cough, dyspnea or wheezes Cardiovascular: Denies palpitation, chest discomfort or lower extremity swelling Gastrointestinal:  Denies nausea, heartburn or change in bowel habits Skin: Denies abnormal skin rashes Lymphatics: Denies new lymphadenopathy or easy bruising Neurological:Denies numbness, tingling or new weaknesses Behavioral/Psych: Mood is stable, no new changes  All other systems were reviewed with the patient and are negative.  PHYSICAL EXAMINATION: ECOG PERFORMANCE STATUS: 1  Blood pressure (!) 129/53, pulse 86, temperature 98.4 F (36.9 C), temperature source Oral, resp. rate 18, height 5\' 11"  (1.803 m), weight 187 lb 8 oz (85 kg), SpO2 93 %.  General: Easily ambulatory.  HEENT: Sclerae anicteric. Conjunctivae were pink. Pupils round and reactive bilaterally. Oral mucosa is moist without ulceration or thrush.  No occipital, submandibular, cervical, supraclavicular or axillar adenopathy.  Lungs: clear to auscultation without wheezes. No rales or rhonchi.  Heart: regular rate and rhythm. No gallop or rubs. 2/6 systolic murmur.  Abdomen: soft, non tender. No guarding or rebound tenderness. Bowel sounds are present. No palpable hepatosplenomegaly.  MSK: no focal spinal tenderness.  Extremities: No clubbing or cyanosis.No calf tenderness to palpitation, no peripheral edema. The patient had grossly intact strength in upper and lower extremities.  Skin  exam showed small ecchymosis on right forearm and both hands, no petechiae or skin rashes, His left lower extremity and ankle is wrapped due to  the wound Neuro: non-focal, alert and oriented to time, person and place, appropriate affect   Labs:  CBC Latest Ref Rng & Units 03/09/2016 02/10/2016 01/13/2016  WBC 4.0 - 10.3 10e3/uL 27.9(H) 27.2(H) 24.5(H)  Hemoglobin 13.0 - 17.1 g/dL 14.0 13.3 12.9(L)  Hematocrit 38.4 - 49.9 % 45.1 43.3 43.1  Platelets 140 - 400 10e3/uL 145 131(L) 124(L)   CMP Latest Ref Rng & Units 01/13/2016 10/13/2015 08/19/2015  Glucose 70 - 140 mg/dl 126 91 80  BUN 7.0 - 26.0 mg/dL 26.6(H) 30.9(H) 26.6(H)  Creatinine 0.7 - 1.3 mg/dL 1.3 1.3 1.3  Sodium 136 - 145 mEq/L 138 141 140  Potassium 3.5 - 5.1 mEq/L 4.9 4.6 4.4  Chloride 98 - 107 mEq/L - - -  CO2 22 - 29 mEq/L 27 30(H) 26  Calcium 8.4 - 10.4 mg/dL 8.9 8.9 8.8  Total Protein 6.4 - 8.3 g/dL 6.0(L) 6.2(L) 5.9(L)  Total Bilirubin 0.20 - 1.20 mg/dL 0.48 0.61 0.54  Alkaline Phos 40 - 150 U/L 81 67 81  AST 5 - 34 U/L 20 22 22   ALT 0 - 55 U/L 15 16 17     RADIOGRAPHIC STUDIES: No results found.  ASSESSMENT: [name] [name] 82 y.o. male with a history of Polycythemia vera ([place])  Deep vein thrombosis (DVT) of proximal vein of both lower extremities, unspecified chronicity (HCC)  Thrombocytopenia ([place])   PLAN:  1. Polycythemia vera diagnosed in 1997, JAK2 mutation positive.  -We reviewed the nature history of PV, most people do very well, some people would develop myelofibrosis or leukemia late on. The major complication is thrombosis. -He has been on Hydrea 500 mg daily for 4-5 years, due to the worsening thrombocytopenia, I decreased to 500 mg 4 days a week (M, W, F, Sat and Sun) -His thrombocytopenia has resolved, but his WBC has increased to 20K's since we decreased his Hydrea. H/H also slightly increased. So I increased his Hydrea to 500 mg daily except Monday and Friday. -Lab reviewed, hematocrit 45.1%, note from phlebotomy today. -We discussed Hydrea may slow down his wound healing at his ankle. If the wound has not healed in 2-3 weeks, I recommend him  to hold hydrea, and do more frequent lab phlebotomy is neededand  -he will continue Hydrea 500 mg daily except Monday and Friday for now  -He will continue checking CBC every month, and phlebotomy if hematocrit more than 45% -He is on Xarelto now.  2 RLE DVT, LLE DVT in 02/2015, likely secondary to #1 -I recommend him to continue Xarelto indefinitely, due to recurrent DVT. -He knows to be careful about fall and injury  3. Left ankle wound -He will continue follow-up with wound clinic  -If his wound has not healed in 2-3 weeks, I recommend him to stop Hydrea, and change lab to every 2 weeks with phlebotomy as needed   PLAN:  -continue Hydrea to 500 mg 5 days a week -CBC every 4 weeks and plembotomy if HCT>45% -Return to clinic in 2 months  --If his wound has not healed in 2-3 weeks, I recommend him to stop Hydrea, and change lab to every 2 weeks with phlebotomy as needed   All questions were answered. The patient knows to call the clinic with any problems, questions or concerns. We can certainly see the patient much sooner if necessary.  I spent  15 minutes counseling the patient face to face. The total time spent in the appointment was 25 minutes.    Truitt Merle, MD 03/09/2016   1:56 PM

## 2016-03-09 NOTE — Telephone Encounter (Signed)
Gave patient avs report and appointments for September and October  °

## 2016-03-10 NOTE — Progress Notes (Signed)
EDMAR, IGARASHI ([id number]) Visit Report for 03/09/2016 Allergy List Details Patient Name: [name] [name], [name] Date of Service: 03/09/2016 9:30 AM Medical Record Number: [id number] Patient Account Number: [id number] Date of Birth/Sex: 04/28/1924 (82 y.o. Male) Treating RN: Ahmed Prima Primary Care Physician: [name] Other Clinician: Referring Physician: Toy Baker Treating Physician/Extender: Frann Rider in Treatment: 0 Allergies Active Allergies penicillin Severity: Severe colchicine Severity: Severe Allergy Notes Electronic Signature(s) Signed: 03/09/2016 5:32:44 PM By: Alric Quan Entered By: Alric Quan on 03/09/2016 10:06:30 [name] [name], [name] [name] ([id number]) -------------------------------------------------------------------------------- Arrival Information Details Patient Name: [name] [name], [name] Date of Service: 03/09/2016 9:30 AM Medical Record Number: [id number] Patient Account Number: [id number] Date of Birth/Sex: Oct 18, 1923 (82 y.o. Male) Treating RN: Ahmed Prima Primary Care Physician: [name] Other Clinician: Referring Physician: Toy Baker Treating Physician/Extender: Frann Rider in Treatment: 0 Visit Information Patient Arrived: Ambulatory Arrival Time: 09:51 Accompanied By: self Transfer Assistance: None Patient Identification Verified: Yes Secondary Verification Process Yes Completed: Patient Requires Transmission- No Based Precautions: Patient Has Alerts: Yes Patient Alerts: Patient on Blood Thinner Xarelto Electronic Signature(s) Signed: 03/09/2016 5:32:44 PM By: Alric Quan Entered By: Alric Quan on 03/09/2016 09:52:07 [name] [name], [name] [name] ([id number]) -------------------------------------------------------------------------------- Clinic Level of Care Assessment Details Patient Name: [name] [name], [name] Date of Service: 03/09/2016 9:30 AM Medical Record Number:  [id number] Patient Account Number: [id number] Date of Birth/Sex: 04/16/1924 (82 y.o. Male) Treating RN: Baruch Gouty, RN, BSN, Velva Harman Primary Care Physician: [name] Other Clinician: Referring Physician: Toy Baker Treating Physician/Extender: Frann Rider in Treatment: 0 Clinic Level of Care Assessment Items TOOL 2 Quantity Score []  - Use when only an EandM is performed on the INITIAL visit 0 ASSESSMENTS - Nursing Assessment / Reassessment X - General Physical Exam (combine w/ comprehensive assessment (listed just 1 20 below) when performed on new pt. evals) X - Comprehensive Assessment (HX, ROS, Risk Assessments, Wounds Hx, etc.) 1 25 ASSESSMENTS - Wound and Skin Assessment / Reassessment X - Simple Wound Assessment / Reassessment - one wound 1 5 []  - Complex Wound Assessment / Reassessment - multiple wounds 0 []  - Dermatologic / Skin Assessment (not related to wound area) 0 ASSESSMENTS - Ostomy and/or Continence Assessment and Care []  - Incontinence Assessment and Management 0 []  - Ostomy Care Assessment and Management (repouching, etc.) 0 PROCESS - Coordination of Care X - Simple Patient / Family Education for ongoing care 1 15 []  - Complex (extensive) Patient / Family Education for ongoing care 0 X - Staff obtains Programmer, systems, Records, Test Results / Process Orders 1 10 []  - Staff telephones HHA, Nursing Homes / Clarify orders / etc 0 []  - Routine Transfer to another Facility (non-emergent condition) 0 []  - Routine Hospital Admission (non-emergent condition) 0 X - New Admissions / Biomedical engineer / Ordering NPWT, Apligraf, etc. 1 15 []  - Emergency Hospital Admission (emergent condition) 0 []  - Simple Discharge Coordination 0 [name] [name], [name] ([id number]) []  - Complex (extensive) Discharge Coordination 0 PROCESS - Special Needs []  - Pediatric / Minor Patient Management 0 []  - Isolation Patient Management 0 []  - Hearing / Language / Visual special needs  0 []  - Assessment of Community assistance (transportation, D/C planning, etc.) 0 []  - Additional assistance / Altered mentation 0 []  - Support Surface(s) Assessment (bed, cushion, seat, etc.) 0 INTERVENTIONS - Wound Cleansing / Measurement X - Wound Imaging (photographs - any number of wounds) 1 5 []  - Wound Tracing (instead of photographs) 0 X - Simple Wound Measurement - one wound 1 5 []  -  Complex Wound Measurement - multiple wounds 0 X - Simple Wound Cleansing - one wound 1 5 []  - Complex Wound Cleansing - multiple wounds 0 INTERVENTIONS - Wound Dressings X - Small Wound Dressing one or multiple wounds 1 10 []  - Medium Wound Dressing one or multiple wounds 0 []  - Large Wound Dressing one or multiple wounds 0 []  - Application of Medications - injection 0 INTERVENTIONS - Miscellaneous []  - External ear exam 0 []  - Specimen Collection (cultures, biopsies, blood, body fluids, etc.) 0 []  - Specimen(s) / Culture(s) sent or taken to Lab for analysis 0 []  - Patient Transfer (multiple staff / Civil Service fast streamer / Similar devices) 0 []  - Simple Staple / Suture removal (25 or less) 0 []  - Complex Staple / Suture removal (26 or more) 0 [name] [name], [name] ([id number]) []  - Hypo / Hyperglycemic Management (close monitor of Blood Glucose) 0 X - Ankle / Brachial Index (ABI) - do not check if billed separately 1 15 Has the patient been seen at the hospital within the last three years: Yes Total Score: 130 Level Of Care: New/Established - Level 4 Electronic Signature(s) Signed: 03/09/2016 5:25:53 PM By: Regan Lemming BSN, RN Entered By: Regan Lemming on 03/09/2016 10:55:08 [name] [name] ([id number]) -------------------------------------------------------------------------------- Encounter Discharge Information Details Patient Name: [name] [name], [name] Date of Service: 03/09/2016 9:30 AM Medical Record Number: [id number] Patient Account Number: [id number] Date of Birth/Sex: 06/22/1924 (82 y.o. Male) Treating  RN: Ahmed Prima Primary Care Physician: [name] Other Clinician: Referring Physician: Toy Baker Treating Physician/Extender: Frann Rider in Treatment: 0 Encounter Discharge Information Items Discharge Pain Level: 0 Discharge Condition: Stable Ambulatory Status: Ambulatory Discharge Destination: Home Transportation: Private Auto Accompanied By: self Schedule Follow-up Appointment: Yes Medication Reconciliation completed and provided to Patient/Care No Kynslie Ringle: Provided on Clinical Summary of Care: 03/09/2016 Form Type Recipient Paper Patient RS Electronic Signature(s) Signed: 03/09/2016 11:08:53 AM By: Ruthine Dose Entered By: Ruthine Dose on 03/09/2016 11:08:53 [name] [name], [name] [name] ([id number]) -------------------------------------------------------------------------------- Lower Extremity Assessment Details Patient Name: [name] [name], Anuel L. Date of Service: 03/09/2016 9:30 AM Medical Record Number: [id number] Patient Account Number: [id number] Date of Birth/Sex: 08/21/1923 (82 y.o. Male) Treating RN: Ahmed Prima Primary Care Physician: [name] Other Clinician: Referring Physician: Toy Baker Treating Physician/Extender: Frann Rider in Treatment: 0 Edema Assessment Assessed: [Left: No] [Right: No] E[Left: dema] [Right: :] Calf Left: Right: Point of Measurement: 34 cm From Medial Instep 35.5 cm 36.5 cm Ankle Left: Right: Point of Measurement: 10 cm From Medial Instep 24 cm 24.3 cm Vascular Assessment Pulses: Posterior Tibial Dorsalis Pedis Palpable: [Left:Yes] [Right:Yes] Doppler: [Left:Monophasic] [Right:Monophasic] Extremity colors, hair growth, and conditions: Extremity Color: [Left:Red] [Right:Hyperpigmented] Temperature of Extremity: [Left:Warm] [Right:Warm] Capillary Refill: [Left:< 3 seconds] [Right:< 3 seconds] Blood Pressure: Brachial: [Left:120] Dorsalis Pedis: 140 [Left:Dorsalis  Pedis:] Ankle: Posterior Tibial: [Left:Posterior Tibial: 1.17] Toe Nail Assessment Left: Right: Thick: No No Discolored: No No Deformed: No No Improper Length and Hygiene: Yes Yes Electronic Signature(s) Signed: 03/09/2016 5:32:44 PM By: Kate Sable, [name] [name] ([id number]) Entered By: Alric Quan on 03/09/2016 10:22:10 [name] [name], [name] [name] ([id number]) -------------------------------------------------------------------------------- Multi Wound Chart Details Patient Name: [name] [name], [name] Date of Service: 03/09/2016 9:30 AM Medical Record Number: [id number] Patient Account Number: [id number] Date of Birth/Sex: February 01, 1924 (82 y.o. Male) Treating RN: Baruch Gouty, RN, BSN, Velva Harman Primary Care Physician: [name] Other Clinician: Referring Physician: Toy Baker Treating Physician/Extender: Frann Rider in Treatment: 0 Vital Signs Height(in): 71 Pulse(bpm): 72 Weight(lbs): 185 Blood Pressure 131/65 (mmHg): Body Mass  Index(BMI): 26 Temperature(F): 98.1 Respiratory Rate 20 (breaths/min): Photos: [1:No Photos] [N/A:N/A] Wound Location: [1:Left Malleolus - Medial] [N/A:N/A] Wounding Event: [1:Gradually Appeared] [N/A:N/A] Primary Etiology: [1:Venous Leg Ulcer] [N/A:N/A] Comorbid History: [1:Cataracts, Hypertension, Gout, Osteoarthritis] [N/A:N/A] Date Acquired: [1:02/07/2016] [N/A:N/A] Weeks of Treatment: [1:0] [N/A:N/A] Wound Status: [1:Open] [N/A:N/A] Measurements L x W x D 1.1x1.2x0.2 [N/A:N/A] (cm) Area (cm) : [1:1.037] [N/A:N/A] Volume (cm) : [1:0.207] [N/A:N/A] Classification: [1:Partial Thickness] [N/A:N/A] Exudate Amount: [1:Large] [N/A:N/A] Exudate Type: [1:Serosanguineous] [N/A:N/A] Exudate Color: [1:red, brown] [N/A:N/A] Wound Margin: [1:Distinct, outline attached] [N/A:N/A] Granulation Amount: [1:Medium (34-66%)] [N/A:N/A] Granulation Quality: [1:Pink] [N/A:N/A] Necrotic Amount: [1:Medium (34-66%)] [N/A:N/A] Exposed  Structures: [1:Fascia: No Fat: No Tendon: No Muscle: No Joint: No Bone: No Limited to Skin Breakdown] [N/A:N/A] Epithelialization: [1:None] [N/A:N/A] Periwound Skin Texture: No Abnormalities Noted N/A N/A Periwound Skin Moist: Yes N/A N/A Moisture: Periwound Skin Color: Erythema: Yes N/A N/A Erythema Location: Circumferential N/A N/A Temperature: No Abnormality N/A N/A Tenderness on Yes N/A N/A Palpation: Wound Preparation: Ulcer Cleansing: Other: N/A N/A soap and water Topical Anesthetic Applied: Other: lidocaine 4% Treatment Notes Electronic Signature(s) Signed: 03/09/2016 5:25:53 PM By: Regan Lemming BSN, RN Entered By: Regan Lemming on 03/09/2016 10:50:48 [name] [name], [name] [name] ([id number]) -------------------------------------------------------------------------------- [place] Details Patient Name: [name] [name], Christia L. Date of Service: 03/09/2016 9:30 AM Medical Record Number: [id number] Patient Account Number: [id number] Date of Birth/Sex: 12/20/1923 (82 y.o. Male) Treating RN: Baruch Gouty, RN, BSN, Velva Harman Primary Care Physician: [name] Other Clinician: Referring Physician: Toy Baker Treating Physician/Extender: Frann Rider in Treatment: 0 Active Inactive Abuse / Safety / Falls / Self Care Management Nursing Diagnoses: Impaired home maintenance Impaired physical mobility Potential for falls Self care deficit: actual or potential Goals: Patient will remain injury free Date Initiated: 03/09/2016 Goal Status: Active Patient/caregiver will verbalize understanding of skin care regimen Date Initiated: 03/09/2016 Goal Status: Active Patient/caregiver will verbalize/demonstrate measure taken to improve self care Date Initiated: 03/09/2016 Goal Status: Active Patient/caregiver will verbalize/demonstrate measures taken to improve the patient's personal safety Date Initiated: 03/09/2016 Goal Status: Active Patient/caregiver will  verbalize/demonstrate measures taken to prevent injury and/or falls Date Initiated: 03/09/2016 Goal Status: Active Patient/caregiver will verbalize/demonstrate understanding of what to do in case of emergency Date Initiated: 03/09/2016 Goal Status: Active Interventions: Assess fall risk on admission and as needed Assess: immobility, friction, shearing, incontinence upon admission and as needed Assess impairment of mobility on admission and as needed per policy Assess self care needs on admission and as needed Provide education on basic hygiene Provide education on personal and home safety ARHUM, CAYABYAB ([id number]) Provide education on safe transfers Notes: Orientation to the Wound Care Program Nursing Diagnoses: Knowledge deficit related to the wound healing center program Goals: Patient/caregiver will verbalize understanding of the [facility] Date Initiated: 03/09/2016 Goal Status: Active Interventions: Provide education on orientation to the wound center Notes: Venous Leg Ulcer Nursing Diagnoses: Knowledge deficit related to disease process and management Potential for venous Insuffiency (use before diagnosis confirmed) Goals: Non-invasive venous studies are completed as ordered Date Initiated: 03/09/2016 Goal Status: Active Patient will maintain optimal edema control Date Initiated: 03/09/2016 Goal Status: Active Patient/caregiver will verbalize understanding of disease process and disease management Date Initiated: 03/09/2016 Goal Status: Active Verify adequate tissue perfusion prior to therapeutic compression application Date Initiated: 03/09/2016 Goal Status: Active Interventions: Assess peripheral edema status every visit. Provide education on venous insufficiency Treatment Activities: Therapeutic compression applied : 03/09/2016 Notes: COLSYN, MCGUFFEY. ([id number]) Wound/Skin Impairment Nursing Diagnoses: Impaired tissue  integrity Goals:  Patient/caregiver will verbalize understanding of skin care regimen Date Initiated: 03/09/2016 Goal Status: Active Ulcer/skin breakdown will have a volume reduction of 30% by week 4 Date Initiated: 03/09/2016 Goal Status: Active Ulcer/skin breakdown will have a volume reduction of 50% by week 8 Date Initiated: 03/09/2016 Goal Status: Active Ulcer/skin breakdown will have a volume reduction of 80% by week 12 Date Initiated: 03/09/2016 Goal Status: Active Ulcer/skin breakdown will heal within 14 weeks Date Initiated: 03/09/2016 Goal Status: Active Interventions: Assess patient/caregiver ability to obtain necessary supplies Assess patient/caregiver ability to perform ulcer/skin care regimen upon admission and as needed Assess ulceration(s) every visit Provide education on ulcer and skin care Treatment Activities: Referred to DME Laverle Pillard for dressing supplies : 03/09/2016 Skin care regimen initiated : 03/09/2016 Topical wound management initiated : 03/09/2016 Notes: Electronic Signature(s) Signed: 03/09/2016 5:25:53 PM By: Regan Lemming BSN, RN Entered By: Regan Lemming on 03/09/2016 10:50:12 Soh, [name] [name] ([id number]) -------------------------------------------------------------------------------- Pain Assessment Details Patient Name: [name] [name], Prithvi L. Date of Service: 03/09/2016 9:30 AM Medical Record Number: [id number] Patient Account Number: [id number] Date of Birth/Sex: 11-Nov-1923 (82 y.o. Male) Treating RN: Ahmed Prima Primary Care Physician: [name] Other Clinician: Referring Physician: Toy Baker Treating Physician/Extender: Frann Rider in Treatment: 0 Active Problems Location of Pain Severity and Description of Pain Patient Has Paino Yes Site Locations Pain Location: Pain in Ulcers With Dressing Change: Yes Duration of the Pain. Constant / Intermittento Constant Rate the pain. Current Pain Level: 4 Worst Pain Level:  6 Least Pain Level: 0 Character of Pain Describe the Pain: Burning, Throbbing Pain Management and Medication Current Pain Management: Electronic Signature(s) Signed: 03/09/2016 5:32:44 PM By: Alric Quan Entered By: Alric Quan on 03/09/2016 09:52:43 Ureste, [name] [name] ([id number]) -------------------------------------------------------------------------------- Patient/Caregiver Education Details Patient Name: Earnest Rosier L. Date of Service: 03/09/2016 9:30 AM Medical Record Number: [id number] Patient Account Number: [id number] Date of Birth/Gender: [birth year] (82 y.o. Male) Treating RN: Ahmed Prima Primary Care Physician: [name] Other Clinician: Referring Physician: Toy Baker Treating Physician/Extender: Frann Rider in Treatment: 0 Education Assessment Education Provided To: Patient Education Topics Provided Welcome To [facility]: Handouts: Welcome To [facility] Methods: Explain/Verbal Wound/Skin Impairment: Handouts: Other: change dressing as ordered Methods: Demonstration, Explain/Verbal Responses: State content correctly Electronic Signature(s) Signed: 03/09/2016 5:32:44 PM By: Alric Quan Entered By: Alric Quan on 03/09/2016 10:32:02 Eldred, [name] [name] ([id number]) -------------------------------------------------------------------------------- Wound Assessment Details Patient Name: Veiga, Jakai L. Date of Service: 03/09/2016 9:30 AM Medical Record Number: [id number] Patient Account Number: [id number] Date of Birth/Sex: 1924-03-13 (82 y.o. Male) Treating RN: Ahmed Prima Primary Care Physician: [name] Other Clinician: Referring Physician: Toy Baker Treating Physician/Extender: Frann Rider in Treatment: 0 Wound Status Wound Number: 1 Primary Venous Leg Ulcer Etiology: Wound Location: Left Malleolus - Medial Wound Status: Open Wounding Event: Gradually  Appeared Comorbid Cataracts, Hypertension, Gout, Date Acquired: 02/07/2016 History: Osteoarthritis Weeks Of Treatment: 0 Clustered Wound: No Photos Photo Uploaded By: Alric Quan on 03/09/2016 16:01:06 Wound Measurements Length: (cm) 1.1 Width: (cm) 1.2 Depth: (cm) 0.2 Area: (cm) 1.037 Volume: (cm) 0.207 % Reduction in Area: % Reduction in Volume: Epithelialization: None Tunneling: No Undermining: No Wound Description Classification: Partial Thickness Foul Odor Aft Wound Margin: Distinct, outline attached Exudate Amount: Large Exudate Type: Serosanguineous Exudate Color: red, brown er Cleansing: No Wound Bed Granulation Amount: Medium (34-66%) Exposed Structure Granulation Quality: Pink Fascia Exposed: No Necrotic Amount: Medium (34-66%) Fat Layer Exposed: No Necrotic Quality: Adherent Slough Tendon Exposed: No Lachney, Marshawn L. ([id number])  Muscle Exposed: No Joint Exposed: No Bone Exposed: No Limited to Skin Breakdown Periwound Skin Texture Texture Color No Abnormalities Noted: No No Abnormalities Noted: No Erythema: Yes Moisture Erythema Location: Circumferential No Abnormalities Noted: No Moist: Yes Temperature / Pain Temperature: No Abnormality Tenderness on Palpation: Yes Wound Preparation Ulcer Cleansing: Other: soap and water, Topical Anesthetic Applied: Other: lidocaine 4%, Treatment Notes Wound #1 (Left, Medial Malleolus) 1. Cleansed with: Cleanse wound with antibacterial soap and water 2. Anesthetic Topical Lidocaine 4% cream to wound bed prior to debridement 3. Peri-wound Care: Barrier cream 4. Dressing Applied: Aquacel Ag 5. Secondary Dressing Applied ABD Pad 7. Secured with Tape 3 Layer Compression System - Left Lower Extremity Notes unna to anchor Electronic Signature(s) Signed: 03/09/2016 5:32:44 PM By: Alric Quan Entered By: Alric Quan on 03/09/2016 10:27:37 [place], [name] [name]  ([id number]) -------------------------------------------------------------------------------- Vitals Details Patient Name: [name] [name], Demari L. Date of Service: 03/09/2016 9:30 AM Medical Record Number: [id number] Patient Account Number: [id number] Date of Birth/Sex: 1924/02/20 (82 y.o. Male) Treating RN: Ahmed Prima Primary Care Physician: [name] Other Clinician: Referring Physician: Toy Baker Treating Physician/Extender: Frann Rider in Treatment: 0 Vital Signs Time Taken: 09:53 Temperature (F): 98.1 Height (in): 71 Pulse (bpm): 72 Source: Stated Respiratory Rate (breaths/min): 20 Weight (lbs): 185 Blood Pressure (mmHg): 131/65 Source: Stated Reference Range: 80 - 120 mg / dl Body Mass Index (BMI): 25.8 Electronic Signature(s) Signed: 03/09/2016 5:32:44 PM By: Alric Quan Entered By: Alric Quan on 03/09/2016 09:54:23

## 2016-03-10 NOTE — Progress Notes (Signed)
[name] [name] ([id number]) Visit Report for 03/09/2016 Chief Complaint Document Details Patient Name: [name] [name]. Date of Service: 03/09/2016 9:30 AM Medical Record Number: [id number] Patient Account Number: [id number] Date of Birth/Sex: [birth year] (82 y.o. Male) Treating RN: Ahmed Prima Primary Care Physician: [name] Other Clinician: Referring Physician: Toy [name] Treating Physician/Extender: Frann Rider in Treatment: 0 Information Obtained from: Patient Chief Complaint Patient presents for treatment of an open ulcer due to venous insufficiency to the left ankle which she's had for about a month Electronic Signature(s) Signed: 03/09/2016 10:59:49 AM By: Christin Fudge MD, FACS Entered By: Christin Fudge on 03/09/2016 10:59:49 [name] [name] [name] ([id number]) -------------------------------------------------------------------------------- HPI Details Patient Name: [name] [name] L. Date of Service: 03/09/2016 9:30 AM Medical Record Number: [id number] Patient Account Number: [id number] Date of Birth/Sex: Dec 09, 1923 (82 y.o. Male) Treating RN: Ahmed Prima Primary Care Physician: [name] Other Clinician: Referring Physician: Toy [name] Treating Physician/Extender: Frann Rider in Treatment: 0 History of Present Illness Location: noticed an ulcer on the left ankle about a month Quality: Patient reports experiencing a dull pain to affected area(s). Severity: Patient states wound are getting worse. Duration: Patient has had the wound for < 4 weeks prior to presenting for treatment Timing: Pain in wound is Intermittent (comes and goes Context: The wound appeared gradually over time Modifying Factors: Other treatment(s) tried include:doxycycline on 2 different occasions and a light compression wrap Associated Signs and Symptoms: Patient reports having increase swelling. HPI Description: 82 year old gentleman with a past  medical history of hypertension, polycythemia, diverticulitis, left lower extremity venous ulcer. He has never been a smoker. As recently seen by his PCP Dr. Dagmar Hait, for venous stasis edema with ulcer on the left lower extremity and has completed the [name] course of doxycycline. Is also advised duoderm and wrapping the leg with Kling and Coban. Electronic Signature(s) Signed: 03/09/2016 11:01:03 AM By: Christin Fudge MD, FACS Previous Signature: 03/09/2016 10:24:37 AM Version By: Christin Fudge MD, FACS Entered By: Christin Fudge on 03/09/2016 11:01:02 [name] [name] ([id number]) -------------------------------------------------------------------------------- Physical Exam Details Patient Name: [name] [name] L. Date of Service: 03/09/2016 9:30 AM Medical Record Number: [id number] Patient Account Number: [id number] Date of Birth/Sex: 1924/05/27 (82 y.o. Male) Treating RN: Ahmed Prima Primary Care Physician: [name] Other Clinician: Referring Physician: Toy [name] Treating Physician/Extender: Frann Rider in Treatment: 0 Constitutional . Pulse regular. Respirations normal and unlabored. Afebrile. . Eyes Nonicteric. Reactive to light. Ears, Nose, Mouth, and Throat Lips, teeth, and gums WNL.Marland Kitchen Moist mucosa without lesions. Neck supple and nontender. No palpable supraclavicular or cervical adenopathy. Normal sized without goiter. Respiratory WNL. No retractions.. Cardiovascular Pedal Pulses WNL. ABI on the left was 1.17. had mild lymphedema of the left lower extremity. Lymphatic No adneopathy. No adenopathy. No adenopathy. Musculoskeletal Adexa without tenderness or enlargement.. Digits and nails w/o clubbing, cyanosis, infection, petechiae, ischemia, or inflammatory conditions.. Integumentary (Hair, Skin) No suspicious lesions. No crepitus or fluctuance. No peri-wound warmth or erythema. No masses.Marland Kitchen Psychiatric Judgement and insight Intact.. No evidence of  depression, anxiety, or agitation.. Notes has definite signs of venous hypertension and stasis dermatitis with an ulcerated area on the left medial ankle region which has minimal debris which was cleaned out with moist saline gauze Electronic Signature(s) Signed: 03/09/2016 11:02:34 AM By: Christin Fudge MD, FACS Entered By: Christin Fudge on 03/09/2016 11:02:33 [name] [name] ([id number]) -------------------------------------------------------------------------------- Physician Orders Details Patient Name: [name] [name] [name] L. Date of Service: 03/09/2016 9:30 AM Medical Record Number: [id number] Patient Account Number: [id number] Date of  Birth/Sex: 1924/04/18 (82 y.o. Male) Treating RN: Afful, RN, BSN, Velva Harman Primary Care Physician: [name] Other Clinician: Referring Physician: Toy [name] Treating Physician/Extender: Frann Rider in Treatment: 0 Verbal / Phone Orders: Yes Clinician: Afful, RN, BSN, Rita Read Back and Verified: Yes Diagnosis Coding Wound Cleansing Wound #1 Left,Medial Malleolus o Cleanse wound with mild soap and water - in clinic o May shower with protection. - use big trash bag or a cast protector o No tub bath. Anesthetic Wound #1 Left,Medial Malleolus o Topical Lidocaine 4% cream applied to wound bed prior to debridement - for clinic use Skin Barriers/Peri-Wound Care Wound #1 Left,Medial Malleolus o Barrier cream - Desitin Primary Wound Dressing Wound #1 Left,Medial Malleolus o Aquacel Ag Secondary Dressing Wound #1 Left,Medial Malleolus o ABD pad Dressing Change Frequency Wound #1 Left,Medial Malleolus o Change dressing every week Follow-up Appointments Wound #1 Left,Medial Malleolus o Return Appointment in 1 week. Edema Control Wound #1 Left,Medial Malleolus o 3 Layer Compression System - Left Lower Extremity - unna to anchor [name], [name] ([id number]) Additional Orders / Instructions Wound #1  Left,Medial Malleolus o Increase protein intake. o Activity as tolerated Consults o Vascular - venous studies Electronic Signature(s) Signed: 03/09/2016 4:46:57 PM By: Christin Fudge MD, FACS Signed: 03/09/2016 5:32:44 PM By: Alric Quan Entered By: Alric Quan on 03/09/2016 10:57:42 [place], [name] [name] ([id number]) -------------------------------------------------------------------------------- Problem List Details Patient Name: Toutant, Danney L. Date of Service: 03/09/2016 9:30 AM Medical Record Number: [id number] Patient Account Number: [id number] Date of Birth/Sex: [birth year] (82 y.o. Male) Treating RN: Ahmed Prima Primary Care Physician: [name] Other Clinician: Referring Physician: Toy [name] Treating Physician/Extender: Frann Rider in Treatment: 0 Active Problems ICD-10 Encounter Code Description Active Date Diagnosis D45 Polycythemia vera 03/09/2016 Yes I87.032 Postthrombotic syndrome with ulcer and inflammation of 03/09/2016 Yes left lower extremity L97.322 Non-pressure chronic ulcer of left ankle with fat layer 03/09/2016 Yes exposed Inactive Problems Resolved Problems Electronic Signature(s) Signed: 03/09/2016 10:59:15 AM By: Christin Fudge MD, FACS Entered By: Christin Fudge on 03/09/2016 10:59:15 Cheever, [name] [name] ([id number]) -------------------------------------------------------------------------------- Progress Note Details Patient Name: [name] [name] L. Date of Service: 03/09/2016 9:30 AM Medical Record Number: [id number] Patient Account Number: [id number] Date of Birth/Sex: 08-03-1923 (82 y.o. Male) Treating RN: Ahmed Prima Primary Care Physician: [name] Other Clinician: Referring Physician: Toy [name] Treating Physician/Extender: Frann Rider in Treatment: 0 Subjective Chief Complaint Information obtained from Patient Patient presents for treatment of an open ulcer due to venous  insufficiency to the left ankle which she's had for about a month History of Present Illness (HPI) The following HPI elements were documented for the patient's wound: Location: noticed an ulcer on the left ankle about a month Quality: Patient reports experiencing a dull pain to affected area(s). Severity: Patient states wound are getting worse. Duration: Patient has had the wound for < 4 weeks prior to presenting for treatment Timing: Pain in wound is Intermittent (comes and goes Context: The wound appeared gradually over time Modifying Factors: Other treatment(s) tried include:doxycycline on 2 different occasions and a light compression wrap Associated Signs and Symptoms: Patient reports having increase swelling. 82 year old gentleman with a past medical history of hypertension, polycythemia, diverticulitis, left lower extremity venous ulcer. He has never been a smoker. As recently seen by his PCP Dr. Dagmar Hait, for venous stasis edema with ulcer on the left lower extremity and has completed the [name] course of doxycycline. Is also advised duoderm and wrapping the leg with Kling and Coban. Wound History Patient presents with  1 open wound that has been present for approximately 1 month. Patient has been treating wound in the following manner: wrapped at MD office. Laboratory tests have not been performed in the last month. Patient reportedly has not tested positive for an antibiotic resistant organism. Patient reportedly has not tested positive for osteomyelitis. Patient reportedly has not had testing performed to evaluate circulation in the legs. Patient experiences the following problems associated with their wounds: infection, swelling. Patient History Information obtained from Patient. Allergies penicillin (Severity: Severe), colchicine (Severity: Severe) [name] [name] L. ([id number]) Family History Hypertension - Mother, No family history of Cancer, Diabetes, Heart Disease,  Hereditary Spherocytosis, Kidney Disease, Lung Disease, Seizures, Stroke, Thyroid Problems, Tuberculosis. Social History Never smoker, Marital Status - Married, Alcohol Use - Rarely - quit since taking the xarelto, Drug Use - No History, Caffeine Use - Daily. Medical History Eyes Patient has history of Cataracts - surgery Cardiovascular Patient has history of Hypertension Musculoskeletal Patient has history of Gout, Osteoarthritis Review of Systems (ROS) Constitutional Symptoms ([place]) The patient has no complaints or symptoms. Eyes Complains or has symptoms of Glasses / Contacts. Ear/Nose/Mouth/Throat The patient has no complaints or symptoms. Hematologic/Lymphatic polycythemia vera bone marrow disease hx DVT in bilateral legs Respiratory The patient has no complaints or symptoms. Cardiovascular dyslipidemia mild aortic stenosis Endocrine The patient has no complaints or symptoms. Genitourinary BPH Immunological The patient has no complaints or symptoms. Integumentary (Skin) Complains or has symptoms of Wounds. Musculoskeletal degenerative joint disease Neurologic The patient has no complaints or symptoms. Oncologic The patient has no complaints or symptoms. Psychiatric Complains or has symptoms of Anxiety, depressive [name] [name] L. ([id number]) Objective Constitutional Pulse regular. Respirations normal and unlabored. Afebrile. Vitals Time Taken: 9:53 AM, Height: 71 in, Source: Stated, Weight: 185 lbs, Source: Stated, BMI: 25.8, Temperature: 98.1 F, Pulse: 72 bpm, Respiratory Rate: 20 breaths/min, Blood Pressure: 131/65 mmHg. Eyes Nonicteric. Reactive to light. Ears, Nose, Mouth, and Throat Lips, teeth, and gums WNL.Marland Kitchen Moist mucosa without lesions. Neck supple and nontender. No palpable supraclavicular or cervical adenopathy. Normal sized without goiter. Respiratory WNL. No retractions.. Cardiovascular Pedal Pulses WNL. ABI on the left was 1.17.  had mild lymphedema of the left lower extremity. Lymphatic No adneopathy. No adenopathy. No adenopathy. Musculoskeletal Adexa without tenderness or enlargement.. Digits and nails w/o clubbing, cyanosis, infection, petechiae, ischemia, or inflammatory conditions.Marland Kitchen Psychiatric Judgement and insight Intact.. No evidence of depression, anxiety, or agitation.. General Notes: has definite signs of venous hypertension and stasis dermatitis with an ulcerated area on the left medial ankle region which has minimal debris which was cleaned out with moist saline gauze Integumentary (Hair, Skin) No suspicious lesions. No crepitus or fluctuance. No peri-wound warmth or erythema. No masses.. Wound #1 status is Open. Original cause of wound was Gradually Appeared. The wound is located on the Left,Medial Malleolus. The wound measures 1.1cm length x 1.2cm width x 0.2cm depth; 1.037cm^2 area and 0.207cm^3 volume. The wound is limited to skin breakdown. There is no tunneling or undermining noted. There is a large amount of serosanguineous drainage noted. The wound margin is distinct with the outline attached to the wound base. There is medium (34-66%) pink granulation within the wound bed. There is a medium (34-66%) amount of necrotic tissue within the wound bed including Adherent Slough. [name] [name] [place] ([id number]) The periwound skin appearance exhibited: Moist, Erythema. The surrounding wound skin color is noted with erythema which is circumferential. Periwound temperature was noted as No Abnormality. The periwound has tenderness on palpation.  Assessment Active Problems ICD-10 D45 - Polycythemia vera I87.032 - Postthrombotic syndrome with ulcer and inflammation of left lower extremity L97.322 - Non-pressure chronic ulcer of left ankle with fat layer exposed Elderly gentleman with a history of DVT in the past treated on several toe has not had a venous reflux study done and after review I have  recommended: 1. Silver alginate and a 3 layer Profore lite 2. Venous reflux studies to be done 3. Elevation and exercise. 4. review at weekly intervals. Plan Wound Cleansing: Wound #1 Left,Medial Malleolus: Cleanse wound with mild soap and water - in clinic May shower with protection. - use big trash bag or a cast protector No tub bath. Anesthetic: Wound #1 Left,Medial Malleolus: Topical Lidocaine 4% cream applied to wound bed prior to debridement - for clinic use Skin Barriers/Peri-Wound Care: Wound #1 Left,Medial Malleolus: Barrier cream - Desitin Primary Wound Dressing: Wound #1 Left,Medial Malleolus: Aquacel Ag Secondary Dressing: Wound #1 Left,Medial Malleolus: ABD pad Dressing Change Frequency: Wound #1 Left,Medial Malleolus: [name] [name] L. ([id number]) Change dressing every week Follow-up Appointments: Wound #1 Left,Medial Malleolus: Return Appointment in 1 week. Edema Control: Wound #1 Left,Medial Malleolus: 3 Layer Compression System - Left Lower Extremity - unna to anchor Additional Orders / Instructions: Wound #1 Left,Medial Malleolus: Increase protein intake. Activity as tolerated Consults ordered were: Vascular - venous studies Elderly gentleman with a history of DVT in the past treated on several toe has not had a venous reflux study done and after review I have recommended: 1. Silver alginate and a 3 layer Profore lite 2. Venous reflux studies to be done 3. Elevation and exercise. 4. review at weekly intervals. Electronic Signature(s) Signed: 03/09/2016 11:05:42 AM By: Christin Fudge MD, FACS Entered By: Christin Fudge on 03/09/2016 11:05:42 [place], [name] [name] ([id number]) -------------------------------------------------------------------------------- ROS/PFSH Details Patient Name: [name] [name] [name] L. Date of Service: 03/09/2016 9:30 AM Medical Record Number: [id number] Patient Account Number: [id number] Date of Birth/Sex: 1924-03-02 (82 y.o.  Male) Treating RN: Ahmed Prima Primary Care Physician: [name] Other Clinician: Referring Physician: Toy [name] Treating Physician/Extender: Frann Rider in Treatment: 0 Information Obtained From Patient Wound History Do you currently have one or more open woundso Yes How many open wounds do you currently haveo 1 Approximately how long have you had your woundso 1 month How have you been treating your wound(s) until nowo wrapped at MD office Has your wound(s) ever healed and then re-openedo No Have you had any lab work done in the past montho No Have you tested positive for an antibiotic resistant organism (MRSA, VRE)o No Have you tested positive for osteomyelitis (bone infection)o No Have you had any tests for circulation on your legso No Have you had other problems associated with your woundso Infection, Swelling Eyes Complaints and Symptoms: Positive for: Glasses / Contacts Medical History: Positive for: Cataracts - surgery Integumentary (Skin) Complaints and Symptoms: Positive for: Wounds Psychiatric Complaints and Symptoms: Positive for: Anxiety Review of System Notes: depressive Constitutional Symptoms (General Health) Complaints and Symptoms: No Complaints or Symptoms Ear/Nose/Mouth/Throat [name], [name]. ([id number]) Complaints and Symptoms: No Complaints or Symptoms Hematologic/Lymphatic Complaints and Symptoms: Review of System Notes: polycythemia vera bone marrow disease hx DVT in bilateral legs Respiratory Complaints and Symptoms: No Complaints or Symptoms Cardiovascular Complaints and Symptoms: Review of System Notes: dyslipidemia mild aortic stenosis Medical History: Positive for: Hypertension Endocrine Complaints and Symptoms: No Complaints or Symptoms Genitourinary Complaints and Symptoms: Review of System Notes: BPH Immunological Complaints and Symptoms: No Complaints or  Symptoms  Musculoskeletal Complaints and Symptoms: Review of System Notes: degenerative joint disease Medical History: Positive for: Gout; Osteoarthritis Neurologic Cisney, Samael L. ([id number]) Complaints and Symptoms: No Complaints or Symptoms Oncologic Complaints and Symptoms: No Complaints or Symptoms HBO Extended History Items Eyes: Cataracts Immunizations Pneumococcal Vaccine: Received Pneumococcal Vaccination: Yes Family and Social History Cancer: No; Diabetes: No; Heart Disease: No; Hereditary Spherocytosis: No; Hypertension: Yes - Mother; Kidney Disease: No; Lung Disease: No; Seizures: No; Stroke: No; Thyroid Problems: No; Tuberculosis: No; Never smoker; Marital Status - Married; Alcohol Use: Rarely - quit since taking the xarelto; Drug Use: No History; Caffeine Use: Daily; Financial Concerns: No; Food, Clothing or Shelter Needs: No; Support System Lacking: No; Transportation Concerns: No; Advanced Directives: No; Patient does not want information on Advanced Directives; Do not resuscitate: No; Living Will: Yes (Not Provided); Medical Power of Attorney: No Physician Affirmation I have reviewed and agree with the above information. Electronic Signature(s) Signed: 03/09/2016 4:46:57 PM By: Christin Fudge MD, FACS Signed: 03/09/2016 5:32:44 PM By: Alric Quan Entered By: Christin Fudge on 03/09/2016 10:13:33 [place], [name] [name] ([id number]) -------------------------------------------------------------------------------- SuperBill Details Patient Name: [place], Lottie L. Date of Service: 03/09/2016 Medical Record Number: [id number] Patient Account Number: [id number] Date of Birth/Sex: 1923/10/03 (82 y.o. Male) Treating RN: Ahmed Prima Primary Care Physician: [name] Other Clinician: Referring Physician: Toy [name] Treating Physician/Extender: Frann Rider in Treatment: 0 Diagnosis Coding ICD-10 Codes Code Description D45 Polycythemia  vera I87.032 Postthrombotic syndrome with ulcer and inflammation of left lower extremity L97.322 Non-pressure chronic ulcer of left ankle with fat layer exposed Facility Procedures CPT4 Code: [id number] Description: 99214 - WOUND CARE VISIT-LEV 4 EST PT Modifier: Quantity: 1 Physician Procedures CPT4: Description Modifier Quantity Code [id number] - WC PHYS LEVEL 4 - NEW PT 1 ICD-10 Description Diagnosis D45 Polycythemia vera I87.032 Postthrombotic syndrome with ulcer and inflammation of left lower extremity L97.322 Non-pressure chronic ulcer  of left ankle with fat layer exposed Electronic Signature(s) Signed: 03/09/2016 11:05:56 AM By: Christin Fudge MD, FACS Entered By: Christin Fudge on 03/09/2016 11:05:56

## 2016-03-10 NOTE — Progress Notes (Signed)
JOSIYA, JUNE ([id number]) Visit Report for 03/09/2016 Abuse/Suicide Risk Screen Details Patient Name: [name] [name], [name] [name]. Date of Service: 03/09/2016 9:30 AM Medical Record Number: [id number] Patient Account Number: [id number] Date of Birth/Sex: [birth year] (82 y.o. Male) Treating RN: Ahmed Prima Primary Care Physician: [name] Other Clinician: Referring Physician: Toy Baker Treating Physician/Extender: Frann Rider in Treatment: 0 Abuse/Suicide Risk Screen Items Answer ABUSE/SUICIDE RISK SCREEN: Has anyone close to you tried to hurt or harm you recentlyo No Do you feel uncomfortable with anyone in your familyo No Has anyone forced you do things that you didnot want to doo No Do you have any thoughts of harming yourselfo No Patient displays signs or symptoms of abuse and/or neglect. No Electronic Signature(s) Signed: 03/09/2016 5:32:44 PM By: Alric Quan Entered By: Alric Quan on 03/09/2016 10:04:31 [place], Jaymes Graff ([id number]) -------------------------------------------------------------------------------- Activities of Daily Living Details Patient Name: [name] [name], [name] Date of Service: 03/09/2016 9:30 AM Medical Record Number: [id number] Patient Account Number: [id number] Date of Birth/Sex: April 14, 1924 (82 y.o. Male) Treating RN: Ahmed Prima Primary Care Physician: [name] Other Clinician: Referring Physician: Toy Baker Treating Physician/Extender: Frann Rider in Treatment: 0 Activities of Daily Living Items Answer Activities of Daily Living (Please select one for each item) Drive Automobile Completely Able Take Medications Completely Able Use Telephone Completely Able Care for Appearance Completely Able Use Toilet Completely Able Bath / Shower Completely Able Dress Self Completely Able Feed Self Completely Able Walk Completely Able Get In / Out Bed Completely Able Housework Completely  Able Prepare Meals Completely Able Handle Money Completely Able Shop for Self Completely Able Electronic Signature(s) Signed: 03/09/2016 5:32:44 PM By: Alric Quan Entered By: Alric Quan on 03/09/2016 10:04:48 [place], Jaymes Graff ([id number]) -------------------------------------------------------------------------------- Education Assessment Details Patient Name: [name] [name], [name] Date of Service: 03/09/2016 9:30 AM Medical Record Number: [id number] Patient Account Number: [id number] Date of Birth/Sex: 08/21/1923 (82 y.o. Male) Treating RN: Ahmed Prima Primary Care Physician: [name] Other Clinician: Referring Physician: Toy Baker Treating Physician/Extender: Frann Rider in Treatment: 0 Primary Learner Assessed: Patient Learning Preferences/Education Level/Primary Language Learning Preference: Explanation, Printed Material Highest Education Level: College or Above Preferred Language: English Cognitive Barrier Assessment/Beliefs Language Barrier: No Translator Needed: No Memory Deficit: No Emotional Barrier: No Cultural/Religious Beliefs Affecting Medical No Care: Physical Barrier Assessment Impaired Vision: Yes Glasses Impaired Hearing: No Decreased Hand dexterity: No Knowledge/Comprehension Assessment Knowledge Level: High Comprehension Level: High Ability to understand written High instructions: Ability to understand verbal High instructions: Motivation Assessment Anxiety Level: Calm Cooperation: Cooperative Education Importance: Acknowledges Need Interest in Health Problems: Asks Questions Perception: Coherent Willingness to Engage in Self- High Management Activities: Readiness to Engage in Self- High Management Activities: Electronic Signature(s) DVON, KUDELKA ([id number]) Signed: 03/09/2016 5:32:44 PM By: Alric Quan Entered By: Alric Quan on 03/09/2016 10:05:06 [name]  ([id number]) -------------------------------------------------------------------------------- Fall Risk Assessment Details Patient Name: [name] [name], [name] Date of Service: 03/09/2016 9:30 AM Medical Record Number: [id number] Patient Account Number: [id number] Date of Birth/Sex: Nov 12, 1923 (82 y.o. Male) Treating RN: Ahmed Prima Primary Care Physician: [name] Other Clinician: Referring Physician: Toy Baker Treating Physician/Extender: Frann Rider in Treatment: 0 Fall Risk Assessment Items Have you had 2 or more falls in the last 12 monthso 0 No Have you had any fall that resulted in injury in the last 12 monthso 0 No FALL RISK ASSESSMENT: History of falling - immediate or within 3 months 0 No Secondary diagnosis 0 No Ambulatory aid None/bed rest/wheelchair/nurse 0 No Crutches/cane/walker 0  No Furniture 0 No IV Access/Saline Lock 0 No Gait/Training Normal/bed rest/immobile 0 No Weak 0 No Impaired 0 No Mental Status Oriented to own ability 0 Yes Electronic Signature(s) Signed: 03/09/2016 5:32:44 PM By: Alric Quan Entered By: Alric Quan on 03/09/2016 10:05:41 [place], Jaymes Graff ([id number]) -------------------------------------------------------------------------------- Nutrition Risk Assessment Details Patient Name: [name] [name], [name] Date of Service: 03/09/2016 9:30 AM Medical Record Number: [id number] Patient Account Number: [id number] Date of Birth/Sex: 25-Dec-1923 (82 y.o. Male) Treating RN: Ahmed Prima Primary Care Physician: [name] Other Clinician: Referring Physician: Toy Baker Treating Physician/Extender: Frann Rider in Treatment: 0 Height (in): 71 Weight (lbs): 185 Body Mass Index (BMI): 25.8 Nutrition Risk Assessment Items NUTRITION RISK SCREEN: I have an illness or condition that made me change the kind and/or 0 No amount of food I eat I eat fewer than two meals per day 0 No I eat few  fruits and vegetables, or milk products 0 No I have three or more drinks of beer, liquor or wine almost every day 0 No I have tooth or mouth problems that make it hard for me to eat 0 No I don't always have enough money to buy the food I need 0 No I eat alone most of the time 0 No I take three or more different prescribed or over-the-counter drugs a 1 Yes day Without wanting to, I have lost or gained 10 pounds in the last six 0 No months I am not always physically able to shop, cook and/or feed myself 0 No Nutrition Protocols Good Risk Protocol Moderate Risk Protocol Electronic Signature(s) Signed: 03/09/2016 5:32:44 PM By: Alric Quan Entered By: Alric Quan on 03/09/2016 10:05:59

## 2016-03-16 ENCOUNTER — Encounter: Payer: Medicare Other | Admitting: Surgery

## 2016-03-16 DIAGNOSIS — I87032 Postthrombotic syndrome with ulcer and inflammation of left lower extremity: Secondary | ICD-10-CM | POA: Diagnosis not present

## 2016-03-16 DIAGNOSIS — L97322 Non-pressure chronic ulcer of left ankle with fat layer exposed: Secondary | ICD-10-CM | POA: Diagnosis not present

## 2016-03-16 DIAGNOSIS — M109 Gout, unspecified: Secondary | ICD-10-CM | POA: Diagnosis not present

## 2016-03-16 DIAGNOSIS — I87312 Chronic venous hypertension (idiopathic) with ulcer of left lower extremity: Secondary | ICD-10-CM | POA: Diagnosis not present

## 2016-03-16 DIAGNOSIS — S81802A Unspecified open wound, left lower leg, initial encounter: Secondary | ICD-10-CM | POA: Diagnosis not present

## 2016-03-16 DIAGNOSIS — D45 Polycythemia vera: Secondary | ICD-10-CM | POA: Diagnosis not present

## 2016-03-16 DIAGNOSIS — Z880 Allergy status to penicillin: Secondary | ICD-10-CM | POA: Diagnosis not present

## 2016-03-16 DIAGNOSIS — I10 Essential (primary) hypertension: Secondary | ICD-10-CM | POA: Diagnosis not present

## 2016-03-16 DIAGNOSIS — L97321 Non-pressure chronic ulcer of left ankle limited to breakdown of skin: Secondary | ICD-10-CM | POA: Diagnosis not present

## 2016-03-17 NOTE — Progress Notes (Signed)
[name] [name], [name] [name] ([id number]) Visit Report for 03/16/2016 Arrival Information Details Patient Name: [name] [name], [name] [name]. Date of Service: 03/16/2016 12:45 PM Medical Record Number: [id number] Patient Account Number: [id number] Date of Birth/Sex: 19-Sep-1923 (82 y.o. Male) Treating RN: [name] [name] Primary Care Physician: [name] [name] Other Clinician: Referring Physician: Prince [name] Treating Physician/Extender: [name] [name] in Treatment: 1 Visit Information History Since Last Visit All ordered tests and consults were completed: No Patient Arrived: Ambulatory Added or deleted any medications: No Arrival Time: 12:48 Any new allergies or adverse reactions: No Accompanied By: sister in law Had a fall or experienced change in No Transfer Assistance: None activities of daily living that may affect Patient Identification Verified: Yes risk of falls: Secondary Verification Process Yes Signs or symptoms of abuse/neglect since last No Completed: visito Patient Requires Transmission- No Hospitalized since last visit: No Based Precautions: Pain Present Now: No Patient Has Alerts: Yes Patient Alerts: Patient on Blood Thinner Xarelto Electronic Signature(s) Signed: 03/16/2016 4:42:28 PM By: [name] [name] Entered By: [name] [name] on 03/16/2016 12:49:14 [name] [name], [name] [name] ([id number]) -------------------------------------------------------------------------------- Clinic Level of Care Assessment Details Patient Name: [name] [name]. Date of Service: 03/16/2016 12:45 PM Medical Record Number: [id number] Patient Account Number: [id number] Date of Birth/Sex: March 07, 1924 (82 y.o. Male) Treating RN: Baruch Gouty, RN, BSN, [place] Primary Care Physician: [name] [name] Other Clinician: Referring Physician: Prince [name] Treating Physician/Extender: [name] [name] in Treatment: 1 Clinic Level of Care Assessment Items TOOL 4 Quantity Score []  - Use when only an EandM is  performed on FOLLOW-UP visit 0 ASSESSMENTS - Nursing Assessment / Reassessment X - Reassessment of Co-morbidities (includes updates in patient status) 1 10 X - Reassessment of Adherence to Treatment Plan 1 5 ASSESSMENTS - Wound and Skin Assessment / Reassessment []  - Simple Wound Assessment / Reassessment - one wound 0 X - Complex Wound Assessment / Reassessment - multiple wounds 2 5 []  - Dermatologic / Skin Assessment (not related to wound area) 0 ASSESSMENTS - Focused Assessment []  - Circumferential Edema Measurements - multi extremities 0 []  - Nutritional Assessment / Counseling / Intervention 0 X - Lower Extremity Assessment (monofilament, tuning fork, pulses) 1 5 []  - Peripheral Arterial Disease Assessment (using hand held doppler) 0 ASSESSMENTS - Ostomy and/or Continence Assessment and Care []  - Incontinence Assessment and Management 0 []  - Ostomy Care Assessment and Management (repouching, etc.) 0 PROCESS - Coordination of Care X - Simple Patient / Family Education for ongoing care 1 15 []  - Complex (extensive) Patient / Family Education for ongoing care 0 X - Staff obtains Programmer, systems, Records, Test Results / Process Orders 1 10 []  - Staff telephones HHA, Nursing Homes / Clarify orders / etc 0 []  - Routine Transfer to another Facility (non-emergent condition) 0 [name], [name] []  - Routine Hospital Admission (non-emergent condition) 0 []  - New Admissions / Biomedical engineer / Ordering NPWT, Apligraf, etc. 0 []  - Emergency Hospital Admission (emergent condition) 0 []  - Simple Discharge Coordination 0 []  - Complex (extensive) Discharge Coordination 0 PROCESS - Special Needs []  - Pediatric / Minor Patient Management 0 []  - Isolation Patient Management 0 []  - Hearing / Language / Visual special needs 0 []  - Assessment of Community assistance (transportation, D/C planning, etc.) 0 []  - Additional assistance / Altered mentation 0 []  - Support Surface(s)  Assessment (bed, cushion, seat, etc.) 0 INTERVENTIONS - Wound Cleansing / Measurement []  - Simple Wound Cleansing - one wound 0 X - Complex Wound Cleansing - multiple wounds 2 5 X -  Wound Imaging (photographs - any number of wounds) 1 5 []  - Wound Tracing (instead of photographs) 0 []  - Simple Wound Measurement - one wound 0 X - Complex Wound Measurement - multiple wounds 2 5 INTERVENTIONS - Wound Dressings X - Small Wound Dressing one or multiple wounds 2 10 []  - Medium Wound Dressing one or multiple wounds 0 []  - Large Wound Dressing one or multiple wounds 0 []  - Application of Medications - topical 0 []  - Application of Medications - injection 0 INTERVENTIONS - Miscellaneous []  - External ear exam 0 [name] [name], [name] [name]. ([id number]) []  - Specimen Collection (cultures, biopsies, blood, body fluids, etc.) 0 []  - Specimen(s) / Culture(s) sent or taken to Lab for analysis 0 []  - Patient Transfer (multiple staff / Harrel Lemon Lift / Similar devices) 0 []  - Simple Staple / Suture removal (25 or less) 0 []  - Complex Staple / Suture removal (26 or more) 0 []  - Hypo / Hyperglycemic Management (close monitor of Blood Glucose) 0 []  - Ankle / Brachial Index (ABI) - do not check if billed separately 0 X - Vital Signs 1 5 Has the patient been seen at the hospital within the last three years: Yes Total Score: 105 Level Of Care: New/Established - Level 3 Electronic Signature(s) Signed: 03/16/2016 5:32:33 PM By: Regan Lemming BSN, RN Entered By: Regan Lemming on 03/16/2016 16:55:42 [name] [name], [name] [name] ([id number]) -------------------------------------------------------------------------------- Encounter Discharge Information Details Patient Name: [name] [name], [name] [name]. Date of Service: 03/16/2016 12:45 PM Medical Record Number: [id number] Patient Account Number: [id number] Date of Birth/Sex: 12-25-1923 (82 y.o. Male) Treating RN: [name] [name] Primary Care Physician: [name] [name] Other Clinician: Referring  Physician: Prince [name] Treating Physician/Extender: [name] [name] in Treatment: 1 Encounter Discharge Information Items Discharge Pain Level: 0 Discharge Condition: Stable Ambulatory Status: Ambulatory Discharge Destination: Home Transportation: Private Auto Accompanied By: sister-in-law Schedule Follow-up Appointment: Yes Medication Reconciliation completed and provided to Patient/Care Yes Tahari Clabaugh: Provided on Clinical Summary of Care: 03/16/2016 Form Type Recipient Paper Patient RS Electronic Signature(s) Signed: 03/16/2016 1:34:41 PM By: Ruthine Dose Entered By: Ruthine Dose on 03/16/2016 13:34:41 Scroggins, [name] [name] ([id number]) -------------------------------------------------------------------------------- Lower Extremity Assessment Details Patient Name: Abair, Ahmeer [name]. Date of Service: 03/16/2016 12:45 PM Medical Record Number: [id number] Patient Account Number: [id number] Date of Birth/Sex: December 28, 1923 (82 y.o. Male) Treating RN: [name] [name] Primary Care Physician: [name] [name] Other Clinician: Referring Physician: Prince [name] Treating Physician/Extender: [name] [name] in Treatment: 1 Edema Assessment Assessed: [Left: No] [Right: No] E[Left: dema] [Right: :] Calf Left: Right: Point of Measurement: 34 cm From Medial Instep 35.5 cm cm Ankle Left: Right: Point of Measurement: 10 cm From Medial Instep 23.6 cm cm Vascular Assessment Pulses: Posterior Tibial Palpable: [Left:Yes] Extremity colors, hair growth, and conditions: Extremity Color: [Left:Red] Temperature of Extremity: [Left:Warm] Capillary Refill: [Left:< 3 seconds] Toe Nail Assessment Left: Right: Thick: No Discolored: No Deformed: No Improper Length and Hygiene: No Electronic Signature(s) Signed: 03/16/2016 4:42:28 PM By: [name] [name] Entered By: [name] [name] on 03/16/2016 12:58:15 [name] [name], [name] [name]  ([id number]) -------------------------------------------------------------------------------- Multi Wound Chart Details Patient Name: [name] [name], Kwamaine [name]. Date of Service: 03/16/2016 12:45 PM Medical Record Number: [id number] Patient Account Number: [id number] Date of Birth/Sex: [birth year] (82 y.o. Male) Treating RN: Baruch Gouty, RN, BSN, Velva Harman Primary Care Physician: [name] [name] Other Clinician: Referring Physician: Prince [name] Treating Physician/Extender: [name] [name] in Treatment: 1 Vital Signs Height(in): 71 Pulse(bpm): 79 Weight(lbs): 185 Blood Pressure 141/68 (mmHg): Body Mass Index(BMI): 26 Temperature(F): 97.7 Respiratory Rate 20 (breaths/min): Photos: [1:No Photos] [2:No  Photos] [N/A:N/A] Wound Location: [1:Left Malleolus - Medial] [2:Left Lower Leg - Medial] [N/A:N/A] Wounding Event: [1:Gradually Appeared] [2:Gradually Appeared] [N/A:N/A] Primary Etiology: [1:Venous Leg Ulcer] [2:To be determined] [N/A:N/A] Comorbid History: [1:Cataracts, Hypertension, Gout, Osteoarthritis] [2:Cataracts, Hypertension, Gout, Osteoarthritis] [N/A:N/A] Date Acquired: [1:02/07/2016] [2:03/16/2016] [N/A:N/A] Weeks of Treatment: [1:1] [2:0] [N/A:N/A] Wound Status: [1:Open] [2:Open] [N/A:N/A] Measurements [name] x W x D 1.1x1x0.2 [2:1.3x2.5x0.1] [N/A:N/A] (cm) Area (cm) : [1:0.864] [2:2.553] [N/A:N/A] Volume (cm) : [1:0.173] [2:0.255] [N/A:N/A] % Reduction in Area: [1:16.70%] [2:N/A] [N/A:N/A] % Reduction in Volume: 16.40% [2:N/A] [N/A:N/A] Classification: [1:Partial Thickness] [2:Partial Thickness] [N/A:N/A] Exudate Amount: [1:Large] [2:Large] [N/A:N/A] Exudate Type: [1:Serosanguineous] [2:Serosanguineous] [N/A:N/A] Exudate Color: [1:red, brown] [2:red, brown] [N/A:N/A] Wound Margin: [1:Distinct, outline attached] [2:Distinct, outline attached] [N/A:N/A] Granulation Amount: [1:Medium (34-66%)] [2:Large (67-100%)] [N/A:N/A] Granulation Quality: [1:Pink] [2:Red] [N/A:N/A] Necrotic  Amount: [1:Medium (34-66%)] [2:Small (1-33%)] [N/A:N/A] Exposed Structures: [1:Fascia: No Fat: No Tendon: No Muscle: No Joint: No Bone: No] [2:Fascia: No Fat: No Tendon: No Muscle: No Joint: No Bone: No] [N/A:N/A] Limited to Skin Limited to Skin Breakdown Breakdown Epithelialization: None None N/A Periwound Skin Texture: No Abnormalities Noted No Abnormalities Noted N/A Periwound Skin Moist: Yes Moist: Yes N/A Moisture: Periwound Skin Color: Erythema: Yes No Abnormalities Noted N/A Erythema Location: Circumferential N/A N/A Temperature: No Abnormality No Abnormality N/A Tenderness on Yes Yes N/A Palpation: Wound Preparation: Ulcer Cleansing: Other: Ulcer Cleansing: Other: N/A soap and water soap and water Topical Anesthetic Topical Anesthetic Applied: Other: lidocaine Applied: Other: lidocaine 4% 4% Treatment Notes Electronic Signature(s) Signed: 03/16/2016 5:32:33 PM By: Regan Lemming BSN, RN Entered By: Regan Lemming on 03/16/2016 13:17:20 [name] [name], [name] [name] ([id number]) -------------------------------------------------------------------------------- [place] Details Patient Name: [name] [name], Olman [name]. Date of Service: 03/16/2016 12:45 PM Medical Record Number: [id number] Patient Account Number: [id number] Date of Birth/Sex: 11/10/1923 (82 y.o. Male) Treating RN: Baruch Gouty, RN, BSN, Velva Harman Primary Care Physician: [name] [name] Other Clinician: Referring Physician: Prince [name] Treating Physician/Extender: [name] [name] in Treatment: 1 Active Inactive Abuse / Safety / Falls / Self Care Management Nursing Diagnoses: Impaired home maintenance Impaired physical mobility Potential for falls Self care deficit: actual or potential Goals: Patient will remain injury free Date Initiated: 03/09/2016 Goal Status: Active Patient/caregiver will verbalize understanding of skin care regimen Date Initiated: 03/09/2016 Goal Status: Active Patient/caregiver will  verbalize/demonstrate measure taken to improve self care Date Initiated: 03/09/2016 Goal Status: Active Patient/caregiver will verbalize/demonstrate measures taken to improve the patient's personal safety Date Initiated: 03/09/2016 Goal Status: Active Patient/caregiver will verbalize/demonstrate measures taken to prevent injury and/or falls Date Initiated: 03/09/2016 Goal Status: Active Patient/caregiver will verbalize/demonstrate understanding of what to do in case of emergency Date Initiated: 03/09/2016 Goal Status: Active Interventions: Assess fall risk on admission and as needed Assess: immobility, friction, shearing, incontinence upon admission and as needed Assess impairment of mobility on admission and as needed per policy Assess self care needs on admission and as needed Provide education on basic hygiene Provide education on personal and home safety [name] [name], [name] [name] ([id number]) Provide education on safe transfers Notes: Orientation to the Wound Care Program Nursing Diagnoses: Knowledge deficit related to the wound healing center program Goals: Patient/caregiver will verbalize understanding of the [place] [facility] Date Initiated: 03/09/2016 Goal Status: Active Interventions: Provide education on orientation to the wound center Notes: Venous Leg Ulcer Nursing Diagnoses: Knowledge deficit related to disease process and management Potential for venous Insuffiency (use before diagnosis confirmed) Goals: Non-invasive venous studies are completed as ordered Date Initiated: 03/09/2016 Goal Status: Active Patient will maintain optimal  edema control Date Initiated: 03/09/2016 Goal Status: Active Patient/caregiver will verbalize understanding of disease process and disease management Date Initiated: 03/09/2016 Goal Status: Active Verify adequate tissue perfusion prior to therapeutic compression application Date Initiated: 03/09/2016 Goal Status:  Active Interventions: Assess peripheral edema status every visit. Provide education on venous insufficiency Treatment Activities: Therapeutic compression applied : 03/09/2016 Notes: [name] [name], [name] [name] ([id number]) Wound/Skin Impairment Nursing Diagnoses: Impaired tissue integrity Goals: Patient/caregiver will verbalize understanding of skin care regimen Date Initiated: 03/09/2016 Goal Status: Active Ulcer/skin breakdown will have a volume reduction of 30% by week 4 Date Initiated: 03/09/2016 Goal Status: Active Ulcer/skin breakdown will have a volume reduction of 50% by week 8 Date Initiated: 03/09/2016 Goal Status: Active Ulcer/skin breakdown will have a volume reduction of 80% by week 12 Date Initiated: 03/09/2016 Goal Status: Active Ulcer/skin breakdown will heal within 14 weeks Date Initiated: 03/09/2016 Goal Status: Active Interventions: Assess patient/caregiver ability to obtain necessary supplies Assess patient/caregiver ability to perform ulcer/skin care regimen upon admission and as needed Assess ulceration(s) every visit Provide education on ulcer and skin care Treatment Activities: Referred to DME Tionne Carelli for dressing supplies : 03/09/2016 Skin care regimen initiated : 03/09/2016 Topical wound management initiated : 03/09/2016 Notes: Electronic Signature(s) Signed: 03/16/2016 5:32:33 PM By: Regan Lemming BSN, RN Entered By: Regan Lemming on 03/16/2016 [name] [name], [name] [name] ([id number]) -------------------------------------------------------------------------------- Pain Assessment Details Patient Name: [name] [name], Arrian [name]. Date of Service: 03/16/2016 12:45 PM Medical Record Number: [id number] Patient Account Number: [id number] Date of Birth/Sex: [birth year] (82 y.o. Male) Treating RN: [name] [name] Primary Care Physician: [name] [name] Other Clinician: Referring Physician: Prince [name] Treating Physician/Extender: [name] [name] in Treatment: 1 Active  Problems Location of Pain Severity and Description of Pain Patient Has Paino No Site Locations With Dressing Change: No Pain Management and Medication Current Pain Management: Electronic Signature(s) Signed: 03/16/2016 4:42:28 PM By: [name] [name] Entered By: [name] [name] on 03/16/2016 12:49:18 [name] [name], [name] [name] ([id number]) -------------------------------------------------------------------------------- Patient/Caregiver Education Details Patient Name: [name] [name]. Date of Service: 03/16/2016 12:45 PM Medical Record Number: [id number] Patient Account Number: [id number] Date of Birth/Gender: 1924-05-26 (82 y.o. Male) Treating RN: [name] [name] Primary Care Physician: [name] [name] Other Clinician: Referring Physician: Prince [name] Treating Physician/Extender: [name] [name] in Treatment: 1 Education Assessment Education Provided To: Patient Education Topics Provided Wound/Skin Impairment: Handouts: Other: change dressing as ordered and do not get dressings wet Methods: Demonstration, Explain/Verbal Responses: State content correctly Electronic Signature(s) Signed: 03/16/2016 4:42:28 PM By: [name] [name] Entered By: [name] [name] on 03/16/2016 13:12:39 Bieser, [name] [name] ([id number]) -------------------------------------------------------------------------------- Wound Assessment Details Patient Name: Banas, Taivon [name]. Date of Service: 03/16/2016 12:45 PM Medical Record Number: [id number] Patient Account Number: [id number] Date of Birth/Sex: Apr 28, 1924 (82 y.o. Male) Treating RN: [name] [name] Primary Care Physician: [name] [name] Other Clinician: Referring Physician: Prince [name] Treating Physician/Extender: [name] [name] in Treatment: 1 Wound Status Wound Number: 1 Primary Venous Leg Ulcer Etiology: Wound Location: Left Malleolus - Medial Wound Status: Open Wounding Event: Gradually Appeared Comorbid Cataracts, Hypertension,  Gout, Date Acquired: 02/07/2016 History: Osteoarthritis Weeks Of Treatment: 1 Clustered Wound: No Photos Photo Uploaded By: [name] [name] on 03/16/2016 15:06:35 Wound Measurements Length: (cm) 1.1 Width: (cm) 1 Depth: (cm) 0.2 Area: (cm) 0.864 Volume: (cm) 0.173 % Reduction in Area: 16.7% % Reduction in Volume: 16.4% Epithelialization: None Tunneling: No Undermining: No Wound Description Classification: Partial Thickness Wound Margin: Distinct, outline attached Exudate Amount: Large Exudate Type: Serosanguineous Exudate Color: red, brown Foul Odor After Cleansing: No Wound Bed Granulation Amount: Medium (  34-66%) Exposed Structure Granulation Quality: Pink Fascia Exposed: No Necrotic Amount: Medium (34-66%) Fat Layer Exposed: No Necrotic Quality: Adherent Slough Tendon Exposed: No Pusey, Reginal [name]. ([id number]) Muscle Exposed: No Joint Exposed: No Bone Exposed: No Limited to Skin Breakdown Periwound Skin Texture Texture Color No Abnormalities Noted: No No Abnormalities Noted: No Erythema: Yes Moisture Erythema Location: Circumferential No Abnormalities Noted: No Moist: Yes Temperature / Pain Temperature: No Abnormality Tenderness on Palpation: Yes Wound Preparation Ulcer Cleansing: Other: soap and water, Topical Anesthetic Applied: Other: lidocaine 4%, Treatment Notes Wound #1 (Left, Medial Malleolus) 1. Cleansed with: Cleanse wound with antibacterial soap and water 3. Peri-wound Care: Barrier cream 4. Dressing Applied: Aquacel Ag 5. Secondary Dressing Applied ABD Pad Dry Gauze 7. Secured with Tape 3 Layer Compression System - Left Lower Extremity Notes unna to anchor Electronic Signature(s) Signed: 03/16/2016 4:42:28 PM By: [name] [name] Entered By: [name] [name] on 03/16/2016 13:04:45 Selway, [name] [name] ([id number]) -------------------------------------------------------------------------------- Wound Assessment Details Patient  Name: Hlad, Olegario [name]. Date of Service: 03/16/2016 12:45 PM Medical Record Number: [id number] Patient Account Number: [id number] Date of Birth/Sex: 15-Aug-1923 (82 y.o. Male) Treating RN: [name] [name] Primary Care Physician: [name] [name] Other Clinician: Referring Physician: Prince [name] Treating Physician/Extender: [name] [name] in Treatment: 1 Wound Status Wound Number: 2 Primary To be determined Etiology: Wound Location: Left Lower Leg - Medial Wound Status: Open Wounding Event: Gradually Appeared Comorbid Cataracts, Hypertension, Gout, Date Acquired: 03/16/2016 History: Osteoarthritis Weeks Of Treatment: 0 Clustered Wound: No Photos Photo Uploaded By: [name] [name] on 03/16/2016 15:06:35 Wound Measurements Length: (cm) 1.3 Width: (cm) 2.5 Depth: (cm) 0.1 Area: (cm) 2.553 Volume: (cm) 0.255 % Reduction in Area: % Reduction in Volume: Epithelialization: None Tunneling: No Undermining: No Wound Description Classification: Partial Thickness Wound Margin: Distinct, outline attached Exudate Amount: Large Exudate Type: Serosanguineous Exudate Color: red, brown Foul Odor After Cleansing: No Wound Bed Granulation Amount: Large (67-100%) Exposed Structure Granulation Quality: Red Fascia Exposed: No Necrotic Amount: Small (1-33%) Fat Layer Exposed: No Necrotic Quality: Adherent Slough Tendon Exposed: No Newman, Layden [name]. ([id number]) Muscle Exposed: No Joint Exposed: No Bone Exposed: No Limited to Skin Breakdown Periwound Skin Texture Texture Color No Abnormalities Noted: No No Abnormalities Noted: No Moisture Temperature / Pain No Abnormalities Noted: No Temperature: No Abnormality Moist: Yes Tenderness on Palpation: Yes Wound Preparation Ulcer Cleansing: Other: soap and water, Topical Anesthetic Applied: Other: lidocaine 4%, Treatment Notes Wound #2 (Left, Medial Lower Leg) 1. Cleansed with: Cleanse wound with antibacterial soap and  water 3. Peri-wound Care: Barrier cream 4. Dressing Applied: Aquacel Ag 5. Secondary Dressing Applied ABD Pad Dry Gauze 7. Secured with Tape 3 Layer Compression System - Left Lower Extremity Notes unna to anchor Electronic Signature(s) Signed: 03/16/2016 4:42:28 PM By: [name] [name] Entered By: [name] [name] on 03/16/2016 13:06:30 Crook, [name] [name] ([id number]) -------------------------------------------------------------------------------- [place] Details Patient Name: [name] [name], Serigne [name]. Date of Service: 03/16/2016 12:45 PM Medical Record Number: [id number] Patient Account Number: [id number] Date of Birth/Sex: 1924-06-11 (82 y.o. Male) Treating RN: [name] [name] Primary Care Physician: [name] [name] Other Clinician: Referring Physician: Prince [name] Treating Physician/Extender: [name] [name] in Treatment: 1 Vital Signs Time Taken: 12:50 Temperature (F): 97.7 Height (in): 71 Pulse (bpm): 79 Weight (lbs): 185 Respiratory Rate (breaths/min): 20 Body Mass Index (BMI): 25.8 Blood Pressure (mmHg): 141/68 Reference Range: 80 - 120 mg / dl Electronic Signature(s) Signed: 03/16/2016 4:42:28 PM By: [name] [name] Entered By: [name] [name] on 03/16/2016 12:50:43

## 2016-03-17 NOTE — Progress Notes (Signed)
[name], [name] ([id number]) Visit Report for 03/16/2016 Chief Complaint Document Details Patient Name: [name] [name], [name] [name]. Date of Service: 03/16/2016 12:45 PM Medical Record Number: [id number] Patient Account Number: [id number] Date of Birth/Sex: February 23, 1924 (82 y.o. Male) Treating RN: Ahmed Prima Primary Care Physician: [name] Other Clinician: Referring Physician: [name] Treating Physician/Extender: Frann Rider in Treatment: 1 Information Obtained from: Patient Chief Complaint Patient presents for treatment of an open ulcer due to venous insufficiency to the left ankle which she's had for about a month Electronic Signature(s) Signed: 03/16/2016 1:24:35 PM By: Christin Fudge MD, FACS Entered By: Christin Fudge on 03/16/2016 13:24:35 Schnick, Jaymes Graff ([id number]) -------------------------------------------------------------------------------- HPI Details Patient Name: [name], [name] Date of Service: 03/16/2016 12:45 PM Medical Record Number: [id number] Patient Account Number: [id number] Date of Birth/Sex: 08/01/1923 (82 y.o. Male) Treating RN: Ahmed Prima Primary Care Physician: [name] Other Clinician: Referring Physician: [name] Treating Physician/Extender: Frann Rider in Treatment: 1 History of Present Illness Location: noticed an ulcer on the left ankle about a month Quality: Patient reports experiencing a dull pain to affected area(s). Severity: Patient states wound are getting worse. Duration: Patient has had the wound for < 4 weeks prior to presenting for treatment Timing: Pain in wound is Intermittent (comes and goes Context: The wound appeared gradually over time Modifying Factors: Other treatment(s) tried include:doxycycline on 2 different occasions and a light compression wrap Associated Signs and Symptoms: Patient reports having increase swelling. HPI Description: 82 year old gentleman with a past medical  history of hypertension, polycythemia, diverticulitis, left lower extremity venous ulcer. He has never been a smoker. As recently seen by his PCP Dr. Dagmar Hait, for venous stasis edema with ulcer on the left lower extremity and has completed the second course of doxycycline. Is also advised duoderm and wrapping the leg with Kling and Coban. 03/16/2016 -- not yet received his vascular appointment for venous duplex studies. Due to his original wound he now had a blister which has opened up to a superficial ulceration Electronic Signature(s) Signed: 03/16/2016 1:25:15 PM By: Christin Fudge MD, FACS Entered By: Christin Fudge on 03/16/2016 13:25:15 [place], Jaymes Graff ([id number]) -------------------------------------------------------------------------------- Physical Exam Details Patient Name: [name], [name] Date of Service: 03/16/2016 12:45 PM Medical Record Number: [id number] Patient Account Number: [id number] Date of Birth/Sex: August 13, 1923 (82 y.o. Male) Treating RN: Ahmed Prima Primary Care Physician: [name] Other Clinician: Referring Physician: [name] Treating Physician/Extender: Frann Rider in Treatment: 1 Constitutional . Pulse regular. Respirations normal and unlabored. Afebrile. . Eyes Nonicteric. Reactive to light. Ears, Nose, Mouth, and Throat Lips, teeth, and gums WNL.Marland Kitchen Moist mucosa without lesions. Neck supple and nontender. No palpable supraclavicular or cervical adenopathy. Normal sized without goiter. Respiratory WNL. No retractions.. Breath sounds WNL, No rubs, rales, rhonchi, or wheeze.. Cardiovascular Heart rhythm and rate regular, no murmur or gallop.. Pedal Pulses WNL. No clubbing, cyanosis or edema. Lymphatic No adneopathy. No adenopathy. No adenopathy. Musculoskeletal Adexa without tenderness or enlargement.. Digits and nails w/o clubbing, cyanosis, infection, petechiae, ischemia, or inflammatory conditions.. Integumentary (Hair,  Skin) No suspicious lesions. No crepitus or fluctuance. No peri-wound warmth or erythema. No masses.Marland Kitchen Psychiatric Judgement and insight Intact.. No evidence of depression, anxiety, or agitation.. Notes the original ulcer continues to have minimal debris which was cleaned out with moist saline gauze. Superior to this he has a superficial breakdown of skin possibly due to a blister Electronic Signature(s) Signed: 03/16/2016 1:25:57 PM By: Christin Fudge MD, FACS Entered By: Christin Fudge on 03/16/2016 13:25:57 [place],  CARLEE GALLENSTEIN ([id number]) -------------------------------------------------------------------------------- Physician Orders Details Patient Name: [name], [name] Date of Service: 03/16/2016 12:45 PM Medical Record Number: [id number] Patient Account Number: [id number] Date of Birth/Sex: 05/18/1924 (82 y.o. Male) Treating RN: Baruch Gouty, RN, BSN, Velva Harman Primary Care Physician: [name] Other Clinician: Referring Physician: [name] Treating Physician/Extender: Frann Rider in Treatment: 1 Verbal / Phone Orders: Yes Clinician: Afful, RN, BSN, Rita Read Back and Verified: Yes Diagnosis Coding Wound Cleansing Wound #1 Left,Medial Malleolus o Cleanse wound with mild soap and water - in clinic o May shower with protection. - use big trash bag or a cast protector o No tub bath. Anesthetic Wound #1 Left,Medial Malleolus o Topical Lidocaine 4% cream applied to wound bed prior to debridement - for clinic use Skin Barriers/Peri-Wound Care Wound #1 Left,Medial Malleolus o Barrier cream - Desitin Primary Wound Dressing Wound #1 Left,Medial Malleolus o Aquacel Ag Secondary Dressing Wound #1 Left,Medial Malleolus o ABD pad Dressing Change Frequency Wound #1 Left,Medial Malleolus o Change dressing every week Follow-up Appointments Wound #1 Left,Medial Malleolus o Return Appointment in 1 week. Edema Control Wound #1 Left,Medial Malleolus o 3  Layer Compression System - Left Lower Extremity - unna to anchor [name], [name] ([id number]) Additional Orders / Instructions Wound #1 Left,Medial Malleolus o Increase protein intake. o Activity as tolerated Electronic Signature(s) Signed: 03/16/2016 4:38:34 PM By: Christin Fudge MD, FACS Signed: 03/16/2016 5:32:33 PM By: Regan Lemming BSN, RN Entered By: Regan Lemming on 03/16/2016 13:18:49 [place] ([id number]) -------------------------------------------------------------------------------- Problem List Details Patient Name: Every, Riggins L. Date of Service: 03/16/2016 12:45 PM Medical Record Number: [id number] Patient Account Number: [id number] Date of Birth/Sex: 01/23/1924 (82 y.o. Male) Treating RN: Ahmed Prima Primary Care Physician: [name] Other Clinician: Referring Physician: [name] Treating Physician/Extender: Frann Rider in Treatment: 1 Active Problems ICD-10 Encounter Code Description Active Date Diagnosis D45 Polycythemia vera 03/09/2016 Yes I87.032 Postthrombotic syndrome with ulcer and inflammation of 03/09/2016 Yes left lower extremity L97.322 Non-pressure chronic ulcer of left ankle with fat layer 03/09/2016 Yes exposed L97.321 Non-pressure chronic ulcer of left ankle limited to 03/16/2016 Yes breakdown of skin Inactive Problems Resolved Problems Electronic Signature(s) Signed: 03/16/2016 1:24:27 PM By: Christin Fudge MD, FACS Entered By: Christin Fudge on 03/16/2016 13:24:27 Elbaum, Jaymes Graff ([id number]) -------------------------------------------------------------------------------- Progress Note Details Patient Name: Willetts, Tryton L. Date of Service: 03/16/2016 12:45 PM Medical Record Number: [id number] Patient Account Number: [id number] Date of Birth/Sex: 12/17/1923 (82 y.o. Male) Treating RN: Ahmed Prima Primary Care Physician: [name] Other Clinician: Referring Physician: [name] Treating  Physician/Extender: Frann Rider in Treatment: 1 Subjective Chief Complaint Information obtained from Patient Patient presents for treatment of an open ulcer due to venous insufficiency to the left ankle which she's had for about a month History of Present Illness (HPI) The following HPI elements were documented for the patient's wound: Location: noticed an ulcer on the left ankle about a month Quality: Patient reports experiencing a dull pain to affected area(s). Severity: Patient states wound are getting worse. Duration: Patient has had the wound for < 4 weeks prior to presenting for treatment Timing: Pain in wound is Intermittent (comes and goes Context: The wound appeared gradually over time Modifying Factors: Other treatment(s) tried include:doxycycline on 2 different occasions and a light compression wrap Associated Signs and Symptoms: Patient reports having increase swelling. 82 year old gentleman with a past medical history of hypertension, polycythemia, diverticulitis, left lower extremity venous ulcer. He has never been a smoker. As recently seen by his PCP  Dr. Dagmar Hait, for venous stasis edema with ulcer on the left lower extremity and has completed the second course of doxycycline. Is also advised duoderm and wrapping the leg with Kling and Coban. 03/16/2016 -- not yet received his vascular appointment for venous duplex studies. Due to his original wound he now had a blister which has opened up to a superficial ulceration Objective Constitutional Pulse regular. Respirations normal and unlabored. Afebrile. Vitals Time Taken: 12:50 PM, Height: 71 in, Weight: 185 lbs, BMI: 25.8, Temperature: 97.7 F, Pulse: 79 bpm, Respiratory Rate: 20 breaths/min, Blood Pressure: 141/68 mmHg. [place] ([id number]) Eyes Nonicteric. Reactive to light. Ears, Nose, Mouth, and Throat Lips, teeth, and gums WNL.Marland Kitchen Moist mucosa without lesions. Neck supple and nontender. No palpable  supraclavicular or cervical adenopathy. Normal sized without goiter. Respiratory WNL. No retractions.. Breath sounds WNL, No rubs, rales, rhonchi, or wheeze.. Cardiovascular Heart rhythm and rate regular, no murmur or gallop.. Pedal Pulses WNL. No clubbing, cyanosis or edema. Lymphatic No adneopathy. No adenopathy. No adenopathy. Musculoskeletal Adexa without tenderness or enlargement.. Digits and nails w/o clubbing, cyanosis, infection, petechiae, ischemia, or inflammatory conditions.Marland Kitchen Psychiatric Judgement and insight Intact.. No evidence of depression, anxiety, or agitation.. General Notes: the original ulcer continues to have minimal debris which was cleaned out with moist saline gauze. Superior to this he has a superficial breakdown of skin possibly due to a blister Integumentary (Hair, Skin) No suspicious lesions. No crepitus or fluctuance. No peri-wound warmth or erythema. No masses.. Wound #1 status is Open. Original cause of wound was Gradually Appeared. The wound is located on the Left,Medial Malleolus. The wound measures 1.1cm length x 1cm width x 0.2cm depth; 0.864cm^2 area and 0.173cm^3 volume. The wound is limited to skin breakdown. There is no tunneling or undermining noted. There is a large amount of serosanguineous drainage noted. The wound margin is distinct with the outline attached to the wound base. There is medium (34-66%) pink granulation within the wound bed. There is a medium (34-66%) amount of necrotic tissue within the wound bed including Adherent Slough. The periwound skin appearance exhibited: Moist, Erythema. The surrounding wound skin color is noted with erythema which is circumferential. Periwound temperature was noted as No Abnormality. The periwound has tenderness on palpation. Wound #2 status is Open. Original cause of wound was Gradually Appeared. The wound is located on the Left,Medial Lower Leg. The wound measures 1.3cm length x 2.5cm width x 0.1cm  depth; 2.553cm^2 area and 0.255cm^3 volume. The wound is limited to skin breakdown. There is no tunneling or undermining noted. There is a large amount of serosanguineous drainage noted. The wound margin is distinct with the outline attached to the wound base. There is large (67-100%) red granulation within the wound bed. There is a small (1-33%) amount of necrotic tissue within the wound bed including Adherent Slough. The periwound skin appearance exhibited: Moist. Periwound temperature was noted as No Abnormality. The [place] ([id number]) periwound has tenderness on palpation. Assessment Active Problems ICD-10 D45 - Polycythemia vera I87.032 - Postthrombotic syndrome with ulcer and inflammation of left lower extremity L97.322 - Non-pressure chronic ulcer of left ankle with fat layer exposed L97.321 - Non-pressure chronic ulcer of left ankle limited to breakdown of skin Plan Wound Cleansing: Wound #1 Left,Medial Malleolus: Cleanse wound with mild soap and water - in clinic May shower with protection. - use big trash bag or a cast protector No tub bath. Anesthetic: Wound #1 Left,Medial Malleolus: Topical Lidocaine 4% cream applied to wound bed prior  to debridement - for clinic use Skin Barriers/Peri-Wound Care: Wound #1 Left,Medial Malleolus: Barrier cream - Desitin Primary Wound Dressing: Wound #1 Left,Medial Malleolus: Aquacel Ag Secondary Dressing: Wound #1 Left,Medial Malleolus: ABD pad Dressing Change Frequency: Wound #1 Left,Medial Malleolus: Change dressing every week Follow-up Appointments: Wound #1 Left,Medial Malleolus: Return Appointment in 1 week. Edema Control: Wound #1 Left,Medial Malleolus: 3 Layer Compression System - Left Lower Extremity - unna to anchor Additional Orders / Instructions: Wound #1 Left,Medial Malleolus: Bram, Nnaemeka L. ([id number]) Increase protein intake. Activity as tolerated I have recommended: 1. Silver alginate and a 3  layer Profore lite 2. Venous reflux studies to be done 3. Elevation and exercise. 4. review at weekly intervals. Electronic Signature(s) Signed: 03/16/2016 1:26:27 PM By: Christin Fudge MD, FACS Entered By: Christin Fudge on 03/16/2016 13:26:26 [place], Jaymes Graff ([id number]) -------------------------------------------------------------------------------- SuperBill Details Patient Name: Graciella Freer, Vernell L. Date of Service: 03/16/2016 Medical Record Number: [id number] Patient Account Number: [id number] Date of Birth/Sex: 1923/09/19 (82 y.o. 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 exposed L97.321 Non-pressure chronic ulcer of left ankle limited to breakdown of skin Facility Procedures CPT4 Code: [id number] Description: 99213 - WOUND CARE VISIT-LEV 3 EST PT Modifier: Quantity: 1 Physician Procedures CPT4: Description Modifier Quantity Code [id number] 99213 - WC PHYS LEVEL 3 - EST PT 1 ICD-10 Description Diagnosis D45 Polycythemia vera I87.032 Postthrombotic syndrome with ulcer and inflammation of left lower extremity L97.322 Non-pressure chronic ulcer  of left ankle with fat layer exposed L97.321 Non-pressure chronic ulcer of left ankle limited to breakdown of skin Electronic Signature(s) Signed: 03/16/2016 4:56:02 PM By: Regan Lemming BSN, RN Previous Signature: 03/16/2016 1:26:42 PM Version By: Christin Fudge MD, FACS Entered By: Regan Lemming on 03/16/2016 16:56:02

## 2016-03-21 DIAGNOSIS — Z23 Encounter for immunization: Secondary | ICD-10-CM | POA: Diagnosis not present

## 2016-03-22 ENCOUNTER — Other Ambulatory Visit: Payer: Self-pay | Admitting: Vascular Surgery

## 2016-03-22 DIAGNOSIS — L97322 Non-pressure chronic ulcer of left ankle with fat layer exposed: Secondary | ICD-10-CM

## 2016-03-23 ENCOUNTER — Encounter (HOSPITAL_BASED_OUTPATIENT_CLINIC_OR_DEPARTMENT_OTHER): Payer: Medicare Other | Attending: Internal Medicine

## 2016-03-23 DIAGNOSIS — I10 Essential (primary) hypertension: Secondary | ICD-10-CM | POA: Diagnosis not present

## 2016-03-23 DIAGNOSIS — L97221 Non-pressure chronic ulcer of left calf limited to breakdown of skin: Secondary | ICD-10-CM | POA: Insufficient documentation

## 2016-03-23 DIAGNOSIS — I82412 Acute embolism and thrombosis of left femoral vein: Secondary | ICD-10-CM | POA: Insufficient documentation

## 2016-03-23 DIAGNOSIS — D45 Polycythemia vera: Secondary | ICD-10-CM | POA: Diagnosis not present

## 2016-03-23 DIAGNOSIS — I87332 Chronic venous hypertension (idiopathic) with ulcer and inflammation of left lower extremity: Secondary | ICD-10-CM | POA: Diagnosis not present

## 2016-03-23 DIAGNOSIS — L97321 Non-pressure chronic ulcer of left ankle limited to breakdown of skin: Secondary | ICD-10-CM | POA: Diagnosis not present

## 2016-03-23 DIAGNOSIS — I70242 Atherosclerosis of native arteries of left leg with ulceration of calf: Secondary | ICD-10-CM | POA: Diagnosis not present

## 2016-03-23 DIAGNOSIS — I70243 Atherosclerosis of native arteries of left leg with ulceration of ankle: Secondary | ICD-10-CM | POA: Diagnosis not present

## 2016-03-29 DIAGNOSIS — L97821 Non-pressure chronic ulcer of other part of left lower leg limited to breakdown of skin: Secondary | ICD-10-CM | POA: Diagnosis not present

## 2016-03-29 DIAGNOSIS — I82412 Acute embolism and thrombosis of left femoral vein: Secondary | ICD-10-CM | POA: Diagnosis not present

## 2016-03-29 DIAGNOSIS — I10 Essential (primary) hypertension: Secondary | ICD-10-CM | POA: Diagnosis not present

## 2016-03-29 DIAGNOSIS — I87332 Chronic venous hypertension (idiopathic) with ulcer and inflammation of left lower extremity: Secondary | ICD-10-CM | POA: Diagnosis not present

## 2016-03-29 DIAGNOSIS — L97221 Non-pressure chronic ulcer of left calf limited to breakdown of skin: Secondary | ICD-10-CM | POA: Diagnosis not present

## 2016-03-29 DIAGNOSIS — I739 Peripheral vascular disease, unspecified: Secondary | ICD-10-CM | POA: Diagnosis not present

## 2016-03-29 DIAGNOSIS — D45 Polycythemia vera: Secondary | ICD-10-CM | POA: Diagnosis not present

## 2016-03-29 DIAGNOSIS — L97321 Non-pressure chronic ulcer of left ankle limited to breakdown of skin: Secondary | ICD-10-CM | POA: Diagnosis not present

## 2016-04-03 ENCOUNTER — Encounter: Payer: Self-pay | Admitting: Vascular Surgery

## 2016-04-05 ENCOUNTER — Ambulatory Visit (HOSPITAL_COMMUNITY)
Admission: RE | Admit: 2016-04-05 | Discharge: 2016-04-05 | Disposition: A | Discharge status: Home / Self Care | Payer: Medicare Other | Source: Ambulatory Visit | Attending: Vascular Surgery | Admitting: Vascular Surgery

## 2016-04-05 DIAGNOSIS — I82512 Chronic embolism and thrombosis of left femoral vein: Secondary | ICD-10-CM | POA: Insufficient documentation

## 2016-04-05 DIAGNOSIS — I8392 Asymptomatic varicose veins of left lower extremity: Secondary | ICD-10-CM | POA: Insufficient documentation

## 2016-04-05 DIAGNOSIS — I82412 Acute embolism and thrombosis of left femoral vein: Secondary | ICD-10-CM | POA: Diagnosis not present

## 2016-04-05 DIAGNOSIS — I87332 Chronic venous hypertension (idiopathic) with ulcer and inflammation of left lower extremity: Secondary | ICD-10-CM | POA: Diagnosis not present

## 2016-04-05 DIAGNOSIS — I10 Essential (primary) hypertension: Secondary | ICD-10-CM | POA: Diagnosis not present

## 2016-04-05 DIAGNOSIS — L97822 Non-pressure chronic ulcer of other part of left lower leg with fat layer exposed: Secondary | ICD-10-CM | POA: Diagnosis not present

## 2016-04-05 DIAGNOSIS — L97322 Non-pressure chronic ulcer of left ankle with fat layer exposed: Secondary | ICD-10-CM

## 2016-04-05 DIAGNOSIS — I739 Peripheral vascular disease, unspecified: Secondary | ICD-10-CM | POA: Diagnosis not present

## 2016-04-05 DIAGNOSIS — L97221 Non-pressure chronic ulcer of left calf limited to breakdown of skin: Secondary | ICD-10-CM | POA: Diagnosis not present

## 2016-04-05 DIAGNOSIS — D45 Polycythemia vera: Secondary | ICD-10-CM | POA: Diagnosis not present

## 2016-04-05 DIAGNOSIS — R609 Edema, unspecified: Secondary | ICD-10-CM | POA: Diagnosis present

## 2016-04-06 ENCOUNTER — Other Ambulatory Visit (HOSPITAL_BASED_OUTPATIENT_CLINIC_OR_DEPARTMENT_OTHER): Payer: Medicare Other

## 2016-04-06 ENCOUNTER — Ambulatory Visit (HOSPITAL_BASED_OUTPATIENT_CLINIC_OR_DEPARTMENT_OTHER): Payer: Medicare Other

## 2016-04-06 VITALS — BP 115/53 | HR 83 | Temp 97.6°F | Resp 18

## 2016-04-06 DIAGNOSIS — D45 Polycythemia vera: Secondary | ICD-10-CM

## 2016-04-06 LAB — CBC WITH DIFFERENTIAL/PLATELET
BASO%: 1.2 % (ref 0.0–2.0)
BASOS ABS: 0.3 10*3/uL — AB (ref 0.0–0.1)
EOS ABS: 0.2 10*3/uL (ref 0.0–0.5)
EOS%: 0.9 % (ref 0.0–7.0)
HCT: 45.8 % (ref 38.4–49.9)
HEMOGLOBIN: 14.2 g/dL (ref 13.0–17.1)
LYMPH#: 1.2 10*3/uL (ref 0.9–3.3)
LYMPH%: 5 % — ABNORMAL LOW (ref 14.0–49.0)
MCH: 27.6 pg (ref 27.2–33.4)
MCHC: 31 g/dL — ABNORMAL LOW (ref 32.0–36.0)
MCV: 89.1 fL (ref 79.3–98.0)
MONO#: 0.9 10*3/uL (ref 0.1–0.9)
MONO%: 3.6 % (ref 0.0–14.0)
NEUT%: 89.3 % — ABNORMAL HIGH (ref 39.0–75.0)
NEUTROS ABS: 21.1 10*3/uL — AB (ref 1.5–6.5)
NRBC: 0 % (ref 0–0)
PLATELETS: 114 10*3/uL — AB (ref 140–400)
RBC: 5.14 10*6/uL (ref 4.20–5.82)
RDW: 17 % — AB (ref 11.0–14.6)
WBC: 23.6 10*3/uL — AB (ref 4.0–10.3)

## 2016-04-06 LAB — COMPREHENSIVE METABOLIC PANEL
ALBUMIN: 3.5 g/dL (ref 3.5–5.0)
ALK PHOS: 83 U/L (ref 40–150)
ALT: 19 U/L (ref 0–55)
ANION GAP: 10 meq/L (ref 3–11)
AST: 26 U/L (ref 5–34)
BILIRUBIN TOTAL: 0.59 mg/dL (ref 0.20–1.20)
BUN: 33 mg/dL — ABNORMAL HIGH (ref 7.0–26.0)
CO2: 22 meq/L (ref 22–29)
CREATININE: 1.2 mg/dL (ref 0.7–1.3)
Calcium: 8.8 mg/dL (ref 8.4–10.4)
Chloride: 106 mEq/L (ref 98–109)
EGFR: 51 mL/min/{1.73_m2} — AB (ref >90)
GLUCOSE: 133 mg/dL (ref 70–140)
Potassium: 5.2 mEq/L — ABNORMAL HIGH (ref 3.5–5.1)
SODIUM: 138 meq/L (ref 136–145)
TOTAL PROTEIN: 6.2 g/dL — AB (ref 6.4–8.3)

## 2016-04-06 NOTE — Patient Instructions (Signed)

## 2016-04-06 NOTE — Progress Notes (Signed)
Per Dr. Ernestina Penna note on 03/09/16 Phlebotomy to be performed for hematocrit over 45% Today's hematocrit 45.8. Therapeutic Phlebotomy performed by Andree Coss RN per MD order using 16 gauge phlebotomy set using left AC. Starting at 1351 and ending at 1356 removing 540 grams of blood.  Pt tolerated well and snack provided. and to be observed for 30 minutes.   1426:pt and VS stable and discharged at this time.

## 2016-04-07 ENCOUNTER — Encounter: Payer: Self-pay | Admitting: Vascular Surgery

## 2016-04-07 ENCOUNTER — Ambulatory Visit (INDEPENDENT_AMBULATORY_CARE_PROVIDER_SITE_OTHER): Payer: Medicare Other | Admitting: Vascular Surgery

## 2016-04-07 VITALS — BP 136/84 | HR 89 | Temp 99.0°F | Resp 18 | Ht 70.0 in | Wt 183.0 lb

## 2016-04-07 DIAGNOSIS — I872 Venous insufficiency (chronic) (peripheral): Secondary | ICD-10-CM | POA: Diagnosis not present

## 2016-04-07 NOTE — Progress Notes (Signed)
Patient ID: [name] [name], male   DOB: 29-Aug-1923, 82 y.o.   MRN: [id number]  Reason for Consult: New Evaluation (nonhealing wound on left ankle)   Referred by [name], MD  Subjective:     HPI:  [name] [name] is a 82 y.o. male presents with a left medial malleolar ulcer. This ulcer has been present for greater than one month and he now has new ulceration next to it from bandage placed in the wound care center. He does have a history of a DVT as well as polycythemia which he takes Xarelto. He does have pain in his leg associated with the wound that is nonradiating exacerbated by activity without relieving factors. He also has swelling of his contralateral leg but is unable to wear compression stockings on that leg as the difficulty with putting him on a is without help at home. Currently has weekly dressing changes at the wound care center that he has been adherent to for now 1 month.   Past Medical History:  Diagnosis Date  . Bone marrow disease   . BPH (benign prostatic hypertrophy)   . Depression   . DJD (degenerative joint disease)   . Dyslipidemia   . Gout   . HTN (hypertension)   . Mild aortic stenosis   . Polycythemia vera(238.4)    Family History  Problem Relation Age of Onset  . Hypertension Mother    Past Surgical History:  Procedure Laterality Date  . HEMORROIDECTOMY      Short Social History:  Social History  Substance Use Topics  . Smoking status: Never Smoker  . Smokeless tobacco: Never Used  . Alcohol use 4.2 oz/week    7 Glasses of wine per week     Comment: red wine    Allergies  Allergen Reactions  . Amoxicillin Swelling    Bilateral  Hands  Swelling.  . Colchicine     Hands swell up, and hot    Current Outpatient Prescriptions  Medication Sig Dispense Refill  . acetaminophen (TYLENOL) 325 MG tablet Take 650 mg by mouth every 8 (eight) hours as needed.    Marland Kitchen allopurinol (ZYLOPRIM) 100 MG tablet Take 200 mg by mouth daily.    Marland Kitchen  ALPRAZolam (XANAX) 0.5 MG tablet Take 0.5 mg by mouth as needed.    Marland Kitchen escitalopram (LEXAPRO) 10 MG tablet Take 5 mg by mouth daily.     . fish oil-omega-3 fatty acids 1000 MG capsule Take 1 g by mouth daily.    . hydroxyurea (HYDREA) 500 MG capsule Take 500 mg by mouth as directed. May take with food to minimize GI side effects.   Take 500 mg  Daily  For  5 days  -  T, W, Th, Sat, Sun.    . lisinopril (PRINIVIL,ZESTRIL) 10 MG tablet Take 10 mg by mouth daily.    . Multiple Vitamin (MULTIVITAMIN) tablet Take 1 tablet by mouth daily.    . rivaroxaban (XARELTO) 20 MG TABS tablet Take 20 mg by mouth daily with supper.    . terazosin (HYTRIN) 5 MG capsule Take 5 mg by mouth at bedtime.    . vitamin E (VITAMIN E) 400 UNIT capsule Take 400 Units by mouth daily.     No current facility-administered medications for this visit.     Review of Systems  Constitutional:  Constitutional negative. HENT: HENT negative.  Eyes: Eyes negative.  Respiratory: Respiratory negative.  Cardiovascular: Cardiovascular negative.  GI: Gastrointestinal negative.  Musculoskeletal: Musculoskeletal negative.  Skin: Positive for wound.  Neurological: Neurological negative. Hematologic: Negative for adenopathy.      Polycythemia vera Psychiatric: Psychiatric negative.        Objective:  Objective   Vitals:   04/07/16 1405  BP: 136/84  Pulse: 89  Resp: 18  Temp: 99 F (37.2 C)  SpO2: 96%  Weight: 183 lb (83 kg)  Height: 5\' 10"  (1.778 m)   Body mass index is 26.26 kg/m.  Physical Exam  Constitutional: He is oriented to person, place, and time. He appears well-developed.  Eyes: EOM are normal.  Neck: Normal range of motion.  Cardiovascular: Normal rate.   Pulses:      Radial pulses are 2+ on the right side, and 2+ on the left side.       Femoral pulses are 2+ on the right side, and 2+ on the left side.      Popliteal pulses are 2+ on the right side, and 2+ on the left side.       Dorsalis pedis  pulses are 2+ on the right side, and 2+ on the left side.       Posterior tibial pulses are 2+ on the right side, and 2+ on the left side.  Pulmonary/Chest: Effort normal.  Abdominal: Soft.  Musculoskeletal:  Varicose veins and pitting edema of lle  Neurological: He is alert and oriented to person, place, and time. He has normal reflexes.  Skin:  Darkened discoloration of left leg below knee 2- 1cm area of ulceration left medial ankle  Psychiatric: He has a normal mood and affect. His behavior is normal. Judgment and thought content normal.    Data: Saphenofemoral junction and she has the reflux greater than 500 ms. GSC at saphenofemoral junction 0.93 cm.      Assessment/Plan:  Very healthy 82 year old white male with C6 venous disease currently undergoing treatment at the wound care center for the past 4 weeks with notable reflux in his large [place] throughout the left lower extremity presence of varicose veins as well as lipoma dermatosclerosis to suggest chronic disease. Given this disease will have him see Dr. Kellie Simmering in the wound clinic in 1 month for consideration of greater saphenous vein ablation on the left. He is maintained on Xarelto for his polycythemia vera and we'll continue the wound clinic with compressive dressing weekly.     Waynetta Sandy MD [facility]

## 2016-04-12 DIAGNOSIS — I87332 Chronic venous hypertension (idiopathic) with ulcer and inflammation of left lower extremity: Secondary | ICD-10-CM | POA: Diagnosis not present

## 2016-04-12 DIAGNOSIS — I739 Peripheral vascular disease, unspecified: Secondary | ICD-10-CM | POA: Diagnosis not present

## 2016-04-12 DIAGNOSIS — D45 Polycythemia vera: Secondary | ICD-10-CM | POA: Diagnosis not present

## 2016-04-12 DIAGNOSIS — I82412 Acute embolism and thrombosis of left femoral vein: Secondary | ICD-10-CM | POA: Diagnosis not present

## 2016-04-12 DIAGNOSIS — L97822 Non-pressure chronic ulcer of other part of left lower leg with fat layer exposed: Secondary | ICD-10-CM | POA: Diagnosis not present

## 2016-04-12 DIAGNOSIS — L97221 Non-pressure chronic ulcer of left calf limited to breakdown of skin: Secondary | ICD-10-CM | POA: Diagnosis not present

## 2016-04-12 DIAGNOSIS — I10 Essential (primary) hypertension: Secondary | ICD-10-CM | POA: Diagnosis not present

## 2016-04-12 DIAGNOSIS — L97322 Non-pressure chronic ulcer of left ankle with fat layer exposed: Secondary | ICD-10-CM | POA: Diagnosis not present

## 2016-04-18 ENCOUNTER — Encounter (HOSPITAL_BASED_OUTPATIENT_CLINIC_OR_DEPARTMENT_OTHER): Payer: Medicare Other | Attending: Surgery

## 2016-04-18 DIAGNOSIS — I10 Essential (primary) hypertension: Secondary | ICD-10-CM | POA: Insufficient documentation

## 2016-04-18 DIAGNOSIS — L97221 Non-pressure chronic ulcer of left calf limited to breakdown of skin: Secondary | ICD-10-CM | POA: Insufficient documentation

## 2016-04-18 DIAGNOSIS — I83023 Varicose veins of left lower extremity with ulcer of ankle: Secondary | ICD-10-CM | POA: Insufficient documentation

## 2016-04-18 DIAGNOSIS — I83222 Varicose veins of left lower extremity with both ulcer of calf and inflammation: Secondary | ICD-10-CM | POA: Insufficient documentation

## 2016-04-18 DIAGNOSIS — L97321 Non-pressure chronic ulcer of left ankle limited to breakdown of skin: Secondary | ICD-10-CM | POA: Insufficient documentation

## 2016-04-18 DIAGNOSIS — D751 Secondary polycythemia: Secondary | ICD-10-CM | POA: Insufficient documentation

## 2016-04-19 DIAGNOSIS — I83222 Varicose veins of left lower extremity with both ulcer of calf and inflammation: Secondary | ICD-10-CM | POA: Diagnosis not present

## 2016-04-19 DIAGNOSIS — L97821 Non-pressure chronic ulcer of other part of left lower leg limited to breakdown of skin: Secondary | ICD-10-CM | POA: Diagnosis not present

## 2016-04-19 DIAGNOSIS — I70243 Atherosclerosis of native arteries of left leg with ulceration of ankle: Secondary | ICD-10-CM | POA: Diagnosis not present

## 2016-04-19 DIAGNOSIS — I70248 Atherosclerosis of native arteries of left leg with ulceration of other part of lower left leg: Secondary | ICD-10-CM | POA: Diagnosis not present

## 2016-04-19 DIAGNOSIS — I87332 Chronic venous hypertension (idiopathic) with ulcer and inflammation of left lower extremity: Secondary | ICD-10-CM | POA: Diagnosis not present

## 2016-04-19 DIAGNOSIS — L97221 Non-pressure chronic ulcer of left calf limited to breakdown of skin: Secondary | ICD-10-CM | POA: Diagnosis not present

## 2016-04-19 DIAGNOSIS — I10 Essential (primary) hypertension: Secondary | ICD-10-CM | POA: Diagnosis not present

## 2016-04-19 DIAGNOSIS — D751 Secondary polycythemia: Secondary | ICD-10-CM | POA: Diagnosis not present

## 2016-04-19 DIAGNOSIS — I83023 Varicose veins of left lower extremity with ulcer of ankle: Secondary | ICD-10-CM | POA: Diagnosis not present

## 2016-04-19 DIAGNOSIS — L97321 Non-pressure chronic ulcer of left ankle limited to breakdown of skin: Secondary | ICD-10-CM | POA: Diagnosis not present

## 2016-04-25 DIAGNOSIS — D751 Secondary polycythemia: Secondary | ICD-10-CM | POA: Diagnosis not present

## 2016-04-25 DIAGNOSIS — L97221 Non-pressure chronic ulcer of left calf limited to breakdown of skin: Secondary | ICD-10-CM | POA: Diagnosis not present

## 2016-04-25 DIAGNOSIS — I83023 Varicose veins of left lower extremity with ulcer of ankle: Secondary | ICD-10-CM | POA: Diagnosis not present

## 2016-04-25 DIAGNOSIS — L97321 Non-pressure chronic ulcer of left ankle limited to breakdown of skin: Secondary | ICD-10-CM | POA: Diagnosis not present

## 2016-04-25 DIAGNOSIS — Z872 Personal history of diseases of the skin and subcutaneous tissue: Secondary | ICD-10-CM | POA: Diagnosis not present

## 2016-04-25 DIAGNOSIS — I83222 Varicose veins of left lower extremity with both ulcer of calf and inflammation: Secondary | ICD-10-CM | POA: Diagnosis not present

## 2016-04-25 DIAGNOSIS — I10 Essential (primary) hypertension: Secondary | ICD-10-CM | POA: Diagnosis not present

## 2016-04-25 DIAGNOSIS — Z09 Encounter for follow-up examination after completed treatment for conditions other than malignant neoplasm: Secondary | ICD-10-CM | POA: Diagnosis not present

## 2016-04-26 DIAGNOSIS — Z6826 Body mass index (BMI) 26.0-26.9, adult: Secondary | ICD-10-CM | POA: Diagnosis not present

## 2016-04-26 DIAGNOSIS — R7302 Impaired glucose tolerance (oral): Secondary | ICD-10-CM | POA: Diagnosis not present

## 2016-04-26 DIAGNOSIS — I8290 Acute embolism and thrombosis of unspecified vein: Secondary | ICD-10-CM | POA: Diagnosis not present

## 2016-04-26 DIAGNOSIS — D45 Polycythemia vera: Secondary | ICD-10-CM | POA: Diagnosis not present

## 2016-04-26 DIAGNOSIS — M1990 Unspecified osteoarthritis, unspecified site: Secondary | ICD-10-CM | POA: Diagnosis not present

## 2016-04-26 DIAGNOSIS — I87319 Chronic venous hypertension (idiopathic) with ulcer of unspecified lower extremity: Secondary | ICD-10-CM | POA: Diagnosis not present

## 2016-04-26 DIAGNOSIS — I10 Essential (primary) hypertension: Secondary | ICD-10-CM | POA: Diagnosis not present

## 2016-05-04 ENCOUNTER — Encounter: Payer: Self-pay | Admitting: Hematology

## 2016-05-04 ENCOUNTER — Other Ambulatory Visit (HOSPITAL_BASED_OUTPATIENT_CLINIC_OR_DEPARTMENT_OTHER): Payer: Medicare Other

## 2016-05-04 ENCOUNTER — Ambulatory Visit (HOSPITAL_BASED_OUTPATIENT_CLINIC_OR_DEPARTMENT_OTHER): Payer: Medicare Other | Admitting: Hematology

## 2016-05-04 ENCOUNTER — Telehealth: Payer: Self-pay | Admitting: Hematology

## 2016-05-04 VITALS — BP 145/63 | HR 93 | Temp 97.8°F | Resp 18 | Ht 70.0 in | Wt 186.8 lb

## 2016-05-04 DIAGNOSIS — D45 Polycythemia vera: Secondary | ICD-10-CM

## 2016-05-04 DIAGNOSIS — I824Y3 Acute embolism and thrombosis of unspecified deep veins of proximal lower extremity, bilateral: Secondary | ICD-10-CM

## 2016-05-04 DIAGNOSIS — L819 Disorder of pigmentation, unspecified: Secondary | ICD-10-CM

## 2016-05-04 DIAGNOSIS — L538 Other specified erythematous conditions: Secondary | ICD-10-CM | POA: Diagnosis not present

## 2016-05-04 LAB — CBC WITH DIFFERENTIAL/PLATELET
BASO%: 1.3 % (ref 0.0–2.0)
Basophils Absolute: 0.3 10e3/uL — ABNORMAL HIGH (ref 0.0–0.1)
EOS%: 1 % (ref 0.0–7.0)
Eosinophils Absolute: 0.2 10e3/uL (ref 0.0–0.5)
HCT: 44 % (ref 38.4–49.9)
HGB: 13.5 g/dL (ref 13.0–17.1)
LYMPH%: 4.9 % — ABNORMAL LOW (ref 14.0–49.0)
MCH: 27.7 pg (ref 27.2–33.4)
MCHC: 30.7 g/dL — ABNORMAL LOW (ref 32.0–36.0)
MCV: 90.3 fL (ref 79.3–98.0)
MONO#: 0.8 10e3/uL (ref 0.1–0.9)
MONO%: 3.3 % (ref 0.0–14.0)
NEUT#: 20.4 10e3/uL — ABNORMAL HIGH (ref 1.5–6.5)
NEUT%: 89.5 % — ABNORMAL HIGH (ref 39.0–75.0)
Platelets: 128 10e3/uL — ABNORMAL LOW (ref 140–400)
RBC: 4.87 10e6/uL (ref 4.20–5.82)
RDW: 16.4 % — ABNORMAL HIGH (ref 11.0–14.6)
WBC: 22.8 10e3/uL — ABNORMAL HIGH (ref 4.0–10.3)
lymph#: 1.1 10e3/uL (ref 0.9–3.3)
nRBC: 0 % (ref 0–0)

## 2016-05-04 LAB — COMPREHENSIVE METABOLIC PANEL
ALT: 14 U/L (ref 0–55)
ANION GAP: 10 meq/L (ref 3–11)
AST: 25 U/L (ref 5–34)
Albumin: 3.6 g/dL (ref 3.5–5.0)
Alkaline Phosphatase: 88 U/L (ref 40–150)
BUN: 27.7 mg/dL — ABNORMAL HIGH (ref 7.0–26.0)
CALCIUM: 8.7 mg/dL (ref 8.4–10.4)
CHLORIDE: 106 meq/L (ref 98–109)
CO2: 23 meq/L (ref 22–29)
Creatinine: 1.2 mg/dL (ref 0.7–1.3)
EGFR: 52 mL/min/{1.73_m2} — ABNORMAL LOW (ref >90)
Glucose: 145 mg/dl — ABNORMAL HIGH (ref 70–140)
POTASSIUM: 4.5 meq/L (ref 3.5–5.1)
Sodium: 139 mEq/L (ref 136–145)
Total Bilirubin: 0.48 mg/dL (ref 0.20–1.20)
Total Protein: 6.1 g/dL — ABNORMAL LOW (ref 6.4–8.3)

## 2016-05-04 LAB — TECHNOLOGIST REVIEW

## 2016-05-04 NOTE — Progress Notes (Signed)
[facility] OFFICE PROGRESS NOTE  Tivis Ringer, MD [place] [address]  DIAGNOSIS: Polycythemia vera ([place])  Deep vein thrombosis (DVT) of proximal vein of both lower extremities, unspecified chronicity (HCC)   CURRENT TREATMENT:   1. Phlebotomy and hydroxyurea 500 mg daily (since 2011) .  2.hydrea decreased to 500mg  daily 4-5 days/week since 02/2015  3. Xalreto 20 mg started about mid-February 2015 and off after 6 month, restarted again in 02/2015 due to left LE DVT  INTERVAL HISTORY: [name] [name] 82 y.o. male with a history of Polycythemia vera dating back to 1997, JAK2 mutation positive is here for follow up.  He is accompanied by his sister-in-law to the clinic today. His ulcers above his left ankle has finally healed, he was discharged from the wound clinic last week. He still has moderate skin erythema and pigmentation, mild swelling on bilateral feet, possible related to his venous stasis. No other new complaints. He is tolerating Hydrea well.   MEDICAL HISTORY: Past Medical History:  Diagnosis Date  . Bone marrow disease   . BPH (benign prostatic hypertrophy)   . Depression   . DJD (degenerative joint disease)   . Dyslipidemia   . Gout   . HTN (hypertension)   . Mild aortic stenosis   . Polycythemia vera(238.4)     INTERIM HISTORY: has Polycythemia vera ([place]); Aneurysm of unspecified site; DVT (deep venous thrombosis) ([place]); and Thrombocytopenia (HCC) on his problem list.    ALLERGIES:  is allergic to amoxicillin and colchicine.  MEDICATIONS: has a current medication list which includes the following prescription(s): acetaminophen, allopurinol, alprazolam, escitalopram, fish oil-omega-3 fatty acids, hydroxyurea, lisinopril, multivitamin, rivaroxaban, terazosin, and vitamin e.  SURGICAL HISTORY:  Past Surgical History:  Procedure Laterality Date  . HEMORROIDECTOMY      REVIEW OF SYSTEMS:   Constitutional: Denies fevers, chills  or abnormal weight loss Eyes: Denies blurriness of vision Ears, nose, mouth, throat, and face: Denies mucositis or sore throat Respiratory: Denies cough, dyspnea or wheezes Cardiovascular: Denies palpitation, chest discomfort or lower extremity swelling Gastrointestinal:  Denies nausea, heartburn or change in bowel habits Skin: Denies abnormal skin rashes Lymphatics: Denies new lymphadenopathy or easy bruising Neurological:Denies numbness, tingling or new weaknesses Behavioral/Psych: Mood is stable, no new changes  All other systems were reviewed with the patient and are negative.  PHYSICAL EXAMINATION: ECOG PERFORMANCE STATUS: 1  Blood pressure (!) 145/63, pulse 93, temperature 97.8 F (36.6 C), temperature source Oral, resp. rate 18, height 5\' 10"  (1.778 m), weight 186 lb 12.8 oz (84.7 kg), SpO2 96 %.  General: Easily ambulatory.  HEENT: Sclerae anicteric. Conjunctivae were pink. Pupils round and reactive bilaterally. Oral mucosa is moist without ulceration or thrush.  No occipital, submandibular, cervical, supraclavicular or axillar adenopathy.  Lungs: clear to auscultation without wheezes. No rales or rhonchi.  Heart: regular rate and rhythm. No gallop or rubs. 2/6 systolic murmur.  Abdomen: soft, non tender. No guarding or rebound tenderness. Bowel sounds are present. No palpable hepatosplenomegaly.  MSK: no focal spinal tenderness.  Extremities: No clubbing or cyanosis.No calf tenderness to palpitation, no peripheral edema. The patient had grossly intact strength in upper and lower extremities.  Skin exam showed small ecchymosis on right forearm and both hands, no petechiae or skin rashes, he has skin erythema and pigmentation on both feet and her around ankle. 2 small healed ulcer above the left ankle. Neuro: non-focal, alert and oriented to time, person and place, appropriate affect   Labs:  CBC Latest Ref Rng & Units 05/04/2016 04/06/2016 03/09/2016  WBC 4.0 - 10.3 10e3/uL  22.8(H) 23.6(H) 27.9(H)  Hemoglobin 13.0 - 17.1 g/dL 13.5 14.2 14.0  Hematocrit 38.4 - 49.9 % 44.0 45.8 45.1  Platelets 140 - 400 10e3/uL 128(L) 114(L) 145   CMP Latest Ref Rng & Units 05/04/2016 04/06/2016 01/13/2016  Glucose 70 - 140 mg/dl 145(H) 133 126  BUN 7.0 - 26.0 mg/dL 27.7(H) 33.0(H) 26.6(H)  Creatinine 0.7 - 1.3 mg/dL 1.2 1.2 1.3  Sodium 136 - 145 mEq/L 139 138 138  Potassium 3.5 - 5.1 mEq/L 4.5 5.2(H) 4.9  Chloride 98 - 107 mEq/L - - -  CO2 22 - 29 mEq/L 23 22 27   Calcium 8.4 - 10.4 mg/dL 8.7 8.8 8.9  Total Protein 6.4 - 8.3 g/dL 6.1(L) 6.2(L) 6.0(L)  Total Bilirubin 0.20 - 1.20 mg/dL 0.48 0.59 0.48  Alkaline Phos 40 - 150 U/L 88 83 81  AST 5 - 34 U/L 25 26 20   ALT 0 - 55 U/L 14 19 15     RADIOGRAPHIC STUDIES: No results found.  ASSESSMENT: [name] [name] 82 y.o. male with a history of Polycythemia vera ([place])  Deep vein thrombosis (DVT) of proximal vein of both lower extremities, unspecified chronicity ([place])   PLAN:  1. Polycythemia vera diagnosed in 1997, JAK2 mutation positive.  -We reviewed the nature history of PV, most people do very well, some people would develop myelofibrosis or leukemia late on. The major complication is thrombosis. -He has been on Hydrea 500 mg daily for 4-5 years, due to the worsening thrombocytopenia, I decreased to 500 mg 4 days a week (M, W, F, Sat and Sun) -His thrombocytopenia has resolved, but his WBC has increased to 20K's since we decreased his Hydrea. H/H also slightly increased. So I increased his Hydrea to 500 mg daily except Monday and Friday. -Lab reviewed, hematocrit 44%, no need for phlebotomy today -His skin ulcer in the lower extremity has healed -he will continue Hydrea 500 mg daily except Monday and Friday  -He is on Xarelto now.  2 RLE DVT, LLE DVT in 02/2015, likely secondary to #1 -I recommend him to continue Xarelto indefinitely, due to recurrent DVT. -He knows to be careful about fall and injury  3. Left ankle  wound -healed now   4. Feet and ankle skin erythema and pigmentation, likely segmented to venous stasis -he will follow up with his vascular surgeon   PLAN:  -No phlebotomy today -continue Hydrea to 500 mg 5 days a week -CBC every 2 month and plembotomy if HCT>45% -Return to clinic in 4 months   All questions were answered. The patient knows to call the clinic with any problems, questions or concerns. We can certainly see the patient much sooner if necessary.  I spent 15 minutes counseling the patient face to face. The total time spent in the appointment was 20 minutes.    Truitt Merle, MD 05/04/2016   2:37 PM

## 2016-05-04 NOTE — Telephone Encounter (Signed)
GAVE PATIENT AVS REPORT AND APPOINTMENTS FOR December AND February

## 2016-05-24 ENCOUNTER — Encounter: Payer: Self-pay | Admitting: Vascular Surgery

## 2016-05-26 ENCOUNTER — Encounter: Payer: Self-pay | Admitting: Vascular Surgery

## 2016-05-30 ENCOUNTER — Ambulatory Visit (INDEPENDENT_AMBULATORY_CARE_PROVIDER_SITE_OTHER): Payer: Medicare Other | Admitting: Vascular Surgery

## 2016-05-30 ENCOUNTER — Encounter: Payer: Self-pay | Admitting: Vascular Surgery

## 2016-05-30 VITALS — BP 123/75 | HR 83 | Temp 99.0°F | Resp 18 | Ht 71.0 in | Wt 184.0 lb

## 2016-05-30 DIAGNOSIS — I83893 Varicose veins of bilateral lower extremities with other complications: Secondary | ICD-10-CM

## 2016-05-30 NOTE — Progress Notes (Signed)
Subjective:     Patient ID: [name] [name], male   DOB: [birth year], 82 y.o.   MRN: [id number]  HPI This 82 year old male returns having been evaluated by Dr. Donzetta Matters  on September 22 for left venous stasis ulcer. Patient did eventually heal the ulcer in his left malleolar area after 6 weeks the wound center. He has chronic edema in both legs with darkening of the skin left worse than right with aching and throbbing discomfort and bulging varicosities. He was found to have gross reflux in his left great saphenous system at his last visit. He returns now to further discuss treatment. He has no history of recent DVT or thrombophlebitis. He does take Xarelto for polycythemia vera.  Past Medical History:  Diagnosis Date  . Bone marrow disease   . BPH (benign prostatic hypertrophy)   . Depression   . DJD (degenerative joint disease)   . Dyslipidemia   . Gout   . HTN (hypertension)   . Mild aortic stenosis   . Polycythemia vera(238.4)     Social History  Substance Use Topics  . Smoking status: Never Smoker  . Smokeless tobacco: Never Used  . Alcohol use 4.2 oz/week    7 Glasses of wine per week     Comment: red wine    Family History  Problem Relation Age of Onset  . Hypertension Mother     Allergies  Allergen Reactions  . Amoxicillin Swelling    Bilateral  Hands  Swelling.  . Colchicine     Hands swell up, and hot     Current Outpatient Prescriptions:  .  acetaminophen (TYLENOL) 325 MG tablet, Take 650 mg by mouth every 8 (eight) hours as needed., Disp: , Rfl:  .  allopurinol (ZYLOPRIM) 100 MG tablet, Take 200 mg by mouth daily., Disp: , Rfl:  .  ALPRAZolam (XANAX) 0.5 MG tablet, Take 0.5 mg by mouth as needed., Disp: , Rfl:  .  escitalopram (LEXAPRO) 10 MG tablet, Take 5 mg by mouth daily. , Disp: , Rfl:  .  fish oil-omega-3 fatty acids 1000 MG capsule, Take 1 g by mouth daily., Disp: , Rfl:  .  hydroxyurea (HYDREA) 500 MG capsule, Take 500 mg by mouth as directed. May take  with food to minimize GI side effects.   Take 500 mg  Daily  For  5 days  -  T, W, Th, Sat, Sun., Disp: , Rfl:  .  lisinopril (PRINIVIL,ZESTRIL) 10 MG tablet, Take 10 mg by mouth daily., Disp: , Rfl:  .  Multiple Vitamin (MULTIVITAMIN) tablet, Take 1 tablet by mouth daily., Disp: , Rfl:  .  rivaroxaban (XARELTO) 20 MG TABS tablet, Take 20 mg by mouth daily with supper., Disp: , Rfl:  .  terazosin (HYTRIN) 5 MG capsule, Take 5 mg by mouth at bedtime., Disp: , Rfl:  .  vitamin E (VITAMIN E) 400 UNIT capsule, Take 400 Units by mouth daily., Disp: , Rfl:   Vitals:   05/30/16 1426  BP: 123/75  Pulse: 83  Resp: 18  Temp: 99 F (37.2 C)  SpO2: 95%  Weight: 184 lb (83.5 kg)  Height: 5\' 11"  (1.803 m)    Body mass index is 25.66 kg/m.         Review of Systems Denies chest pain, dyspnea on exertion, PND, orthopnea, hemoptysis. Has old history of DVT in the left leg.    Objective:   Physical Exam BP 123/75 (BP Location: Left Arm, Patient Position: Sitting, Cuff  Size: Normal)   Pulse 83   Temp 99 F (37.2 C)   Resp 18   Ht 5\' 11"  (1.803 m)   Wt 184 lb (83.5 kg)   SpO2 95%   BMI 25.66 kg/m   Dental elderly male in no apparent distress alert and oriented 3 Lungs no rhonchi or wheezing Bilateral lower extremities with severe hyperpigmentation lower third with thickening of the skin and 1+ edema. Evidence where ulcer healed in the left medial malleolar area. 2+ dorsalis pedis pulse palpable bilaterally. Bulging varicosities in both lower extremities above and below the knee over great saphenous system.  Today I performed a bedside SonoSite ultrasound exam of the right leg which also hasn't enlarged great saphenous vein with gross reflux throughout supplying these painful varicosities as well as the left leg which was studied formally at the last visit     Assessment:     #1 bilateral painful varicosities with history of recent healing of stasis ulcer left ankle. Bilateral  chronic edema and bilateral severe skin changes due to gross reflux bilateral great saphenous veins #2 polycythemia vera-onXeralto    Plan:     Patient will return in early January to complete his 3 month trial of conservative treatment with long leg elastic compression stockings 20-30 millimeter gradient, elevation, and ibuprofen. We will also obtain a formal venous reflux study of the right leg at that time We will then make formal recommendation Most likely patient will need #1 laser ablation left great saphenous vein followed by #2 laser ablation right great saphenous vein followed by three-month waiting. Then evaluate for possible stab phlebectomy Return in early January 2018

## 2016-05-30 NOTE — Addendum Note (Signed)
Addended by: Mena Goes on: 05/30/2016 03:27 PM   Modules accepted: Orders

## 2016-06-29 ENCOUNTER — Other Ambulatory Visit (HOSPITAL_BASED_OUTPATIENT_CLINIC_OR_DEPARTMENT_OTHER): Payer: Medicare Other

## 2016-06-29 ENCOUNTER — Ambulatory Visit (HOSPITAL_BASED_OUTPATIENT_CLINIC_OR_DEPARTMENT_OTHER): Payer: Medicare Other

## 2016-06-29 DIAGNOSIS — D45 Polycythemia vera: Secondary | ICD-10-CM

## 2016-06-29 LAB — CBC WITH DIFFERENTIAL/PLATELET
BASO%: 0.9 % (ref 0.0–2.0)
BASOS ABS: 0.2 10*3/uL — AB (ref 0.0–0.1)
EOS%: 0.8 % (ref 0.0–7.0)
Eosinophils Absolute: 0.2 10*3/uL (ref 0.0–0.5)
HCT: 46.8 % (ref 38.4–49.9)
HEMOGLOBIN: 14.4 g/dL (ref 13.0–17.1)
LYMPH#: 1.2 10*3/uL (ref 0.9–3.3)
LYMPH%: 4.4 % — ABNORMAL LOW (ref 14.0–49.0)
MCH: 28.3 pg (ref 27.2–33.4)
MCHC: 30.8 g/dL — ABNORMAL LOW (ref 32.0–36.0)
MCV: 91.9 fL (ref 79.3–98.0)
MONO#: 1.1 10*3/uL — ABNORMAL HIGH (ref 0.1–0.9)
MONO%: 4.1 % (ref 0.0–14.0)
NEUT#: 23.7 10*3/uL — ABNORMAL HIGH (ref 1.5–6.5)
NEUT%: 89.8 % — ABNORMAL HIGH (ref 39.0–75.0)
NRBC: 0 % (ref 0–0)
Platelets: 136 10*3/uL — ABNORMAL LOW (ref 140–400)
RBC: 5.09 10*6/uL (ref 4.20–5.82)
RDW: 15.8 % — AB (ref 11.0–14.6)
WBC: 26.4 10*3/uL — ABNORMAL HIGH (ref 4.0–10.3)

## 2016-06-29 NOTE — Patient Instructions (Signed)

## 2016-06-29 NOTE — Progress Notes (Signed)
Per Dr. Burr Medico patient is free to go after 30 minute observation. Total 408 gm collected. Patient ate peanut butter and crackers. VSS.

## 2016-06-29 NOTE — Progress Notes (Signed)
Phlebotomy to left AC with 16G needle removed 408 ml.  Hct met goals of 46.8 which is greater than 45.  Blood return stopped at 408 ml.  Dawn RN will confer with MD to see if need to finish remaining 500 goal.    Report given to [facility]

## 2016-07-06 ENCOUNTER — Encounter: Payer: Self-pay | Admitting: Vascular Surgery

## 2016-07-18 ENCOUNTER — Encounter: Payer: Self-pay | Admitting: Vascular Surgery

## 2016-07-18 ENCOUNTER — Ambulatory Visit (INDEPENDENT_AMBULATORY_CARE_PROVIDER_SITE_OTHER): Payer: Medicare Other | Admitting: Vascular Surgery

## 2016-07-18 ENCOUNTER — Ambulatory Visit (HOSPITAL_COMMUNITY)
Admission: RE | Admit: 2016-07-18 | Discharge: 2016-07-18 | Disposition: A | Discharge status: Home / Self Care | Payer: Medicare Other | Source: Ambulatory Visit | Attending: Vascular Surgery | Admitting: Vascular Surgery

## 2016-07-18 VITALS — BP 158/78 | HR 83 | Temp 97.4°F | Resp 16 | Ht 71.0 in | Wt 185.0 lb

## 2016-07-18 DIAGNOSIS — I83893 Varicose veins of bilateral lower extremities with other complications: Secondary | ICD-10-CM

## 2016-07-18 NOTE — Progress Notes (Signed)
Subjective:     Patient ID: [name] [name], male   DOB: 08-12-1923, 83 y.o.   MRN: [id number]  HPI This 83 year old male returns for continued follow-up regarding his severe venous insufficiency of both legs with a recent left venous stasis ulcer which required 6 weeks and the wound center to heal. He also has chronic swelling in the contralateral right leg. He has painful varicosities in both legs. He is tried long-leg elastic compression stockings 20-30 millimeter gradient as well as elevation and continues to have symptoms. He does take Xeralto on a chronic basis for remote history of DVT in the left leg. The symptoms are affecting his daily living and he is concerned about further stasis ulcers.  Past Medical History:  Diagnosis Date  . Bone marrow disease   . BPH (benign prostatic hypertrophy)   . Depression   . DJD (degenerative joint disease)   . Dyslipidemia   . Gout   . HTN (hypertension)   . Mild aortic stenosis   . Polycythemia vera(238.4)     Social History  Substance Use Topics  . Smoking status: Never Smoker  . Smokeless tobacco: Never Used  . Alcohol use 4.2 oz/week    7 Glasses of wine per week     Comment: red wine    Family History  Problem Relation Age of Onset  . Hypertension Mother     Allergies  Allergen Reactions  . Amoxicillin Swelling    Bilateral  Hands  Swelling.  . Colchicine     Hands swell up, and hot     Current Outpatient Prescriptions:  .  acetaminophen (TYLENOL) 325 MG tablet, Take 650 mg by mouth every 8 (eight) hours as needed., Disp: , Rfl:  .  allopurinol (ZYLOPRIM) 100 MG tablet, Take 200 mg by mouth daily., Disp: , Rfl:  .  ALPRAZolam (XANAX) 0.5 MG tablet, Take 0.5 mg by mouth as needed., Disp: , Rfl:  .  escitalopram (LEXAPRO) 10 MG tablet, Take 5 mg by mouth daily. , Disp: , Rfl:  .  fish oil-omega-3 fatty acids 1000 MG capsule, Take 1 g by mouth daily., Disp: , Rfl:  .  hydroxyurea (HYDREA) 500 MG capsule, Take 500 mg by  mouth as directed. May take with food to minimize GI side effects.   Take 500 mg  Daily  For  5 days  -  T, W, Th, Sat, Sun., Disp: , Rfl:  .  lisinopril (PRINIVIL,ZESTRIL) 10 MG tablet, Take 10 mg by mouth daily., Disp: , Rfl:  .  Multiple Vitamin (MULTIVITAMIN) tablet, Take 1 tablet by mouth daily., Disp: , Rfl:  .  rivaroxaban (XARELTO) 20 MG TABS tablet, Take 20 mg by mouth daily with supper., Disp: , Rfl:  .  terazosin (HYTRIN) 5 MG capsule, Take 5 mg by mouth at bedtime., Disp: , Rfl:  .  vitamin E (VITAMIN E) 400 UNIT capsule, Take 400 Units by mouth daily., Disp: , Rfl:   Vitals:   07/18/16 1407  BP: (!) 158/78  Pulse: 83  Resp: 16  Temp: 97.4 F (36.3 C)  SpO2: 98%  Weight: 185 lb (83.9 kg)  Height: 5\' 11"  (1.803 m)    Body mass index is 25.8 kg/m.         Review of Systems  Patient has history of polycythemia vera. Denies chest pain, dyspnea on exertion, PND, orthopnea, hemoptysis, claudication.    Objective:   Physical Exam BP (!) 158/78   Pulse 83   Temp  97.4 F (36.3 C)   Resp 16   Ht 5\' 11"  (1.803 m)   Wt 185 lb (83.9 kg)   SpO2 98%   BMI 25.80 kg/m   Gen. well-developed well-nourished male no apparent distress alert and oriented 3 Appears younger than his stated age of 88 Lungs no rhonchi or wheezing Left leg with hyperpigmentation lower third with evidence of healed ulcer near left medial malleolus and 2+ posterior tibial pulse palpable Right leg with hyperpigmentation lower third right leg with no active ulcer. Bulging varicosities in both posterior calf and medial calf over great saphenous system.  Patient has documented gross reflux and enlarged great saphenous veins bilaterally. There is no totally obstructed DVT in either leg. There is some evidence of old chronic nonobstructive DVT in the left leg and the common femoral vein.  I confirmed these findings with the SonoSite ultrasound     Assessment:     Severe gross reflux bilateral great  saphenous veins with pain and swelling and history of left leg stasis ulcer requiring 6 weeks in wound center to heal-symptoms are affecting patient's daily living and he is very concerned about further skin breakdown and ulceration and skin changes Polycythemia vera    Plan:     Have recommended proceeding with left great saphenous vein laser ablation followed by #2 right great saphenous vein laser ablation. He will need to have his anticoagulation-Xeralto temporarily discontinued for each procedure We will proceed with precertification to perform this in the near future and hopefully stabilize this patient scan and improve the pain and swelling Discussed that at length with patient and he would like to proceed

## 2016-07-19 ENCOUNTER — Other Ambulatory Visit: Payer: Self-pay | Admitting: *Deleted

## 2016-07-19 DIAGNOSIS — I83893 Varicose veins of bilateral lower extremities with other complications: Secondary | ICD-10-CM

## 2016-08-19 ENCOUNTER — Telehealth: Payer: Self-pay | Admitting: Hematology

## 2016-08-19 NOTE — Telephone Encounter (Signed)
S/w pt, advised md appt moved from 2/15 to 3/1. Pt states he wants to keep all appts on same day. Moved all appts opn 2/15 to 3/1 @ 1.45pm.

## 2016-08-25 ENCOUNTER — Encounter: Payer: Self-pay | Admitting: Vascular Surgery

## 2016-08-28 ENCOUNTER — Encounter: Payer: Self-pay | Admitting: Vascular Surgery

## 2016-08-30 ENCOUNTER — Telehealth: Payer: Self-pay | Admitting: *Deleted

## 2016-08-30 NOTE — Telephone Encounter (Signed)
Received FAX clearance 08-29-2016 from Dr. Dagmar Hait approving [name] [name] to stop Xarelto 2 days prior to and one day following office surgery (endovenous laser ablation scheduled for  09-04-2016 by Dr. Tinnie Gens).  Called [name] [name] and verified with him to stop Xarelto 09-02-16---09-05-2016 and to resume Xarelto on 09-06-2016.  [name] [name] verbalized understanding of these instructions.

## 2016-08-31 ENCOUNTER — Ambulatory Visit: Payer: Medicare Other | Admitting: Hematology

## 2016-08-31 ENCOUNTER — Other Ambulatory Visit: Payer: Medicare Other

## 2016-09-04 ENCOUNTER — Ambulatory Visit (INDEPENDENT_AMBULATORY_CARE_PROVIDER_SITE_OTHER): Payer: Medicare Other | Admitting: Vascular Surgery

## 2016-09-04 ENCOUNTER — Encounter: Payer: Self-pay | Admitting: Vascular Surgery

## 2016-09-04 VITALS — BP 137/77 | HR 81 | Temp 98.3°F | Resp 18 | Ht 71.0 in | Wt 182.0 lb

## 2016-09-04 DIAGNOSIS — I83892 Varicose veins of left lower extremities with other complications: Secondary | ICD-10-CM | POA: Diagnosis not present

## 2016-09-04 HISTORY — PX: ENDOVENOUS ABLATION SAPHENOUS VEIN W/ LASER: SUR449

## 2016-09-04 NOTE — Progress Notes (Signed)
Laser Ablation Procedure    Date: 09/04/2016   [name] [name] DOB:09/24/1923  Consent signed: Yes    Surgeon:  Dr. Nelda Severe. Kellie Simmering  Procedure: Laser Ablation: left Greater Saphenous Vein  BP 137/77 (BP Location: Left Arm, Patient Position: Sitting, Cuff Size: Normal)   Pulse 81   Temp 98.3 F (36.8 C) (Oral)   Resp 18   Ht 5\' 11"  (1.803 m)   Wt 182 lb (82.6 kg)   SpO2 96%   BMI 25.38 kg/m   Tumescent Anesthesia: 350 cc 0.9% NaCl with 50 cc Lidocaine HCL with 1% Epi and 15 cc 8.4% NaHCO3  Local Anesthesia: 1 cc Lidocaine HCL and NaHCO3 (ratio 2:1)  Pulsed Mode: 15 watts, 541ms delay, 1.0 duration  Total Energy: 2529 Joules             Total Pulses: 169               Total Time: 2:49        Patient tolerated procedure well  Notes: Patient's last dose of Xarelto was 09-01-2016   Description of Procedure:  After marking the course of the secondary varicosities, the patient was placed on the operating table in the supine position, and the left leg was prepped and draped in sterile fashion.   Local anesthetic was administered and under ultrasound guidance the saphenous vein was accessed with a micro needle and guide wire; then the mirco puncture sheath was placed.  A guide wire was inserted saphenofemoral junction , followed by a 5 french sheath.  The position of the sheath and then the laser fiber below the junction was confirmed using the ultrasound.  Tumescent anesthesia was administered along the course of the saphenous vein using ultrasound guidance. The patient was placed in Trendelenburg position and protective laser glasses were placed on patient and staff, and the laser was fired at 15 watts continuous mode advancing 1-97mm/second for a total of 2529 joules.       Steri strips was and ABD pads and thigh high compression stockings were applied.  Ace wrap bandages were applied  at the top of the saphenofemoral junction. Blood loss was less than 15 cc.  The patient ambulated  out of the operating room having tolerated the procedure well.

## 2016-09-04 NOTE — Progress Notes (Signed)
Subjective:     Patient ID: [name] [name], male   DOB: [birth year], 83 y.o.   MRN: [id number]  HPI this 83 year old male laser ablation of the left great saphenous vein from the proximal calf to near the saphenofemoral junction performed under local tumescent anesthesia. A total of 2529 J of energy was utilized. He tolerated the procedure well. Review of Systems     Objective:   Physical Exam BP 137/77 (BP Location: Left Arm, Patient Position: Sitting, Cuff Size: Normal)   Pulse 81   Temp 98.3 F (36.8 C) (Oral)   Resp 18   Ht 5\' 11"  (1.803 m)   Wt 182 lb (82.6 kg)   SpO2 96%   BMI 25.38 kg/m       Assessment:     Well-tolerated laser ablation left great saphenous vein performed under local tumescent anesthesia    Plan:     Return in 1 week for venous duplex exam to confirm closure left great saphenous vein Patient will resume Xeralto on Thursday of this week and discontinue ibuprofen same day

## 2016-09-05 ENCOUNTER — Encounter: Payer: Self-pay | Admitting: Vascular Surgery

## 2016-09-11 ENCOUNTER — Encounter: Payer: Self-pay | Admitting: Vascular Surgery

## 2016-09-11 ENCOUNTER — Ambulatory Visit (HOSPITAL_COMMUNITY)
Admission: RE | Admit: 2016-09-11 | Discharge: 2016-09-11 | Disposition: A | Discharge status: Home / Self Care | Payer: Medicare Other | Source: Ambulatory Visit | Attending: Vascular Surgery | Admitting: Vascular Surgery

## 2016-09-11 ENCOUNTER — Ambulatory Visit (INDEPENDENT_AMBULATORY_CARE_PROVIDER_SITE_OTHER): Payer: Medicare Other | Admitting: Vascular Surgery

## 2016-09-11 VITALS — BP 140/76 | HR 71 | Temp 98.0°F | Resp 18 | Ht 70.0 in | Wt 184.0 lb

## 2016-09-11 DIAGNOSIS — I83893 Varicose veins of bilateral lower extremities with other complications: Secondary | ICD-10-CM | POA: Diagnosis not present

## 2016-09-11 NOTE — Progress Notes (Signed)
Subjective:     Patient ID: [name] [name], male   DOB: 07-13-1924, 83 y.o.   MRN: [id number]  HPI 83 year old male returns 1 week post-laser ablation left great saphenous vein for painful varicosities with swelling.'s able to take the ibuprofen for only 3 days because he resumed his chronic anticoagulation-Xeralto he did develop significant discomfort after discontinuing the ibuprofen. He did wear his elastic compression stocking as instructed. His symptoms have improved but not resolved.   Past Medical History:  Diagnosis Date  . Bone marrow disease   . BPH (benign prostatic hypertrophy)   . Depression   . DJD (degenerative joint disease)   . Dyslipidemia   . Gout   . HTN (hypertension)   . Mild aortic stenosis   . Polycythemia vera(238.4)     Social History  Substance Use Topics  . Smoking status: Never Smoker  . Smokeless tobacco: Never Used  . Alcohol use 4.2 oz/week    7 Glasses of wine per week     Comment: red wine    Family History  Problem Relation Age of Onset  . Hypertension Mother     Allergies  Allergen Reactions  . Amoxicillin Swelling    Bilateral  Hands  Swelling.  . Colchicine     Hands swell up, and hot     Current Outpatient Prescriptions:  .  acetaminophen (TYLENOL) 325 MG tablet, Take 650 mg by mouth every 8 (eight) hours as needed., Disp: , Rfl:  .  allopurinol (ZYLOPRIM) 100 MG tablet, Take 200 mg by mouth daily., Disp: , Rfl:  .  escitalopram (LEXAPRO) 10 MG tablet, Take 5 mg by mouth daily. , Disp: , Rfl:  .  fish oil-omega-3 fatty acids 1000 MG capsule, Take 1 g by mouth daily., Disp: , Rfl:  .  hydroxyurea (HYDREA) 500 MG capsule, Take 500 mg by mouth as directed. May take with food to minimize GI side effects.   Take 500 mg  Daily  For  5 days  -  T, W, Th, Sat, Sun., Disp: , Rfl:  .  lisinopril (PRINIVIL,ZESTRIL) 10 MG tablet, Take 10 mg by mouth daily., Disp: , Rfl:  .  Multiple Vitamin (MULTIVITAMIN) tablet, Take 1 tablet by mouth  daily., Disp: , Rfl:  .  rivaroxaban (XARELTO) 20 MG TABS tablet, Take 20 mg by mouth daily with supper., Disp: , Rfl:  .  terazosin (HYTRIN) 5 MG capsule, Take 5 mg by mouth at bedtime., Disp: , Rfl:  .  vitamin E (VITAMIN E) 400 UNIT capsule, Take 400 Units by mouth daily., Disp: , Rfl:  .  ALPRAZolam (XANAX) 0.5 MG tablet, Take 0.5 mg by mouth as needed., Disp: , Rfl:  .  predniSONE (DELTASONE) 20 MG tablet, , Disp: , Rfl:   Vitals:   09/11/16 1354  BP: 140/76  Pulse: 71  Resp: 18  Temp: 98 F (36.7 C)  SpO2: 96%  Weight: 184 lb (83.5 kg)  Height: 5\' 10"  (1.778 m)    Body mass index is 26.4 kg/m.       Marland Kitchen  Review of Systems As chest pain, dyspnea on exertion, PND, orthopnea, hemoptysis. Does have degenerative joint disease    Objective:   Physical Exam BP 140/76 (BP Location: Left Arm, Patient Position: Sitting, Cuff Size: Normal)   Pulse 71   Temp 98 F (36.7 C)   Resp 18   Ht 5\' 10"  (1.778 m)   Wt 184 lb (83.5 kg)   SpO2 96%  BMI 26.40 kg/m   Gen. well-developed well-nourished male in no apparent distress alert and oriented 3 Lungs no rhonchi or wheezing Cardiovascular regular rhythm no murmurs Left leg with mild to moderate ecchymosis in mid thigh with tenderness to deep palpation. 1+ distal edema noted. 3+ dorsalis pedis pulse palpable.  Today I ordered a venous duplex exam the left leg which I reviewed and interpreted. There is no DVT. There is total closure of the left great saphenous vein from the proximal calf to near the saphenofemoral junction     Assessment:     Successful laser ablation left great saphenous vein for painful swelling and gross reflux    Plan:     Plan same procedure on contralateral right leg in a few weeks

## 2016-09-11 NOTE — Progress Notes (Signed)
[facility] OFFICE PROGRESS NOTE  Tivis Ringer, MD [place] [address]  DIAGNOSIS: Polycythemia vera ([place])   CURRENT TREATMENT:   1. Phlebotomy as needed if HCT>45%.  2. Hydrea decreased to 500mg  daily 4-5 days/week since 02/2015  3. Xalreto 20 mg started about mid-February 2015 and off after 6 month, restarted again in 02/2015 due to left LE DVT  INTERVAL HISTORY: [name] [name] 83 y.o. male with a history of Polycythemia vera dating back to 1997, JAK2 mutation positive is here for follow up. He is accompanied by his sister-in-law to the clinic today. He had vascular surgery on his left leg on 09/04/16 for poor circulation. The poor circulation was attributed to his compression stocking. It was laser ablation of the left great saphenous vein from the proximal calf to near the saphenofemoral junction performed under local tumescent anesthesia. His surgeon is Dr. Kellie Simmering of vascular surgery. He has swelling of the leg and discharge. He had ulcers previously in this area for which he had to present to the wound center. He is tolerating Hydrea well.  MEDICAL HISTORY: Past Medical History:  Diagnosis Date  . Bone marrow disease   . BPH (benign prostatic hypertrophy)   . Depression   . DJD (degenerative joint disease)   . Dyslipidemia   . Gout   . HTN (hypertension)   . Mild aortic stenosis   . Polycythemia vera(238.4)     INTERIM HISTORY: has Polycythemia vera ([place]); Aneurysm of unspecified site; DVT (deep venous thrombosis) ([place]); Thrombocytopenia ([place]); and Varicose veins of bilateral lower extremities with other complications on his problem list.    ALLERGIES:  is allergic to amoxicillin and colchicine.  MEDICATIONS: has a current medication list which includes the following prescription(s): acetaminophen, allopurinol, alprazolam, escitalopram, fish oil-omega-3 fatty acids, hydroxyurea, lisinopril, multivitamin, rivaroxaban, terazosin, tramadol,  and vitamin e.  SURGICAL HISTORY:  Past Surgical History:  Procedure Laterality Date  . ENDOVENOUS ABLATION SAPHENOUS VEIN W/ LASER Left 09/04/2016   endovenous laser ablation left greater saphenous vein by Tinnie Gens MD  . HEMORROIDECTOMY      REVIEW OF SYSTEMS:   Constitutional: Denies fevers, chills or abnormal weight loss Eyes: Denies blurriness of vision Ears, nose, mouth, throat, and face: Denies mucositis or sore throat Respiratory: Denies cough, dyspnea or wheezes Cardiovascular: Denies palpitation, chest discomfort or lower extremity swelling Gastrointestinal:  Denies nausea, heartburn or change in bowel habits Skin: Denies abnormal skin rashes (+) Left leg blister and bruising. Lymphatics: Denies new lymphadenopathy or easy bruising Neurological:Denies numbness, tingling or new weaknesses Behavioral/Psych: Mood is stable, no new changes  All other systems were reviewed with the patient and are negative.  PHYSICAL EXAMINATION: ECOG PERFORMANCE STATUS: 1  Blood pressure (!) 151/63, pulse 78, temperature 97.8 F (36.6 C), temperature source Oral, resp. rate 17, height 5\' 10"  (1.778 m), weight 183 lb 4.8 oz (83.1 kg), SpO2 95 %.  General: Easily ambulatory.  HEENT: Sclerae anicteric. Conjunctivae were pink. Pupils round and reactive bilaterally. Oral mucosa is moist without ulceration or thrush.  No occipital, submandibular, cervical, supraclavicular or axillar adenopathy.  Lungs: clear to auscultation without wheezes. No rales or rhonchi.  Heart: regular rate and rhythm. No gallop or rubs. 2/6 systolic murmur.  Abdomen: soft, non tender. No guarding or rebound tenderness. Bowel sounds are present. No palpable hepatosplenomegaly.  MSK: no focal spinal tenderness.  Extremities: No clubbing or cyanosis.No calf tenderness to palpitation, no peripheral edema. The patient had grossly intact strength in  upper and lower extremities.  Skin: Diffuse erythema and skin pigmentation  in the left lower extremity above the ankle and he has a 2 x 2 cm blister above the ankle not draining. Neuro: non-focal, alert and oriented to time, person and place, appropriate affect     Labs:  CBC Latest Ref Rng & Units 09/14/2016 06/29/2016 05/04/2016  WBC 4.0 - 10.3 10e3/uL 23.3(H) 26.4(H) 22.8(H)  Hemoglobin 13.0 - 17.1 g/dL 14.5 14.4 13.5  Hematocrit 38.4 - 49.9 % 46.7 46.8 44.0  Platelets 140 - 400 10e3/uL 127(L) 136(L) 128(L)   CMP Latest Ref Rng & Units 09/14/2016 05/04/2016 04/06/2016  Glucose 70 - 140 mg/dl 86 145(H) 133  BUN 7.0 - 26.0 mg/dL 26.1(H) 27.7(H) 33.0(H)  Creatinine 0.7 - 1.3 mg/dL 1.2 1.2 1.2  Sodium 136 - 145 mEq/L 139 139 138  Potassium 3.5 - 5.1 mEq/L 4.9 4.5 5.2(H)  Chloride 98 - 107 mEq/L - - -  CO2 22 - 29 mEq/L 28 23 22   Calcium 8.4 - 10.4 mg/dL 9.2 8.7 8.8  Total Protein 6.4 - 8.3 g/dL 6.3(L) 6.1(L) 6.2(L)  Total Bilirubin 0.20 - 1.20 mg/dL 0.70 0.48 0.59  Alkaline Phos 40 - 150 U/L 91 88 83  AST 5 - 34 U/L 21 25 26   ALT 0 - 55 U/L 15 14 19     RADIOGRAPHIC STUDIES: No results found.  ASSESSMENT: [name] [name] 83 y.o. male with a history of Polycythemia vera ([place])   PLAN:  1. Polycythemia vera diagnosed in 1997, JAK2 mutation positive.  -We reviewed the nature history of PV, most people do very well, some people would develop myelofibrosis or leukemia late on. The major complication is thrombosis. -He has been on Hydrea 500 mg daily for 4-5 years, due to the worsening thrombocytopenia, I decreased to 500 mg 4 days a week (M, W, F, Sat and Sun) -His thrombocytopenia has improved, but his WBC has increased to 20K's since we decreased his Hydrea. H/H also slightly increased. So I increased his Hydrea to 500 mg daily except Monday and Friday. -Lab reviewed, hematocrit 46.7% TODAY, he needs phlebotomy TODAY. -he will continue Hydrea 500 mg daily except Monday and Friday  -We discussed at that [facility] will slow down wound healing. If his left lower  extremity blister becomes a wound, and does not he well, I may consider holding Hydrea for short period time. -He is on Xarelto now.  2 RLE DVT, LLE DVT in 02/2015, likely secondary to #1 -I recommend him to continue Xarelto indefinitely, due to recurrent DVT. -He knows to be careful about fall and injury -He had vascular surgery on his left leg on 09/04/16 for poor circulation. Laser ablation of the left great saphenous vein from the proximal calf to near the saphenofemoral junction performed under local tumescent anesthesia by Dr. Kellie Simmering.  3. Left ankle wound  -healed now   4. LE skin erythema and pigmentation, likely segmented to venous stasis, new blister  -he will follow up with his vascular surgeon Dr. Kellie Simmering -I will contact Dr. Evelena Leyden office to see if they have special instruction about his blister   PLAN:  -Phlebotomy today -continue Hydrea to 500 mg 5 days a week -CBC every 2 months and plembotomy if HCT>45%. -Return to clinic in 4 months. -I will fax my note to Dr. Evelena Leyden office to see if they have special instruction about his blister   All questions were answered. The patient knows to call the clinic with any problems, questions  or concerns. We can certainly see the patient much sooner if necessary.  I spent 15 minutes counseling the patient face to face. The total time spent in the appointment was 20 minutes.    Truitt Merle, MD 09/14/2016   2:23 PM   This document serves as a record of services personally performed by Truitt Merle, MD. It was created on her behalf by Darcus Austin, a trained medical scribe. The creation of this record is based on the scribe's personal observations and the provider's statements to them. This document has been checked and approved by the attending provider.

## 2016-09-13 ENCOUNTER — Telehealth: Payer: Self-pay | Admitting: *Deleted

## 2016-09-13 NOTE — Telephone Encounter (Signed)
Patient developed a blister on the outside of his left leg about 3-4 inches above his ankle. He thinks maybe the stocking rubbed the area. This is the leg where he recently had the laser ablation. The blister popped and is draining yellow clear fluid. We discussed placing a telfa pad over the area, trying to keep the bandage dry , elevating when he can and not re-injuring the area. Patient expressed understanding. Will follow prn.

## 2016-09-14 ENCOUNTER — Telehealth: Payer: Self-pay | Admitting: Hematology

## 2016-09-14 ENCOUNTER — Telehealth: Payer: Self-pay | Admitting: *Deleted

## 2016-09-14 ENCOUNTER — Telehealth: Payer: Self-pay

## 2016-09-14 ENCOUNTER — Ambulatory Visit (HOSPITAL_BASED_OUTPATIENT_CLINIC_OR_DEPARTMENT_OTHER): Payer: Medicare Other | Admitting: Hematology

## 2016-09-14 ENCOUNTER — Encounter: Payer: Self-pay | Admitting: Vascular Surgery

## 2016-09-14 ENCOUNTER — Other Ambulatory Visit (HOSPITAL_BASED_OUTPATIENT_CLINIC_OR_DEPARTMENT_OTHER): Payer: Medicare Other

## 2016-09-14 ENCOUNTER — Ambulatory Visit (HOSPITAL_BASED_OUTPATIENT_CLINIC_OR_DEPARTMENT_OTHER): Payer: Medicare Other

## 2016-09-14 VITALS — BP 113/75 | HR 78 | Resp 16

## 2016-09-14 VITALS — BP 151/63 | HR 78 | Temp 97.8°F | Resp 17 | Ht 70.0 in | Wt 183.3 lb

## 2016-09-14 DIAGNOSIS — L539 Erythematous condition, unspecified: Secondary | ICD-10-CM

## 2016-09-14 DIAGNOSIS — D45 Polycythemia vera: Secondary | ICD-10-CM

## 2016-09-14 DIAGNOSIS — I82403 Acute embolism and thrombosis of unspecified deep veins of lower extremity, bilateral: Secondary | ICD-10-CM | POA: Diagnosis not present

## 2016-09-14 DIAGNOSIS — L819 Disorder of pigmentation, unspecified: Secondary | ICD-10-CM | POA: Diagnosis not present

## 2016-09-14 LAB — CBC WITH DIFFERENTIAL/PLATELET
BASO%: 0.4 % (ref 0.0–2.0)
BASOS ABS: 0.1 10*3/uL (ref 0.0–0.1)
EOS ABS: 0.2 10*3/uL (ref 0.0–0.5)
EOS%: 0.7 % (ref 0.0–7.0)
HCT: 46.7 % (ref 38.4–49.9)
HEMOGLOBIN: 14.5 g/dL (ref 13.0–17.1)
LYMPH%: 5.5 % — ABNORMAL LOW (ref 14.0–49.0)
MCH: 28.6 pg (ref 27.2–33.4)
MCHC: 31.1 g/dL — ABNORMAL LOW (ref 32.0–36.0)
MCV: 92 fL (ref 79.3–98.0)
MONO#: 1 10*3/uL — ABNORMAL HIGH (ref 0.1–0.9)
MONO%: 4.3 % (ref 0.0–14.0)
NEUT%: 89.1 % — ABNORMAL HIGH (ref 39.0–75.0)
NEUTROS ABS: 20.7 10*3/uL — AB (ref 1.5–6.5)
Platelets: 127 10*3/uL — ABNORMAL LOW (ref 140–400)
RBC: 5.08 10*6/uL (ref 4.20–5.82)
RDW: 17.1 % — AB (ref 11.0–14.6)
WBC: 23.3 10*3/uL — AB (ref 4.0–10.3)
lymph#: 1.3 10*3/uL (ref 0.9–3.3)

## 2016-09-14 LAB — COMPREHENSIVE METABOLIC PANEL
ALBUMIN: 3.9 g/dL (ref 3.5–5.0)
ALK PHOS: 91 U/L (ref 40–150)
ALT: 15 U/L (ref 0–55)
AST: 21 U/L (ref 5–34)
Anion Gap: 7 mEq/L (ref 3–11)
BUN: 26.1 mg/dL — AB (ref 7.0–26.0)
CALCIUM: 9.2 mg/dL (ref 8.4–10.4)
CO2: 28 mEq/L (ref 22–29)
Chloride: 104 mEq/L (ref 98–109)
Creatinine: 1.2 mg/dL (ref 0.7–1.3)
EGFR: 54 mL/min/{1.73_m2} — ABNORMAL LOW (ref >90)
GLUCOSE: 86 mg/dL (ref 70–140)
Potassium: 4.9 mEq/L (ref 3.5–5.1)
SODIUM: 139 meq/L (ref 136–145)
TOTAL PROTEIN: 6.3 g/dL — AB (ref 6.4–8.3)
Total Bilirubin: 0.7 mg/dL (ref 0.20–1.20)

## 2016-09-14 NOTE — Telephone Encounter (Signed)
Gave patient AVS and calender per 09/14/2016 los.

## 2016-09-14 NOTE — Telephone Encounter (Signed)
Spoke with Arbie Cookey, RN triage nurse @ [facility].  Asked Arbie Cookey to relay message to Dr. Kellie Simmering re:  Reviewing notes from Dr. Burr Medico along with picture of Left leg included - large blister on leg from post surgery on 09/04/16.  Informed Arbie Cookey that pt is taking Hydrea which might cause delayed in wound healing as per md. Arbie Cookey stated Dr. Kellie Simmering only works on Monday and Tuesday.  Arbie Cookey will relay message to Dr. Kellie Simmering on Monday 09/18/16. Dr. Evelena Leyden   Office    Phone     [phone].

## 2016-09-14 NOTE — Telephone Encounter (Signed)
Sched appt 09/15/16 w/[facility] at 3:00. Spoke to pt to inform him of appt.

## 2016-09-14 NOTE — Patient Instructions (Signed)
Therapeutic Phlebotomy Therapeutic phlebotomy is the controlled removal of blood from a person's body for the purpose of treating a medical condition. The procedure is similar to donating blood. Usually, about a pint (470 mL, or 0.47L) of blood is removed. The average adult has 9-12 pints (4.3-5.7 L) of blood. Therapeutic phlebotomy may be used to treat the following medical conditions:  Hemochromatosis. This is a condition in which the blood contains too much iron.  Polycythemia vera. This is a condition in which the blood contains too many red blood cells.  Porphyria cutanea tarda. This is a disease in which an important part of hemoglobin is not made properly. It results in the buildup of abnormal amounts of porphyrins in the body.  Sickle cell disease. This is a condition in which the red blood cells form an abnormal crescent shape rather than a round shape. Tell a health care provider about:  Any allergies you have.  All medicines you are taking, including vitamins, herbs, eye drops, creams, and over-the-counter medicines.  Any problems you or family members have had with anesthetic medicines.  Any blood disorders you have.  Any surgeries you have had.  Any medical conditions you have. What are the risks? Generally, this is a safe procedure. However, problems may occur, including:  Nausea or light-headedness.  Low blood pressure.  Soreness, bleeding, swelling, or bruising at the needle insertion site.  Infection. What happens before the procedure?  Follow instructions from your health care provider about eating or drinking restrictions.  Ask your health care provider about changing or stopping your regular medicines. This is especially important if you are taking diabetes medicines or blood thinners.  Wear clothing with sleeves that can be raised above the elbow.  Plan to have someone take you home after the procedure.  You may have a blood sample taken. What happens  during the procedure?  A needle will be inserted into one of your veins.  Tubing and a collection bag will be attached to that needle.  Blood will flow through the needle and tubing into the collection bag.  You may be asked to open and close your hand slowly and continually during the entire collection.  After the specified amount of blood has been removed from your body, the collection bag and tubing will be clamped.  The needle will be removed from your vein.  Pressure will be held on the site of the needle insertion to stop the bleeding.  A bandage (dressing) will be placed over the needle insertion site. The procedure may vary among health care providers and hospitals. What happens after the procedure?  Your recovery will be assessed and monitored.  You can return to your normal activities as directed by your health care provider. This information is not intended to replace advice given to you by your health care provider. Make sure you discuss any questions you have with your health care provider. Document Released: 12/05/2010 Document Revised: 03/04/2016 Document Reviewed: 06/29/2014 Elsevier Interactive Patient Education  2017 Elsevier Inc.  

## 2016-09-14 NOTE — Telephone Encounter (Signed)
Phone call to L. Wert, RN, vein nurse.  Reported that Dr. Ernestina Penna nurse called to make Dr. Kellie Simmering aware of blister left LE.  Reviewed Dr. Ernestina Penna note with L. Melvyn Novas, Therapist, sports.  Per vein nurse, recommended scheduling appt. with another provider, tomorrow to check left LE, in Dr. Evelena Leyden absence.  Will have Scheduler contact pt. For an appt.

## 2016-09-14 NOTE — Telephone Encounter (Signed)
rec'd phone call from nurse @ Dr. Ernestina Penna office.  Reported that Dr. Burr Medico wanted to make Dr. Kellie Simmering aware of pt. developing a blister on his left lower leg.  Also wanted to make him aware of pt. taking Hydrea and thus having slower healing, related to this drug.  Advised Oncology nurse that Dr. Kellie Simmering is not available and will inform him of this on Monday, 3/5, when he returns to office.  Informed nurse that pt. had a Left GSV Laser ablation on 2/19.  Notified Thea Silversmith, RN, vein nurse of the above.

## 2016-09-15 ENCOUNTER — Ambulatory Visit (INDEPENDENT_AMBULATORY_CARE_PROVIDER_SITE_OTHER): Payer: Self-pay | Admitting: Vascular Surgery

## 2016-09-15 ENCOUNTER — Encounter: Payer: Self-pay | Admitting: Vascular Surgery

## 2016-09-15 VITALS — BP 134/76 | HR 77 | Temp 98.2°F | Resp 16 | Ht 70.0 in | Wt 184.0 lb

## 2016-09-15 DIAGNOSIS — I872 Venous insufficiency (chronic) (peripheral): Secondary | ICD-10-CM

## 2016-09-15 NOTE — Progress Notes (Signed)
Subjective:     Patient ID: [name] [name], male   DOB: 14-Sep-1923, 83 y.o.   MRN: [id number]  HPI 83 year old male recently underwent left GSC ablation now has a wound to his left ankle on the lateral aspect. It has had some seeping drainage. He has attempted to not wear his compression stockings. Is causing irritation. He does not have fevers or chills has not had any purulent drainage or cellulitis.   Review of Systems Blister of left lateral leg    Objective:   Physical Exam aaox3 3cm left leg blister with surrounding edema, does not have associated cellulitis Saphenous ablation site healing well    Assessment/plan     83 year old male here for evaluation of left leg ulceration status post vein ablation. Place a nonstick dressing and Ace bandage to the left leg today and he will try to get help from his wife's aid in getting his compression stockings were Ace wrap on. We will contact him next week to reschedule his next vein ablation that he wants to put off until this issue has resolved. Should he have further issues we can certainly see him otherwise we'll see him at the time of next scheduled ablation.  Ceniyah Thorp C. Donzetta Matters, MD [facility] Office: [phone] Pager: [phone]

## 2016-09-19 ENCOUNTER — Telehealth: Payer: Self-pay | Admitting: *Deleted

## 2016-09-19 NOTE — Telephone Encounter (Signed)
Returning Mr. [name] [name]'s earlier phone message regarding rescheduling his office surgery.  [name] [name] states he had a weeping wound 3 inches above his left ankle (left lateral) and wants to cancel his office surgery scheduled for 10-02-2016 and reschedule.  [name] [name] states the left lateral wound is"drying out" and "seems to be getting better".  He requests that office surgery be rescheduled to April.  Endovenous laser ablation R GSV) by [name] Gens MD rescheduled for  11-06-2016.  Reviewed instructions for holding Xarelto with [name] [name] and will call him back closer to procedure date to review those instructions. Will notify [name] [name] ([name] [name] PCP) of rescheduled office surgery.

## 2016-09-20 DIAGNOSIS — Z6826 Body mass index (BMI) 26.0-26.9, adult: Secondary | ICD-10-CM | POA: Diagnosis not present

## 2016-09-20 DIAGNOSIS — I87319 Chronic venous hypertension (idiopathic) with ulcer of unspecified lower extremity: Secondary | ICD-10-CM | POA: Diagnosis not present

## 2016-09-21 ENCOUNTER — Telehealth: Payer: Self-pay | Admitting: *Deleted

## 2016-09-21 NOTE — Telephone Encounter (Signed)
This telephone encounter opened in error.

## 2016-09-21 NOTE — Telephone Encounter (Signed)
Left telephone voice message with Winnifred Friar (Dr. Danna Hefty nurse) to inform them that [name] [name]'s endovenous laser ablation (right greater saphenous vein) has been cancelled for 10-02-2016 and rescheduled for 11-06-2016 due to patient's request.

## 2016-10-02 ENCOUNTER — Other Ambulatory Visit: Payer: Medicare Other | Admitting: Vascular Surgery

## 2016-10-04 ENCOUNTER — Telehealth: Payer: Self-pay | Admitting: *Deleted

## 2016-10-04 NOTE — Telephone Encounter (Signed)
Mr. [name] [name] calling to cancel office surgery scheduled for 11-06-2016 (endovenous laser ablation of right greater saphenous vein).  Mr. [name] [name] states his left leg (which was treated on 09-04-2016 endovenous laser ablation of left greater saphenous vein) has "not improved and is still symptomatic". Encouraged him to keep legs elevated when sitting and wear compression hose if he can tolerate. Mr. [name] [name] will call [facility] if he further questions or concerns.

## 2016-10-09 ENCOUNTER — Ambulatory Visit: Payer: Medicare Other | Admitting: Vascular Surgery

## 2016-10-09 ENCOUNTER — Encounter (HOSPITAL_COMMUNITY): Payer: Medicare Other

## 2016-10-19 ENCOUNTER — Emergency Department (HOSPITAL_COMMUNITY): Payer: Medicare Other

## 2016-10-19 ENCOUNTER — Encounter (HOSPITAL_COMMUNITY): Payer: Self-pay | Admitting: Emergency Medicine

## 2016-10-19 ENCOUNTER — Emergency Department (HOSPITAL_COMMUNITY)
Admission: EM | Admit: 2016-10-19 | Discharge: 2016-10-19 | Disposition: A | Discharge status: Home / Self Care | Payer: Medicare Other | Attending: Emergency Medicine | Admitting: Emergency Medicine

## 2016-10-19 DIAGNOSIS — I10 Essential (primary) hypertension: Secondary | ICD-10-CM | POA: Insufficient documentation

## 2016-10-19 DIAGNOSIS — Z7902 Long term (current) use of antithrombotics/antiplatelets: Secondary | ICD-10-CM | POA: Diagnosis not present

## 2016-10-19 DIAGNOSIS — Z79899 Other long term (current) drug therapy: Secondary | ICD-10-CM | POA: Insufficient documentation

## 2016-10-19 DIAGNOSIS — R072 Precordial pain: Secondary | ICD-10-CM | POA: Insufficient documentation

## 2016-10-19 DIAGNOSIS — R0789 Other chest pain: Secondary | ICD-10-CM

## 2016-10-19 DIAGNOSIS — R079 Chest pain, unspecified: Secondary | ICD-10-CM | POA: Diagnosis not present

## 2016-10-19 LAB — BASIC METABOLIC PANEL
Anion gap: 7 (ref 5–15)
BUN: 30 mg/dL — ABNORMAL HIGH (ref 6–20)
CALCIUM: 8.9 mg/dL (ref 8.9–10.3)
CHLORIDE: 105 mmol/L (ref 101–111)
CO2: 26 mmol/L (ref 22–32)
Creatinine, Ser: 1.16 mg/dL (ref 0.61–1.24)
GFR, EST NON AFRICAN AMERICAN: 54 mL/min — AB (ref >60)
Glucose, Bld: 163 mg/dL — ABNORMAL HIGH (ref 65–99)
POTASSIUM: 4.3 mmol/L (ref 3.5–5.1)
Sodium: 138 mmol/L (ref 135–145)

## 2016-10-19 LAB — CBC
HEMATOCRIT: 45.5 % (ref 39.0–52.0)
HEMOGLOBIN: 14.1 g/dL (ref 13.0–17.0)
MCH: 28.4 pg (ref 26.0–34.0)
MCHC: 31 g/dL (ref 30.0–36.0)
MCV: 91.5 fL (ref 78.0–100.0)
Platelets: 111 10*3/uL — ABNORMAL LOW (ref 150–400)
RBC: 4.97 MIL/uL (ref 4.22–5.81)
RDW: 15.6 % — AB (ref 11.5–15.5)
WBC: 26.1 10*3/uL — AB (ref 4.0–10.5)

## 2016-10-19 LAB — I-STAT TROPONIN, ED
TROPONIN I, POC: 0 ng/mL (ref 0.00–0.08)
TROPONIN I, POC: 0.02 ng/mL (ref 0.00–0.08)

## 2016-10-19 MED ORDER — IOPAMIDOL (ISOVUE-370) INJECTION 76%
100.0000 mL | Freq: Once | INTRAVENOUS | Status: AC | PRN
  Administered 2016-10-19: 100 mL via INTRAVENOUS

## 2016-10-19 MED ORDER — IOPAMIDOL (ISOVUE-370) INJECTION 76%
INTRAVENOUS | Status: AC
  Filled 2016-10-19: qty 100

## 2016-10-19 MED ORDER — NITROGLYCERIN 0.4 MG SL SUBL
0.4000 mg | SUBLINGUAL_TABLET | SUBLINGUAL | Status: DC | PRN
  Filled 2016-10-19: qty 1

## 2016-10-19 MED ORDER — ASPIRIN 81 MG PO CHEW
324.0000 mg | CHEWABLE_TABLET | Freq: Once | ORAL | Status: AC
  Administered 2016-10-19: 324 mg via ORAL
  Filled 2016-10-19: qty 4

## 2016-10-19 MED ORDER — SODIUM CHLORIDE 0.9 % IV BOLUS (SEPSIS)
1000.0000 mL | Freq: Once | INTRAVENOUS | Status: AC
  Administered 2016-10-19: 1000 mL via INTRAVENOUS

## 2016-10-19 NOTE — ED Provider Notes (Signed)
[facility] DEPT Provider Note   CSN: [id number] Arrival date & time: 10/19/16  1037     History   Chief Complaint Chief Complaint  Patient presents with  . Chest Pain    HPI [name] [name] is a 83 y.o. male.  HPI  83 year-old male with a history of polycythemia vera, hypertension, and DVT currently on Xarelto presents with chest pain. He states it is under his sternum, on the inside. Started last night around 9 PM. Felt like a pressure and aching. No shortness of breath. Feels worse with inspiration. No fever, cough, nausea, vomiting, diaphoresis. Nothing particularly makes it better or worse except for some inspiration. No exertional pain. Has chronic leg swelling and recently had a vein procedure 6 weeks ago. However no recent diagnosis of DVT. No recent illness. Pain was around a 5 or 6 when it first started but now is about a 1 or 2 out of 10. No abdominal or back pain.  Past Medical History:  Diagnosis Date  . Bone marrow disease   . BPH (benign prostatic hypertrophy)   . Depression   . DJD (degenerative joint disease)   . Dyslipidemia   . Gout   . HTN (hypertension)   . Mild aortic stenosis   . Polycythemia vera(238.4)     Patient Active Problem List   Diagnosis Date Noted  . Varicose veins of bilateral lower extremities with other complications 92/33/0076  . Thrombocytopenia ([place]) 11/18/2015  . DVT (deep venous thrombosis) ([place]) 09/26/2013  . Aneurysm of unspecified site 02/26/2012  . Polycythemia vera ([place]) 05/30/2011    Past Surgical History:  Procedure Laterality Date  . ENDOVENOUS ABLATION SAPHENOUS VEIN W/ LASER Left 09/04/2016   endovenous laser ablation left greater saphenous vein by Tinnie Gens MD  . [facility] Medications    Prior to Admission medications   Medication Sig Start Date End Date Taking? Authorizing Provider  acetaminophen (TYLENOL) 650 MG CR tablet Take 650 mg by mouth at bedtime.   Yes Historical Provider,  MD  allopurinol (ZYLOPRIM) 100 MG tablet Take 100 mg by mouth 2 (two) times daily.    Yes Historical Provider, MD  ALPRAZolam Duanne Moron) 0.5 MG tablet Take 0.5 mg by mouth daily as needed for anxiety.    Yes Historical Provider, MD  escitalopram (LEXAPRO) 10 MG tablet Take 5 mg by mouth at bedtime.    Yes Historical Provider, MD  glucosamine-chondroitin 500-400 MG tablet Take 1 tablet by mouth daily.   Yes Historical Provider, MD  hydroxyurea (HYDREA) 500 MG capsule Take 500 mg by mouth See admin instructions. Pt takes once daily in the morning on Sunday, Tuesday, Wednesday, Thursday, and Saturday.   Yes Historical Provider, MD  lisinopril (PRINIVIL,ZESTRIL) 10 MG tablet Take 10 mg by mouth daily.   Yes Historical Provider, MD  Multiple Vitamin (MULTIVITAMIN WITH MINERALS) TABS tablet Take 1 tablet by mouth daily.   Yes Historical Provider, MD  omega-3 acid ethyl esters (LOVAZA) 1 g capsule Take 1 g by mouth daily.   Yes Historical Provider, MD  rivaroxaban (XARELTO) 20 MG TABS tablet Take 20 mg by mouth daily with supper.   Yes Historical Provider, MD  terazosin (HYTRIN) 5 MG capsule Take 5 mg by mouth at bedtime.   Yes Historical Provider, MD  traMADol (ULTRAM) 50 MG tablet Take 50 mg by mouth every 8 (eight) hours as needed for moderate pain.    Yes Historical Provider, MD  vitamin E (VITAMIN E) 400 UNIT capsule Take 400 Units by mouth daily.   Yes Historical Provider, MD    Family History Family History  Problem Relation Age of Onset  . Hypertension Mother     Social History Social History  Substance Use Topics  . Smoking status: Never Smoker  . Smokeless tobacco: Never Used  . Alcohol use 4.2 oz/week    7 Glasses of wine per week     Comment: red wine     Allergies   Amoxicillin and Colchicine   Review of Systems Review of Systems  Constitutional: Negative for chills and fever.  Respiratory: Negative for cough and shortness of breath.   Cardiovascular: Positive for chest  pain.  Gastrointestinal: Negative for abdominal pain, nausea and vomiting.  Musculoskeletal: Negative for back pain.  All other systems reviewed and are negative.    Physical Exam Updated Vital Signs BP 140/71   Pulse 66   Temp 97.5 F (36.4 C) (Oral)   Resp 17   Ht 5\' 11"  (1.803 m)   Wt 182 lb (82.6 kg)   SpO2 93%   BMI 25.38 kg/m   Physical Exam  Constitutional: He is oriented to person, place, and time. He appears well-developed and well-nourished. No distress.  HENT:  Head: Normocephalic and atraumatic.  Right Ear: External ear normal.  Left Ear: External ear normal.  Nose: Nose normal.  Eyes: Right eye exhibits no discharge. Left eye exhibits no discharge.  Neck: Neck supple.  Cardiovascular: Normal rate, regular rhythm and normal heart sounds.   Pulmonary/Chest: Effort normal and breath sounds normal. He exhibits tenderness (mild).    Abdominal: Soft. There is no tenderness.  Neurological: He is alert and oriented to person, place, and time.  Skin: Skin is warm and dry. He is not diaphoretic.  Nursing note and vitals reviewed.    ED Treatments / Results  Labs (all labs ordered are listed, but only abnormal results are displayed) Labs Reviewed  BASIC METABOLIC PANEL - Abnormal; Notable for the following:       Result Value   Glucose, Bld 163 (*)    BUN 30 (*)    GFR calc non Af Amer 54 (*)    All other components within normal limits  CBC - Abnormal; Notable for the following:    WBC 26.1 (*)    RDW 15.6 (*)    Platelets 111 (*)    All other components within normal limits  I-STAT TROPOININ, ED  I-STAT TROPOININ, ED    EKG  EKG Interpretation  Date/Time:  Thursday October 19 2016 10:46:21 EDT Ventricular Rate:  97 PR Interval:    QRS Duration: 110 QT Interval:  351 QTC Calculation: 446 R Axis:   -49 Text Interpretation:  Sinus rhythm Multiple ventricular premature complexes Prolonged PR interval Probable left atrial enlargement Left anterior  fascicular block Borderline T abnormalities, inferior leads Baseline wander in lead(s) II No old tracing to compare Confirmed by Domitila Stetler MD, Synia Douglass [phone]) on 10/19/2016 10:52:27 AM Also confirmed by Regenia Skeeter MD, Flynn Lininger [phone]), editor Lorenda Cahill CT, [place] [phone])  on 10/19/2016 11:26:57 AM       EKG Interpretation  Date/Time:  Thursday October 19 2016 15:04:32 EDT Ventricular Rate:  66 PR Interval:    QRS Duration: 112 QT Interval:  404 QTC Calculation: 424 R Axis:   -27 Text Interpretation:  Sinus rhythm Prolonged PR interval Incomplete left bundle branch block PVCs resolved no acute ST/T changes Confirmed by Arvetta Araque MD, Samyia Motter [phone])  on 10/19/2016 3:28:29 PM        Radiology Dg Chest 2 View  Result Date: 10/19/2016 CLINICAL DATA:  a left-sided chest pain since last evening. EXAM: CHEST  2 VIEW COMPARISON:  None. FINDINGS: The cardiac silhouette, mediastinal and hilar contours are normal. The lungs are clear of an acute process. There is mild eventration of the right hemidiaphragm. A large calcified granuloma is noted in the left upper lobe. The bony thorax is intact. IMPRESSION: No acute cardiopulmonary findings. Electronically Signed   By: Marijo Sanes M.D.   On: 10/19/2016 11:03   Ct Angio Chest Pe W/cm &/or Wo Cm  Result Date: 10/19/2016 CLINICAL DATA:  Central chest pain starting last night, history of DVT EXAM: CT ANGIOGRAPHY CHEST WITH CONTRAST TECHNIQUE: Multidetector CT imaging of the chest was performed using the standard protocol during bolus administration of intravenous contrast. Multiplanar CT image reconstructions and MIPs were obtained to evaluate the vascular anatomy. CONTRAST:  100 cc Isovue COMPARISON:  None. FINDINGS: Cardiovascular: Atherosclerotic calcifications of thoracic aorta. No aortic aneurysm. The study is of excellent technical quality. No pulmonary embolus is noted. Atherosclerotic calcifications of coronary arteries. Mediastinum/Nodes: Precarinal lymph node Measures 1  cm short-axis. AP window lymph node Measures 1.1 cm short-axis. Right hilar lymph node measures 1 cm short-axis. Left hilar lymph node measures 1 cm short-axis. These are borderline enlarged by size criteria. Central airways are patent. Lungs/Pleura: Images of the lung parenchyma shows no acute infiltrate or pulmonary edema. Dependent atelectasis noted bilateral lower lobe posteriorly. There is a calcified granuloma in left upper lobe anteriorly measures 1.3 cm. No pleural plaques or pleural thickening. No fibrotic changes. No pneumothorax. Upper Abdomen: There is partially visualized probable significant splenomegaly. Further evaluation is recommended. Elevation of the right hemidiaphragm is noted. No focal hepatic mass. No calcified gallstones are noted within gallbladder. No adrenal gland mass. Musculoskeletal: No destructive bony lesions are noted. Sagittal images of the spine shows mild degenerative changes upper and mid thoracic spine. Sagittal view of the sternum is unremarkable. Review of the MIP images confirms the above findings. IMPRESSION: 1. No pulmonary embolus is noted. 2. No acute infiltrate or pulmonary edema. Mild dependent atelectasis bilateral lower lobe posteriorly. Calcified granuloma in left upper lobe anteriorly measures 1.3 cm. 3. There is partially visualized probable significant splenomegaly. Further evaluation is recommended. 4. Borderline mediastinal and hilar adenopathy might be reactive. Clinical correlation is necessary. Electronically Signed   By: Lahoma Crocker M.D.   On: 10/19/2016 13:47    Procedures Procedures (including critical care time)  Medications Ordered in ED Medications  nitroGLYCERIN (NITROSTAT) SL tablet 0.4 mg (not administered)  iopamidol (ISOVUE-370) 76 % injection (not administered)  aspirin chewable tablet 324 mg (324 mg Oral Given 10/19/16 1210)  sodium chloride 0.9 % bolus 1,000 mL (0 mLs Intravenous Stopped 10/19/16 1430)  iopamidol (ISOVUE-370) 76 %  injection 100 mL (100 mLs Intravenous Contrast Given 10/19/16 1316)     Initial Impression / Assessment and Plan / ED Course  I have reviewed the triage vital signs and the nursing notes.  Pertinent labs & imaging results that were available during my care of the patient were reviewed by me and considered in my medical decision making (see chart for details).  Clinical Course as of Oct 20 1810  Thu Oct 19, 2016  1139 Overall appears well, pani now mild. Will give ASA, nitro, and check labs. Will consider CTA given a pleuritic component  [SG]  1225 Pain free after ASA  only, did not get nitro. Will get CTA given DVT hx and pleuritic pain.  [SG]    Clinical Course User Index [SG] Sherwood Gambler, MD    CTA negative for PE. First ECG with baseline wander and PVCs, difficult to interpret. Offered admission but he declines, citing need to be home with wife as he takes care of her. Discussed differential, including unstable angina/ or undiagnosed CAD causing this pain (which has remained resolved). He declines admission, agrees to f/u closely with PCP. We discussed importance of return if symptoms worsen or recur. Agrees to 2nd troponin. 2nd troponin is 0.02, first 0.00. While this has slightly increased, it is also negative by lab standards. Discussed with patient, he does not want to stay for further workup or another troponin. I think this does not represent MI. D/C home with strict return precautions  Final Clinical Impressions(s) / ED Diagnoses   Final diagnoses:  Mid sternal chest pain    New Prescriptions Discharge Medication List as of 10/19/2016  3:43 PM       Sherwood Gambler, MD 10/19/16 5093

## 2016-10-19 NOTE — ED Notes (Signed)
ED Provider at bedside. 

## 2016-10-19 NOTE — ED Notes (Signed)
PT DENIES CHEST PAIN OR ANY PAIN AT THIS TIME AND DECLINES NTG AT THIS TIME. AWARE THAT IF CHEST PAIN COMES BACK TO MAKE STAFF AWARE.

## 2016-10-19 NOTE — ED Triage Notes (Signed)
Patient c/o central chest pain that started last night. Patient denies anything that makes pain better or worse.

## 2016-11-01 DIAGNOSIS — E784 Other hyperlipidemia: Secondary | ICD-10-CM | POA: Diagnosis not present

## 2016-11-01 DIAGNOSIS — Z125 Encounter for screening for malignant neoplasm of prostate: Secondary | ICD-10-CM | POA: Diagnosis not present

## 2016-11-01 DIAGNOSIS — I10 Essential (primary) hypertension: Secondary | ICD-10-CM | POA: Diagnosis not present

## 2016-11-01 DIAGNOSIS — R7302 Impaired glucose tolerance (oral): Secondary | ICD-10-CM | POA: Diagnosis not present

## 2016-11-01 DIAGNOSIS — M109 Gout, unspecified: Secondary | ICD-10-CM | POA: Diagnosis not present

## 2016-11-06 ENCOUNTER — Other Ambulatory Visit: Payer: Medicare Other | Admitting: Vascular Surgery

## 2016-11-08 DIAGNOSIS — R079 Chest pain, unspecified: Secondary | ICD-10-CM | POA: Diagnosis not present

## 2016-11-08 DIAGNOSIS — Z6826 Body mass index (BMI) 26.0-26.9, adult: Secondary | ICD-10-CM | POA: Diagnosis not present

## 2016-11-08 DIAGNOSIS — E784 Other hyperlipidemia: Secondary | ICD-10-CM | POA: Diagnosis not present

## 2016-11-08 DIAGNOSIS — Z0000 Encounter for general adult medical examination without abnormal findings: Secondary | ICD-10-CM | POA: Diagnosis not present

## 2016-11-08 DIAGNOSIS — I10 Essential (primary) hypertension: Secondary | ICD-10-CM | POA: Diagnosis not present

## 2016-11-08 DIAGNOSIS — I87319 Chronic venous hypertension (idiopathic) with ulcer of unspecified lower extremity: Secondary | ICD-10-CM | POA: Diagnosis not present

## 2016-11-08 DIAGNOSIS — Z1389 Encounter for screening for other disorder: Secondary | ICD-10-CM | POA: Diagnosis not present

## 2016-11-08 DIAGNOSIS — M109 Gout, unspecified: Secondary | ICD-10-CM | POA: Diagnosis not present

## 2016-11-08 DIAGNOSIS — F329 Major depressive disorder, single episode, unspecified: Secondary | ICD-10-CM | POA: Diagnosis not present

## 2016-11-08 DIAGNOSIS — I350 Nonrheumatic aortic (valve) stenosis: Secondary | ICD-10-CM | POA: Diagnosis not present

## 2016-11-08 DIAGNOSIS — D45 Polycythemia vera: Secondary | ICD-10-CM | POA: Diagnosis not present

## 2016-11-08 DIAGNOSIS — R7302 Impaired glucose tolerance (oral): Secondary | ICD-10-CM | POA: Diagnosis not present

## 2016-11-13 ENCOUNTER — Ambulatory Visit: Payer: Medicare Other | Admitting: Vascular Surgery

## 2016-11-13 ENCOUNTER — Encounter (HOSPITAL_COMMUNITY): Payer: Medicare Other

## 2016-11-16 ENCOUNTER — Ambulatory Visit (HOSPITAL_BASED_OUTPATIENT_CLINIC_OR_DEPARTMENT_OTHER): Payer: Medicare Other

## 2016-11-16 ENCOUNTER — Other Ambulatory Visit (HOSPITAL_BASED_OUTPATIENT_CLINIC_OR_DEPARTMENT_OTHER): Payer: Medicare Other

## 2016-11-16 DIAGNOSIS — D45 Polycythemia vera: Secondary | ICD-10-CM

## 2016-11-16 LAB — CBC WITH DIFFERENTIAL/PLATELET
BASO%: 1.4 % (ref 0.0–2.0)
Basophils Absolute: 0.4 10*3/uL — ABNORMAL HIGH (ref 0.0–0.1)
EOS ABS: 0.3 10*3/uL (ref 0.0–0.5)
EOS%: 1 % (ref 0.0–7.0)
HCT: 46.8 % (ref 38.4–49.9)
HEMOGLOBIN: 14.5 g/dL (ref 13.0–17.1)
LYMPH%: 6.9 % — ABNORMAL LOW (ref 14.0–49.0)
MCH: 29.3 pg (ref 27.2–33.4)
MCHC: 31 g/dL — ABNORMAL LOW (ref 32.0–36.0)
MCV: 94.5 fL (ref 79.3–98.0)
MONO#: 1.5 10*3/uL — ABNORMAL HIGH (ref 0.1–0.9)
MONO%: 5.8 % (ref 0.0–14.0)
NEUT#: 21.9 10*3/uL — ABNORMAL HIGH (ref 1.5–6.5)
NEUT%: 84.9 % — AB (ref 39.0–75.0)
PLATELETS: 100 10*3/uL — AB (ref 140–400)
RBC: 4.95 10*6/uL (ref 4.20–5.82)
RDW: 15.8 % — ABNORMAL HIGH (ref 11.0–14.6)
WBC: 25.8 10*3/uL — ABNORMAL HIGH (ref 4.0–10.3)
lymph#: 1.8 10*3/uL (ref 0.9–3.3)
nRBC: 0 % (ref 0–0)

## 2016-11-16 NOTE — Patient Instructions (Signed)

## 2016-12-19 DIAGNOSIS — Z6825 Body mass index (BMI) 25.0-25.9, adult: Secondary | ICD-10-CM | POA: Diagnosis not present

## 2016-12-19 DIAGNOSIS — R1032 Left lower quadrant pain: Secondary | ICD-10-CM | POA: Diagnosis not present

## 2016-12-19 DIAGNOSIS — M25552 Pain in left hip: Secondary | ICD-10-CM | POA: Diagnosis not present

## 2017-01-05 ENCOUNTER — Other Ambulatory Visit: Payer: Self-pay | Admitting: Nurse Practitioner

## 2017-01-08 ENCOUNTER — Ambulatory Visit (INDEPENDENT_AMBULATORY_CARE_PROVIDER_SITE_OTHER): Payer: Medicare Other | Admitting: Orthopaedic Surgery

## 2017-01-08 ENCOUNTER — Ambulatory Visit (INDEPENDENT_AMBULATORY_CARE_PROVIDER_SITE_OTHER): Payer: Medicare Other

## 2017-01-08 DIAGNOSIS — M25552 Pain in left hip: Secondary | ICD-10-CM

## 2017-01-08 DIAGNOSIS — M1612 Unilateral primary osteoarthritis, left hip: Secondary | ICD-10-CM

## 2017-01-08 NOTE — Progress Notes (Signed)
Office Visit Note   Patient: [name] [name]           Date of Birth: Sep 07, 1923           MRN: [id number] Visit Date: 01/08/2017              Requested by: [name], MD [address] [place],  [address] PCP: [name], MD   Assessment & Plan: Visit Diagnoses:  1. Pain in left hip   2. Unilateral primary osteoarthritis, left hip     Plan: He does have quite extensive arthritis in his left hip. I feel that he would benefit most at this point from an intra-articular steroid injection which could help decrease his pain and improve his mobility as well as quality of life. He is not interested in surgery right now due to the fact is caring for his wife is in a wheelchair and he says that he had a hip replacement he would need to put her in skilled nursing following its over his own surgery. I have recommended he try cane in his opposite hand. I do feel that intra-articular injection by Dr. Ernestina Patches would help significantly and could help differentiate between this being more of an intestinal issue versus his hip but I do feel this is more of his hip than anything. All questions were encouraged and answered. We'll work on getting this injection set up for his left hip and I'll see him back myself in 4 weeks.  Follow-Up Instructions: Return in about 4 weeks (around 02/05/2017).   Orders:  Orders Placed This Encounter  Procedures  . XR HIP UNILAT W OR W/O PELVIS 1V LEFT  . Ambulatory referral to Physical Medicine Rehab   No orders of the defined types were placed in this encounter.     Procedures: No procedures performed   Clinical Data: No additional findings.   Subjective: No chief complaint on file. Patient is very pleasant 83 year old gentleman sent from Dr. Dagmar Hait to evaluate left hip pain. It hurts mainly in his groin. The patient was concerned may be that this could be a hernia or diverticular disease but his pain is mainly with activities and hurts with  rotating his hip. His hip is stiff and is hard to get his shoes and socks on. His pain is daily and is detrimentally affected activities daily living, his quality of life, and his mobility.  HPI  Review of Systems He currently denies any headache, chest pain, short ref, fever, chills, nausea, vomiting.  Objective: Vital Signs: There were no vitals taken for this visit.  Physical Exam He is alert or 3 in no acute distress. He mobilizes very slowly and walks slow and gets out of the exam chair very slow. Ortho Exam Examination of his left hip show severe limitations and pain with internal and external rotation. His right hip moves smoothly and is pain-free. Specialty Comments:  No specialty comments available.  Imaging: Xr Hip Unilat W Or W/o Pelvis 1v Left  Result Date: 01/08/2017 An AP pelvis and a lateral of his left hip show severe end-stage arthritis with joint space narrowing, periarticular osteophytes, cystic changes and sclerotic changes.    PMFS History: Patient Active Problem List   Diagnosis Date Noted  . Unilateral primary osteoarthritis, left hip 01/08/2017  . Pain in left hip 01/08/2017  . Varicose veins of bilateral lower extremities with other complications 37/16/9678  . Thrombocytopenia ([place]) 11/18/2015  . DVT (deep venous thrombosis) ([place]) 09/26/2013  . Aneurysm  of unspecified site ([place]) 02/26/2012  . Polycythemia vera ([place]) 05/30/2011   Past Medical History:  Diagnosis Date  . Bone marrow disease   . BPH (benign prostatic hypertrophy)   . Depression   . DJD (degenerative joint disease)   . Dyslipidemia   . Gout   . HTN (hypertension)   . Mild aortic stenosis   . Polycythemia vera(238.4)     Family History  Problem Relation Age of Onset  . Hypertension Mother     Past Surgical History:  Procedure Laterality Date  . ENDOVENOUS ABLATION SAPHENOUS VEIN W/ LASER Left 09/04/2016   endovenous laser ablation left greater saphenous vein by Tinnie Gens  MD  . HEMORROIDECTOMY     Social History   Occupational History  . Not on file.   Social History Main Topics  . Smoking status: Never Smoker  . Smokeless tobacco: Never Used  . Alcohol use 4.2 oz/week    7 Glasses of wine per week     Comment: red wine  . Drug use: No  . Sexual activity: Not on file

## 2017-01-14 ENCOUNTER — Telehealth: Payer: Self-pay | Admitting: Hematology

## 2017-01-14 NOTE — Telephone Encounter (Signed)
S/w pt, advised 7/12 appt moved to 7/26 @ 11.15am due to md call.

## 2017-01-25 ENCOUNTER — Ambulatory Visit: Payer: Medicare Other | Admitting: Hematology

## 2017-01-25 ENCOUNTER — Other Ambulatory Visit: Payer: Medicare Other

## 2017-01-29 ENCOUNTER — Ambulatory Visit (INDEPENDENT_AMBULATORY_CARE_PROVIDER_SITE_OTHER): Payer: Medicare Other

## 2017-01-29 ENCOUNTER — Ambulatory Visit (INDEPENDENT_AMBULATORY_CARE_PROVIDER_SITE_OTHER): Payer: Medicare Other | Admitting: Physical Medicine and Rehabilitation

## 2017-01-29 ENCOUNTER — Encounter (INDEPENDENT_AMBULATORY_CARE_PROVIDER_SITE_OTHER): Payer: Self-pay | Admitting: Physical Medicine and Rehabilitation

## 2017-01-29 DIAGNOSIS — M25552 Pain in left hip: Secondary | ICD-10-CM | POA: Diagnosis not present

## 2017-01-29 MED ORDER — BUPIVACAINE HCL 0.5 % IJ SOLN
3.0000 mL | INTRAMUSCULAR | Status: AC | PRN
  Administered 2017-01-29: 3 mL via INTRA_ARTICULAR

## 2017-01-29 MED ORDER — TRIAMCINOLONE ACETONIDE 40 MG/ML IJ SUSP
80.0000 mg | INTRAMUSCULAR | Status: AC | PRN
  Administered 2017-01-29: 80 mg via INTRA_ARTICULAR

## 2017-01-29 NOTE — Progress Notes (Deleted)
Left hip pain for 6 months. Gotten worse over time. Groin pain and buttock pain.Pain down leg to knee at times.

## 2017-01-29 NOTE — Progress Notes (Signed)
[name] [name] - 83 y.o. male MRN [id number]  Date of birth: 15-Dec-1923  Office Visit Note: Visit Date: 01/29/2017 PCP: [name], MD Referred by: [name], MD  Subjective: Chief Complaint  Patient presents with  . Left Hip - Pain   HPI: Mr. [name] [name] is a very pleasant 83 year old gentleman with left hip and groin pain worse with standing and ambulating. He has seen Dr. Ninfa Linden recently for evaluation and they suggested intra-articular anesthetic hip arthrogram. Patient does have significant osteoarthritis of the hip.    ROS Otherwise per HPI.  Assessment & Plan: Visit Diagnoses:  1. Pain in left hip     Plan: Findings:  Diagnostic and hopefully therapeutic anesthetic hip arthrogram. Patient did have relief of his symptoms during the anesthetic phase of the injection.    Meds & Orders: No orders of the defined types were placed in this encounter.   Orders Placed This Encounter  Procedures  . Large Joint Injection/Arthrocentesis  . XR C-ARM NO REPORT    Follow-up: Return for Dr. Ninfa Linden as scheduled.   Procedures: Large Joint Inj Date/Time: 01/29/2017 11:01 AM Performed by: Magnus Sinning Authorized by: Magnus Sinning   Consent Given by:  Patient Site marked: the procedure site was marked   Timeout: prior to procedure the correct patient, procedure, and site was verified   Indications:  Pain and diagnostic evaluation Location:  Hip Site:  R hip joint Prep: patient was prepped and draped in usual sterile fashion   Needle Size:  22 G Approach:  Anterior Ultrasound Guidance: No   Fluoroscopic Guidance: No   Arthrogram: Yes   Medications:  3 mL bupivacaine 0.5 %; 80 mg triamcinolone acetonide 40 MG/ML Aspiration Attempted: Yes   Aspirate amount (mL):  4 Aspirate:  Serous and yellow Patient tolerance:  Patient tolerated the procedure well with no immediate complications  Arthrogram demonstrated excellent flow of contrast throughout the joint  surface without extravasation or obvious defect.  The patient had relief of symptoms during the anesthetic phase of the injection.      No notes on file   Clinical History: No specialty comments available.  He reports that he has never smoked. He has never used smokeless tobacco. No results for input(s): HGBA1C, LABURIC in the last 8760 hours.  Objective:  VS:  HT:    WT:   BMI:     BP:   HR: bpm  TEMP: ( )  RESP:  Physical Exam  Musculoskeletal:  Patient is slow to rise from a seated position. He does have pain with internal rotation of the left hip. He has good distal strength.    Ortho Exam Imaging: Xr C-arm No Report  Result Date: 01/29/2017 Please see Notes or Procedures tab for imaging impression.   Past Medical/Family/Surgical/Social History: Medications & Allergies reviewed per EMR Patient Active Problem List   Diagnosis Date Noted  . Unilateral primary osteoarthritis, left hip 01/08/2017  . Pain in left hip 01/08/2017  . Varicose veins of bilateral lower extremities with other complications 07/19/7251  . Thrombocytopenia ([place]) 11/18/2015  . DVT (deep venous thrombosis) ([place]) 09/26/2013  . Aneurysm of unspecified site ([place]) 02/26/2012  . Polycythemia vera ([place]) 05/30/2011   Past Medical History:  Diagnosis Date  . Bone marrow disease   . BPH (benign prostatic hypertrophy)   . Depression   . DJD (degenerative joint disease)   . Dyslipidemia   . Gout   . HTN (hypertension)   . Mild aortic stenosis   .  Polycythemia vera(238.4)    Family History  Problem Relation Age of Onset  . Hypertension Mother    Past Surgical History:  Procedure Laterality Date  . ENDOVENOUS ABLATION SAPHENOUS VEIN W/ LASER Left 09/04/2016   endovenous laser ablation left greater saphenous vein by Tinnie Gens MD  . HEMORROIDECTOMY     Social History   Occupational History  . Not on file.   Social History Main Topics  . Smoking status: Never Smoker  . Smokeless tobacco:  Never Used  . Alcohol use 4.2 oz/week    7 Glasses of wine per week     Comment: red wine  . Drug use: No  . Sexual activity: Not on file

## 2017-01-29 NOTE — Patient Instructions (Signed)

## 2017-02-02 NOTE — Progress Notes (Signed)
[facility] OFFICE PROGRESS NOTE  [name], MD [place] [address]  DIAGNOSIS: Polycythemia vera ([place])   CURRENT TREATMENT:   1. Phlebotomy as needed if HCT>45%.  2. Hydrea decreased to 500mg  daily 4-5 days/week since 02/2015; increased to take hydrea 6 days/week starting 02/08/17 3. Xalreto 20 mg started about mid-February 2015 and off after 6 month, restarted again in 02/2015 due to left LE DVT  INTERVAL HISTORY:  [name] [name] 83 y.o. male with a history of Polycythemia vera dating back to 1997, JAK2 mutation positive is here for follow up. He presents to the clinic today reporting to have a pain in his groin. He spoke to PCP in April who though it was due to hip deterioration. He was scheduled with a orthopedic surgeon who suggest a shot. He received shot last week but it did not help at all. Certain movements can increase the pain, the pain is locally The blister on his left leg is gone. He reports his legs are in the best shape they have been.   He has normal BM and urination. No blood or pain.  His last colonoscopy was over 5 years ago. But was told he had some diverticulitis.  He is able to tolerate hydrea and phlebotomy.     MEDICAL HISTORY: Past Medical History:  Diagnosis Date  . Bone marrow disease   . BPH (benign prostatic hypertrophy)   . Depression   . DJD (degenerative joint disease)   . Dyslipidemia   . Gout   . HTN (hypertension)   . Mild aortic stenosis   . Polycythemia vera(238.4)     INTERIM HISTORY: has Polycythemia vera ([place]); Aneurysm of unspecified site [facility]); DVT (deep venous thrombosis) ([place]); Thrombocytopenia ([place]); Varicose veins of bilateral lower extremities with other complications; Unilateral primary osteoarthritis, left hip; and Pain in left hip on his problem list.    ALLERGIES:  is allergic to amoxicillin and colchicine.  MEDICATIONS: has a current medication list which includes the following  prescription(s): acetaminophen, allopurinol, alprazolam, escitalopram, fish oil-omega-3 fatty acids, hydroxyurea, lisinopril, multivitamin, rivaroxaban, terazosin, tramadol, and vitamin e.  SURGICAL HISTORY:  Past Surgical History:  Procedure Laterality Date  . ENDOVENOUS ABLATION SAPHENOUS VEIN W/ LASER Left 09/04/2016   endovenous laser ablation left greater saphenous vein by Tinnie Gens MD  . HEMORROIDECTOMY      REVIEW OF SYSTEMS:   Constitutional: Denies fevers, chills or abnormal weight loss Eyes: Denies blurriness of vision Ears, nose, mouth, throat, and face: Denies mucositis or sore throat Respiratory: Denies cough, dyspnea or wheezes Cardiovascular: Denies palpitation, chest discomfort or lower extremity swelling Gastrointestinal:  Denies nausea, heartburn or change in bowel habits (+) Left Groin pain in LLQ Skin: Denies abnormal skin rashes  Lymphatics: Denies new lymphadenopathy or easy bruising Neurological:Denies numbness, tingling or new weaknesses Behavioral/Psych: Mood is stable, no new changes  All other systems were reviewed with the patient and are negative.  PHYSICAL EXAMINATION: ECOG PERFORMANCE STATUS: 1  Blood pressure 129/64, pulse 86, temperature 98 F (36.7 C), temperature source Oral, resp. rate 19, height 5\' 11"  (1.803 m), weight 179 lb 8 oz (81.4 kg), SpO2 98 %.  General: Easily ambulatory.  HEENT: Sclerae anicteric. Conjunctivae were pink. Pupils round and reactive bilaterally. Oral mucosa is moist without ulceration or thrush.  No occipital, submandibular, cervical, supraclavicular or axillar adenopathy.  Lungs: clear to auscultation without wheezes. No rales or rhonchi.  Heart: regular rate and rhythm. No gallop or rubs. 2/6  systolic murmur.  Abdomen: soft No guarding or rebound tenderness. (+) tenderness in Left lower quadrant of abdomen no palpable mass or adenopathy Bowel sounds are present. No palpable hepatosplenomegaly. Extremities: No  clubbing or cyanosis.No calf tenderness to palpitation, no peripheral edema. The patient had grossly intact strength in upper and lower extremities.  Skin: Diffuse erythema and skin pigmentation in the left lower extremity above the ankle and he has a 2 x 2 cm blister above the ankle not draining. Neuro: non-focal, alert and oriented to time, person and place, appropriate affect   Labs:  CBC Latest Ref Rng & Units 02/08/2017 11/16/2016 10/19/2016  WBC 4.0 - 10.3 10e3/uL 25.7(H) 25.8(H) 26.1(H)  Hemoglobin 13.0 - 17.1 g/dL 14.8 14.5 14.1  Hematocrit 38.4 - 49.9 % 47.5 46.8 45.5  Platelets 140 - 400 10e3/uL 109(L) 100(L) 111(L)   CMP Latest Ref Rng & Units 02/08/2017 10/19/2016 09/14/2016  Glucose 70 - 140 mg/dl 104 163(H) 86  BUN 7.0 - 26.0 mg/dL 28.1(H) 30(H) 26.1(H)  Creatinine 0.7 - 1.3 mg/dL 1.2 1.16 1.2  Sodium 136 - 145 mEq/L 137 138 139  Potassium 3.5 - 5.1 mEq/L 4.7 4.3 4.9  Chloride 101 - 111 mmol/L - 105 -  CO2 22 - 29 mEq/L 26 26 28   Calcium 8.4 - 10.4 mg/dL 8.9 8.9 9.2  Total Protein 6.4 - 8.3 g/dL 5.7(L) - 6.3(L)  Total Bilirubin 0.20 - 1.20 mg/dL 0.62 - 0.70  Alkaline Phos 40 - 150 U/L 74 - 91  AST 5 - 34 U/L 15 - 21  ALT 0 - 55 U/L 16 - 15    RADIOGRAPHIC STUDIES: No results found.   ASSESSMENT: [name] [name] 83 y.o. male with a history of Polycythemia vera ([place])   PLAN:  1. Polycythemia vera diagnosed in 1997, JAK2 mutation positive.  -We reviewed the nature history of PV, most people do very well, some people would develop myelofibrosis or leukemia late on. The major complication is thrombosis. -He has been on Hydrea 500 mg daily for 4-5 years, due to the worsening thrombocytopenia, I decreased to 500 mg 4 days a week (M, W, F, Sat and Sun) -His thrombocytopenia has improved, but his WBC has increased to 20K's since we decreased his Hydrea. H/H also slightly increased. So I increased his Hydrea to 500 mg daily except Monday and Friday. -Lab reviewed, hematocrit 46.7%  (09/14/16), he received a phlebotomy . -he will continue Hydrea 500 mg daily except Monday and Friday  -We discussed at length that Hydrea will slow down wound healing. If his left lower extremity blister becomes a wound, and does not he well, I may consider holding Hydrea for short period time. -He is on Xarelto now. -Labs reviewed and his counts are high, HCT 47.5%. He will get phlebotomy today. CMP is normal. -He has been having phlebotomy every 2-4 months, he is able to tolerate them well. I will increase hydrea to 6 days a week to lower the need of phlebotomies. Will f/u in 9 weeks to monitor tolerance.  -will monitor her plt counts closely   2 RLE DVT, LLE DVT in 02/2015, likely secondary to #1 -I recommend him to continue Xarelto indefinitely, due to recurrent DVT. -He knows to be careful about fall and injury -He had vascular surgery on his left leg on 09/04/16 for poor circulation. Laser ablation of the left great saphenous vein from the proximal calf to near the saphenofemoral junction performed under local tumescent anesthesia by Dr. Kellie Simmering.  3.  Left ankle wound  -healed now   4. Left low abdomen and grion pain -Pt has pain and limps with walk, exam showed LUQ abdominal tenderness, no lymphadenopathy in the groin area -Has seen PCP and orthopedic surgeon, received injection for pain but did not resolve.  -Based on PE I think his pain is related to GI, I felt no lymph nodes -I suggest he contact Dr. Sallye Lat for Sigmoidoscopy and f/u with PCP   PLAN:  -Phlebotomy today -Increase Hydrea to 500 mg daily from 5 days/week to 6 days a week -Lab every 3 weeksX3  and plembotomy if HCT>45%. F/u in 9 weeks with phlebotomy appointment  -I will fax my note to Dr. Evelena Leyden office   All questions were answered. The patient knows to call the clinic with any problems, questions or concerns. We can certainly see the patient much sooner if necessary.  I spent 20 minutes counseling the patient  face to face. The total time spent in the appointment was 25 minutes.    Truitt Merle, MD 02/08/2017     This document serves as a record of services personally performed by Truitt Merle, MD. It was created on her behalf by Joslyn Devon, a trained medical scribe. The creation of this record is based on the scribe's personal observations and the provider's statements to them. This document has been checked and approved by the attending provider.

## 2017-02-05 ENCOUNTER — Ambulatory Visit (INDEPENDENT_AMBULATORY_CARE_PROVIDER_SITE_OTHER): Payer: Medicare Other | Admitting: Orthopaedic Surgery

## 2017-02-05 DIAGNOSIS — M1612 Unilateral primary osteoarthritis, left hip: Secondary | ICD-10-CM | POA: Diagnosis not present

## 2017-02-05 NOTE — Progress Notes (Signed)
The patient is here for follow-up after an intra-articular steroid injection in his left hip under fluoroscopy by Dr. Ernestina Patches. He says that is helped his leg pain but not the groin pain. He has known severe osteoarthritis of that left hip. He feels like this may be more of an intestinal problem or even a urologic problem because there are a lot of other anatomic things he states they could be going on. He understands that he does have bad arthritis in his left hip.  On examination of left hip itself internal/external rotation causes some pain in the groin. The x-rays again show severe osteoarthritis and degenerative joint disease of left hip.  At this point I have no other options for him other than do nothing versus considering a total hip replacement at some point if it bothers him enough. He said he is noted or other avenues as well. All questions were encouraged and answered.

## 2017-02-08 ENCOUNTER — Other Ambulatory Visit (HOSPITAL_BASED_OUTPATIENT_CLINIC_OR_DEPARTMENT_OTHER): Payer: Medicare Other

## 2017-02-08 ENCOUNTER — Ambulatory Visit: Payer: Medicare Other

## 2017-02-08 ENCOUNTER — Encounter: Payer: Self-pay | Admitting: Hematology

## 2017-02-08 ENCOUNTER — Ambulatory Visit (HOSPITAL_BASED_OUTPATIENT_CLINIC_OR_DEPARTMENT_OTHER): Payer: Medicare Other | Admitting: Hematology

## 2017-02-08 VITALS — BP 124/70 | HR 77 | Temp 99.0°F | Resp 20

## 2017-02-08 VITALS — BP 129/64 | HR 86 | Temp 98.0°F | Resp 19 | Ht 71.0 in | Wt 179.5 lb

## 2017-02-08 DIAGNOSIS — D45 Polycythemia vera: Secondary | ICD-10-CM

## 2017-02-08 DIAGNOSIS — I82401 Acute embolism and thrombosis of unspecified deep veins of right lower extremity: Secondary | ICD-10-CM | POA: Diagnosis not present

## 2017-02-08 DIAGNOSIS — R100 Acute abdomen: Secondary | ICD-10-CM | POA: Diagnosis not present

## 2017-02-08 DIAGNOSIS — R102 Pelvic and perineal pain: Secondary | ICD-10-CM

## 2017-02-08 DIAGNOSIS — I82402 Acute embolism and thrombosis of unspecified deep veins of left lower extremity: Secondary | ICD-10-CM | POA: Diagnosis not present

## 2017-02-08 LAB — COMPREHENSIVE METABOLIC PANEL
ALT: 16 U/L (ref 0–55)
AST: 15 U/L (ref 5–34)
Albumin: 3.6 g/dL (ref 3.5–5.0)
Alkaline Phosphatase: 74 U/L (ref 40–150)
Anion Gap: 8 mEq/L (ref 3–11)
BUN: 28.1 mg/dL — AB (ref 7.0–26.0)
CHLORIDE: 102 meq/L (ref 98–109)
CO2: 26 meq/L (ref 22–29)
Calcium: 8.9 mg/dL (ref 8.4–10.4)
Creatinine: 1.2 mg/dL (ref 0.7–1.3)
EGFR: 51 mL/min/{1.73_m2} — ABNORMAL LOW (ref >90)
GLUCOSE: 104 mg/dL (ref 70–140)
POTASSIUM: 4.7 meq/L (ref 3.5–5.1)
SODIUM: 137 meq/L (ref 136–145)
Total Bilirubin: 0.62 mg/dL (ref 0.20–1.20)
Total Protein: 5.7 g/dL — ABNORMAL LOW (ref 6.4–8.3)

## 2017-02-08 LAB — CBC WITH DIFFERENTIAL/PLATELET
BASO%: 0.6 % (ref 0.0–2.0)
BASOS ABS: 0.2 10*3/uL — AB (ref 0.0–0.1)
EOS%: 1.4 % (ref 0.0–7.0)
Eosinophils Absolute: 0.4 10*3/uL (ref 0.0–0.5)
HCT: 47.5 % (ref 38.4–49.9)
HGB: 14.8 g/dL (ref 13.0–17.1)
LYMPH%: 4.8 % — ABNORMAL LOW (ref 14.0–49.0)
MCH: 29.2 pg (ref 27.2–33.4)
MCHC: 31.2 g/dL — AB (ref 32.0–36.0)
MCV: 93.9 fL (ref 79.3–98.0)
MONO#: 1.3 10*3/uL — ABNORMAL HIGH (ref 0.1–0.9)
MONO%: 4.9 % (ref 0.0–14.0)
NEUT#: 22.7 10*3/uL — ABNORMAL HIGH (ref 1.5–6.5)
NEUT%: 88.3 % — AB (ref 39.0–75.0)
Platelets: 109 10*3/uL — ABNORMAL LOW (ref 140–400)
RBC: 5.06 10*6/uL (ref 4.20–5.82)
RDW: 15.6 % — ABNORMAL HIGH (ref 11.0–14.6)
WBC: 25.7 10*3/uL — ABNORMAL HIGH (ref 4.0–10.3)
lymph#: 1.2 10*3/uL (ref 0.9–3.3)

## 2017-02-08 NOTE — Progress Notes (Signed)
[name] [name] presents today for phlebotomy per MD orders. Phlebotomy procedure started at 1345 and ended at 1355. 500 grams removed via 16 gauge to L AC. Patient observed for 30 minutes after procedure without any incident. Patient tolerated procedure well. IV needle removed intact. Pt offered refreshments.

## 2017-02-08 NOTE — Patient Instructions (Signed)

## 2017-02-09 ENCOUNTER — Encounter: Payer: Self-pay | Admitting: Hematology

## 2017-02-23 ENCOUNTER — Other Ambulatory Visit: Payer: Self-pay | Admitting: Internal Medicine

## 2017-02-23 DIAGNOSIS — I10 Essential (primary) hypertension: Secondary | ICD-10-CM | POA: Diagnosis not present

## 2017-02-23 DIAGNOSIS — R1032 Left lower quadrant pain: Secondary | ICD-10-CM

## 2017-02-23 DIAGNOSIS — R1904 Left lower quadrant abdominal swelling, mass and lump: Secondary | ICD-10-CM

## 2017-02-23 DIAGNOSIS — D45 Polycythemia vera: Secondary | ICD-10-CM | POA: Diagnosis not present

## 2017-02-23 DIAGNOSIS — Z6825 Body mass index (BMI) 25.0-25.9, adult: Secondary | ICD-10-CM | POA: Diagnosis not present

## 2017-02-23 DIAGNOSIS — M25552 Pain in left hip: Secondary | ICD-10-CM | POA: Diagnosis not present

## 2017-02-26 ENCOUNTER — Inpatient Hospital Stay: Admission: RE | Admit: 2017-02-26 | Payer: Medicare Other | Source: Ambulatory Visit

## 2017-03-01 ENCOUNTER — Ambulatory Visit: Payer: Medicare Other

## 2017-03-01 ENCOUNTER — Other Ambulatory Visit (HOSPITAL_BASED_OUTPATIENT_CLINIC_OR_DEPARTMENT_OTHER): Payer: Medicare Other

## 2017-03-01 DIAGNOSIS — D45 Polycythemia vera: Secondary | ICD-10-CM | POA: Diagnosis present

## 2017-03-01 LAB — CBC WITH DIFFERENTIAL/PLATELET
BASO%: 1 % (ref 0.0–2.0)
Basophils Absolute: 0.2 10*3/uL — ABNORMAL HIGH (ref 0.0–0.1)
EOS%: 1.1 % (ref 0.0–7.0)
Eosinophils Absolute: 0.3 10*3/uL (ref 0.0–0.5)
HCT: 47 % (ref 38.4–49.9)
HGB: 14.6 g/dL (ref 13.0–17.1)
LYMPH%: 5.9 % — ABNORMAL LOW (ref 14.0–49.0)
MCH: 29.6 pg (ref 27.2–33.4)
MCHC: 31.1 g/dL — ABNORMAL LOW (ref 32.0–36.0)
MCV: 95.3 fL (ref 79.3–98.0)
MONO#: 1.1 10*3/uL — ABNORMAL HIGH (ref 0.1–0.9)
MONO%: 4.9 % (ref 0.0–14.0)
NEUT#: 19.4 10*3/uL — ABNORMAL HIGH (ref 1.5–6.5)
NEUT%: 87.1 % — ABNORMAL HIGH (ref 39.0–75.0)
Platelets: 165 10*3/uL (ref 140–400)
RBC: 4.93 10*6/uL (ref 4.20–5.82)
RDW: 16.8 % — ABNORMAL HIGH (ref 11.0–14.6)
WBC: 22.3 10*3/uL — ABNORMAL HIGH (ref 4.0–10.3)
lymph#: 1.3 10*3/uL (ref 0.9–3.3)
nRBC: 0 % (ref 0–0)

## 2017-03-01 NOTE — Progress Notes (Signed)
Pt arrived for phlebotomy today, Hct 47.  Pt A&O, no complaints of nausea/vomiting, diarrhea, lightheadedness, dizziness, etc.  BP 93/60 &  96/65.  Pt stated he took BP medication this morning.  Pt stated he has been drinking fluids at home.  Reviewed with Dr. Burr Medico.  Per Dr. Burr Medico, will hold phlebotomy today.  Pt to return in 3 weeks as scheduled.  Pt instructed to push oral fluids at home.  Pt has no home BP monitor.  Pt instructed to check BP daily at local drug store or obtain BP monitor.  Advised to touch base with PCP regarding BP.  Pt advised to return in 3 weeks as scheduled & to notify MD for increased lightheadedness/dizziness.  Pt verbalized understanding.

## 2017-03-02 ENCOUNTER — Other Ambulatory Visit: Payer: Self-pay | Admitting: Internal Medicine

## 2017-03-02 DIAGNOSIS — R1032 Left lower quadrant pain: Secondary | ICD-10-CM

## 2017-03-02 DIAGNOSIS — R1904 Left lower quadrant abdominal swelling, mass and lump: Secondary | ICD-10-CM

## 2017-03-06 ENCOUNTER — Other Ambulatory Visit: Payer: Medicare Other

## 2017-03-07 ENCOUNTER — Ambulatory Visit
Admission: RE | Admit: 2017-03-07 | Discharge: 2017-03-07 | Disposition: A | Discharge status: Home / Self Care | Payer: Medicare Other | Source: Ambulatory Visit | Attending: Internal Medicine | Admitting: Internal Medicine

## 2017-03-07 DIAGNOSIS — K4090 Unilateral inguinal hernia, without obstruction or gangrene, not specified as recurrent: Secondary | ICD-10-CM | POA: Diagnosis not present

## 2017-03-07 DIAGNOSIS — R1904 Left lower quadrant abdominal swelling, mass and lump: Secondary | ICD-10-CM

## 2017-03-07 DIAGNOSIS — N400 Enlarged prostate without lower urinary tract symptoms: Secondary | ICD-10-CM | POA: Diagnosis not present

## 2017-03-07 DIAGNOSIS — R1032 Left lower quadrant pain: Secondary | ICD-10-CM

## 2017-03-07 MED ORDER — IOPAMIDOL (ISOVUE-300) INJECTION 61%
100.0000 mL | Freq: Once | INTRAVENOUS | Status: AC | PRN
  Administered 2017-03-07: 100 mL via INTRAVENOUS

## 2017-03-17 ENCOUNTER — Other Ambulatory Visit: Payer: Self-pay | Admitting: Nurse Practitioner

## 2017-03-22 ENCOUNTER — Other Ambulatory Visit (HOSPITAL_BASED_OUTPATIENT_CLINIC_OR_DEPARTMENT_OTHER): Payer: Medicare Other

## 2017-03-22 ENCOUNTER — Ambulatory Visit (HOSPITAL_BASED_OUTPATIENT_CLINIC_OR_DEPARTMENT_OTHER): Payer: Medicare Other

## 2017-03-22 VITALS — BP 105/68 | HR 76 | Temp 98.1°F | Resp 16

## 2017-03-22 DIAGNOSIS — D45 Polycythemia vera: Secondary | ICD-10-CM | POA: Diagnosis present

## 2017-03-22 LAB — CBC WITH DIFFERENTIAL/PLATELET
BASO%: 1.4 % (ref 0.0–2.0)
Basophils Absolute: 0.3 10*3/uL — ABNORMAL HIGH (ref 0.0–0.1)
EOS%: 1 % (ref 0.0–7.0)
Eosinophils Absolute: 0.2 10*3/uL (ref 0.0–0.5)
HEMATOCRIT: 46.7 % (ref 38.4–49.9)
HGB: 14.5 g/dL (ref 13.0–17.1)
LYMPH%: 7.5 % — AB (ref 14.0–49.0)
MCH: 29.5 pg (ref 27.2–33.4)
MCHC: 31 g/dL — AB (ref 32.0–36.0)
MCV: 94.9 fL (ref 79.3–98.0)
MONO#: 0.9 10*3/uL (ref 0.1–0.9)
MONO%: 4.7 % (ref 0.0–14.0)
NEUT%: 85.4 % — AB (ref 39.0–75.0)
NEUTROS ABS: 16.9 10*3/uL — AB (ref 1.5–6.5)
Platelets: 123 10*3/uL — ABNORMAL LOW (ref 140–400)
RBC: 4.92 10*6/uL (ref 4.20–5.82)
RDW: 16.2 % — ABNORMAL HIGH (ref 11.0–14.6)
WBC: 19.8 10*3/uL — AB (ref 4.0–10.3)
lymph#: 1.5 10*3/uL (ref 0.9–3.3)
nRBC: 0 % (ref 0–0)

## 2017-03-22 NOTE — Patient Instructions (Signed)

## 2017-03-22 NOTE — Progress Notes (Signed)
Per MD order, goal of phlebotomy to keep HCT < 45. HCT 46.7: therapeutic phlebotomy indicated. Patient voiced understanding. Food and drink requested before phlebotomy since patient had not eaten yet. 16gauge to [place]. 551 grams phlebotomized between 1246 - 1252. Patient tolerated procedure well. No complaints via patient. 12min observation initiated. More oral fluids provided.  Patient stable upon discharge. Vitals signs stable. No complaints via patient. Discharge instructions provided.   Wylene Simmer, BSN, RN 03/22/2017 1:25 PM

## 2017-04-06 DIAGNOSIS — K4090 Unilateral inguinal hernia, without obstruction or gangrene, not specified as recurrent: Secondary | ICD-10-CM | POA: Diagnosis not present

## 2017-04-12 ENCOUNTER — Ambulatory Visit (HOSPITAL_BASED_OUTPATIENT_CLINIC_OR_DEPARTMENT_OTHER): Payer: Medicare Other | Admitting: Hematology

## 2017-04-12 ENCOUNTER — Encounter: Payer: Self-pay | Admitting: Hematology

## 2017-04-12 ENCOUNTER — Telehealth: Payer: Self-pay | Admitting: Hematology

## 2017-04-12 ENCOUNTER — Other Ambulatory Visit (HOSPITAL_BASED_OUTPATIENT_CLINIC_OR_DEPARTMENT_OTHER): Payer: Medicare Other

## 2017-04-12 VITALS — BP 144/76 | HR 73 | Temp 98.0°F | Resp 17 | Ht 71.0 in | Wt 181.4 lb

## 2017-04-12 DIAGNOSIS — I82401 Acute embolism and thrombosis of unspecified deep veins of right lower extremity: Secondary | ICD-10-CM | POA: Diagnosis not present

## 2017-04-12 DIAGNOSIS — D45 Polycythemia vera: Secondary | ICD-10-CM

## 2017-04-12 DIAGNOSIS — R1012 Left upper quadrant pain: Secondary | ICD-10-CM

## 2017-04-12 DIAGNOSIS — I82402 Acute embolism and thrombosis of unspecified deep veins of left lower extremity: Secondary | ICD-10-CM | POA: Diagnosis not present

## 2017-04-12 LAB — CBC WITH DIFFERENTIAL/PLATELET
BASO%: 1.6 % (ref 0.0–2.0)
Basophils Absolute: 0.3 10*3/uL — ABNORMAL HIGH (ref 0.0–0.1)
EOS%: 0.9 % (ref 0.0–7.0)
Eosinophils Absolute: 0.2 10*3/uL (ref 0.0–0.5)
HEMATOCRIT: 45.7 % (ref 38.4–49.9)
HEMOGLOBIN: 14.2 g/dL (ref 13.0–17.1)
LYMPH#: 1.3 10*3/uL (ref 0.9–3.3)
LYMPH%: 6.2 % — AB (ref 14.0–49.0)
MCH: 29.8 pg (ref 27.2–33.4)
MCHC: 31.1 g/dL — AB (ref 32.0–36.0)
MCV: 96 fL (ref 79.3–98.0)
MONO#: 1.1 10*3/uL — AB (ref 0.1–0.9)
MONO%: 5.3 % (ref 0.0–14.0)
NEUT%: 86 % — ABNORMAL HIGH (ref 39.0–75.0)
NEUTROS ABS: 18.1 10*3/uL — AB (ref 1.5–6.5)
Platelets: 102 10*3/uL — ABNORMAL LOW (ref 140–400)
RBC: 4.76 10*6/uL (ref 4.20–5.82)
RDW: 16.3 % — AB (ref 11.0–14.6)
WBC: 21.1 10*3/uL — AB (ref 4.0–10.3)
nRBC: 0 % (ref 0–0)
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
## 2017-04-12 NOTE — Telephone Encounter (Signed)
Gave avs and calendar for October - December  °

## 2017-05-07 DIAGNOSIS — R7302 Impaired glucose tolerance (oral): Secondary | ICD-10-CM | POA: Diagnosis not present

## 2017-05-07 DIAGNOSIS — Z6826 Body mass index (BMI) 26.0-26.9, adult: Secondary | ICD-10-CM | POA: Diagnosis not present

## 2017-05-07 DIAGNOSIS — Z23 Encounter for immunization: Secondary | ICD-10-CM | POA: Diagnosis not present

## 2017-05-07 DIAGNOSIS — R1032 Left lower quadrant pain: Secondary | ICD-10-CM | POA: Diagnosis not present

## 2017-05-07 DIAGNOSIS — I8290 Acute embolism and thrombosis of unspecified vein: Secondary | ICD-10-CM | POA: Diagnosis not present

## 2017-05-07 DIAGNOSIS — I10 Essential (primary) hypertension: Secondary | ICD-10-CM | POA: Diagnosis not present

## 2017-05-07 DIAGNOSIS — D45 Polycythemia vera: Secondary | ICD-10-CM | POA: Diagnosis not present

## 2017-05-10 ENCOUNTER — Other Ambulatory Visit (HOSPITAL_BASED_OUTPATIENT_CLINIC_OR_DEPARTMENT_OTHER): Payer: Medicare Other

## 2017-05-10 ENCOUNTER — Telehealth: Payer: Self-pay

## 2017-05-10 ENCOUNTER — Ambulatory Visit (HOSPITAL_BASED_OUTPATIENT_CLINIC_OR_DEPARTMENT_OTHER): Payer: Medicare Other

## 2017-05-10 DIAGNOSIS — D45 Polycythemia vera: Secondary | ICD-10-CM | POA: Diagnosis present

## 2017-05-10 LAB — COMPREHENSIVE METABOLIC PANEL
ALBUMIN: 3.7 g/dL (ref 3.5–5.0)
ALK PHOS: 71 U/L (ref 40–150)
ALT: 11 U/L (ref 0–55)
AST: 19 U/L (ref 5–34)
Anion Gap: 9 mEq/L (ref 3–11)
BUN: 26.6 mg/dL — AB (ref 7.0–26.0)
CALCIUM: 8.9 mg/dL (ref 8.4–10.4)
CHLORIDE: 104 meq/L (ref 98–109)
CO2: 25 mEq/L (ref 22–29)
CREATININE: 1.3 mg/dL (ref 0.7–1.3)
EGFR: 48 mL/min/{1.73_m2} — ABNORMAL LOW (ref >60)
Glucose: 129 mg/dl (ref 70–140)
Potassium: 4.8 mEq/L (ref 3.5–5.1)
Sodium: 137 mEq/L (ref 136–145)
Total Bilirubin: 0.57 mg/dL (ref 0.20–1.20)
Total Protein: 5.9 g/dL — ABNORMAL LOW (ref 6.4–8.3)

## 2017-05-10 LAB — CBC WITH DIFFERENTIAL/PLATELET
BASO%: 1.2 % (ref 0.0–2.0)
Basophils Absolute: 0.2 10*3/uL — ABNORMAL HIGH (ref 0.0–0.1)
EOS%: 0.9 % (ref 0.0–7.0)
Eosinophils Absolute: 0.2 10*3/uL (ref 0.0–0.5)
HEMATOCRIT: 46.8 % (ref 38.4–49.9)
HEMOGLOBIN: 14.3 g/dL (ref 13.0–17.1)
LYMPH#: 1.4 10*3/uL (ref 0.9–3.3)
LYMPH%: 7 % — ABNORMAL LOW (ref 14.0–49.0)
MCH: 30.3 pg (ref 27.2–33.4)
MCHC: 30.6 g/dL — ABNORMAL LOW (ref 32.0–36.0)
MCV: 99.2 fL — ABNORMAL HIGH (ref 79.3–98.0)
MONO#: 1 10*3/uL — ABNORMAL HIGH (ref 0.1–0.9)
MONO%: 4.8 % (ref 0.0–14.0)
NEUT#: 17.1 10*3/uL — ABNORMAL HIGH (ref 1.5–6.5)
NEUT%: 86.1 % — ABNORMAL HIGH (ref 39.0–75.0)
Platelets: 97 10*3/uL — ABNORMAL LOW (ref 140–400)
RBC: 4.72 10*6/uL (ref 4.20–5.82)
RDW: 15.7 % — AB (ref 11.0–14.6)
WBC: 19.9 10*3/uL — ABNORMAL HIGH (ref 4.0–10.3)

## 2017-05-10 NOTE — Progress Notes (Signed)
Therapeutic phlebotomy performed over 7 minutes using a 16 gauge phlebotomy set to the left AC. Patient tolerated well. Nourishment provided.   Patient refused to stay for the entire 30 minute observation period post phlebotomy. Patient blood pressure 98/62 upon discharge. Patient states his blood pressure decreases upon sitting for a long period of time. Patient denies dizziness, faintness or vertigo. Patient states "I'm fine" and verbalized wanting to go home. Patient discharged ambulatory with his sister-in-law accompanying.

## 2017-05-10 NOTE — Patient Instructions (Signed)
Therapeutic Phlebotomy Therapeutic phlebotomy is the controlled removal of blood from a person's body for the purpose of treating a medical condition. The procedure is similar to donating blood. Usually, about a pint (470 mL, or 0.47L) of blood is removed. The average adult has 9-12 pints (4.3-5.7 L) of blood. Therapeutic phlebotomy may be used to treat the following medical conditions:  Hemochromatosis. This is a condition in which the blood contains too much iron.  Polycythemia vera. This is a condition in which the blood contains too many red blood cells.  Porphyria cutanea tarda. This is a disease in which an important part of hemoglobin is not made properly. It results in the buildup of abnormal amounts of porphyrins in the body.  Sickle cell disease. This is a condition in which the red blood cells form an abnormal crescent shape rather than a round shape.  Tell a health care provider about:  Any allergies you have.  All medicines you are taking, including vitamins, herbs, eye drops, creams, and over-the-counter medicines.  Any problems you or family members have had with anesthetic medicines.  Any blood disorders you have.  Any surgeries you have had.  Any medical conditions you have. What are the risks? Generally, this is a safe procedure. However, problems may occur, including:  Nausea or light-headedness.  Low blood pressure.  Soreness, bleeding, swelling, or bruising at the needle insertion site.  Infection.  What happens before the procedure?  Follow instructions from your health care provider about eating or drinking restrictions.  Ask your health care provider about changing or stopping your regular medicines. This is especially important if you are taking diabetes medicines or blood thinners.  Wear clothing with sleeves that can be raised above the elbow.  Plan to have someone take you home after the procedure.  You may have a blood sample taken. What  happens during the procedure?  A needle will be inserted into one of your veins.  Tubing and a collection bag will be attached to that needle.  Blood will flow through the needle and tubing into the collection bag.  You may be asked to open and close your hand slowly and continually during the entire collection.  After the specified amount of blood has been removed from your body, the collection bag and tubing will be clamped.  The needle will be removed from your vein.  Pressure will be held on the site of the needle insertion to stop the bleeding.  A bandage (dressing) will be placed over the needle insertion site. The procedure may vary among health care providers and hospitals. What happens after the procedure?  Your recovery will be assessed and monitored.  You can return to your normal activities as directed by your health care provider. This information is not intended to replace advice given to you by your health care provider. Make sure you discuss any questions you have with your health care provider. Document Released: 12/05/2010 Document Revised: 03/04/2016 Document Reviewed: 06/29/2014 Elsevier Interactive Patient Education  2018 [employer]  

## 2017-05-10 NOTE — Telephone Encounter (Signed)
Patient requested a schedule change to place f/u appointment with same day as lab. Per 10/25 patient request

## 2017-05-29 ENCOUNTER — Telehealth: Payer: Self-pay | Admitting: Hematology

## 2017-05-29 NOTE — Telephone Encounter (Signed)
Patient called in to see when his next appointment was

## 2017-05-31 ENCOUNTER — Other Ambulatory Visit: Payer: Medicare Other

## 2017-06-06 ENCOUNTER — Ambulatory Visit (HOSPITAL_BASED_OUTPATIENT_CLINIC_OR_DEPARTMENT_OTHER): Payer: Medicare Other

## 2017-06-06 ENCOUNTER — Other Ambulatory Visit (HOSPITAL_BASED_OUTPATIENT_CLINIC_OR_DEPARTMENT_OTHER): Payer: Medicare Other

## 2017-06-06 DIAGNOSIS — D45 Polycythemia vera: Secondary | ICD-10-CM

## 2017-06-06 LAB — CBC WITH DIFFERENTIAL/PLATELET
BASO%: 1.5 % (ref 0.0–2.0)
Basophils Absolute: 0.2 10*3/uL — ABNORMAL HIGH (ref 0.0–0.1)
EOS ABS: 0.2 10*3/uL (ref 0.0–0.5)
EOS%: 1 % (ref 0.0–7.0)
HEMATOCRIT: 45.7 % (ref 38.4–49.9)
HEMOGLOBIN: 14.2 g/dL (ref 13.0–17.1)
LYMPH#: 1.4 10*3/uL (ref 0.9–3.3)
LYMPH%: 8.4 % — AB (ref 14.0–49.0)
MCH: 30.7 pg (ref 27.2–33.4)
MCHC: 31.1 g/dL — AB (ref 32.0–36.0)
MCV: 98.9 fL — ABNORMAL HIGH (ref 79.3–98.0)
MONO#: 0.9 10*3/uL (ref 0.1–0.9)
MONO%: 5.7 % (ref 0.0–14.0)
NEUT%: 83.4 % — AB (ref 39.0–75.0)
NEUTROS ABS: 13.7 10*3/uL — AB (ref 1.5–6.5)
PLATELETS: 93 10*3/uL — AB (ref 140–400)
RBC: 4.62 10*6/uL (ref 4.20–5.82)
RDW: 15.3 % — ABNORMAL HIGH (ref 11.0–14.6)
WBC: 16.4 10*3/uL — AB (ref 4.0–10.3)
nRBC: 0 % (ref 0–0)

## 2017-06-06 NOTE — Patient Instructions (Signed)
Therapeutic Phlebotomy Therapeutic phlebotomy is the controlled removal of blood from a person's body for the purpose of treating a medical condition. The procedure is similar to donating blood. Usually, about a pint (470 mL, or 0.47L) of blood is removed. The average adult has 9-12 pints (4.3-5.7 L) of blood. Therapeutic phlebotomy may be used to treat the following medical conditions:  Hemochromatosis. This is a condition in which the blood contains too much iron.  Polycythemia vera. This is a condition in which the blood contains too many red blood cells.  Porphyria cutanea tarda. This is a disease in which an important part of hemoglobin is not made properly. It results in the buildup of abnormal amounts of porphyrins in the body.  Sickle cell disease. This is a condition in which the red blood cells form an abnormal crescent shape rather than a round shape.  Tell a health care provider about:  Any allergies you have.  All medicines you are taking, including vitamins, herbs, eye drops, creams, and over-the-counter medicines.  Any problems you or family members have had with anesthetic medicines.  Any blood disorders you have.  Any surgeries you have had.  Any medical conditions you have. What are the risks? Generally, this is a safe procedure. However, problems may occur, including:  Nausea or light-headedness.  Low blood pressure.  Soreness, bleeding, swelling, or bruising at the needle insertion site.  Infection.  What happens before the procedure?  Follow instructions from your health care provider about eating or drinking restrictions.  Ask your health care provider about changing or stopping your regular medicines. This is especially important if you are taking diabetes medicines or blood thinners.  Wear clothing with sleeves that can be raised above the elbow.  Plan to have someone take you home after the procedure.  You may have a blood sample taken. What  happens during the procedure?  A needle will be inserted into one of your veins.  Tubing and a collection bag will be attached to that needle.  Blood will flow through the needle and tubing into the collection bag.  You may be asked to open and close your hand slowly and continually during the entire collection.  After the specified amount of blood has been removed from your body, the collection bag and tubing will be clamped.  The needle will be removed from your vein.  Pressure will be held on the site of the needle insertion to stop the bleeding.  A bandage (dressing) will be placed over the needle insertion site. The procedure may vary among health care providers and hospitals. What happens after the procedure?  Your recovery will be assessed and monitored.  You can return to your normal activities as directed by your health care provider. This information is not intended to replace advice given to you by your health care provider. Make sure you discuss any questions you have with your health care provider. Document Released: 12/05/2010 Document Revised: 03/04/2016 Document Reviewed: 06/29/2014 Elsevier Interactive Patient Education  2018 [employer]  

## 2017-06-27 NOTE — Progress Notes (Signed)
[facility] OFFICE PROGRESS NOTE  [name], MD [place] [address]  DIAGNOSIS: Polycythemia vera ([place])   CURRENT TREATMENT:   1. Phlebotomy as needed if HCT>45%.  2. Hydrea decreased to 500mg  daily 4-5 days/week since 02/2015; increased to take hydrea 6 days/week starting 02/08/17 3. Xarelto 20 mg started about mid-February 2015 and off after 6 month, restarted again in 02/2015 due to left LE DVT  INTERVAL HISTORY:  [name] [name] 83 y.o. male with a history of Polycythemia vera dating back to 1997, JAK2 mutation positive is here for follow up. He presents to the clinic today accompanied by nurse aid.   He reports he feels very fatigued with no coughing or chest pain. He has SOB upon walking around and would like to get his lungs checked. He notes he had food poisoning last mouth along with his wife. He was sick with vomiting, diarrhea and dehydration for 3 days. He takes care of his wife so he was not able to leave for IV Fluids.  He has recovered from that episode other than fatigue.  He notes his hip is still bad. He had a cortisone shot 4 months ago so pain is returning. He doesn't wish to ger a hip replacement.  He denies anymore sores. His skin is dry. He reports he has a tendency to get warts, but none currently. He has bruising along arm due to helping his wife move around. He is still taking Hydrea 5 days a week, tolerating well. He is aware of his heart murmur. He had a cardiac workup previously and was told it was stable. He last had a phlebotomy at last visit. He has tolerated them well with some fatigue right after procedure.     MEDICAL HISTORY: Past Medical History:  Diagnosis Date  . Bone marrow disease   . BPH (benign prostatic hypertrophy)   . Depression   . DJD (degenerative joint disease)   . Dyslipidemia   . Gout   . HTN (hypertension)   . Mild aortic stenosis   . Polycythemia vera(238.4)     INTERIM HISTORY: has  Polycythemia vera ([place]); Aneurysm of unspecified site [facility]); DVT (deep venous thrombosis) ([place]); Thrombocytopenia ([place]); Varicose veins of bilateral lower extremities with other complications; Unilateral primary osteoarthritis, left hip; and Pain in left hip on their problem list.    ALLERGIES:  is allergic to amoxicillin and colchicine.  MEDICATIONS: has a current medication list which includes the following prescription(s): acetaminophen, allopurinol, alprazolam, escitalopram, fish oil-omega-3 fatty acids, hydroxyurea, lisinopril, multivitamin, rivaroxaban, terazosin, tramadol, and vitamin e.  SURGICAL HISTORY:  Past Surgical History:  Procedure Laterality Date  . ENDOVENOUS ABLATION SAPHENOUS VEIN W/ LASER Left 09/04/2016   endovenous laser ablation left greater saphenous vein by Tinnie Gens MD  . HEMORROIDECTOMY      REVIEW OF SYSTEMS:   Constitutional: Denies fevers, chills or abnormal weight loss (+)significant fatigue  Eyes: Denies blurriness of vision Ears, nose, mouth, throat, and face: Denies mucositis or sore throat Respiratory: (+) SOB upon walking/exertion  Cardiovascular: Denies palpitation, chest discomfort or lower extremity swelling (+) heart murmur Gastrointestinal:  Denies nausea, heartburn or change in bowel habits  Skin: Denies abnormal skin rashes (+) dry skin (+)bruising of arms Lymphatics: Denies new lymphadenopathy or easy bruising Neurological:Denies numbness, tingling or new weaknesses MSK: (+) Hip pain Behavioral/Psych: Mood is stable, no new changes  All other systems were reviewed with the patient and are negative.  PHYSICAL EXAMINATION:  ECOG  PERFORMANCE STATUS: 1  Blood pressure (!) 160/71, pulse 70, temperature 98.7 F (37.1 C), temperature source Oral, resp. rate 20, height 5\' 11"  (1.803 m), weight 176 lb 6.4 oz (80 kg), SpO2 97 %.  General: Easily ambulatory.  HEENT: Sclerae anicteric. Conjunctivae were pink. Pupils round and reactive  bilaterally. Oral mucosa is moist without ulceration or thrush.  No occipital, submandibular, cervical, supraclavicular or axillar adenopathy.  Lungs: clear to auscultation without wheezes. No rales or rhonchi.  Heart: regular rate. No gallop or rubs. (+) heart murmur  Abdomen: soft No guarding or rebound tenderness. No palpable hepatosplenomegaly. Extremities: No clubbing or cyanosis.No calf tenderness to palpitation, no peripheral edema. The patient had grossly intact strength in upper and lower extremities.  Skin: Diffuse erythema and skin pigmentation in the left lower extremity above the ankle and he has a 2 x 2 cm blister above the ankle not draining. Neuro: non-focal, alert and oriented to time, person and place, appropriate affect   Labs:  CBC Latest Ref Rng & Units 06/28/2017 06/06/2017 05/10/2017  WBC 4.0 - 10.3 10e3/uL 15.0(H) 16.4(H) 19.9(H)  Hemoglobin 13.0 - 17.1 g/dL 13.7 14.2 14.3  Hematocrit 38.4 - 49.9 % 44.6 45.7 46.8  Platelets 140 - 400 10e3/uL 98(L) 93(L) 97(L)   CMP Latest Ref Rng & Units 05/10/2017 02/08/2017 10/19/2016  Glucose 70 - 140 mg/dl 129 104 163(H)  BUN 7.0 - 26.0 mg/dL 26.6(H) 28.1(H) 30(H)  Creatinine 0.7 - 1.3 mg/dL 1.3 1.2 1.16  Sodium 136 - 145 mEq/L 137 137 138  Potassium 3.5 - 5.1 mEq/L 4.8 4.7 4.3  Chloride 101 - 111 mmol/L - - 105  CO2 22 - 29 mEq/L 25 26 26   Calcium 8.4 - 10.4 mg/dL 8.9 8.9 8.9  Total Protein 6.4 - 8.3 g/dL 5.9(L) 5.7(L) -  Total Bilirubin 0.20 - 1.20 mg/dL 0.57 0.62 -  Alkaline Phos 40 - 150 U/L 71 74 -  AST 5 - 34 U/L 19 15 -  ALT 0 - 55 U/L 11 16 -    RADIOGRAPHIC STUDIES: No results found.   ASSESSMENT: [name] [name] 83 y.o. male with a history of Polycythemia vera ([place])   PLAN:  1. Polycythemia vera diagnosed in 1997, JAK2 mutation positive.  -We reviewed the nature history of PV, most people do very well, some people would develop myelofibrosis or leukemia late on. The major complication is thrombosis. -He  has been on Hydrea 500 mg daily for 4-5 years, due to the worsening thrombocytopenia, I decreased to 500 mg 4 days a week (M, W, F, Sat and Sun) -His thrombocytopenia has improved, but his WBC has increased to 20K's since we decreased his Hydrea. H/H also slightly increased. So I increased his Hydrea to 500 mg daily except Monday and Friday. -Lab reviewed, hematocrit 46.7% (09/14/16), he received a phlebotomy . -he will continue Hydrea 500 mg daily except Monday and Friday  -We discussed at length that Hydrea will slow down wound healing. If his left lower extremity blister becomes a wound, and does not he well, I may consider holding Hydrea for short period time. -He is on Xarelto now. -He has been having phlebotomy every 2-4 months, he is able to tolerate them well. We increased Hydrea to 6 days a week to lower the need of phlebotomies in 01/2017. -Lab reviewed, improved white blood count, stable thrombocytopenia at 98K, HCT at 44.6%. He does not need a phlebotomy today. Will repeat labs in 6 weeks to see if he needs a  phlebotomy. Continue hydrea 6 days/week  -F/u in 3 months    2 RLE DVT, LLE DVT in 02/2015, likely secondary to #1 -I recommend him to continue Xarelto indefinitely, due to recurrent DVT. -He knows to be careful about fall and injury -He had vascular surgery on his left leg on 09/04/16 for poor circulation. Laser ablation of the left great saphenous vein from the proximal calf to near the saphenofemoral junction performed under local tumescent anesthesia by Dr. Kellie Simmering.  3. Left low abdomen and groin pain from hernia  -Pt has pain and limps with walk, exam showed LUQ abdominal tenderness, no lymphadenopathy in the groin area -Has seen PCP and orthopedic surgeon, received injection for pain but did not resolve.  -he had CT scan on 03/07/17 and was found to have a left inguinal hernia. He was seen by surgeon, he will hold on surgery for now.  5. Heart murmur, SOB and Fatigue -pt  complains slightly worsening fatigue and dyspnea on exertion since his acute gastritis in 05/2017  -he has mild AS on echo in 2011, exam today showed significant systolic murmur at aortic area, suspect from AS  -I will send note to PCP, Dr. Dagmar Hait, I suggest him to f/u with Dr. Dagmar Hait and see if he needs repeat ECHO (I offered today, he wants to wait)  -His oxygen level remains to be normal with 6 mins walking, suspicion for PE is low, and he is already on Xarelto     PLAN:  -Send note to PCP, Dr. Dagmar Hait, I encourage pt to see Dr. Dagmar Hait for his dyspena and fatigue  -Lab and phlebotomy in 6 and 12 weeks, phlebotomy if HCT>45%. -continue hydrea  -F/u in 12 weeks    All questions were answered. The patient knows to call the clinic with any problems, questions or concerns. We can certainly see the patient much sooner if necessary.  I spent 20 minutes counseling the patient face to face. The total time spent in the appointment was 25 minutes.    Truitt Merle, MD 06/28/2017     This document serves as a record of services personally performed by Truitt Merle, MD. It was created on her behalf by Joslyn Devon, a trained medical scribe. The creation of this record is based on the scribe's personal observations and the provider's statements to them.    I have reviewed the above documentation for accuracy and completeness, and I agree with the above.

## 2017-06-28 ENCOUNTER — Other Ambulatory Visit (HOSPITAL_BASED_OUTPATIENT_CLINIC_OR_DEPARTMENT_OTHER): Payer: Medicare Other

## 2017-06-28 ENCOUNTER — Telehealth: Payer: Self-pay | Admitting: Hematology

## 2017-06-28 ENCOUNTER — Encounter: Payer: Self-pay | Admitting: Hematology

## 2017-06-28 ENCOUNTER — Ambulatory Visit (HOSPITAL_BASED_OUTPATIENT_CLINIC_OR_DEPARTMENT_OTHER): Payer: Medicare Other | Admitting: Hematology

## 2017-06-28 ENCOUNTER — Other Ambulatory Visit: Payer: Medicare Other

## 2017-06-28 VITALS — BP 160/71 | HR 70 | Temp 98.7°F | Resp 20 | Ht 71.0 in | Wt 176.4 lb

## 2017-06-28 DIAGNOSIS — R1032 Left lower quadrant pain: Secondary | ICD-10-CM | POA: Diagnosis not present

## 2017-06-28 DIAGNOSIS — R011 Cardiac murmur, unspecified: Secondary | ICD-10-CM | POA: Diagnosis not present

## 2017-06-28 DIAGNOSIS — R0609 Other forms of dyspnea: Secondary | ICD-10-CM

## 2017-06-28 DIAGNOSIS — R1030 Lower abdominal pain, unspecified: Secondary | ICD-10-CM

## 2017-06-28 DIAGNOSIS — D45 Polycythemia vera: Secondary | ICD-10-CM | POA: Diagnosis not present

## 2017-06-28 DIAGNOSIS — I82402 Acute embolism and thrombosis of unspecified deep veins of left lower extremity: Secondary | ICD-10-CM | POA: Diagnosis not present

## 2017-06-28 DIAGNOSIS — R5383 Other fatigue: Secondary | ICD-10-CM | POA: Diagnosis not present

## 2017-06-28 DIAGNOSIS — I82401 Acute embolism and thrombosis of unspecified deep veins of right lower extremity: Secondary | ICD-10-CM | POA: Diagnosis not present

## 2017-06-28 LAB — CBC WITH DIFFERENTIAL/PLATELET
BASO%: 1.5 % (ref 0.0–2.0)
BASOS ABS: 0.2 10*3/uL — AB (ref 0.0–0.1)
EOS ABS: 0.2 10*3/uL (ref 0.0–0.5)
EOS%: 1.3 % (ref 0.0–7.0)
HEMATOCRIT: 44.6 % (ref 38.4–49.9)
HEMOGLOBIN: 13.7 g/dL (ref 13.0–17.1)
LYMPH%: 10.1 % — ABNORMAL LOW (ref 14.0–49.0)
MCH: 30.6 pg (ref 27.2–33.4)
MCHC: 30.7 g/dL — ABNORMAL LOW (ref 32.0–36.0)
MCV: 99.8 fL — AB (ref 79.3–98.0)
MONO#: 0.6 10*3/uL (ref 0.1–0.9)
MONO%: 4.1 % (ref 0.0–14.0)
NEUT%: 83 % — ABNORMAL HIGH (ref 39.0–75.0)
NEUTROS ABS: 12.4 10*3/uL — AB (ref 1.5–6.5)
NRBC: 0 % (ref 0–0)
PLATELETS: 98 10*3/uL — AB (ref 140–400)
RBC: 4.47 10*6/uL (ref 4.20–5.82)
RDW: 15.1 % — AB (ref 11.0–14.6)
WBC: 15 10*3/uL — AB (ref 4.0–10.3)
lymph#: 1.5 10*3/uL (ref 0.9–3.3)

## 2017-06-28 NOTE — Telephone Encounter (Signed)
Gave avs and calendar for January and March 2019 °

## 2017-07-05 ENCOUNTER — Ambulatory Visit: Payer: Medicare Other | Admitting: Hematology

## 2017-08-06 ENCOUNTER — Encounter (INDEPENDENT_AMBULATORY_CARE_PROVIDER_SITE_OTHER): Payer: Self-pay | Admitting: Orthopaedic Surgery

## 2017-08-06 ENCOUNTER — Ambulatory Visit (INDEPENDENT_AMBULATORY_CARE_PROVIDER_SITE_OTHER): Payer: Medicare Other | Admitting: Physician Assistant

## 2017-08-06 DIAGNOSIS — M1612 Unilateral primary osteoarthritis, left hip: Secondary | ICD-10-CM

## 2017-08-06 NOTE — Progress Notes (Signed)
[name] [name] returns today due to his left hip pain.  He has severe osteoarthritis of the left hip.  He is caring for his wife does not feel he has time for a hip replacement.  Last injection was given over 6 months ago in the left hip by Dr. Ernestina Patches is done well and until the last 2 months.  He would like another injection in the hip.  He has had no new injury to the hip.  Pain is in the groin region does not radiate down the hip.  Physical exam: General well-developed well-nourished male in no acute distress mood and affect appropriate.   Left hip: he has decreased range of motion and pain with attempts of internal and external rotation.  Impression: Left hip osteoarthritis  Plan: We will set him up for a left hip intra-articular injection with Dr. Ernestina Patches.  He will follow-up with [place] on an as-needed basis.  Questions encouraged and answered at length.  He understands he can only have injections every 6 months at the earliest.

## 2017-08-09 ENCOUNTER — Inpatient Hospital Stay: Payer: Medicare Other | Attending: Hematology

## 2017-08-09 ENCOUNTER — Ambulatory Visit (INDEPENDENT_AMBULATORY_CARE_PROVIDER_SITE_OTHER): Payer: Medicare Other | Admitting: Physical Medicine and Rehabilitation

## 2017-08-09 ENCOUNTER — Inpatient Hospital Stay: Payer: Medicare Other

## 2017-08-09 DIAGNOSIS — D45 Polycythemia vera: Secondary | ICD-10-CM | POA: Diagnosis not present

## 2017-08-09 LAB — CBC WITH DIFFERENTIAL/PLATELET
Basophils Absolute: 0.4 10*3/uL — ABNORMAL HIGH (ref 0.0–0.1)
Basophils Relative: 2 %
EOS PCT: 1 %
Eosinophils Absolute: 0.2 10*3/uL (ref 0.0–0.5)
HCT: 44.7 % (ref 38.4–49.9)
Hemoglobin: 13.5 g/dL (ref 13.0–17.1)
LYMPHS ABS: 1.4 10*3/uL (ref 0.9–3.3)
LYMPHS PCT: 8 %
MCH: 30.5 pg (ref 27.2–33.4)
MCHC: 30.2 g/dL — AB (ref 32.0–36.0)
MCV: 100.9 fL — AB (ref 79.3–98.0)
MONOS PCT: 5 %
Monocytes Absolute: 0.9 10*3/uL (ref 0.1–0.9)
Neutro Abs: 15.2 10*3/uL — ABNORMAL HIGH (ref 1.5–6.5)
Neutrophils Relative %: 84 %
PLATELETS: 112 10*3/uL — AB (ref 140–400)
RBC: 4.43 MIL/uL (ref 4.20–5.82)
RDW: 15.2 % (ref 11.0–15.6)
WBC: 18 10*3/uL — ABNORMAL HIGH (ref 4.0–10.3)

## 2017-08-09 LAB — COMPREHENSIVE METABOLIC PANEL
ALBUMIN: 3.7 g/dL (ref 3.5–5.0)
ALT: 15 U/L (ref 0–55)
AST: 21 U/L (ref 5–34)
Alkaline Phosphatase: 73 U/L (ref 40–150)
Anion gap: 8 (ref 3–11)
BUN: 34 mg/dL — ABNORMAL HIGH (ref 7–26)
CHLORIDE: 106 mmol/L (ref 98–109)
CO2: 26 mmol/L (ref 22–29)
CREATININE: 1.29 mg/dL (ref 0.70–1.30)
Calcium: 8.7 mg/dL (ref 8.4–10.4)
GFR calc Af Amer: 54 mL/min — ABNORMAL LOW (ref >60)
GFR calc non Af Amer: 47 mL/min — ABNORMAL LOW (ref >60)
Glucose, Bld: 96 mg/dL (ref 70–140)
POTASSIUM: 4.7 mmol/L (ref 3.5–5.1)
SODIUM: 140 mmol/L (ref 136–145)
Total Bilirubin: 0.5 mg/dL (ref 0.2–1.2)
Total Protein: 5.9 g/dL — ABNORMAL LOW (ref 6.4–8.3)

## 2017-08-09 NOTE — Progress Notes (Signed)
phlebotomy not required, HCT 44.7. Copy of lab given in lobby.

## 2017-08-23 ENCOUNTER — Encounter (INDEPENDENT_AMBULATORY_CARE_PROVIDER_SITE_OTHER): Payer: Self-pay | Admitting: Physical Medicine and Rehabilitation

## 2017-08-23 ENCOUNTER — Ambulatory Visit (INDEPENDENT_AMBULATORY_CARE_PROVIDER_SITE_OTHER): Payer: Medicare Other | Admitting: Physical Medicine and Rehabilitation

## 2017-08-23 ENCOUNTER — Ambulatory Visit (INDEPENDENT_AMBULATORY_CARE_PROVIDER_SITE_OTHER): Payer: Medicare Other

## 2017-08-23 DIAGNOSIS — M25552 Pain in left hip: Secondary | ICD-10-CM

## 2017-08-23 MED ORDER — BUPIVACAINE HCL 0.5 % IJ SOLN
3.0000 mL | INTRAMUSCULAR | Status: AC | PRN
  Administered 2017-08-23: 3 mL via INTRA_ARTICULAR

## 2017-08-23 MED ORDER — TRIAMCINOLONE ACETONIDE 40 MG/ML IJ SUSP
80.0000 mg | INTRAMUSCULAR | Status: AC | PRN
  Administered 2017-08-23: 80 mg via INTRA_ARTICULAR

## 2017-08-23 NOTE — Patient Instructions (Signed)

## 2017-08-23 NOTE — Progress Notes (Deleted)
Pt states a dull constant pain. Pt states pain started back two months ago. Pt states last injection 01/29/17 lasted 4 months and it helped out a lot. Pt states pain is worse when he is sitting and walking, after laying down for about 30 minutes pain eases up. -Dye Allergies.

## 2017-08-23 NOTE — Progress Notes (Signed)
[name] [name] - 84 y.o. male MRN [id number]  Date of birth: 15-Jul-1924  Office Visit Note: Visit Date: 08/23/2017 PCP: [name] Solian, MD Referred by: [name] Solian, MD  Subjective: No chief complaint on file.  HPI: [name] [name] is a 84 year old gentleman with we saw him back in July of last year completed diagnostic and therapeutic injection.  He said he did really well for about 3 months and that his pain slowly has increased over that time.  He does really find it limiting what he can do with standing and walking and getting up and down.  He does get pain in the hip and groin.  He said no new traumas.  He does follow with Benita Stabile, P.A.-C.    ROS Otherwise per HPI.  Assessment & Plan: Visit Diagnoses:  1. Pain in left hip     Plan: Findings:  Diagnostic and hopefully therapeutic left hip arthrogram.  Patient did get great relief during the anesthetic phase of the injection.  We did 5 mL of serous slightly yellow tinged synovial joint fluid.    Meds & Orders: No orders of the defined types were placed in this encounter.   Orders Placed This Encounter  Procedures  . Large Joint Inj: L hip joint  . XR C-ARM NO REPORT    Follow-up: Return if symptoms worsen or fail to improve.   Procedures: Large Joint Inj: L hip joint on 08/23/2017 11:15 AM Indications: pain and diagnostic evaluation Details: 22 G needle, anterior approach  Arthrogram: Yes  Medications: 3 mL bupivacaine 0.5 %; 80 mg triamcinolone acetonide 40 MG/ML Aspirate: 5 mL yellow and serous Outcome: tolerated well, no immediate complications  Arthrogram demonstrated excellent flow of contrast throughout the joint surface without extravasation or obvious defect.  The patient had relief of symptoms during the anesthetic phase of the injection.  Procedure, treatment alternatives, risks and benefits explained, specific risks discussed. Consent was given by the patient. Immediately prior to procedure a time out  was called to verify the correct patient, procedure, equipment, support staff and site/side marked as required. Patient was prepped and draped in the usual sterile fashion.      No notes on file   Clinical History: No specialty comments available.  He reports that  has never smoked. he has never used smokeless tobacco. No results for input(s): HGBA1C, LABURIC in the last 8760 hours.  Objective:  VS:  HT:    WT:   BMI:     BP:   HR: bpm  TEMP: ( )  RESP:  Physical Exam  Musculoskeletal:  Painful limited range of motion of the left hip    Ortho Exam Imaging: Xr C-arm No Report  Result Date: 08/23/2017 Please see Notes or Procedures tab for imaging impression.   Past Medical/Family/Surgical/Social History: Medications & Allergies reviewed per EMR Patient Active Problem List   Diagnosis Date Noted  . Unilateral primary osteoarthritis, left hip 01/08/2017  . Pain in left hip 01/08/2017  . Varicose veins of bilateral lower extremities with other complications 01/60/1093  . Thrombocytopenia ([place]) 11/18/2015  . DVT (deep venous thrombosis) ([place]) 09/26/2013  . Aneurysm of unspecified site ([place]) 02/26/2012  . Polycythemia vera ([place]) 05/30/2011   Past Medical History:  Diagnosis Date  . Bone marrow disease   . BPH (benign prostatic hypertrophy)   . Depression   . DJD (degenerative joint disease)   . Dyslipidemia   . Gout   . HTN (hypertension)   . Mild aortic  stenosis   . Polycythemia vera(238.4)    Family History  Problem Relation Age of Onset  . Hypertension Mother    Past Surgical History:  Procedure Laterality Date  . ENDOVENOUS ABLATION SAPHENOUS VEIN W/ LASER Left 09/04/2016   endovenous laser ablation left greater saphenous vein by Tinnie Gens MD  . HEMORROIDECTOMY     Social History   Occupational History  . Not on file  Tobacco Use  . Smoking status: Never Smoker  . Smokeless tobacco: Never Used  Substance and Sexual Activity  . Alcohol use: Yes     Alcohol/week: 4.2 oz    Types: 7 Glasses of wine per week    Comment: red wine  . Drug use: No  . Sexual activity: Not on file

## 2017-09-19 ENCOUNTER — Telehealth: Payer: Self-pay

## 2017-09-19 NOTE — Telephone Encounter (Signed)
Patient called to verify appointment on 3/7 at 12:30. Per 3/6 los

## 2017-09-20 ENCOUNTER — Inpatient Hospital Stay: Payer: Medicare Other

## 2017-09-20 ENCOUNTER — Encounter: Payer: Self-pay | Admitting: Nurse Practitioner

## 2017-09-20 ENCOUNTER — Inpatient Hospital Stay: Payer: Medicare Other | Attending: Hematology | Admitting: Nurse Practitioner

## 2017-09-20 ENCOUNTER — Telehealth: Payer: Self-pay | Admitting: Nurse Practitioner

## 2017-09-20 DIAGNOSIS — D45 Polycythemia vera: Secondary | ICD-10-CM

## 2017-09-20 DIAGNOSIS — Z86718 Personal history of other venous thrombosis and embolism: Secondary | ICD-10-CM | POA: Diagnosis not present

## 2017-09-20 LAB — CBC WITH DIFFERENTIAL/PLATELET
BASOS ABS: 0.2 10*3/uL — AB (ref 0.0–0.1)
BASOS PCT: 1 %
Eosinophils Absolute: 0.2 10*3/uL (ref 0.0–0.5)
Eosinophils Relative: 1 %
HCT: 47 % (ref 38.4–49.9)
HEMOGLOBIN: 14.4 g/dL (ref 13.0–17.1)
LYMPHS PCT: 7 %
Lymphs Abs: 1.3 10*3/uL (ref 0.9–3.3)
MCH: 31.3 pg (ref 27.2–33.4)
MCHC: 30.6 g/dL — ABNORMAL LOW (ref 32.0–36.0)
MCV: 102.2 fL — AB (ref 79.3–98.0)
Monocytes Absolute: 0.7 10*3/uL (ref 0.1–0.9)
Monocytes Relative: 4 %
NEUTROS ABS: 16.8 10*3/uL — AB (ref 1.5–6.5)
NEUTROS PCT: 87 %
Platelets: 174 10*3/uL (ref 140–400)
RBC: 4.6 MIL/uL (ref 4.20–5.82)
RDW: 15.5 % — AB (ref 11.0–14.6)
WBC: 19.2 10*3/uL — AB (ref 4.0–10.3)

## 2017-09-20 NOTE — Progress Notes (Signed)
[facility]  [phone] [phone]  Clinic Follow up Note   Patient Care Team: [name], MD as PCP - General (Internal Medicine) 09/20/2017  DIAGNOSIS: Polycythemia vera ([place])   CURRENT TREATMENT:   1. Phlebotomy as needed if HCT>45%.  2. Hydrea decreased to 500mg  daily 4-5 days/week since 02/2015; increased to take hydrea 6 days/week starting 02/08/17 3. Xarelto 20 mg started about mid-February 2015 and off after 6 month, restarted again in 02/2015 due to left LE DVT   INTERVAL HISTORY: [name] [name] returns for follow-up and phlebotomy as scheduled.  Last phlebotomy performed on 06/06/2017. Tolerates it well without problems.  Continues Hydrea 500 mg 6 days/week.  Feels well, denies changes in his health overall since last f/u.  Received a cortisone shot per ortho and left hip pain is much improved.  Fatigue and dyspnea have improved somewhat lately but still present, he remains fully functional, just has to "pace myself."  Ambulates independently around this facility.  He is due for routine PCP follow-up soon; he decided not to have cardiology follow-up since fatigue and dyspnea improved.  Denies fever or chills or recent illness, no GI symptoms, no bleeding, no cough or chest pain.  He has no specific concerns today.   REVIEW OF SYSTEMS:   Constitutional: Denies fevers, chills or abnormal weight loss (+) mild fatigue, stable  Eyes: Denies blurriness of vision Ears, nose, mouth, throat, and face: Denies mucositis or sore throat Respiratory: Denies cough or wheezes (+) mild DOE, stable Cardiovascular: Denies palpitation, chest discomfort (+) lower extremity swelling (+) poor lower extremities circulation Gastrointestinal:  Denies nausea, vomiting, constipation, diarrhea, bleeding, heartburn or change in bowel habits GU: Denies dysuria Skin: Denies abnormal skin rashes (+) dry Lymphatics: Denies new lymphadenopathy or easy  bruising Neurological:Denies numbness, tingling or new weaknesses Behavioral/Psych: Mood is stable, no new changes  MSK: (+) hip pain, much improved with steroid inj  All other systems were reviewed with the patient and are negative.  MEDICAL HISTORY:  Past Medical History:  Diagnosis Date  . Bone marrow disease   . BPH (benign prostatic hypertrophy)   . Depression   . DJD (degenerative joint disease)   . Dyslipidemia   . Gout   . HTN (hypertension)   . Mild aortic stenosis   . Polycythemia vera(238.4)     SURGICAL HISTORY: Past Surgical History:  Procedure Laterality Date  . ENDOVENOUS ABLATION SAPHENOUS VEIN W/ LASER Left 09/04/2016   endovenous laser ablation left greater saphenous vein by Tinnie Gens MD  . HEMORROIDECTOMY      I have reviewed the social history and family history with the patient and they are unchanged from previous note.  ALLERGIES:  is allergic to amoxicillin and colchicine.  MEDICATIONS:  Current Outpatient Medications  Medication Sig Dispense Refill  . allopurinol (ZYLOPRIM) 100 MG tablet Take 100 mg by mouth 2 (two) times daily.     Marland Kitchen escitalopram (LEXAPRO) 10 MG tablet Take 5 mg by mouth at bedtime.     Marland Kitchen glucosamine-chondroitin 500-400 MG tablet Take 1 tablet by mouth daily.    . hydroxyurea (HYDREA) 500 MG capsule Take 500 mg by mouth See admin instructions. Pt takes once daily in the morning on Sunday,  Tuesday, Wednesday, Thursday,  Friday , and Saturday.    Marland Kitchen lisinopril (PRINIVIL,ZESTRIL) 10 MG tablet Take 10 mg by mouth daily.    . Multiple Vitamin (MULTIVITAMIN WITH MINERALS) TABS tablet Take 1 tablet by mouth daily.    Marland Kitchen  omega-3 acid ethyl esters (LOVAZA) 1 g capsule Take 1 g by mouth daily.    . rivaroxaban (XARELTO) 20 MG TABS tablet Take 20 mg by mouth daily with supper.    . terazosin (HYTRIN) 5 MG capsule Take 5 mg by mouth at bedtime.    . vitamin E (VITAMIN E) 400 UNIT capsule Take 400 Units by mouth daily.    Marland Kitchen ALPRAZolam  (XANAX) 0.5 MG tablet Take 0.5 mg by mouth daily as needed for anxiety.     . traMADol (ULTRAM) 50 MG tablet Take 50 mg by mouth every 8 (eight) hours as needed for moderate pain.      No current facility-administered medications for this visit.     PHYSICAL EXAMINATION: ECOG PERFORMANCE STATUS: 1 - Symptomatic but completely ambulatory BP 123/64 P 76 RR 20 T 98 O2 SAT 96 WT 179.2 lbs  GENERAL:alert, no distress and comfortable SKIN: skin color, texture, turgor are normal, no rashes or significant lesions  EYES: normal, Conjunctiva are pink and non-injected, sclera clear OROPHARYNX:no exudate, no erythema and lips, buccal mucosa, and tongue normal  LYMPH:  no palpable cervical, supraclavicular, or axillary lymphadenopathy LUNGS: clear to auscultation bilaterally with normal breathing effort HEART: regular rate & rhythm (+) bilateral lower extremity edema with stasis changes (+) aortic murmur ABDOMEN:abdomen soft, non-tender and normal bowel sounds Musculoskeletal:no cyanosis of digits and no clubbing  NEURO: alert & oriented x 3 with fluent speech  LABORATORY DATA:  I have reviewed the data as listed CBC Latest Ref Rng & Units 09/20/2017 08/09/2017 06/28/2017  WBC 4.0 - 10.3 K/uL 19.2(H) 18.0(H) 15.0(H)  Hemoglobin 13.0 - 17.1 g/dL 14.4 13.5 13.7  Hematocrit 38.4 - 49.9 % 47.0 44.7 44.6  Platelets 140 - 400 K/uL 174 112(L) 98(L)     CMP Latest Ref Rng & Units 08/09/2017 05/10/2017 02/08/2017  Glucose 70 - 140 mg/dL 96 129 104  BUN 7 - 26 mg/dL 34(H) 26.6(H) 28.1(H)  Creatinine 0.70 - 1.30 mg/dL 1.29 1.3 1.2  Sodium 136 - 145 mmol/L 140 137 137  Potassium 3.5 - 5.1 mmol/L 4.7 4.8 4.7  Chloride 98 - 109 mmol/L 106 - -  CO2 22 - 29 mmol/L 26 25 26   Calcium 8.4 - 10.4 mg/dL 8.7 8.9 8.9  Total Protein 6.4 - 8.3 g/dL 5.9(L) 5.9(L) 5.7(L)  Total Bilirubin 0.2 - 1.2 mg/dL 0.5 0.57 0.62  Alkaline Phos 40 - 150 U/L 73 71 74  AST 5 - 34 U/L 21 19 15   ALT 0 - 55 U/L 15 11 16      RADIOGRAPHIC STUDIES: I have personally reviewed the radiological images as listed and agreed with the findings in the report. No results found.   ASSESSMENT & PLAN: 84 y.o. male with a history of Polycythemia vera ([place])   PLAN:  1. Polycythemia vera diagnosed in 1997, JAK2 mutation positive.  2. Bilateral DVT 02/2015; likely secondary to PV 3. Left low abdomen and groin pain, secondary to hernia 4. Heart murmur, SOB, fatigue  5. L hip pain, improved after steroid injection 04/2017 per Ortho  [name] [name] appears stable. He tolerates periodic phlebotomy well overall. Last performed 05/2017. Labs reviewed, HCT 47.0 today. He will undergo therapeutic phlebotomy. Platelets are normal, 174K, has previously been thrombocytopenic. However, his WBC is trending up to 19.0. No recent febrile illness. Reviewed with Dr. Burr Medico. Given that he feels well, will keep Hydrea dose at current 500 mg 6 days per week (holds on Mondays) for now. Will reassess at  next lab draw. He continues xarelto for h/o bilateral DVT, no bleeding. He has chosen not to pursue cardiology f/u for murmur, SOB, and dyspnea; he feels his symptoms are better overall and will f/u with his PCP routinely. Return in 6 weeks for lab and phlebotomy if HCT >45%; f/u in 12 weeks.   PLAN -Labs reviewed, HCT 47; perform therapeutic phlebotomy -Lab and phlebotomy in 6 and 12 weeks if HCT >45% -F/u with Dr. Burr Medico in 12 weeks -Continue routine PCP f/u  All questions were answered. The patient knows to call the clinic with any problems, questions or concerns. No barriers to learning was detected.     Alla Feeling, NP 09/20/17

## 2017-09-20 NOTE — Patient Instructions (Signed)

## 2017-09-20 NOTE — Telephone Encounter (Signed)
Scheduled appt pe r3/7 los - patient to get an updated schedule in the tx area.

## 2017-09-20 NOTE — Progress Notes (Signed)
[name] [name] presents today for phlebotomy per MD orders. Phlebotomy procedure started at 1408 with a 16G in right AC and ended at 1427. 510 grams removed. Patient observed for 30 minutes after procedure without any incident. Patient tolerated procedure well. IV needle removed intact.

## 2017-11-01 ENCOUNTER — Inpatient Hospital Stay: Payer: Medicare Other | Attending: Hematology

## 2017-11-01 ENCOUNTER — Inpatient Hospital Stay: Payer: Medicare Other

## 2017-11-01 DIAGNOSIS — D45 Polycythemia vera: Secondary | ICD-10-CM | POA: Insufficient documentation

## 2017-11-01 LAB — COMPREHENSIVE METABOLIC PANEL
ALK PHOS: 79 U/L (ref 40–150)
ALT: 13 U/L (ref 0–55)
AST: 18 U/L (ref 5–34)
Albumin: 3.7 g/dL (ref 3.5–5.0)
Anion gap: 5 (ref 3–11)
BILIRUBIN TOTAL: 0.5 mg/dL (ref 0.2–1.2)
BUN: 31 mg/dL — AB (ref 7–26)
CALCIUM: 9 mg/dL (ref 8.4–10.4)
CO2: 30 mmol/L — ABNORMAL HIGH (ref 22–29)
Chloride: 104 mmol/L (ref 98–109)
Creatinine, Ser: 1.54 mg/dL — ABNORMAL HIGH (ref 0.70–1.30)
GFR calc Af Amer: 44 mL/min — ABNORMAL LOW (ref >60)
GFR calc non Af Amer: 38 mL/min — ABNORMAL LOW (ref >60)
Glucose, Bld: 78 mg/dL (ref 70–140)
POTASSIUM: 4.8 mmol/L (ref 3.5–5.1)
Sodium: 139 mmol/L (ref 136–145)
TOTAL PROTEIN: 6.2 g/dL — AB (ref 6.4–8.3)

## 2017-11-01 LAB — CBC WITH DIFFERENTIAL/PLATELET
Basophils Absolute: 0.3 10*3/uL — ABNORMAL HIGH (ref 0.0–0.1)
Basophils Relative: 2 %
Eosinophils Absolute: 0.2 10*3/uL (ref 0.0–0.5)
Eosinophils Relative: 1 %
HEMATOCRIT: 49.5 % (ref 38.4–49.9)
Hemoglobin: 15.1 g/dL (ref 13.0–17.1)
LYMPHS PCT: 8 %
Lymphs Abs: 1.4 10*3/uL (ref 0.9–3.3)
MCH: 31.4 pg (ref 27.2–33.4)
MCHC: 30.5 g/dL — ABNORMAL LOW (ref 32.0–36.0)
MCV: 102.9 fL — ABNORMAL HIGH (ref 79.3–98.0)
MONOS PCT: 5 %
Monocytes Absolute: 1 10*3/uL — ABNORMAL HIGH (ref 0.1–0.9)
NEUTROS ABS: 16 10*3/uL — AB (ref 1.5–6.5)
Neutrophils Relative %: 84 %
Platelets: 96 10*3/uL — ABNORMAL LOW (ref 140–400)
RBC: 4.81 MIL/uL (ref 4.20–5.82)
RDW: 15.2 % — AB (ref 11.0–14.6)
WBC: 18.9 10*3/uL — ABNORMAL HIGH (ref 4.0–10.3)

## 2017-11-01 NOTE — Progress Notes (Signed)
421 units removed from right AC during phlebotomy procedure.  Patient tolerated well.  Site clotted.  Patient preferred not to have a second venipuncture for remaining units.  Beverage and snack provided.  Patient observed during 30 min post observation.

## 2017-11-01 NOTE — Patient Instructions (Signed)
Therapeutic Phlebotomy Discharge Instructions  - Increase your fluid intake over the next 4 hours  - No smoking for 30 minutes  - Avoid using the affected arm (the one you had the blood drawn from) for heavy lifting or other activities.  - You may resume all normal activities after 30 minutes.  You are to notify the office if you experience:   - Persistent dizziness and/or lightheadedness -Uncontrolled or excessive bleeding at the site.   Therapeutic Phlebotomy, Care After Refer to this sheet in the next few weeks. These instructions provide you with information about caring for yourself after your procedure. Your health care provider may also give you more specific instructions. Your treatment has been planned according to current medical practices, but problems sometimes occur. Call your health care provider if you have any problems or questions after your procedure. What can I expect after the procedure? After the procedure, it is common to have:  Light-headedness or dizziness. You may feel faint.  Nausea.  Tiredness.  Follow these instructions at home: Activity  Return to your normal activities as directed by your health care provider. Most people can go back to their normal activities right away.  Avoid strenuous physical activity and heavy lifting or pulling for about 5 hours after the procedure. Do not lift anything that is heavier than 10 lb (4.5 kg).  Athletes should avoid strenuous exercise for at least 12 hours.  Change positions slowly for the remainder of the day. This will help to prevent light-headedness or fainting.  If you feel light-headed, lie down until the feeling goes away. Eating and drinking  Be sure to eat well-balanced meals for the next 24 hours.  Drink enough fluid to keep your urine clear or pale yellow.  Avoid drinking alcohol on the day that you had the procedure. Care of the Needle Insertion Site  Keep your bandage dry. You can remove the  bandage after about 5 hours or as directed by your health care provider.  If you have bleeding from the needle insertion site, elevate your arm and press firmly on the site until the bleeding stops.  If you have bruising at the site, apply ice to the area: ? Put ice in a plastic bag. ? Place a towel between your skin and the bag. ? Leave the ice on for 20 minutes, 2-3 times a day for the first 24 hours.  If the swelling does not go away after 24 hours, apply a warm, moist washcloth to the area for 20 minutes, 2-3 times a day. General instructions  Avoid smoking for at least 30 minutes after the procedure.  Keep all follow-up visits as directed by your health care provider. It is important to continue with further therapeutic phlebotomy treatments as directed. Contact a health care provider if:  You have redness, swelling, or pain at the needle insertion site.  You have fluid, blood, or pus coming from the needle insertion site.  You feel light-headed, dizzy, or nauseated, and the feeling does not go away.  You notice new bruising at the needle insertion site.  You feel weaker than normal.  You have a fever or chills. Get help right away if:  You have severe nausea or vomiting.  You have chest pain.  You have trouble breathing. This information is not intended to replace advice given to you by your health care provider. Make sure you discuss any questions you have with your health care provider. Document Released: 12/05/2010 Document Revised: 03/04/2016   Document Reviewed: 06/29/2014 Elsevier Interactive Patient Education  2018 Elsevier Inc.   

## 2017-11-02 ENCOUNTER — Telehealth: Payer: Self-pay | Admitting: Hematology

## 2017-11-02 NOTE — Telephone Encounter (Signed)
I called patient and reviewed his labs from yesterday.  He has noticed slightly worsening polycythemia, and required phlebotomy yesterday and a months ago.  He is compliant with Hydrea, 500 mg daily except Sundays.  His previous skin ulcers in the lower extremities have resolved completely.  I recommend him to increase Hydrea to 500 daily, 7 days a week.  He voiced good understanding, and agrees to proceed.  He does have mild thrombocytopenia, will monitor closely.  Truitt Merle  11/02/2017

## 2017-12-12 NOTE — Progress Notes (Signed)
[facility] OFFICE PROGRESS NOTE  [name], MD [place] [address]  Date of Service:  12/13/2017   DIAGNOSIS: Follow up for Polycythemia Vera   CURRENT TREATMENT:   1. Phlebotomy as needed if HCT>45%.  2. Hydrea decreased to 500mg  daily 4-5 days/week since 02/2015; increased to take hydrea 6 days/week starting 02/08/17 (hold on Mondays). Increase to Hydrea 500mg  BID on Mondays and 500mg  once daily for the rest of week starting 12/13/17.  3. Xarelto 20 mg started about mid-February 2015 and off after 6 month, restarted again in 02/2015 due to left LE DVT  INTERVAL HISTORY:   TSUGIO ELISON 84 y.o. male with a history of Polycythemia vera dating back to 1997, JAK2 mutation positive is here for follow up. He presents to the clinic today noting he is doing well overall. He note he would like to get his inguinal hernia repaired but his surgeon said given he is prone to blood clots he does not think he can handle that. Pt note he take 500mg  every day now. He notes he is overall tolerating his Hydrea.    On review of symptoms, pt denies chest discomfort, cough or abdominal issues. He is overall stable.     MEDICAL HISTORY: Past Medical History:  Diagnosis Date  . Bone marrow disease   . BPH (benign prostatic hypertrophy)   . Depression   . DJD (degenerative joint disease)   . Dyslipidemia   . Gout   . HTN (hypertension)   . Mild aortic stenosis   . Polycythemia vera(238.4)     INTERIM HISTORY: has Polycythemia vera ([place]); Aneurysm of unspecified site [facility]); DVT (deep venous thrombosis) ([place]); Thrombocytopenia ([place]); Varicose veins of bilateral lower extremities with other complications; Unilateral primary osteoarthritis, left hip; and Pain in left hip on their problem list.    ALLERGIES:  is allergic to amoxicillin and colchicine.  MEDICATIONS: has a current medication list which includes the following prescription(s): acetaminophen,  allopurinol, alprazolam, escitalopram, fish oil-omega-3 fatty acids, hydroxyurea, lisinopril, multivitamin, rivaroxaban, terazosin, tramadol, and vitamin e.  SURGICAL HISTORY:  Past Surgical History:  Procedure Laterality Date  . ENDOVENOUS ABLATION SAPHENOUS VEIN W/ LASER Left 09/04/2016   endovenous laser ablation left greater saphenous vein by Tinnie Gens MD  . HEMORROIDECTOMY      REVIEW OF SYSTEMS:   Constitutional: Denies fevers, chills or abnormal weight loss   Eyes: Denies blurriness of vision Ears, nose, mouth, throat, and face: Denies mucositis or sore throat Respiratory: (+) SOB upon walking/exertion  Cardiovascular: Denies palpitation, chest discomfort or lower extremity swelling (+) heart murmur Gastrointestinal:  Denies nausea, heartburn or change in bowel habits  Skin: Denies abnormal skin rashes  Lymphatics: Denies new lymphadenopathy or easy bruising Neurological:Denies numbness, tingling or new weaknesses MSK: (+) Hip pain  Behavioral/Psych: Mood is stable, no new changes  All other systems were reviewed with the patient and are negative.  PHYSICAL EXAMINATION:  ECOG PERFORMANCE STATUS: 1  Vitals:   12/13/17 1149  BP: 113/69  Pulse: 71  Resp: 17  Temp: 97.7 F (36.5 C)  TempSrc: Oral  SpO2: 95%  Weight: 174 lb 12.8 oz (79.3 kg)  Height: 5\' 11"  (1.803 m)    General: Easily ambulatory.  HEENT: Sclerae anicteric. Conjunctivae were pink. Pupils round and reactive bilaterally. Oral mucosa is moist without ulceration or thrush.  No occipital, submandibular, cervical, supraclavicular or axillar adenopathy.  Lungs: clear to auscultation without wheezes. No rales or rhonchi.  Heart: regular  rate. No gallop or rubs. (+) heart murmur  Abdomen: soft No guarding or rebound tenderness. No palpable hepatosplenomegaly. Extremities: No clubbing or cyanosis.No calf tenderness to palpitation, no peripheral edema. The patient had grossly intact strength in upper and lower  extremities.  Skin: Diffuse erythema and skin pigmentation in the left lower extremity above the ankle, no ulcers or blisters.   Neuro: non-focal, alert and oriented to time, person and place, appropriate affect   Labs:  CBC Latest Ref Rng & Units 12/13/2017 11/01/2017 09/20/2017  WBC 4.0 - 10.3 K/uL 15.9(H) 18.9(H) 19.2(H)  Hemoglobin 13.0 - 17.1 g/dL 15.8 15.1 14.4  Hematocrit 38.4 - 49.9 % 50.8(H) 49.5 47.0  Platelets 140 - 400 K/uL 108(L) 96(L) 174   CMP Latest Ref Rng & Units 11/01/2017 08/09/2017 05/10/2017  Glucose 70 - 140 mg/dL 78 96 129  BUN 7 - 26 mg/dL 31(H) 34(H) 26.6(H)  Creatinine 0.70 - 1.30 mg/dL 1.54(H) 1.29 1.3  Sodium 136 - 145 mmol/L 139 140 137  Potassium 3.5 - 5.1 mmol/L 4.8 4.7 4.8  Chloride 98 - 109 mmol/L 104 106 -  CO2 22 - 29 mmol/L 30(H) 26 25  Calcium 8.4 - 10.4 mg/dL 9.0 8.7 8.9  Total Protein 6.4 - 8.3 g/dL 6.2(L) 5.9(L) 5.9(L)  Total Bilirubin 0.2 - 1.2 mg/dL 0.5 0.5 0.57  Alkaline Phos 40 - 150 U/L 79 73 71  AST 5 - 34 U/L 18 21 19   ALT 0 - 55 U/L 13 15 11     RADIOGRAPHIC STUDIES: No results found.   ASSESSMENT: SCHNEIDER WARCHOL 84 y.o. male with a history of Polycythemia vera ([place])   1. Polycythemia vera diagnosed in 1997, JAK2 mutation positive.  -We reviewed the nature history of PV, most people do very well, some people would develop myelofibrosis or leukemia late on. The major complication is thrombosis. -He has been on Hydrea 500 mg daily for 4-5 years, due to the worsening thrombocytopenia, I decreased to 500 mg 4 days a week (M, W, F, Sat and Sun), and subsequently gradually increased to 500 daily due to elevated hematocrit -He has been requiring phlebotomy more frequently lately --Labs reviewed, Hct increased to 50.8. He will need phlebotomy today. He is agreeable.  -I discussed he can continue Hydrea or switch to interferon injections. I recommend start with increase in Hydrea to 500mg  BID on Mondays and once daily Tuesday-Sunday.  -Will  repeat labs in 2 weeks to monitor his blood counts and if not significantly thrombocytopenic, will continue dosage.  -Plan for phlebotomy monthly x2.  -F/u in 8 weeks    2 RLE DVT, LLE DVT in 02/2015, likely secondary to #1 -I recommend him to continue Xarelto indefinitely, due to recurrent DVT. -He knows to be careful about fall and injury -He had vascular surgery on his left leg on 09/04/16 for poor circulation. Laser ablation of the left great saphenous vein from the proximal calf to near the saphenofemoral junction performed under local tumescent anesthesia by Dr. Kellie Simmering.  3. Left low abdomen and groin pain from hernia  -Pt has pain and limps with walk, exam showed LUQ abdominal tenderness, no lymphadenopathy in the groin area -Has seen PCP and orthopedic surgeon, received injection for pain but did not resolve.  -he had CT scan on 03/07/17 and was found to have a left inguinal hernia. He was seen by surgeon, he will hold on surgery for now due risk for blood clots.    5. Heart murmur, SOB and Fatigue -pt  complains slightly worsening fatigue and dyspnea on exertion since his acute gastritis in 05/2017  -he has mild AS on echo in 2011, exam today showed significant systolic murmur at aortic area, suspect from AS  -I will send note to PCP, Dr. Dagmar Hait, I suggest him to f/u with Dr. Dagmar Hait and see if he needs repeat ECHO (I offered today, he wants to wait)  -fatigue improved some lately  PLAN:  -Labs reviewed, Hct at 50.8%, will proceed with phlebotomy today  -Increase Hydrea to 500 mg BID on Mondays and once daily Tuesday-Sunday.  -Lab in 2 weeks  -Lab and phlebotomy in 4 and 8 weeks  -F/u in 8 weeks   All questions were answered. The patient knows to call the clinic with any problems, questions or concerns. We can certainly see the patient much sooner if necessary.  I spent 20 minutes counseling the patient face to face. The total time spent in the appointment was 25 minutes.    Truitt Merle, MD 12/13/2017     I, Joslyn Devon, am acting as scribe for Truitt Merle, MD.   I have reviewed the above documentation for accuracy and completeness, and I agree with the above.

## 2017-12-13 ENCOUNTER — Inpatient Hospital Stay (HOSPITAL_BASED_OUTPATIENT_CLINIC_OR_DEPARTMENT_OTHER): Payer: Medicare Other | Admitting: Hematology

## 2017-12-13 ENCOUNTER — Inpatient Hospital Stay: Payer: Medicare Other | Attending: Hematology

## 2017-12-13 ENCOUNTER — Inpatient Hospital Stay: Payer: Medicare Other

## 2017-12-13 ENCOUNTER — Encounter: Payer: Self-pay | Admitting: Hematology

## 2017-12-13 VITALS — BP 113/69 | HR 71 | Temp 97.7°F | Resp 17 | Ht 71.0 in | Wt 174.8 lb

## 2017-12-13 VITALS — BP 98/68 | HR 78 | Temp 97.7°F | Resp 16

## 2017-12-13 DIAGNOSIS — K4090 Unilateral inguinal hernia, without obstruction or gangrene, not specified as recurrent: Secondary | ICD-10-CM | POA: Diagnosis not present

## 2017-12-13 DIAGNOSIS — Z7901 Long term (current) use of anticoagulants: Secondary | ICD-10-CM | POA: Insufficient documentation

## 2017-12-13 DIAGNOSIS — D45 Polycythemia vera: Secondary | ICD-10-CM

## 2017-12-13 DIAGNOSIS — Z86718 Personal history of other venous thrombosis and embolism: Secondary | ICD-10-CM | POA: Insufficient documentation

## 2017-12-13 LAB — CBC WITH DIFFERENTIAL/PLATELET
Basophils Absolute: 0.3 K/uL — ABNORMAL HIGH (ref 0.0–0.1)
Basophils Relative: 2 %
Eosinophils Absolute: 0.1 K/uL (ref 0.0–0.5)
Eosinophils Relative: 1 %
HCT: 50.8 % — ABNORMAL HIGH (ref 38.4–49.9)
Hemoglobin: 15.8 g/dL (ref 13.0–17.1)
Lymphocytes Relative: 9 %
Lymphs Abs: 1.4 K/uL (ref 0.9–3.3)
MCH: 32 pg (ref 27.2–33.4)
MCHC: 31.1 g/dL — ABNORMAL LOW (ref 32.0–36.0)
MCV: 103 fL — ABNORMAL HIGH (ref 79.3–98.0)
Monocytes Absolute: 1 K/uL — ABNORMAL HIGH (ref 0.1–0.9)
Monocytes Relative: 6 %
Neutro Abs: 13.1 K/uL — ABNORMAL HIGH (ref 1.5–6.5)
Neutrophils Relative %: 82 %
Platelets: 108 K/uL — ABNORMAL LOW (ref 140–400)
RBC: 4.93 MIL/uL (ref 4.20–5.82)
RDW: 16.4 % — ABNORMAL HIGH (ref 11.0–14.6)
WBC: 15.9 K/uL — ABNORMAL HIGH (ref 4.0–10.3)

## 2017-12-13 MED ORDER — HYDROXYUREA 500 MG PO CAPS: ORAL_CAPSULE | ORAL | 0 refills | Status: DC

## 2017-12-13 NOTE — Progress Notes (Signed)
Pt received phlebotomy treatment today per Dr.Feng parameters. Pt HCT this morning was 50.8. Pt to keep below HCT 45.   Started phlebotomy with 16g needle on RAC. Blood total output of 558cc in 10 minutes. Pt offered hydration and nutrition during 24min post observation. PT VSS.

## 2017-12-13 NOTE — Patient Instructions (Signed)

## 2017-12-14 ENCOUNTER — Telehealth: Payer: Self-pay | Admitting: Hematology

## 2017-12-14 NOTE — Telephone Encounter (Signed)
Scheduled appt per 5/31 los - gave patient calender.

## 2017-12-20 DIAGNOSIS — I10 Essential (primary) hypertension: Secondary | ICD-10-CM | POA: Diagnosis not present

## 2017-12-20 DIAGNOSIS — E7849 Other hyperlipidemia: Secondary | ICD-10-CM | POA: Diagnosis not present

## 2017-12-20 DIAGNOSIS — R7302 Impaired glucose tolerance (oral): Secondary | ICD-10-CM | POA: Diagnosis not present

## 2017-12-20 DIAGNOSIS — Z125 Encounter for screening for malignant neoplasm of prostate: Secondary | ICD-10-CM | POA: Diagnosis not present

## 2017-12-20 DIAGNOSIS — M109 Gout, unspecified: Secondary | ICD-10-CM | POA: Diagnosis not present

## 2017-12-20 DIAGNOSIS — R82998 Other abnormal findings in urine: Secondary | ICD-10-CM | POA: Diagnosis not present

## 2017-12-24 ENCOUNTER — Telehealth: Payer: Self-pay | Admitting: Nurse Practitioner

## 2017-12-24 NOTE — Telephone Encounter (Signed)
Patient called to reschedule  °

## 2017-12-26 ENCOUNTER — Inpatient Hospital Stay: Payer: Medicare Other | Attending: Hematology

## 2017-12-26 DIAGNOSIS — D45 Polycythemia vera: Secondary | ICD-10-CM | POA: Diagnosis not present

## 2017-12-26 LAB — CBC WITH DIFFERENTIAL/PLATELET
BASOS PCT: 2 %
Basophils Absolute: 0.2 10*3/uL — ABNORMAL HIGH (ref 0.0–0.1)
EOS ABS: 0.2 10*3/uL (ref 0.0–0.5)
Eosinophils Relative: 1 %
HCT: 46.7 % (ref 38.4–49.9)
Hemoglobin: 14.5 g/dL (ref 13.0–17.1)
LYMPHS ABS: 1.2 10*3/uL (ref 0.9–3.3)
Lymphocytes Relative: 8 %
MCH: 32.2 pg (ref 27.2–33.4)
MCHC: 31 g/dL — AB (ref 32.0–36.0)
MCV: 103.8 fL — ABNORMAL HIGH (ref 79.3–98.0)
MONO ABS: 0.9 10*3/uL (ref 0.1–0.9)
MONOS PCT: 6 %
Neutro Abs: 12.1 10*3/uL — ABNORMAL HIGH (ref 1.5–6.5)
Neutrophils Relative %: 83 %
PLATELETS: 81 10*3/uL — AB (ref 140–400)
RBC: 4.5 MIL/uL (ref 4.20–5.82)
RDW: 16.6 % — ABNORMAL HIGH (ref 11.0–14.6)
WBC: 14.6 10*3/uL — ABNORMAL HIGH (ref 4.0–10.3)

## 2017-12-27 ENCOUNTER — Telehealth: Payer: Self-pay

## 2017-12-27 ENCOUNTER — Other Ambulatory Visit: Payer: Medicare Other

## 2017-12-27 DIAGNOSIS — I10 Essential (primary) hypertension: Secondary | ICD-10-CM | POA: Diagnosis not present

## 2017-12-27 DIAGNOSIS — Z6825 Body mass index (BMI) 25.0-25.9, adult: Secondary | ICD-10-CM | POA: Diagnosis not present

## 2017-12-27 DIAGNOSIS — I350 Nonrheumatic aortic (valve) stenosis: Secondary | ICD-10-CM | POA: Diagnosis not present

## 2017-12-27 DIAGNOSIS — E7849 Other hyperlipidemia: Secondary | ICD-10-CM | POA: Diagnosis not present

## 2017-12-27 DIAGNOSIS — D45 Polycythemia vera: Secondary | ICD-10-CM | POA: Diagnosis not present

## 2017-12-27 DIAGNOSIS — M109 Gout, unspecified: Secondary | ICD-10-CM | POA: Diagnosis not present

## 2017-12-27 DIAGNOSIS — R7302 Impaired glucose tolerance (oral): Secondary | ICD-10-CM | POA: Diagnosis not present

## 2017-12-27 DIAGNOSIS — I720 Aneurysm of carotid artery: Secondary | ICD-10-CM | POA: Diagnosis not present

## 2017-12-27 DIAGNOSIS — Z0000 Encounter for general adult medical examination without abnormal findings: Secondary | ICD-10-CM | POA: Diagnosis not present

## 2017-12-27 DIAGNOSIS — F329 Major depressive disorder, single episode, unspecified: Secondary | ICD-10-CM | POA: Diagnosis not present

## 2017-12-27 DIAGNOSIS — Z1389 Encounter for screening for other disorder: Secondary | ICD-10-CM | POA: Diagnosis not present

## 2017-12-27 DIAGNOSIS — N183 Chronic kidney disease, stage 3 (moderate): Secondary | ICD-10-CM | POA: Diagnosis not present

## 2017-12-27 NOTE — Telephone Encounter (Signed)
Spoke with patient per Dr. Burr Medico informed platelets dropped slightly, continue current dose of hydrea and we will repeat his labs in 2 weeks as scheduled.  Caution patient to watch for bleeding.  Patient verbalized an understanding.

## 2017-12-27 NOTE — Telephone Encounter (Signed)
-----   Message from Truitt Merle, MD sent at 12/27/2017  8:58 AM EDT ----- Please let pt know his CBC result. His plt slightly dropped, OK to continue current dose hydrea, and repeat lab in 2 weeks as scheduled, watch for bleeding. Thanks  Truitt Merle  12/27/2017

## 2018-01-10 ENCOUNTER — Inpatient Hospital Stay: Payer: Medicare Other

## 2018-01-10 DIAGNOSIS — D45 Polycythemia vera: Secondary | ICD-10-CM | POA: Diagnosis not present

## 2018-01-10 LAB — CBC WITH DIFFERENTIAL/PLATELET
BASOS ABS: 0.2 10*3/uL — AB (ref 0.0–0.1)
Basophils Relative: 2 %
Eosinophils Absolute: 0.1 10*3/uL (ref 0.0–0.5)
Eosinophils Relative: 1 %
HEMATOCRIT: 48.1 % (ref 38.4–49.9)
Hemoglobin: 15 g/dL (ref 13.0–17.1)
LYMPHS PCT: 9 %
Lymphs Abs: 1.3 10*3/uL (ref 0.9–3.3)
MCH: 33 pg (ref 27.2–33.4)
MCHC: 31.2 g/dL — ABNORMAL LOW (ref 32.0–36.0)
MCV: 105.7 fL — AB (ref 79.3–98.0)
Monocytes Absolute: 0.7 10*3/uL (ref 0.1–0.9)
Monocytes Relative: 6 %
NEUTROS ABS: 11.1 10*3/uL — AB (ref 1.5–6.5)
NEUTROS PCT: 82 %
Platelets: 121 10*3/uL — ABNORMAL LOW (ref 140–400)
RBC: 4.55 MIL/uL (ref 4.20–5.82)
RDW: 16.6 % — ABNORMAL HIGH (ref 11.0–14.6)
WBC: 13.5 10*3/uL — AB (ref 4.0–10.3)

## 2018-01-10 NOTE — Patient Instructions (Signed)

## 2018-01-22 ENCOUNTER — Telehealth: Payer: Self-pay

## 2018-01-22 NOTE — Telephone Encounter (Signed)
Patient called to verify upcoming appointment on 7/12. Per 7/9 phone msg return

## 2018-01-24 NOTE — Progress Notes (Signed)
[facility]  [phone] [phone]  Clinic Follow up Note   Patient Care Team: [name], MD as PCP - General (Internal Medicine) 01/25/2018  DIAGNOSIS: Polycythemia Vera   CURRENT TREATMENT:   1. Phlebotomy as needed if HCT>45%.  2. Hydrea decreased to 500mg  daily 4-5 days/week since 02/2015; increased to take hydrea 6 days/week starting 02/08/17 (hold on Mondays). Increase to Hydrea 500mg  BID on Mondays and 500mg  once daily for the rest of week starting 12/13/17. On 01/25/18 plt 57K, hold hydrea x2 days then restart 500 mg daily 5 days/week (hold Wednesday and Saturday) 3. Xarelto 20 mg started about mid-February 2015 and off after 6 month, restarted again in 02/2015 due to left LE DVT   INTERVAL HISTORY: Mr. Hanrahan returns for follow up PV and phlebotomy as scheduled. Last phlebotomy 6/27 with Hct 48.1. He continues hydrea 500 mg BID on mondays and daily Tues - Sunday as prescribed on last f/u with Dr. Burr Medico on 12/13/17. He does not feel great today but nothing specific or out of his ordinary. Energy level is low but at baseline. Appetite is normal. No n/v/c/d. Denies bleeding of any kind. Left leg pain at baseline. Denies swelling. No cough, chest pain, or dyspnea.   REVIEW OF SYSTEMS:   Constitutional: Denies fevers, chills or abnormal weight loss (+) fatigue  Eyes: Denies blurriness of vision Ears, nose, mouth, throat, and face: Denies mucositis, epistaxis, or sore throat Respiratory: Denies cough, dyspnea or wheezes Cardiovascular: Denies palpitation, chest discomfort or lower extremity swelling  Gastrointestinal:  Denies nausea,vomiting, constipation, diarrhea, rectal bleeding, heartburn or change in bowel habits GU: denies hematuria  Skin: Denies abnormal skin rashes (+) generalized bruising  Lymphatics: Denies new lymphadenopathy or easy bruising Neurological:Denies numbness, tingling or new weaknesses Behavioral/Psych: Mood is  stable, no new changes  MSK: (+) left leg pain at baseline  All other systems were reviewed with the patient and are negative.  MEDICAL HISTORY:  Past Medical History:  Diagnosis Date  . Bone marrow disease   . BPH (benign prostatic hypertrophy)   . Depression   . DJD (degenerative joint disease)   . Dyslipidemia   . Gout   . HTN (hypertension)   . Mild aortic stenosis   . Polycythemia vera(238.4)     SURGICAL HISTORY: Past Surgical History:  Procedure Laterality Date  . ENDOVENOUS ABLATION SAPHENOUS VEIN W/ LASER Left 09/04/2016   endovenous laser ablation left greater saphenous vein by Tinnie Gens MD  . HEMORROIDECTOMY      I have reviewed the social history and family history with the patient and they are unchanged from previous note.  ALLERGIES:  is allergic to amoxicillin and colchicine.  MEDICATIONS:  Current Outpatient Medications  Medication Sig Dispense Refill  . allopurinol (ZYLOPRIM) 100 MG tablet Take 100 mg by mouth 2 (two) times daily.     Marland Kitchen ALPRAZolam (XANAX) 0.5 MG tablet Take 0.5 mg by mouth daily as needed for anxiety.     Marland Kitchen escitalopram (LEXAPRO) 10 MG tablet Take 5 mg by mouth at bedtime.     Marland Kitchen glucosamine-chondroitin 500-400 MG tablet Take 1 tablet by mouth daily.    . hydroxyurea (HYDREA) 500 MG capsule 1 tab daily in the morning except 1 tab twice daily on Mondays 40 capsule 0  . lisinopril (PRINIVIL,ZESTRIL) 10 MG tablet Take 10 mg by mouth daily.    . Multiple Vitamin (MULTIVITAMIN WITH MINERALS) TABS tablet Take 1 tablet by mouth daily.    Marland Kitchen  omega-3 acid ethyl esters (LOVAZA) 1 g capsule Take 1 g by mouth daily.    . rivaroxaban (XARELTO) 20 MG TABS tablet Take 20 mg by mouth daily with supper.    . terazosin (HYTRIN) 5 MG capsule Take 5 mg by mouth at bedtime.    . traMADol (ULTRAM) 50 MG tablet Take 50 mg by mouth every 8 (eight) hours as needed for moderate pain.     . vitamin E (VITAMIN E) 400 UNIT capsule Take 400 Units by mouth daily.      No current facility-administered medications for this visit.     PHYSICAL EXAMINATION: ECOG PERFORMANCE STATUS: 2 - Symptomatic, <50% confined to bed  Vitals:   01/25/18 1034  BP: 139/74  Pulse: 81  Resp: 18  Temp: 97.8 F (36.6 C)  SpO2: 96%   Filed Weights   01/25/18 1034  Weight: 172 lb 9.6 oz (78.3 kg)    GENERAL:alert, no distress and comfortable SKIN: no rashes or significant lesions EYES: normal, Conjunctiva are pink and non-injected, sclera clear OROPHARYNX:no thrush, ulcers, or ecchymosis  LYMPH:  no palpable cervical or supraclavicular lymphadenopathy LUNGS: clear to auscultation with normal breathing effort HEART: regular rate & rhythm and no murmurs and no lower extremity edema ABDOMEN:abdomen soft, non-tender and normal bowel sounds Musculoskeletal:no cyanosis of digits and no clubbing  NEURO: alert & oriented x 3 with fluent speech, no focal motor/sensory deficits  LABORATORY DATA:  I have reviewed the data as listed CBC Latest Ref Rng & Units 01/25/2018 01/10/2018 12/26/2017  WBC 4.0 - 10.3 K/uL 10.8(H) 13.5(H) 14.6(H)  Hemoglobin 13.0 - 17.1 g/dL 14.4 15.0 14.5  Hematocrit 38.4 - 49.9 % 45.1 48.1 46.7  Platelets 140 - 400 K/uL 57(L) 121(L) 81(L)     CMP Latest Ref Rng & Units 01/25/2018 11/01/2017 08/09/2017  Glucose 70 - 99 mg/dL 106(H) 78 96  BUN 8 - 23 mg/dL 24(H) 31(H) 34(H)  Creatinine 0.61 - 1.24 mg/dL 1.35(H) 1.54(H) 1.29  Sodium 135 - 145 mmol/L 138 139 140  Potassium 3.5 - 5.1 mmol/L 4.6 4.8 4.7  Chloride 98 - 111 mmol/L 104 104 106  CO2 22 - 32 mmol/L 29 30(H) 26  Calcium 8.9 - 10.3 mg/dL 8.9 9.0 8.7  Total Protein 6.5 - 8.1 g/dL 6.0(L) 6.2(L) 5.9(L)  Total Bilirubin 0.3 - 1.2 mg/dL 0.6 0.5 0.5  Alkaline Phos 38 - 126 U/L 74 79 73  AST 15 - 41 U/L 17 18 21   ALT 0 - 44 U/L 13 13 15       RADIOGRAPHIC STUDIES: I have personally reviewed the radiological images as listed and agreed with the findings in the report. No results found.    ASSESSMENT & PLAN: TAISON CELANI 84 y.o. male with a history of Polycythemia vera ([place])   1. Polycythemia vera diagnosed in 1997, JAK2 mutation positive.  2 RLE DVT, LLE DVT in 02/2015, likely secondary to #1 3. Left low abdomen and groin pain from hernia  4.  Heart murmur, SOB, fatigue 5. Thrombocytopenia, secondary to hydrea   Mr. Selbe appears stable. On f/u 12/13/17 Hydrea was increased to 500 mg BID on Monday, 500 mg daily on Tues-Sunday, platelets were 108K at that time. He has been compliant with that dose since then. Today platelet count is 57K. He has no evidence of bleeding. He will hold hydrea for 2 days. On Monday 7/15 he will restart 500 mg daily on 5 days per week (hold Wednesday and Saturday). Will recheck labs 02/04/18,  one week after restart. Hct is 45.1% today, he requests to hold phlebotomy which is reasonable. Last performed 01/10/18 for Hct 48.1%. Will return in 3-4 weeks for lab and f/u with Dr. Burr Medico and phlebotomy if Hct >45%. We reviewed bleeding precautions. He is on xarelto. He knows to hold medication and call [place] with bleeding of any kind. The plan was reviewed with Dr. Burr Medico.   PLAN: -Labs reviewed, plt 57K, Hct 45.1% - hold hydrea x2 days, no phlebotomy today  -Beginning 7/15, restart hydrea 500 mg daily 5 days/week (hold Wednesday and Saturday) -Lab 7/22 -Lab, f/u with Dr. Burr Medico, and phlebotomy in 3-4 weeks -Patient to call with any bleeding   All questions were answered. The patient knows to call the clinic with any problems, questions or concerns. No barriers to learning was detected. I spent 20 minutes counseling the patient face to face. The total time spent in the appointment was 25 minutes and more than 50% was on counseling and review of test results     Alla Feeling, NP 01/25/18

## 2018-01-25 ENCOUNTER — Inpatient Hospital Stay: Payer: Medicare Other

## 2018-01-25 ENCOUNTER — Inpatient Hospital Stay: Payer: Medicare Other | Attending: Hematology | Admitting: Nurse Practitioner

## 2018-01-25 ENCOUNTER — Encounter: Payer: Self-pay | Admitting: Nurse Practitioner

## 2018-01-25 VITALS — BP 139/74 | HR 81 | Temp 97.8°F | Resp 18 | Ht 71.0 in | Wt 172.6 lb

## 2018-01-25 DIAGNOSIS — D45 Polycythemia vera: Secondary | ICD-10-CM | POA: Diagnosis not present

## 2018-01-25 DIAGNOSIS — D6959 Other secondary thrombocytopenia: Secondary | ICD-10-CM | POA: Diagnosis not present

## 2018-01-25 DIAGNOSIS — Z86718 Personal history of other venous thrombosis and embolism: Secondary | ICD-10-CM | POA: Insufficient documentation

## 2018-01-25 DIAGNOSIS — D696 Thrombocytopenia, unspecified: Secondary | ICD-10-CM

## 2018-01-25 DIAGNOSIS — Z7901 Long term (current) use of anticoagulants: Secondary | ICD-10-CM

## 2018-01-25 LAB — COMPREHENSIVE METABOLIC PANEL
ALBUMIN: 3.9 g/dL (ref 3.5–5.0)
ALK PHOS: 74 U/L (ref 38–126)
ALT: 13 U/L (ref 0–44)
ANION GAP: 5 (ref 5–15)
AST: 17 U/L (ref 15–41)
BILIRUBIN TOTAL: 0.6 mg/dL (ref 0.3–1.2)
BUN: 24 mg/dL — ABNORMAL HIGH (ref 8–23)
CALCIUM: 8.9 mg/dL (ref 8.9–10.3)
CO2: 29 mmol/L (ref 22–32)
Chloride: 104 mmol/L (ref 98–111)
Creatinine, Ser: 1.35 mg/dL — ABNORMAL HIGH (ref 0.61–1.24)
GFR, EST AFRICAN AMERICAN: 51 mL/min — AB (ref >60)
GFR, EST NON AFRICAN AMERICAN: 44 mL/min — AB (ref >60)
GLUCOSE: 106 mg/dL — AB (ref 70–99)
Potassium: 4.6 mmol/L (ref 3.5–5.1)
Sodium: 138 mmol/L (ref 135–145)
TOTAL PROTEIN: 6 g/dL — AB (ref 6.5–8.1)

## 2018-01-25 LAB — CBC WITH DIFFERENTIAL/PLATELET
BASOS ABS: 0.1 10*3/uL (ref 0.0–0.1)
BASOS PCT: 1 %
Eosinophils Absolute: 0.1 10*3/uL (ref 0.0–0.5)
Eosinophils Relative: 1 %
HEMATOCRIT: 45.1 % (ref 38.4–49.9)
Hemoglobin: 14.4 g/dL (ref 13.0–17.1)
Lymphocytes Relative: 8 %
Lymphs Abs: 0.9 10*3/uL (ref 0.9–3.3)
MCH: 33.2 pg (ref 27.2–33.4)
MCHC: 31.9 g/dL — ABNORMAL LOW (ref 32.0–36.0)
MCV: 104 fL — ABNORMAL HIGH (ref 79.3–98.0)
Monocytes Absolute: 0.4 10*3/uL (ref 0.1–0.9)
Monocytes Relative: 4 %
NEUTROS ABS: 9.3 10*3/uL — AB (ref 1.5–6.5)
Neutrophils Relative %: 86 %
PLATELETS: 57 10*3/uL — AB (ref 140–400)
RBC: 4.33 MIL/uL (ref 4.20–5.82)
RDW: 17.6 % — ABNORMAL HIGH (ref 11.0–14.6)
WBC: 10.8 10*3/uL — AB (ref 4.0–10.3)

## 2018-02-04 ENCOUNTER — Inpatient Hospital Stay: Payer: Medicare Other

## 2018-02-04 DIAGNOSIS — D45 Polycythemia vera: Secondary | ICD-10-CM

## 2018-02-04 DIAGNOSIS — Z86718 Personal history of other venous thrombosis and embolism: Secondary | ICD-10-CM | POA: Diagnosis not present

## 2018-02-04 DIAGNOSIS — D6959 Other secondary thrombocytopenia: Secondary | ICD-10-CM | POA: Diagnosis not present

## 2018-02-04 DIAGNOSIS — Z7901 Long term (current) use of anticoagulants: Secondary | ICD-10-CM | POA: Diagnosis not present

## 2018-02-04 LAB — COMPREHENSIVE METABOLIC PANEL
ALT: 12 U/L (ref 0–44)
ANION GAP: 7 (ref 5–15)
AST: 19 U/L (ref 15–41)
Albumin: 3.9 g/dL (ref 3.5–5.0)
Alkaline Phosphatase: 73 U/L (ref 38–126)
BILIRUBIN TOTAL: 0.6 mg/dL (ref 0.3–1.2)
BUN: 25 mg/dL — AB (ref 8–23)
CHLORIDE: 104 mmol/L (ref 98–111)
CO2: 28 mmol/L (ref 22–32)
CREATININE: 1.31 mg/dL — AB (ref 0.61–1.24)
Calcium: 9 mg/dL (ref 8.9–10.3)
GFR calc Af Amer: 53 mL/min — ABNORMAL LOW (ref >60)
GFR calc non Af Amer: 46 mL/min — ABNORMAL LOW (ref >60)
GLUCOSE: 118 mg/dL — AB (ref 70–99)
POTASSIUM: 4.6 mmol/L (ref 3.5–5.1)
Sodium: 139 mmol/L (ref 135–145)
TOTAL PROTEIN: 6.1 g/dL — AB (ref 6.5–8.1)

## 2018-02-04 LAB — CBC WITH DIFFERENTIAL/PLATELET
Basophils Absolute: 0.2 10*3/uL — ABNORMAL HIGH (ref 0.0–0.1)
Basophils Relative: 2 %
EOS PCT: 1 %
Eosinophils Absolute: 0.1 10*3/uL (ref 0.0–0.5)
HCT: 46.1 % (ref 38.4–49.9)
Hemoglobin: 14.4 g/dL (ref 13.0–17.1)
Lymphocytes Relative: 12 %
Lymphs Abs: 1.3 10*3/uL (ref 0.9–3.3)
MCH: 33.3 pg (ref 27.2–33.4)
MCHC: 31.2 g/dL — AB (ref 32.0–36.0)
MCV: 106.5 fL — ABNORMAL HIGH (ref 79.3–98.0)
MONO ABS: 0.7 10*3/uL (ref 0.1–0.9)
Monocytes Relative: 7 %
Neutro Abs: 7.9 10*3/uL — ABNORMAL HIGH (ref 1.5–6.5)
Neutrophils Relative %: 78 %
PLATELETS: 105 10*3/uL — AB (ref 140–400)
RBC: 4.33 MIL/uL (ref 4.20–5.82)
RDW: 15.3 % — AB (ref 11.0–14.6)
WBC: 10.2 10*3/uL (ref 4.0–10.3)

## 2018-02-07 ENCOUNTER — Telehealth: Payer: Self-pay | Admitting: *Deleted

## 2018-02-07 NOTE — Telephone Encounter (Signed)
-----   Message from Truitt Merle, MD sent at 02/07/2018 10:17 AM EDT ----- [name] [name], please let pt know the lab results from 2 days ago, thrombocytopenia improved, continue current dose hydrea, no other concerns, thnaks   Truitt Merle  02/07/2018

## 2018-02-07 NOTE — Telephone Encounter (Signed)
Per note from Dr Burr Medico. Called lm for patient advising lab results show improvement.  Continue current dose of Hydrea.  Call if should have any questions.

## 2018-02-20 ENCOUNTER — Telehealth: Payer: Self-pay

## 2018-02-20 NOTE — Telephone Encounter (Signed)
Spoke with patient concerning r/s his appointments Per 8/7 phone que it was approved by Burr Medico for date change per RN Janifer Adie) in between communication with me.Levada Dy) Due to patient wife has a care giver who work hours start at [place]. Patient okay with new appointment time. Mailed a letter with a calender enclosed.

## 2018-02-20 NOTE — Telephone Encounter (Signed)
Per 8/7 phone que. Patient wanted change appointment. Forward it to [employer]

## 2018-02-22 ENCOUNTER — Telehealth: Payer: Self-pay | Admitting: Hematology

## 2018-02-22 ENCOUNTER — Inpatient Hospital Stay: Payer: Medicare Other | Admitting: Hematology

## 2018-02-22 ENCOUNTER — Inpatient Hospital Stay: Payer: Medicare Other

## 2018-02-22 NOTE — Telephone Encounter (Signed)
Spoke w/ pt re appts that were added per 8/7 sch msg

## 2018-02-26 ENCOUNTER — Inpatient Hospital Stay (HOSPITAL_BASED_OUTPATIENT_CLINIC_OR_DEPARTMENT_OTHER): Payer: Medicare Other | Admitting: Hematology

## 2018-02-26 ENCOUNTER — Inpatient Hospital Stay: Payer: Medicare Other

## 2018-02-26 ENCOUNTER — Encounter: Payer: Self-pay | Admitting: Hematology

## 2018-02-26 ENCOUNTER — Telehealth: Payer: Self-pay | Admitting: Hematology

## 2018-02-26 ENCOUNTER — Inpatient Hospital Stay: Payer: Medicare Other | Attending: Hematology

## 2018-02-26 VITALS — BP 132/79 | HR 82 | Temp 98.1°F | Resp 16 | Ht 71.0 in | Wt 174.0 lb

## 2018-02-26 DIAGNOSIS — I358 Other nonrheumatic aortic valve disorders: Secondary | ICD-10-CM | POA: Insufficient documentation

## 2018-02-26 DIAGNOSIS — D45 Polycythemia vera: Secondary | ICD-10-CM | POA: Diagnosis not present

## 2018-02-26 DIAGNOSIS — K4090 Unilateral inguinal hernia, without obstruction or gangrene, not specified as recurrent: Secondary | ICD-10-CM | POA: Diagnosis not present

## 2018-02-26 DIAGNOSIS — Z86718 Personal history of other venous thrombosis and embolism: Secondary | ICD-10-CM

## 2018-02-26 DIAGNOSIS — D6959 Other secondary thrombocytopenia: Secondary | ICD-10-CM | POA: Insufficient documentation

## 2018-02-26 DIAGNOSIS — Z7901 Long term (current) use of anticoagulants: Secondary | ICD-10-CM | POA: Insufficient documentation

## 2018-02-26 LAB — CBC WITH DIFFERENTIAL/PLATELET
BASOS ABS: 0.2 10*3/uL — AB (ref 0.0–0.1)
Basophils Relative: 2 %
EOS ABS: 0.1 10*3/uL (ref 0.0–0.5)
EOS PCT: 1 %
HCT: 46.9 % (ref 38.4–49.9)
Hemoglobin: 14.8 g/dL (ref 13.0–17.1)
Lymphocytes Relative: 10 %
Lymphs Abs: 1.2 10*3/uL (ref 0.9–3.3)
MCH: 33.3 pg (ref 27.2–33.4)
MCHC: 31.6 g/dL — ABNORMAL LOW (ref 32.0–36.0)
MCV: 105.4 fL — AB (ref 79.3–98.0)
MONO ABS: 0.7 10*3/uL (ref 0.1–0.9)
Monocytes Relative: 5 %
Neutro Abs: 10.4 10*3/uL — ABNORMAL HIGH (ref 1.5–6.5)
Neutrophils Relative %: 82 %
PLATELETS: 94 10*3/uL — AB (ref 140–400)
RBC: 4.45 MIL/uL (ref 4.20–5.82)
RDW: 14 % (ref 11.0–14.6)
WBC: 12.6 10*3/uL — AB (ref 4.0–10.3)

## 2018-02-26 NOTE — Telephone Encounter (Signed)
Scheduled appt per 8/13 los - gave patient AVS and calender per los.  

## 2018-02-26 NOTE — Progress Notes (Signed)
[name] [name] OFFICE PROGRESS NOTE  [name], [place] [address]  Date of Service:  02/26/2018   DIAGNOSIS: Follow up for Polycythemia Vera   CURRENT TREATMENT:   1. Phlebotomy as needed if HCT>47%.  2. Hydrea decreased to 500mg  daily 4-5 days/week since 02/2015; increased to take hydrea 6 days/week starting 02/08/17 (hold on Mondays). Increase to Hydrea 500mg  BID on Mondays and 500mg  once daily for the rest of week starting 12/13/17. Due to thormbocytopenia we decreased him to 500mg  5days/week on 01/28/18, hold Mondays and thursdays.  3. Xarelto 20 mg started about mid-February 2015 and off after 6 month, restarted again in 02/2015 due to left LE DVT  INTERVAL HISTORY:   [name] [name] 84 y.o. male with a history of Polycythemia vera dating back to 1997, JAK2 mutation positive is here for follow up. He presents to the clinic today by himself. He notes he is doing well overall. He is now taking Hydrea 5 days a week and not taking them on Monday and Thursday.His last phlebotomy was 2 months ago. The skin on his legs and feet have no breakdown. He note he is still able to be active adequately. He notes he is wear a hernia belt to help his hernia. He was denied surgery due to risk of blood clot.  He notes 30 years ago he had a serious depression and he is no Lexapro. He has not needed Xanax in 10 years. He notes taking care of his wife due to her dementia. He notes he prefers morning appointments due to schedule.      MEDICAL HISTORY: Past Medical History:  Diagnosis Date  . Bone marrow disease   . BPH (benign prostatic hypertrophy)   . Depression   . DJD (degenerative joint disease)   . Dyslipidemia   . Gout   . HTN (hypertension)   . Mild aortic stenosis   . Polycythemia vera(238.4)     INTERIM HISTORY: has Polycythemia vera ([place]); Aneurysm of unspecified site [facility]); DVT (deep venous thrombosis) ([place]); Thrombocytopenia ([place]); Varicose veins of  bilateral lower extremities with other complications; Unilateral primary osteoarthritis, left hip; and Pain in left hip on their problem list.    ALLERGIES:  is allergic to amoxicillin and colchicine.  MEDICATIONS: has a current medication list which includes the following prescription(s): acetaminophen, allopurinol, alprazolam, escitalopram, fish oil-omega-3 fatty acids, hydroxyurea, lisinopril, multivitamin, rivaroxaban, terazosin, tramadol, and vitamin e.  SURGICAL HISTORY:  Past Surgical History:  Procedure Laterality Date  . ENDOVENOUS ABLATION SAPHENOUS VEIN W/ LASER Left 09/04/2016   endovenous laser ablation left greater saphenous vein by Tinnie Gens MD  . HEMORROIDECTOMY      REVIEW OF SYSTEMS:   Constitutional: Denies fevers, chills or abnormal weight loss   Eyes: Denies blurriness of vision Ears, nose, mouth, throat, and face: Denies mucositis or sore throat Respiratory: (+) SOB upon walking/exertion  Cardiovascular: Denies palpitation, chest discomfort or lower extremity swelling  Gastrointestinal:  Denies nausea, heartburn or change in bowel habits (+) wears hernia belt Skin: Denies abnormal skin rashes  Lymphatics: Denies new lymphadenopathy or easy bruising Neurological:Denies numbness, tingling or new weaknesses Behavioral/Psych: Mood is stable, no new changes  All other systems were reviewed with the patient and are negative.  PHYSICAL EXAMINATION:  ECOG PERFORMANCE STATUS: 1  Vitals:   02/26/18 1116  BP: 132/79  Pulse: 82  Resp: 16  Temp: 98.1 F (36.7 C)  TempSrc: Oral  SpO2: 96%  Weight: 174 lb (  78.9 kg)  Height: 5\' 11"  (1.803 m)    General: Easily ambulatory.  HEENT: Sclerae anicteric. Conjunctivae were pink. Pupils round and reactive bilaterally. Oral mucosa is moist without ulceration or thrush.  No occipital, submandibular, cervical, supraclavicular or axillar adenopathy.  Lungs: clear to auscultation without wheezes. No rales or rhonchi.   Heart: regular rate. No gallop or rubs. (+) heart murmur  Abdomen: soft No guarding or rebound tenderness. No palpable hepatosplenomegaly. Extremities: No clubbing or cyanosis.No calf tenderness to palpitation, no peripheral edema. The patient had grossly intact strength in upper and lower extremities.  Skin: Diffuse erythema and skin pigmentation in the left lower extremity above the ankle, no ulcers or blisters.   Neuro: non-focal, alert and oriented to time, person and place, appropriate affect   Labs:  CBC Latest Ref Rng & Units 02/26/2018 02/04/2018 01/25/2018  WBC 4.0 - 10.3 K/uL 12.6(H) 10.2 10.8(H)  Hemoglobin 13.0 - 17.1 g/dL 14.8 14.4 14.4  Hematocrit 38.4 - 49.9 % 46.9 46.1 45.1  Platelets 140 - 400 K/uL 94(L) 105(L) 57(L)   CMP Latest Ref Rng & Units 02/04/2018 01/25/2018 11/01/2017  Glucose 70 - 99 mg/dL 118(H) 106(H) 78  BUN 8 - 23 mg/dL 25(H) 24(H) 31(H)  Creatinine 0.61 - 1.24 mg/dL 1.31(H) 1.35(H) 1.54(H)  Sodium 135 - 145 mmol/L 139 138 139  Potassium 3.5 - 5.1 mmol/L 4.6 4.6 4.8  Chloride 98 - 111 mmol/L 104 104 104  CO2 22 - 32 mmol/L 28 29 30(H)  Calcium 8.9 - 10.3 mg/dL 9.0 8.9 9.0  Total Protein 6.5 - 8.1 g/dL 6.1(L) 6.0(L) 6.2(L)  Total Bilirubin 0.3 - 1.2 mg/dL 0.6 0.6 0.5  Alkaline Phos 38 - 126 U/L 73 74 79  AST 15 - 41 U/L 19 17 18   ALT 0 - 44 U/L 12 13 13     RADIOGRAPHIC STUDIES: No results found.   ASSESSMENT: [name] [name] 84 y.o. male with a history of Polycythemia vera ([place])   1. Polycythemia vera diagnosed in 1997, JAK2 mutation positive.  -We previously reviewed the nature history of PV, most people do very well, some people would develop myelofibrosis or leukemia late on. The major complication is thrombosis. -He has been on Hydrea 500 mg daily for 4-5 years, due to the worsening thrombocytopenia, I decreased to 500 mg 4 days a week (M, W, F, Sat and Sun), and subsequently gradually increased to 500 daily due to elevated hematocrit -He has  been requiring phlebotomy more frequently lately -On 12/13/17 I increased in Hydrea to 500mg  BID on Mondays and once daily Tuesday-Sunday. -He developed thrombocytopenia at 57K and NP Lacie held his hydrea for 2 days then reduced his dose to 500mg  5 days/week, hold on Sundays and Thursdays starting 01/28/18.  -Labs reviewed, Plt increased to 94K and HCT at 46.9% today. Current dose is balancing his blood counts. Will repeat labs in 2-3 week and schedule phlebotomy to have if needed. Due to his advanced age, he will do phlebotomy if HCT>47% -I discussed the option of interferon injections which will have less impact on his platelet. If his Plt counts are not able to be maintained I will switch him to injections. Will continue Hydrea 500mg  5 days/week, hold on Mondays and Thursdays. -F/u in 2 months    2 RLE DVT, LLE DVT in 02/2015, likely secondary to #1 -I recommend him to continue Xarelto indefinitely, due to recurrent DVT. -He knows to be careful about fall and injury -He had vascular surgery on his  left leg on 09/04/16 for poor circulation. Laser ablation of the left great saphenous vein from the proximal calf to near the saphenofemoral junction performed under local tumescent anesthesia by Dr. Kellie Simmering.  3. Left low abdomen and groin pain from hernia  -Pt has pain and limps with walk, exam showed LUQ abdominal tenderness, no lymphadenopathy in the groin area -Has seen PCP and orthopedic surgeon, received injection for pain but did not resolve.  -He had CT scan on 03/07/17 and was found to have a left inguinal hernia. He was seen by surgeon, he will hold on surgery for now due risk for blood clots.   4. Heart murmur, SOB and Fatigue -pt complains slightly worsening fatigue and dyspnea on exertion since his acute gastritis in 05/2017  -he has mild AS on echo in 2011, exam today showed significant systolic murmur at aortic area, suspect from AS  -I will send note to PCP, Dr. Dagmar Hait, I suggest him to f/u  with Dr. Dagmar Hait and see if he needs repeat ECHO (I offered today, he wants to wait)  -fatigue stable, still remains active adequately.   5. Thrombocytopenia, secondary to hydrea  -01/25/18 Plt ct at 57K. NP Lacie previously had him hold Hydrea for 2 days then reduce Hydrea to 500mg  daily 5days/week, hold on Wednesday and Saturday.  -Plt increased to 94K today (02/26/18)  PLAN:  -Continue 500mg  daily 5 days/week, hold on Mondays and Thursdays    -Labs reviewed, Hct at 46.9% and PLt at 94K, no phlebotomy today   -Lab and phlebotomy in 2-3 weeks  -Lab and f/u in 8 weeks     All questions were answered. The patient knows to call the clinic with any problems, questions or concerns. We can certainly see the patient much sooner if necessary.  I spent 15 minutes counseling the patient face to face. The total time spent in the appointment was 20 minutes.    Truitt Merle, MD 02/26/2018     I, Joslyn Devon, am acting as scribe for Truitt Merle, MD.   I have reviewed the above documentation for accuracy and completeness, and I agree with the above.

## 2018-02-27 ENCOUNTER — Other Ambulatory Visit: Payer: Medicare Other

## 2018-02-27 ENCOUNTER — Ambulatory Visit: Payer: Medicare Other | Admitting: Hematology

## 2018-03-08 ENCOUNTER — Ambulatory Visit: Payer: Medicare Other | Admitting: Nurse Practitioner

## 2018-03-08 ENCOUNTER — Other Ambulatory Visit: Payer: Medicare Other

## 2018-03-11 ENCOUNTER — Other Ambulatory Visit: Payer: Medicare Other

## 2018-03-11 ENCOUNTER — Other Ambulatory Visit: Payer: Self-pay | Admitting: Hematology

## 2018-03-11 ENCOUNTER — Inpatient Hospital Stay: Payer: Medicare Other

## 2018-03-11 ENCOUNTER — Ambulatory Visit: Payer: Medicare Other | Admitting: Hematology

## 2018-03-11 VITALS — BP 118/97 | HR 94 | Temp 97.8°F | Resp 18

## 2018-03-11 DIAGNOSIS — K4090 Unilateral inguinal hernia, without obstruction or gangrene, not specified as recurrent: Secondary | ICD-10-CM | POA: Diagnosis not present

## 2018-03-11 DIAGNOSIS — D6959 Other secondary thrombocytopenia: Secondary | ICD-10-CM | POA: Diagnosis not present

## 2018-03-11 DIAGNOSIS — D45 Polycythemia vera: Secondary | ICD-10-CM

## 2018-03-11 DIAGNOSIS — I358 Other nonrheumatic aortic valve disorders: Secondary | ICD-10-CM | POA: Diagnosis not present

## 2018-03-11 DIAGNOSIS — Z7901 Long term (current) use of anticoagulants: Secondary | ICD-10-CM | POA: Diagnosis not present

## 2018-03-11 DIAGNOSIS — Z86718 Personal history of other venous thrombosis and embolism: Secondary | ICD-10-CM | POA: Diagnosis not present

## 2018-03-11 LAB — CBC WITH DIFFERENTIAL (CANCER CENTER ONLY)
BASOS PCT: 1 %
Basophils Absolute: 0.2 10*3/uL — ABNORMAL HIGH (ref 0.0–0.1)
EOS PCT: 1 %
Eosinophils Absolute: 0.1 10*3/uL (ref 0.0–0.5)
HEMATOCRIT: 48.2 % (ref 38.4–49.9)
Hemoglobin: 15 g/dL (ref 13.0–17.1)
Lymphocytes Relative: 6 %
Lymphs Abs: 0.9 10*3/uL (ref 0.9–3.3)
MCH: 32.1 pg (ref 27.2–33.4)
MCHC: 31.1 g/dL — AB (ref 32.0–36.0)
MCV: 103.1 fL — AB (ref 79.3–98.0)
MONO ABS: 0.8 10*3/uL (ref 0.1–0.9)
MONOS PCT: 5 %
Neutro Abs: 12 10*3/uL — ABNORMAL HIGH (ref 1.5–6.5)
Neutrophils Relative %: 87 %
PLATELETS: 128 10*3/uL — AB (ref 140–400)
RBC: 4.68 MIL/uL (ref 4.20–5.82)
RDW: 14.2 % (ref 11.0–14.6)
WBC Count: 14 10*3/uL — ABNORMAL HIGH (ref 4.0–10.3)

## 2018-03-11 NOTE — Patient Instructions (Signed)

## 2018-03-11 NOTE — Progress Notes (Signed)
[name] [name] presents today for phlebotomy per MD orders. Phlebotomy procedure started at 1043 RAC accessed with 16 gauge phlebotomy kit and ended at 1053 IV needle removed intact. 500 grams removed. Patient observed for 30 minutes after procedure without any incident. Food and drinks provided. Vital signs stable. Patient tolerated procedure well.

## 2018-03-15 ENCOUNTER — Telehealth (INDEPENDENT_AMBULATORY_CARE_PROVIDER_SITE_OTHER): Payer: Self-pay

## 2018-03-15 NOTE — Telephone Encounter (Signed)
Patient called and left a VM wanting to schedule another left hip injection with Dr. Ernestina Patches.  Stated that he received an injection in January with Dr. Ernestina Patches.  Cb# is [phone].  Please advise.

## 2018-03-19 NOTE — Telephone Encounter (Signed)
Pt is scheduled for appt 04/01/18

## 2018-03-19 NOTE — Telephone Encounter (Signed)
Should be ok to repeat.

## 2018-03-19 NOTE — Telephone Encounter (Signed)
Called pt and he states pain in left hip and last injection gave him 50% of relief, no new traumas/injuries.  Please see message below.

## 2018-04-01 ENCOUNTER — Ambulatory Visit (INDEPENDENT_AMBULATORY_CARE_PROVIDER_SITE_OTHER): Payer: Medicare Other | Admitting: Physical Medicine and Rehabilitation

## 2018-04-01 ENCOUNTER — Encounter (INDEPENDENT_AMBULATORY_CARE_PROVIDER_SITE_OTHER): Payer: Self-pay | Admitting: Physical Medicine and Rehabilitation

## 2018-04-01 ENCOUNTER — Ambulatory Visit (INDEPENDENT_AMBULATORY_CARE_PROVIDER_SITE_OTHER): Payer: Self-pay

## 2018-04-01 DIAGNOSIS — M25552 Pain in left hip: Secondary | ICD-10-CM | POA: Diagnosis not present

## 2018-04-01 NOTE — Progress Notes (Signed)
[name] [name] - 84 y.o. male MRN [id number]  Date of birth: 05-Jul-1924  Office Visit Note: Visit Date: 04/01/2018 PCP: [name], MD Referred by: [name], MD  Subjective: Chief Complaint  Patient presents with  . Left Hip - Pain   HPI: [name] [name] is a 84 year old gentleman followed by Dr. Jean Rosenthal and Benita Stabile, PA-C.  We have completed left intra-articular hip injections on 2 occasions I believe both with really good relief of symptoms.  The last injection was in February.  He does have severe osteoarthritis of the left hip.  He has painful range of motion and pain in the left hip and groin.  He is also been diagnosed with a hernia and is wearing a truss.  We are going to repeat the injection today he will follow-up with Dr. Ninfa Linden.   ROS Otherwise per HPI.  Assessment & Plan: Visit Diagnoses:  1. Pain in left hip     Plan: Findings:  Patient had decent relief during the anesthetic phase of the injection.  Patient still had mechanical complaints.    Meds & Orders: No orders of the defined types were placed in this encounter.   Orders Placed This Encounter  Procedures  . Large Joint Inj: L hip joint  . XR C-ARM NO REPORT    Follow-up: Return if symptoms worsen or fail to improve, for Dr. Juanna Cao, PA-C.   Procedures: Large Joint Inj: L hip joint on 04/01/2018 10:41 AM Indications: diagnostic evaluation and pain Details: 22 G 3.5 in needle, fluoroscopy-guided anterior approach  Arthrogram: No  Medications: 3 mL bupivacaine 0.5 %; 80 mg triamcinolone acetonide 40 MG/ML Outcome: tolerated well, no immediate complications  There was excellent flow of contrast producing a partial arthrogram of the hip. The patient did have relief of symptoms during the anesthetic phase of the injection. Procedure, treatment alternatives, risks and benefits explained, specific risks discussed. Consent was given by the patient. Immediately prior to  procedure a time out was called to verify the correct patient, procedure, equipment, support staff and site/side marked as required. Patient was prepped and draped in the usual sterile fashion.      No notes on file   Clinical History: No specialty comments available.   He reports that he has never smoked. He has never used smokeless tobacco. No results for input(s): HGBA1C, LABURIC in the last 8760 hours.  Objective:  VS:  HT:    WT:   BMI:     BP:   HR: bpm  TEMP: ( )  RESP:  Physical Exam  Ortho Exam Imaging: Xr C-arm No Report  Result Date: 04/01/2018 Please see Notes tab for imaging impression.   Past Medical/Family/Surgical/Social History: Medications & Allergies reviewed per EMR, new medications updated. Patient Active Problem List   Diagnosis Date Noted  . Unilateral primary osteoarthritis, left hip 01/08/2017  . Pain in left hip 01/08/2017  . Varicose veins of bilateral lower extremities with other complications 40/98/1191  . Thrombocytopenia ([place]) 11/18/2015  . DVT (deep venous thrombosis) ([place]) 09/26/2013  . Aneurysm of unspecified site ([place]) 02/26/2012  . Polycythemia vera ([place]) 05/30/2011   Past Medical History:  Diagnosis Date  . Bone marrow disease   . BPH (benign prostatic hypertrophy)   . Depression   . DJD (degenerative joint disease)   . Dyslipidemia   . Gout   . HTN (hypertension)   . Mild aortic stenosis   . Polycythemia vera(238.4)    Family History  Problem Relation Age of Onset  . Hypertension Mother    Past Surgical History:  Procedure Laterality Date  . ENDOVENOUS ABLATION SAPHENOUS VEIN W/ LASER Left 09/04/2016   endovenous laser ablation left greater saphenous vein by Tinnie Gens MD  . HEMORROIDECTOMY     Social History   Occupational History  . Not on file  Tobacco Use  . Smoking status: Never Smoker  . Smokeless tobacco: Never Used  Substance and Sexual Activity  . Alcohol use: Yes    Alcohol/week: 7.0 standard drinks     Types: 7 Glasses of wine per week    Comment: red wine  . Drug use: No  . Sexual activity: Not on file

## 2018-04-01 NOTE — Patient Instructions (Signed)

## 2018-04-01 NOTE — Progress Notes (Signed)
 .  Numeric Pain Rating Scale and Functional Assessment Average Pain 4   In the last MONTH (on 0-10 scale) has pain interfered with the following?  1. General activity like being  able to carry out your everyday physical activities such as walking, climbing stairs, carrying groceries, or moving a chair?  Rating(1)   -Dye Allergies.

## 2018-04-02 MED ORDER — TRIAMCINOLONE ACETONIDE 40 MG/ML IJ SUSP
80.0000 mg | INTRAMUSCULAR | Status: AC | PRN
  Administered 2018-04-01: 80 mg via INTRA_ARTICULAR

## 2018-04-02 MED ORDER — BUPIVACAINE HCL 0.5 % IJ SOLN
3.0000 mL | INTRAMUSCULAR | Status: AC | PRN
  Administered 2018-04-01: 3 mL via INTRA_ARTICULAR

## 2018-04-19 NOTE — Progress Notes (Signed)
[name] [name] OFFICE PROGRESS NOTE  [name] [name], [place] [address]  Date of Service:  04/22/2018   DIAGNOSIS: Follow up for Polycythemia Vera   CURRENT TREATMENT:   1. Phlebotomy as needed if HCT>47%.  2. Hydrea decreased to 500mg  daily 4-5 days/week since 02/2015; increased to take hydrea 6 days/week starting 02/08/17 (hold on Mondays). Increase to Hydrea 500mg  BID on Mondays and 500mg  once daily for the rest of week starting 12/13/17. Due to thormbocytopenia we decreased him to 500mg  5days/week on 01/28/18, hold Mondays and thursdays.  3. Xarelto 20 mg started about mid-February 2015 and off after 6 month, restarted again in 02/2015 due to left LE DVT  INTERVAL HISTORY:   [name] [name] 84 y.o. male with a history of Polycythemia vera dating back to 1997, JAK2 mutation positive is here for follow up. He has been following up with [name] [name] in the interim. Today, he is here with his wife. He is doing well. He bruises easily and has a bruise on his left arm. He is on Xarelto. He denies nose or gum bleeding. No blood in urine or stool. He did well with phlebotomy last time.  His LLs are discolored, and he relates that to blood clots. He states that he elevates his legs when he sleeps, and he tries to avoid long sitting or standing. He has an inguinal hernia.     MEDICAL HISTORY: Past Medical History:  Diagnosis Date  . Bone marrow disease   . BPH (benign prostatic hypertrophy)   . Depression   . DJD (degenerative joint disease)   . Dyslipidemia   . Gout   . HTN (hypertension)   . Mild aortic stenosis   . Polycythemia vera(238.4)     INTERIM HISTORY: has Polycythemia vera ([place]); Aneurysm of unspecified site [facility]); DVT (deep venous thrombosis) ([place]); Thrombocytopenia ([place]); Varicose veins of bilateral lower extremities with other complications; Unilateral primary osteoarthritis, left hip; and Pain in left hip on their problem list.     ALLERGIES:  is allergic to amoxicillin and colchicine.  MEDICATIONS: has a current medication list which includes the following prescription(s): acetaminophen, allopurinol, alprazolam, escitalopram, fish oil-omega-3 fatty acids, hydroxyurea, lisinopril, multivitamin, rivaroxaban, terazosin, tramadol, and vitamin e.  SURGICAL HISTORY:  Past Surgical History:  Procedure Laterality Date  . ENDOVENOUS ABLATION SAPHENOUS VEIN W/ LASER Left 09/04/2016   endovenous laser ablation left greater saphenous vein by [name] Gens MD  . HEMORROIDECTOMY      REVIEW OF SYSTEMS:   Constitutional: Denies fevers, chills or abnormal weight loss   Eyes: Denies blurriness of vision Ears, nose, mouth, throat, and face: Denies mucositis or sore throat Respiratory: No new dyspnea, cough, or wheezing. Cardiovascular: Denies palpitation, chest discomfort or lower extremity swelling  Gastrointestinal:  Denies nausea, heartburn or change in bowel habits (+) wears hernia belt Skin: Denies abnormal skin rashes  (+) bruise on left arm Lymphatics: Denies new lymphadenopathy or easy bruising Neurological:Denies numbness, tingling or new weaknesses Behavioral/Psych: Mood is stable, no new changes  All other systems were reviewed with the patient and are negative.  PHYSICAL EXAMINATION:  ECOG PERFORMANCE STATUS: 1  Vitals:   04/22/18 1149  BP: 132/80  Pulse: 86  Resp: 17  Temp: 97.6 F (36.4 C)  TempSrc: Oral  SpO2: 96%  Weight: 174 lb 3.2 oz (79 kg)  Height: 5\' 11"  (1.803 m)    General: Easily ambulatory.  HEENT: Sclerae anicteric. Conjunctivae were pink. Pupils round and reactive  bilaterally. Oral mucosa is moist without ulceration or thrush.  No occipital, submandibular, cervical, supraclavicular or axillar adenopathy.  Lungs: clear to auscultation without wheezes. No rales or rhonchi.  Heart: regular rate. No gallop or rubs. (+) heart murmur  Abdomen: soft No guarding or rebound tenderness. No  palpable hepatosplenomegaly. Extremities: No clubbing or cyanosis.No calf tenderness to palpitation, no peripheral edema. The patient had grossly intact strength in upper and lower extremities.  Skin:(+) Diffuse erythema and skin pigmentation in the left lower extremity above the ankle, no ulcers or blisters.  (+) bruise on left arm Neuro: non-focal, alert and oriented to time, person and place, appropriate affect   Labs:  CBC Latest Ref Rng & Units 04/22/2018 03/11/2018 02/26/2018  WBC 4.0 - 10.3 K/uL 14.4(H) 14.0(H) 12.6(H)  Hemoglobin 13.0 - 17.1 g/dL 14.6 15.0 14.8  Hematocrit 38.4 - 49.9 % 46.9 48.2 46.9  Platelets 140 - 400 K/uL 96(L) 128(L) 94(L)   CMP Latest Ref Rng & Units 04/22/2018 02/04/2018 01/25/2018  Glucose 70 - 99 mg/dL 110(H) 118(H) 106(H)  BUN 8 - 23 mg/dL 21 25(H) 24(H)  Creatinine 0.61 - 1.24 mg/dL 1.32(H) 1.31(H) 1.35(H)  Sodium 135 - 145 mmol/L 139 139 138  Potassium 3.5 - 5.1 mmol/L 4.9 4.6 4.6  Chloride 98 - 111 mmol/L 103 104 104  CO2 22 - 32 mmol/L 30 28 29   Calcium 8.9 - 10.3 mg/dL 9.2 9.0 8.9  Total Protein 6.5 - 8.1 g/dL 6.1(L) 6.1(L) 6.0(L)  Total Bilirubin 0.3 - 1.2 mg/dL 0.7 0.6 0.6  Alkaline Phos 38 - 126 U/L 86 73 74  AST 15 - 41 U/L 19 19 17   ALT 0 - 44 U/L 14 12 13     RADIOGRAPHIC STUDIES: No results found.   ASSESSMENT:  [name] [name] 84 y.o. male with a history of Polycythemia vera ([place])  Needs flu shot - Plan: Influenza vac split quadrivalent PF (FLUARIX) injection 0.5 mL   1. Polycythemia vera diagnosed in 1997, JAK2 mutation positive.  -We previously reviewed the nature history of PV, most people do very well, some people would develop myelofibrosis or leukemia late on. The major complication is thrombosis. -He has been on Hydrea 500 mg daily for 4-5 years, due to the worsening thrombocytopenia, I decreased to 500 mg 5 days a week, starting 01/28/18.  -He is on Xarelto for previous multiple DVTs.  Thrombocytopenia certainly increase his  risk of bleeding. -We will continue phlebotomy if hematocrit more than 47% (instead of 45% due to his advanced age), he has required phlebotomy every 2 to 3 months, tolerating well. -Labs reviewed, CBC Hg 14.6 WBC 14.4 Platelets 96K. -Continue lab and phlebotomy if hematocrit more than 47% monthly -F/u in 4 months   2 RLE DVT, LLE DVT in 02/2015, likely secondary to #1 -I recommend him to continue Xarelto indefinitely, due to recurrent DVT. -He knows to be careful about fall and injury -He had vascular surgery on his left leg on 09/04/16 for poor circulation. Laser ablation of the left great saphenous vein from the proximal calf to near the saphenofemoral junction performed under local tumescent anesthesia by Dr. Kellie Simmering. -He knows to elevate his legs when he sits  3. Left low abdomen and groin pain from hernia  -Pt has pain and limps with walk, exam showed LUQ abdominal tenderness, no lymphadenopathy in the groin area -Has seen PCP and orthopedic surgeon, received injection for pain but did not resolve.  -He had CT scan on 03/07/17 and was found  to have a left inguinal hernia. He was seen by surgeon, he will hold on surgery for now due risk for blood clots.   4. Heart murmur, SOB and Fatigue -pt complains slightly worsening fatigue and dyspnea on exertion since his acute gastritis in 05/2017  -he has mild AS on echo in 2011, exam previously showed significant systolic murmur at aortic area, suspect from AS  -I will send note to PCP, Dr. Dagmar Hait, I suggest him to f/u with Dr. Dagmar Hait and see if he needs repeat ECHO (I offered previously, he wanted to wait)  -fatigue stable, still remains active adequately.   5. Thrombocytopenia, secondary to hydrea  -01/25/18 Plt ct at 57K. NP Lacie previously had him hold Hydrea for 2 days then reduce Hydrea to 500mg  daily 5days/week. -Plt increased to 96K today  PLAN:  -f/u in 4 months - Lab and phlebotomy every monthX4 if HCT>47%   All questions were  answered. The patient knows to call the clinic with any problems, questions or concerns. We can certainly see the patient much sooner if necessary.  I spent 15 minutes counseling the patient face to face. The total time spent in the appointment was 20 minutes.  Dierdre Searles Dweik am acting as scribe for Dr. Truitt Merle.  I have reviewed the above documentation for accuracy and completeness, and I agree with the above.     Truitt Merle, MD 04/22/2018

## 2018-04-22 ENCOUNTER — Inpatient Hospital Stay: Payer: Medicare Other | Attending: Hematology | Admitting: Hematology

## 2018-04-22 ENCOUNTER — Inpatient Hospital Stay: Payer: Medicare Other

## 2018-04-22 ENCOUNTER — Encounter: Payer: Self-pay | Admitting: Hematology

## 2018-04-22 VITALS — BP 132/80 | HR 86 | Temp 97.6°F | Resp 17 | Ht 71.0 in | Wt 174.2 lb

## 2018-04-22 DIAGNOSIS — Z23 Encounter for immunization: Secondary | ICD-10-CM | POA: Insufficient documentation

## 2018-04-22 DIAGNOSIS — Z79899 Other long term (current) drug therapy: Secondary | ICD-10-CM | POA: Diagnosis not present

## 2018-04-22 DIAGNOSIS — Z7901 Long term (current) use of anticoagulants: Secondary | ICD-10-CM | POA: Diagnosis not present

## 2018-04-22 DIAGNOSIS — Z86718 Personal history of other venous thrombosis and embolism: Secondary | ICD-10-CM | POA: Diagnosis not present

## 2018-04-22 DIAGNOSIS — K4090 Unilateral inguinal hernia, without obstruction or gangrene, not specified as recurrent: Secondary | ICD-10-CM | POA: Diagnosis not present

## 2018-04-22 DIAGNOSIS — D45 Polycythemia vera: Secondary | ICD-10-CM | POA: Insufficient documentation

## 2018-04-22 DIAGNOSIS — D6959 Other secondary thrombocytopenia: Secondary | ICD-10-CM | POA: Insufficient documentation

## 2018-04-22 LAB — COMPREHENSIVE METABOLIC PANEL
ALBUMIN: 3.7 g/dL (ref 3.5–5.0)
ALK PHOS: 86 U/L (ref 38–126)
ALT: 14 U/L (ref 0–44)
AST: 19 U/L (ref 15–41)
Anion gap: 6 (ref 5–15)
BILIRUBIN TOTAL: 0.7 mg/dL (ref 0.3–1.2)
BUN: 21 mg/dL (ref 8–23)
CALCIUM: 9.2 mg/dL (ref 8.9–10.3)
CO2: 30 mmol/L (ref 22–32)
CREATININE: 1.32 mg/dL — AB (ref 0.61–1.24)
Chloride: 103 mmol/L (ref 98–111)
GFR calc Af Amer: 53 mL/min — ABNORMAL LOW (ref >60)
GFR calc non Af Amer: 45 mL/min — ABNORMAL LOW (ref >60)
GLUCOSE: 110 mg/dL — AB (ref 70–99)
Potassium: 4.9 mmol/L (ref 3.5–5.1)
Sodium: 139 mmol/L (ref 135–145)
Total Protein: 6.1 g/dL — ABNORMAL LOW (ref 6.5–8.1)

## 2018-04-22 LAB — CBC WITH DIFFERENTIAL/PLATELET
BASOS ABS: 0.2 10*3/uL — AB (ref 0.0–0.1)
BASOS PCT: 1 %
EOS ABS: 0.2 10*3/uL (ref 0.0–0.5)
EOS PCT: 1 %
HCT: 46.9 % (ref 38.4–49.9)
Hemoglobin: 14.6 g/dL (ref 13.0–17.1)
LYMPHS ABS: 1.1 10*3/uL (ref 0.9–3.3)
LYMPHS PCT: 8 %
MCH: 30.5 pg (ref 27.2–33.4)
MCHC: 31.1 g/dL — AB (ref 32.0–36.0)
MCV: 98.1 fL — ABNORMAL HIGH (ref 79.3–98.0)
MONO ABS: 0.7 10*3/uL (ref 0.1–0.9)
MONOS PCT: 5 %
Neutro Abs: 12.2 10*3/uL — ABNORMAL HIGH (ref 1.5–6.5)
Neutrophils Relative %: 85 %
PLATELETS: 96 10*3/uL — AB (ref 140–400)
RBC: 4.78 MIL/uL (ref 4.20–5.82)
RDW: 15.3 % — AB (ref 11.0–14.6)
WBC: 14.4 10*3/uL — ABNORMAL HIGH (ref 4.0–10.3)

## 2018-04-22 MED ORDER — INFLUENZA VAC SPLIT QUAD 0.5 ML IM SUSY
0.5000 mL | PREFILLED_SYRINGE | Freq: Once | INTRAMUSCULAR | Status: AC
  Administered 2018-04-22: 0.5 mL via INTRAMUSCULAR

## 2018-04-22 MED ORDER — INFLUENZA VAC SPLIT QUAD 0.5 ML IM SUSY
PREFILLED_SYRINGE | INTRAMUSCULAR | Status: AC
  Filled 2018-04-22: qty 0.5

## 2018-05-23 ENCOUNTER — Inpatient Hospital Stay: Payer: Medicare Other | Attending: Hematology

## 2018-05-23 ENCOUNTER — Inpatient Hospital Stay: Payer: Medicare Other

## 2018-05-23 DIAGNOSIS — D45 Polycythemia vera: Secondary | ICD-10-CM | POA: Insufficient documentation

## 2018-05-23 LAB — CBC WITH DIFFERENTIAL/PLATELET
Abs Immature Granulocytes: 0.18 10*3/uL — ABNORMAL HIGH (ref 0.00–0.07)
BASOS PCT: 2 %
Basophils Absolute: 0.4 10*3/uL — ABNORMAL HIGH (ref 0.0–0.1)
EOS ABS: 0.2 10*3/uL (ref 0.0–0.5)
Eosinophils Relative: 1 %
HCT: 50 % (ref 39.0–52.0)
Hemoglobin: 15 g/dL (ref 13.0–17.0)
Immature Granulocytes: 1 %
Lymphocytes Relative: 8 %
Lymphs Abs: 1.6 10*3/uL (ref 0.7–4.0)
MCH: 29.3 pg (ref 26.0–34.0)
MCHC: 30 g/dL (ref 30.0–36.0)
MCV: 97.7 fL (ref 80.0–100.0)
MONO ABS: 1 10*3/uL (ref 0.1–1.0)
Monocytes Relative: 5 %
NEUTROS ABS: 16.1 10*3/uL — AB (ref 1.7–7.7)
Neutrophils Relative %: 83 %
PLATELETS: 133 10*3/uL — AB (ref 150–400)
RBC: 5.12 MIL/uL (ref 4.22–5.81)
RDW: 14.7 % (ref 11.5–15.5)
WBC: 19.5 10*3/uL — AB (ref 4.0–10.5)
nRBC: 0 % (ref 0.0–0.2)

## 2018-05-23 NOTE — Patient Instructions (Signed)
Therapeutic Phlebotomy Discharge Instructions  - Increase your fluid intake over the next 4 hours  - No smoking for 30 minutes  - Avoid using the affected arm (the one you had the blood drawn from) for heavy lifting or other activities.  - You may resume all normal activities after 30 minutes.  You are to notify the office if you experience:   - Persistent dizziness and/or lightheadedness -Uncontrolled or excessive bleeding at the site.   Therapeutic Phlebotomy, Care After Refer to this sheet in the next few weeks. These instructions provide you with information about caring for yourself after your procedure. Your health care provider may also give you more specific instructions. Your treatment has been planned according to current medical practices, but problems sometimes occur. Call your health care provider if you have any problems or questions after your procedure. What can I expect after the procedure? After the procedure, it is common to have:  Light-headedness or dizziness. You may feel faint.  Nausea.  Tiredness.  Follow these instructions at home: Activity  Return to your normal activities as directed by your health care provider. Most people can go back to their normal activities right away.  Avoid strenuous physical activity and heavy lifting or pulling for about 5 hours after the procedure. Do not lift anything that is heavier than 10 lb (4.5 kg).  Athletes should avoid strenuous exercise for at least 12 hours.  Change positions slowly for the remainder of the day. This will help to prevent light-headedness or fainting.  If you feel light-headed, lie down until the feeling goes away. Eating and drinking  Be sure to eat well-balanced meals for the next 24 hours.  Drink enough fluid to keep your urine clear or pale yellow.  Avoid drinking alcohol on the day that you had the procedure. Care of the Needle Insertion Site  Keep your bandage dry. You can remove the  bandage after about 5 hours or as directed by your health care provider.  If you have bleeding from the needle insertion site, elevate your arm and press firmly on the site until the bleeding stops.  If you have bruising at the site, apply ice to the area: ? Put ice in a plastic bag. ? Place a towel between your skin and the bag. ? Leave the ice on for 20 minutes, 2-3 times a day for the first 24 hours.  If the swelling does not go away after 24 hours, apply a warm, moist washcloth to the area for 20 minutes, 2-3 times a day. General instructions  Avoid smoking for at least 30 minutes after the procedure.  Keep all follow-up visits as directed by your health care provider. It is important to continue with further therapeutic phlebotomy treatments as directed. Contact a health care provider if:  You have redness, swelling, or pain at the needle insertion site.  You have fluid, blood, or pus coming from the needle insertion site.  You feel light-headed, dizzy, or nauseated, and the feeling does not go away.  You notice new bruising at the needle insertion site.  You feel weaker than normal.  You have a fever or chills. Get help right away if:  You have severe nausea or vomiting.  You have chest pain.  You have trouble breathing. This information is not intended to replace advice given to you by your health care provider. Make sure you discuss any questions you have with your health care provider. Document Released: 12/05/2010 Document Revised: 03/04/2016   Document Reviewed: 06/29/2014 Elsevier Interactive Patient Education  2018 Elsevier Inc.   

## 2018-05-23 NOTE — Progress Notes (Signed)
[name] [name] presents today for phlebotomy per MD orders. Phlebotomy procedure started at 1103 and ended at 1111. Phlebotomy kit with attached needle used; performed by Christianne Borrow., RN. 503 grams removed. Patient observed for 30 minutes after procedure without any incident. Patient tolerated procedure well. IV needle removed intact.

## 2018-06-21 ENCOUNTER — Inpatient Hospital Stay: Payer: Medicare Other

## 2018-06-21 ENCOUNTER — Inpatient Hospital Stay: Payer: Medicare Other | Attending: Hematology

## 2018-06-21 VITALS — BP 96/65 | HR 75 | Temp 97.8°F | Resp 17

## 2018-06-21 DIAGNOSIS — D45 Polycythemia vera: Secondary | ICD-10-CM | POA: Diagnosis not present

## 2018-06-21 LAB — CBC WITH DIFFERENTIAL/PLATELET
ABS IMMATURE GRANULOCYTES: 0.14 10*3/uL — AB (ref 0.00–0.07)
BASOS PCT: 1 %
Basophils Absolute: 0.3 10*3/uL — ABNORMAL HIGH (ref 0.0–0.1)
Eosinophils Absolute: 0.2 10*3/uL (ref 0.0–0.5)
Eosinophils Relative: 1 %
HCT: 46.2 % (ref 39.0–52.0)
Hemoglobin: 14.1 g/dL (ref 13.0–17.0)
IMMATURE GRANULOCYTES: 1 %
LYMPHS ABS: 1.5 10*3/uL (ref 0.7–4.0)
Lymphocytes Relative: 8 %
MCH: 29.4 pg (ref 26.0–34.0)
MCHC: 30.5 g/dL (ref 30.0–36.0)
MCV: 96.5 fL (ref 80.0–100.0)
Monocytes Absolute: 0.8 10*3/uL (ref 0.1–1.0)
Monocytes Relative: 4 %
NEUTROS ABS: 15.1 10*3/uL — AB (ref 1.7–7.7)
NEUTROS PCT: 85 %
PLATELETS: 104 10*3/uL — AB (ref 150–400)
RBC: 4.79 MIL/uL (ref 4.22–5.81)
RDW: 15.9 % — AB (ref 11.5–15.5)
WBC: 17.9 10*3/uL — ABNORMAL HIGH (ref 4.0–10.5)
nRBC: 0 % (ref 0.0–0.2)

## 2018-06-21 NOTE — Progress Notes (Signed)
Phlebotlmy complete, with 550cc withdrawn from right AC.Marland Kitchen Post VSS.

## 2018-07-22 ENCOUNTER — Inpatient Hospital Stay: Payer: Medicare Other | Attending: Hematology

## 2018-07-22 ENCOUNTER — Inpatient Hospital Stay: Payer: Medicare Other

## 2018-07-22 DIAGNOSIS — D45 Polycythemia vera: Secondary | ICD-10-CM | POA: Diagnosis not present

## 2018-07-22 LAB — COMPREHENSIVE METABOLIC PANEL WITH GFR
ALT: 16 U/L (ref 0–44)
AST: 21 U/L (ref 15–41)
Albumin: 3.8 g/dL (ref 3.5–5.0)
Alkaline Phosphatase: 81 U/L (ref 38–126)
Anion gap: 7 (ref 5–15)
BUN: 26 mg/dL — ABNORMAL HIGH (ref 8–23)
CO2: 29 mmol/L (ref 22–32)
Calcium: 9 mg/dL (ref 8.9–10.3)
Chloride: 105 mmol/L (ref 98–111)
Creatinine, Ser: 1.43 mg/dL — ABNORMAL HIGH (ref 0.61–1.24)
GFR calc Af Amer: 49 mL/min — ABNORMAL LOW (ref >60)
GFR calc non Af Amer: 42 mL/min — ABNORMAL LOW (ref >60)
Glucose, Bld: 94 mg/dL (ref 70–99)
Potassium: 4.7 mmol/L (ref 3.5–5.1)
Sodium: 141 mmol/L (ref 135–145)
Total Bilirubin: 0.6 mg/dL (ref 0.3–1.2)
Total Protein: 6.2 g/dL — ABNORMAL LOW (ref 6.5–8.1)

## 2018-07-22 LAB — CBC WITH DIFFERENTIAL/PLATELET
Abs Immature Granulocytes: 0.17 10*3/uL — ABNORMAL HIGH (ref 0.00–0.07)
Basophils Absolute: 0.3 10*3/uL — ABNORMAL HIGH (ref 0.0–0.1)
Basophils Relative: 2 %
Eosinophils Absolute: 0.2 10*3/uL (ref 0.0–0.5)
Eosinophils Relative: 1 %
HCT: 47.8 % (ref 39.0–52.0)
HEMOGLOBIN: 14.1 g/dL (ref 13.0–17.0)
Immature Granulocytes: 1 %
LYMPHS PCT: 9 %
Lymphs Abs: 1.6 10*3/uL (ref 0.7–4.0)
MCH: 29.7 pg (ref 26.0–34.0)
MCHC: 29.5 g/dL — ABNORMAL LOW (ref 30.0–36.0)
MCV: 100.8 fL — ABNORMAL HIGH (ref 80.0–100.0)
Monocytes Absolute: 0.6 10*3/uL (ref 0.1–1.0)
Monocytes Relative: 3 %
Neutro Abs: 15 10*3/uL — ABNORMAL HIGH (ref 1.7–7.7)
Neutrophils Relative %: 84 %
Platelets: 104 10*3/uL — ABNORMAL LOW (ref 150–400)
RBC: 4.74 MIL/uL (ref 4.22–5.81)
RDW: 16.8 % — ABNORMAL HIGH (ref 11.5–15.5)
WBC: 17.8 10*3/uL — ABNORMAL HIGH (ref 4.0–10.5)
nRBC: 0 % (ref 0.0–0.2)

## 2018-07-22 NOTE — Patient Instructions (Signed)

## 2018-08-21 NOTE — Progress Notes (Signed)
[place]   Telephone:([phone]) [phone] Fax:(336) [phone]   Clinic Follow up Note   Patient Care Team: [name], MD as PCP - General (Internal Medicine)  Date of Service:  08/23/2018  CHIEF COMPLAINT: Follow up for Polycythemia Vera   CURRENT THERAPY:  1. Phlebotomy as needed if HCT>47%.  2. Hydrea decreased to 500mg  daily 4-5 days/week since 02/2015; increased to take hydrea 6 days/week starting 02/08/17 (hold on Mondays). Increase to Hydrea 500mg  BID on Mondays and 500mg  once daily for the rest of week starting 12/13/17. Due to thrombocytopenia we decreased him to 500mg  5days/week on 01/28/18, hold Mondays and thursdays. Returned to [employer] 500mg  daily in Jan 2020 3. Xarelto 20 mg started about mid-February 2015 and off after 6 month, restarted again in 02/2015 due to left LE DVT   INTERVAL HISTORY:  [name] [name] is here for a follow up of Polycythemia Vera. He presents to the clinic today wit his family member. He notes he is doing well overall. He notes he had been taking hydrea once daily an not 5 days a week. He notes bruising easily but no obvious bleeding elsewhere. He notes his energy is still lower, stable. He can still do the activities he needs to and knows how to pace himself.  He notes left leg aching and will take tylenol. He has tramadol but has not used much.      REVIEW OF SYSTEMS:   Constitutional: Denies fevers, chills or abnormal weight loss (+) Stable fatigue  Eyes: Denies blurriness of vision Ears, nose, mouth, throat, and face: Denies mucositis or sore throat Respiratory: Denies cough, dyspnea or wheezes Cardiovascular: Denies palpitation, chest discomfort or lower extremity swelling Gastrointestinal:  Denies nausea, heartburn or change in bowel habits Skin: Denies abnormal skin rashes MSK: (+) Left leg aches, controlled with Tylenol Lymphatics: Denies new lymphadenopathy or easy bruising Neurological:Denies numbness, tingling or new  weaknesses Behavioral/Psych: Mood is stable, no new changes  All other systems were reviewed with the patient and are negative.  MEDICAL HISTORY:  Past Medical History:  Diagnosis Date  . Bone marrow disease   . BPH (benign prostatic hypertrophy)   . Depression   . DJD (degenerative joint disease)   . Dyslipidemia   . Gout   . HTN (hypertension)   . Mild aortic stenosis   . Polycythemia vera(238.4)     SURGICAL HISTORY: Past Surgical History:  Procedure Laterality Date  . ENDOVENOUS ABLATION SAPHENOUS VEIN W/ LASER Left 09/04/2016   endovenous laser ablation left greater saphenous vein by Tinnie Gens MD  . HEMORROIDECTOMY      I have reviewed the social history and family history with the patient and they are unchanged from previous note.  ALLERGIES:  is allergic to amoxicillin and colchicine.  MEDICATIONS:  Current Outpatient Medications  Medication Sig Dispense Refill  . acetaminophen (TYLENOL) 500 MG tablet Take 500 mg by mouth every 6 (six) hours as needed.    Marland Kitchen allopurinol (ZYLOPRIM) 100 MG tablet Take 100 mg by mouth 2 (two) times daily.     Marland Kitchen ALPRAZolam (XANAX) 0.5 MG tablet Take 0.5 mg by mouth daily as needed for anxiety.     Marland Kitchen escitalopram (LEXAPRO) 10 MG tablet Take 5 mg by mouth at bedtime.     Marland Kitchen glucosamine-chondroitin 500-400 MG tablet Take 1 tablet by mouth daily.    . hydroxyurea (HYDREA) 500 MG capsule 1 tab daily in the morning except 1 tab twice daily on Mondays (Patient taking  differently: 500 mg daily. ) 40 capsule 0  . lisinopril (PRINIVIL,ZESTRIL) 10 MG tablet Take 10 mg by mouth daily.    . Multiple Vitamin (MULTIVITAMIN WITH MINERALS) TABS tablet Take 1 tablet by mouth daily.    Marland Kitchen omega-3 acid ethyl esters (LOVAZA) 1 g capsule Take 1 g by mouth daily.    . rivaroxaban (XARELTO) 20 MG TABS tablet Take 20 mg by mouth daily with supper.    . terazosin (HYTRIN) 5 MG capsule Take 5 mg by mouth at bedtime.    . traMADol (ULTRAM) 50 MG tablet Take 50 mg  by mouth every 8 (eight) hours as needed for moderate pain.     . vitamin E (VITAMIN E) 400 UNIT capsule Take 400 Units by mouth daily.     No current facility-administered medications for this visit.     PHYSICAL EXAMINATION: ECOG PERFORMANCE STATUS: 2 - Symptomatic, <50% confined to bed  Vitals:   08/23/18 1052 08/23/18 1054  BP: (!) 154/72 (!) 143/71  Pulse: 82   Resp: 16   Temp: 98.2 F (36.8 C)   SpO2: 95%    Filed Weights   08/23/18 1052  Weight: 180 lb 9.6 oz (81.9 kg)    GENERAL:alert, no distress and comfortable SKIN: skin color, texture, turgor are normal, no rashes or significant lesions (+) Diffuse b/l forearm bruising (+) Diffuse b/l lower leg skin erythema and hyperpigmentation, no ulcers or open wound EYES: normal, Conjunctiva are pink and non-injected, sclera clear OROPHARYNX:no exudate, no erythema and lips, buccal mucosa, and tongue normal  NECK: supple, thyroid normal size, non-tender, without nodularity LYMPH: no palpable lymphadenopathy in the cervical, axillary or inguinal LUNGS: clear to auscultation and percussion with normal breathing effort HEART: regular rate & rhythm and no murmurs and no lower extremity edema ABDOMEN:abdomen soft, non-tender and normal bowel sounds Musculoskeletal:no cyanosis of digits and no clubbing  NEURO: alert & oriented x 3 with fluent speech, no focal motor/sensory deficits  LABORATORY DATA:  I have reviewed the data as listed CBC Latest Ref Rng & Units 08/23/2018 07/22/2018 06/21/2018  WBC 4.0 - 10.5 K/uL 15.7(H) 17.8(H) 17.9(H)  Hemoglobin 13.0 - 17.0 g/dL 13.2 14.1 14.1  Hematocrit 39.0 - 52.0 % 43.4 47.8 46.2  Platelets 150 - 400 K/uL 97(L) 104(L) 104(L)     CMP Latest Ref Rng & Units 07/22/2018 04/22/2018 02/04/2018  Glucose 70 - 99 mg/dL 94 110(H) 118(H)  BUN 8 - 23 mg/dL 26(H) 21 25(H)  Creatinine 0.61 - 1.24 mg/dL 1.43(H) 1.32(H) 1.31(H)  Sodium 135 - 145 mmol/L 141 139 139  Potassium 3.5 - 5.1 mmol/L 4.7 4.9 4.6    Chloride 98 - 111 mmol/L 105 103 104  CO2 22 - 32 mmol/L 29 30 28   Calcium 8.9 - 10.3 mg/dL 9.0 9.2 9.0  Total Protein 6.5 - 8.1 g/dL 6.2(L) 6.1(L) 6.1(L)  Total Bilirubin 0.3 - 1.2 mg/dL 0.6 0.7 0.6  Alkaline Phos 38 - 126 U/L 81 86 73  AST 15 - 41 U/L 21 19 19   ALT 0 - 44 U/L 16 14 12       RADIOGRAPHIC STUDIES: I have personally reviewed the radiological images as listed and agreed with the findings in the report. No results found.   ASSESSMENT & PLAN:  [name] [name] is a 85 y.o. male with   1. Polycythemia vera diagnosed in 1997, JAK2 mutation positive.  -We previously reviewed the nature history of PV, most people do very well, some people would develop myelofibrosis  or leukemia late on. The major complication is thrombosis. -He has been on Hydrea 500 mg daily for 4-5 years with dose adjustment as needed. He is currently on Hydrea 500mg  daily   -He is on Xarelto for previous multiple DVTs. Thrombocytopenia certainly increase his risk of bleeding. -We will continue phlebotomy if hematocrit more than 47% (instead of 45% due to his advanced age), he has required phlebotomy every 2 to 3 months, tolerating well. -Labs reviewed, WBC at 15.7, Hg 13.2, HCT 43.4, PLT at 97K, ANC at 13.1 today. No phlebotomy today  -He has been taking Hydrea daily without skipping days, mild thrombocytopenia is stable, he will continue once daily.  -Continue lab and phlebotomy if hematocrit more than 47% every 2 months -F/u in 6 months   2. RLE DVT, LLE DVT in 02/2015, likely secondary to #1 -I recommend him to continue Xarelto indefinitely, due to recurrent DVT. -He knows to be careful about fall and injury -He had vascular surgery on his left leg on 09/04/16 for poor circulation. Laser ablation of the left great saphenous vein from the proximal calf to near the saphenofemoral junction performed under local tumescent anesthesia by Dr. Kellie Simmering. -He knows to elevate his legs when he sits and to avoid  falls due to risk of bleeding -Continue Xarelto.   3. AS, dyspnea and fatigue -pt complains slightly worsening fatigue and dyspnea on exertion since his acute gastritis in 05/2017  -he has mild AS on echo in 2011, exam previously showed significant systolic murmur at aortic area, suspect from AS  -overall improved, f/u with PCP  -fatigue stable, still remains active adequately.  4. Thrombocytopenia, secondary to hydrea  -Hydrea has been dose adjusted as needed.  -Goal PLT above 70-80K.  -Plt stable at 97K today (08/23/18)  PLAN:  -Labs reviewed, HCT 43.4 today, no phlebotomy today  -Lab in 2, 4 and 6  months with phlebotomy  -F/u in 6 months      No problem-specific Assessment & Plan notes found for this encounter.   No orders of the defined types were placed in this encounter.  All questions were answered. The patient knows to call the clinic with any problems, questions or concerns. No barriers to learning was detected. I spent 10 minutes counseling the patient face to face. The total time spent in the appointment was 15 minutes and more than 50% was on counseling and review of test results     Truitt Merle, MD 08/23/2018   I, Joslyn Devon, am acting as scribe for Truitt Merle, MD.   I have reviewed the above documentation for accuracy and completeness, and I agree with the above.

## 2018-08-23 ENCOUNTER — Inpatient Hospital Stay: Payer: Medicare Other | Attending: Hematology | Admitting: Hematology

## 2018-08-23 ENCOUNTER — Inpatient Hospital Stay: Payer: Medicare Other

## 2018-08-23 ENCOUNTER — Telehealth: Payer: Self-pay

## 2018-08-23 ENCOUNTER — Encounter: Payer: Self-pay | Admitting: Hematology

## 2018-08-23 VITALS — BP 143/71 | HR 82 | Temp 98.2°F | Resp 16 | Ht 71.0 in | Wt 180.6 lb

## 2018-08-23 DIAGNOSIS — R5383 Other fatigue: Secondary | ICD-10-CM

## 2018-08-23 DIAGNOSIS — Z86718 Personal history of other venous thrombosis and embolism: Secondary | ICD-10-CM | POA: Insufficient documentation

## 2018-08-23 DIAGNOSIS — D45 Polycythemia vera: Secondary | ICD-10-CM | POA: Insufficient documentation

## 2018-08-23 DIAGNOSIS — Z7901 Long term (current) use of anticoagulants: Secondary | ICD-10-CM | POA: Insufficient documentation

## 2018-08-23 DIAGNOSIS — D6959 Other secondary thrombocytopenia: Secondary | ICD-10-CM | POA: Diagnosis not present

## 2018-08-23 LAB — CBC WITH DIFFERENTIAL/PLATELET
Abs Immature Granulocytes: 0.18 10*3/uL — ABNORMAL HIGH (ref 0.00–0.07)
BASOS PCT: 2 %
Basophils Absolute: 0.3 10*3/uL — ABNORMAL HIGH (ref 0.0–0.1)
Eosinophils Absolute: 0.2 10*3/uL (ref 0.0–0.5)
Eosinophils Relative: 1 %
HCT: 43.4 % (ref 39.0–52.0)
Hemoglobin: 13.2 g/dL (ref 13.0–17.0)
Immature Granulocytes: 1 %
Lymphocytes Relative: 10 %
Lymphs Abs: 1.5 10*3/uL (ref 0.7–4.0)
MCH: 30.8 pg (ref 26.0–34.0)
MCHC: 30.4 g/dL (ref 30.0–36.0)
MCV: 101.2 fL — ABNORMAL HIGH (ref 80.0–100.0)
Monocytes Absolute: 0.5 10*3/uL (ref 0.1–1.0)
Monocytes Relative: 3 %
Neutro Abs: 13.1 10*3/uL — ABNORMAL HIGH (ref 1.7–7.7)
Neutrophils Relative %: 83 %
PLATELETS: 97 10*3/uL — AB (ref 150–400)
RBC: 4.29 MIL/uL (ref 4.22–5.81)
RDW: 15.9 % — ABNORMAL HIGH (ref 11.5–15.5)
WBC: 15.7 10*3/uL — ABNORMAL HIGH (ref 4.0–10.5)
nRBC: 0 % (ref 0.0–0.2)

## 2018-08-23 NOTE — Telephone Encounter (Signed)
Printed avs and calender of upcoming appointment. Per 2/7 los 

## 2018-08-28 ENCOUNTER — Telehealth (INDEPENDENT_AMBULATORY_CARE_PROVIDER_SITE_OTHER): Payer: Self-pay | Admitting: Physical Medicine and Rehabilitation

## 2018-08-28 NOTE — Telephone Encounter (Signed)
Pt states pain in left hip (groin pain) last injection was 04/01/18 and states 60-70% of relief and would like to have another injection.

## 2018-08-28 NOTE — Telephone Encounter (Signed)
Ok, will need f/up [facility] at some point after this one

## 2018-08-28 NOTE — Telephone Encounter (Signed)
Pt is scheduled for 09/13/2018 for Lt hip inj and F/U with Dr. Ninfa Linden 10/07/2018.

## 2018-08-28 NOTE — Telephone Encounter (Signed)
Pt called about an apt with Dr. Ernestina Patches about a shot in his hip.

## 2018-09-13 ENCOUNTER — Encounter (INDEPENDENT_AMBULATORY_CARE_PROVIDER_SITE_OTHER): Payer: Self-pay | Admitting: Physical Medicine and Rehabilitation

## 2018-09-13 ENCOUNTER — Ambulatory Visit (INDEPENDENT_AMBULATORY_CARE_PROVIDER_SITE_OTHER): Payer: Medicare Other | Admitting: Physical Medicine and Rehabilitation

## 2018-09-13 ENCOUNTER — Ambulatory Visit (INDEPENDENT_AMBULATORY_CARE_PROVIDER_SITE_OTHER): Payer: Self-pay

## 2018-09-13 DIAGNOSIS — M25552 Pain in left hip: Secondary | ICD-10-CM | POA: Diagnosis not present

## 2018-09-13 NOTE — Progress Notes (Signed)
[name] - 85 y.o. male MRN [id number]  Date of birth: June 03, 1924  Office Visit Note: Visit Date: 09/13/2018 PCP: [name], MD Referred by: [name], MD  Subjective: Chief Complaint  Patient presents with  . Left Hip - Pain   HPI: [name] [name] is a 85 y.o. male who comes in today For planned repeat of intra-articular hip injection on the left.  Last injection seem to last about 3 months and gave him about 70% relief which he was happy with.  He has been originally followed by Dr. Jean Rosenthal and Benita Stabile, P.A.-C.  He has not seen them in over a year.  We discussed ongoing hip injection not really being an issue as long as they are limited yearly.  Luckily his R helping for 3 to 4 to 5 months.  We did make an appointment for him to follow-up with Benita Stabile just to review his hip and see if there is any other avenues of help.  He has been told by his primary doctor that he should not have surgery unless it is an emergency.  He is having pain in the left hip and groin consistent with prior end-stage arthritis of the hip.  He has had no new trauma or falls.  ROS Otherwise per HPI.  Assessment & Plan: Visit Diagnoses:  1. Pain in left hip     Plan: No additional findings.   Meds & Orders: No orders of the defined types were placed in this encounter.   Orders Placed This Encounter  Procedures  . Large Joint Inj: L hip joint  . XR C-ARM NO REPORT    Follow-up: No follow-ups on file.   Procedures: Large Joint Inj: L hip joint on 09/13/2018 10:43 AM Indications: diagnostic evaluation and pain Details: 22 G 3.5 in needle, fluoroscopy-guided anterior approach  Arthrogram: No  Medications: 3 mL bupivacaine 0.5 %; 80 mg triamcinolone acetonide 40 MG/ML Aspirate: 2 mL serous and yellow Outcome: tolerated well, no immediate complications  There was excellent flow of contrast producing a partial arthrogram of the hip. The patient did have relief of  symptoms during the anesthetic phase of the injection. Procedure, treatment alternatives, risks and benefits explained, specific risks discussed. Consent was given by the patient. Immediately prior to procedure a time out was called to verify the correct patient, procedure, equipment, support staff and site/side marked as required. Patient was prepped and draped in the usual sterile fashion.      No notes on file   Clinical History: No specialty comments available.   He reports that he has never smoked. He has never used smokeless tobacco. No results for input(s): HGBA1C, LABURIC in the last 8760 hours.  Objective:  VS:  HT:    WT:   BMI:     BP:   HR: bpm  TEMP: ( )  RESP:  Physical Exam  Ortho Exam Imaging: Xr C-arm No Report  Result Date: 09/13/2018 Please see Notes tab for imaging impression.   Past Medical/Family/Surgical/Social History: Medications & Allergies reviewed per EMR, new medications updated. Patient Active Problem List   Diagnosis Date Noted  . Unilateral primary osteoarthritis, left hip 01/08/2017  . Pain in left hip 01/08/2017  . Varicose veins of bilateral lower extremities with other complications 03/54/6568  . Thrombocytopenia ([place]) 11/18/2015  . DVT (deep venous thrombosis) ([place]) 09/26/2013  . Aneurysm of unspecified site ([place]) 02/26/2012  . Polycythemia vera ([place]) 05/30/2011   Past Medical History:  Diagnosis Date  . Bone marrow disease   . BPH (benign prostatic hypertrophy)   . Depression   . DJD (degenerative joint disease)   . Dyslipidemia   . Gout   . HTN (hypertension)   . Mild aortic stenosis   . Polycythemia vera(238.4)    Family History  Problem Relation Age of Onset  . Hypertension Mother    Past Surgical History:  Procedure Laterality Date  . ENDOVENOUS ABLATION SAPHENOUS VEIN W/ LASER Left 09/04/2016   endovenous laser ablation left greater saphenous vein by Tinnie Gens MD  . HEMORROIDECTOMY     Social History    Occupational History  . Not on file  Tobacco Use  . Smoking status: Never Smoker  . Smokeless tobacco: Never Used  Substance and Sexual Activity  . Alcohol use: Yes    Alcohol/week: 7.0 standard drinks    Types: 7 Glasses of wine per week    Comment: red wine  . Drug use: No  . Sexual activity: Not on file

## 2018-09-13 NOTE — Progress Notes (Signed)
 .  Numeric Pain Rating Scale and Functional Assessment Average Pain 7   In the last MONTH (on 0-10 scale) has pain interfered with the following?  1. General activity like being  able to carry out your everyday physical activities such as walking, climbing stairs, carrying groceries, or moving a chair?  Rating(6)   -Dye Allergies.  

## 2018-09-14 MED ORDER — TRIAMCINOLONE ACETONIDE 40 MG/ML IJ SUSP
80.0000 mg | INTRAMUSCULAR | Status: AC | PRN
  Administered 2018-09-13: 80 mg via INTRA_ARTICULAR

## 2018-09-14 MED ORDER — BUPIVACAINE HCL 0.5 % IJ SOLN
3.0000 mL | INTRAMUSCULAR | Status: AC | PRN
  Administered 2018-09-13: 3 mL via INTRA_ARTICULAR

## 2018-10-07 ENCOUNTER — Ambulatory Visit (INDEPENDENT_AMBULATORY_CARE_PROVIDER_SITE_OTHER): Payer: Self-pay | Admitting: Orthopaedic Surgery

## 2018-10-18 ENCOUNTER — Other Ambulatory Visit: Payer: Self-pay | Admitting: Hematology

## 2018-10-18 ENCOUNTER — Inpatient Hospital Stay: Payer: Medicare Other

## 2018-10-18 ENCOUNTER — Inpatient Hospital Stay: Payer: Medicare Other | Attending: Hematology

## 2018-10-18 ENCOUNTER — Other Ambulatory Visit: Payer: Self-pay

## 2018-10-18 VITALS — BP 92/64 | HR 91 | Temp 98.7°F | Resp 17

## 2018-10-18 DIAGNOSIS — D45 Polycythemia vera: Secondary | ICD-10-CM | POA: Insufficient documentation

## 2018-10-18 LAB — CBC WITH DIFFERENTIAL/PLATELET
Abs Immature Granulocytes: 0.16 10*3/uL — ABNORMAL HIGH (ref 0.00–0.07)
Basophils Absolute: 0.3 10*3/uL — ABNORMAL HIGH (ref 0.0–0.1)
Basophils Relative: 2 %
Eosinophils Absolute: 0.1 10*3/uL (ref 0.0–0.5)
Eosinophils Relative: 1 %
HCT: 49.1 % (ref 39.0–52.0)
Hemoglobin: 15.1 g/dL (ref 13.0–17.0)
Immature Granulocytes: 1 %
Lymphocytes Relative: 7 %
Lymphs Abs: 1.1 10*3/uL (ref 0.7–4.0)
MCH: 32.5 pg (ref 26.0–34.0)
MCHC: 30.8 g/dL (ref 30.0–36.0)
MCV: 105.8 fL — ABNORMAL HIGH (ref 80.0–100.0)
Monocytes Absolute: 0.9 10*3/uL (ref 0.1–1.0)
Monocytes Relative: 5 %
Neutro Abs: 14.1 10*3/uL — ABNORMAL HIGH (ref 1.7–7.7)
Neutrophils Relative %: 84 %
Platelets: 124 10*3/uL — ABNORMAL LOW (ref 150–400)
RBC: 4.64 MIL/uL (ref 4.22–5.81)
RDW: 15.4 % (ref 11.5–15.5)
WBC: 16.5 10*3/uL — ABNORMAL HIGH (ref 4.0–10.5)
nRBC: 0 % (ref 0.0–0.2)

## 2018-10-18 LAB — COMPREHENSIVE METABOLIC PANEL
ALT: 18 U/L (ref 0–44)
AST: 20 U/L (ref 15–41)
Albumin: 3.7 g/dL (ref 3.5–5.0)
Alkaline Phosphatase: 80 U/L (ref 38–126)
Anion gap: 9 (ref 5–15)
BUN: 25 mg/dL — ABNORMAL HIGH (ref 8–23)
CO2: 28 mmol/L (ref 22–32)
Calcium: 9 mg/dL (ref 8.9–10.3)
Chloride: 104 mmol/L (ref 98–111)
Creatinine, Ser: 1.39 mg/dL — ABNORMAL HIGH (ref 0.61–1.24)
GFR calc Af Amer: 51 mL/min — ABNORMAL LOW (ref >60)
GFR calc non Af Amer: 44 mL/min — ABNORMAL LOW (ref >60)
Glucose, Bld: 113 mg/dL — ABNORMAL HIGH (ref 70–99)
Potassium: 4.6 mmol/L (ref 3.5–5.1)
Sodium: 141 mmol/L (ref 135–145)
Total Bilirubin: 0.6 mg/dL (ref 0.3–1.2)
Total Protein: 6 g/dL — ABNORMAL LOW (ref 6.5–8.1)

## 2018-10-18 NOTE — Progress Notes (Signed)
[name] [name] presents today for phlebotomy per MD orders. Phlebotomy procedure started at 1125 and ended at 1131. 510 grams removed via 16 gauge needle to Right AC without difficulty.  Patient observed for 30 minutes after procedure without any incident and provided post procedure snacks. Patient tolerated procedure well. IV needle removed intact.

## 2018-10-18 NOTE — Patient Instructions (Signed)

## 2018-12-05 IMAGING — CR DG CHEST 2V
2 series · 2 of 2 positions shown · non-contrast
Comparison: None.

CLINICAL DATA: a left-sided chest pain since last evening.

EXAM:
CHEST  2 VIEW

[w chest pa]
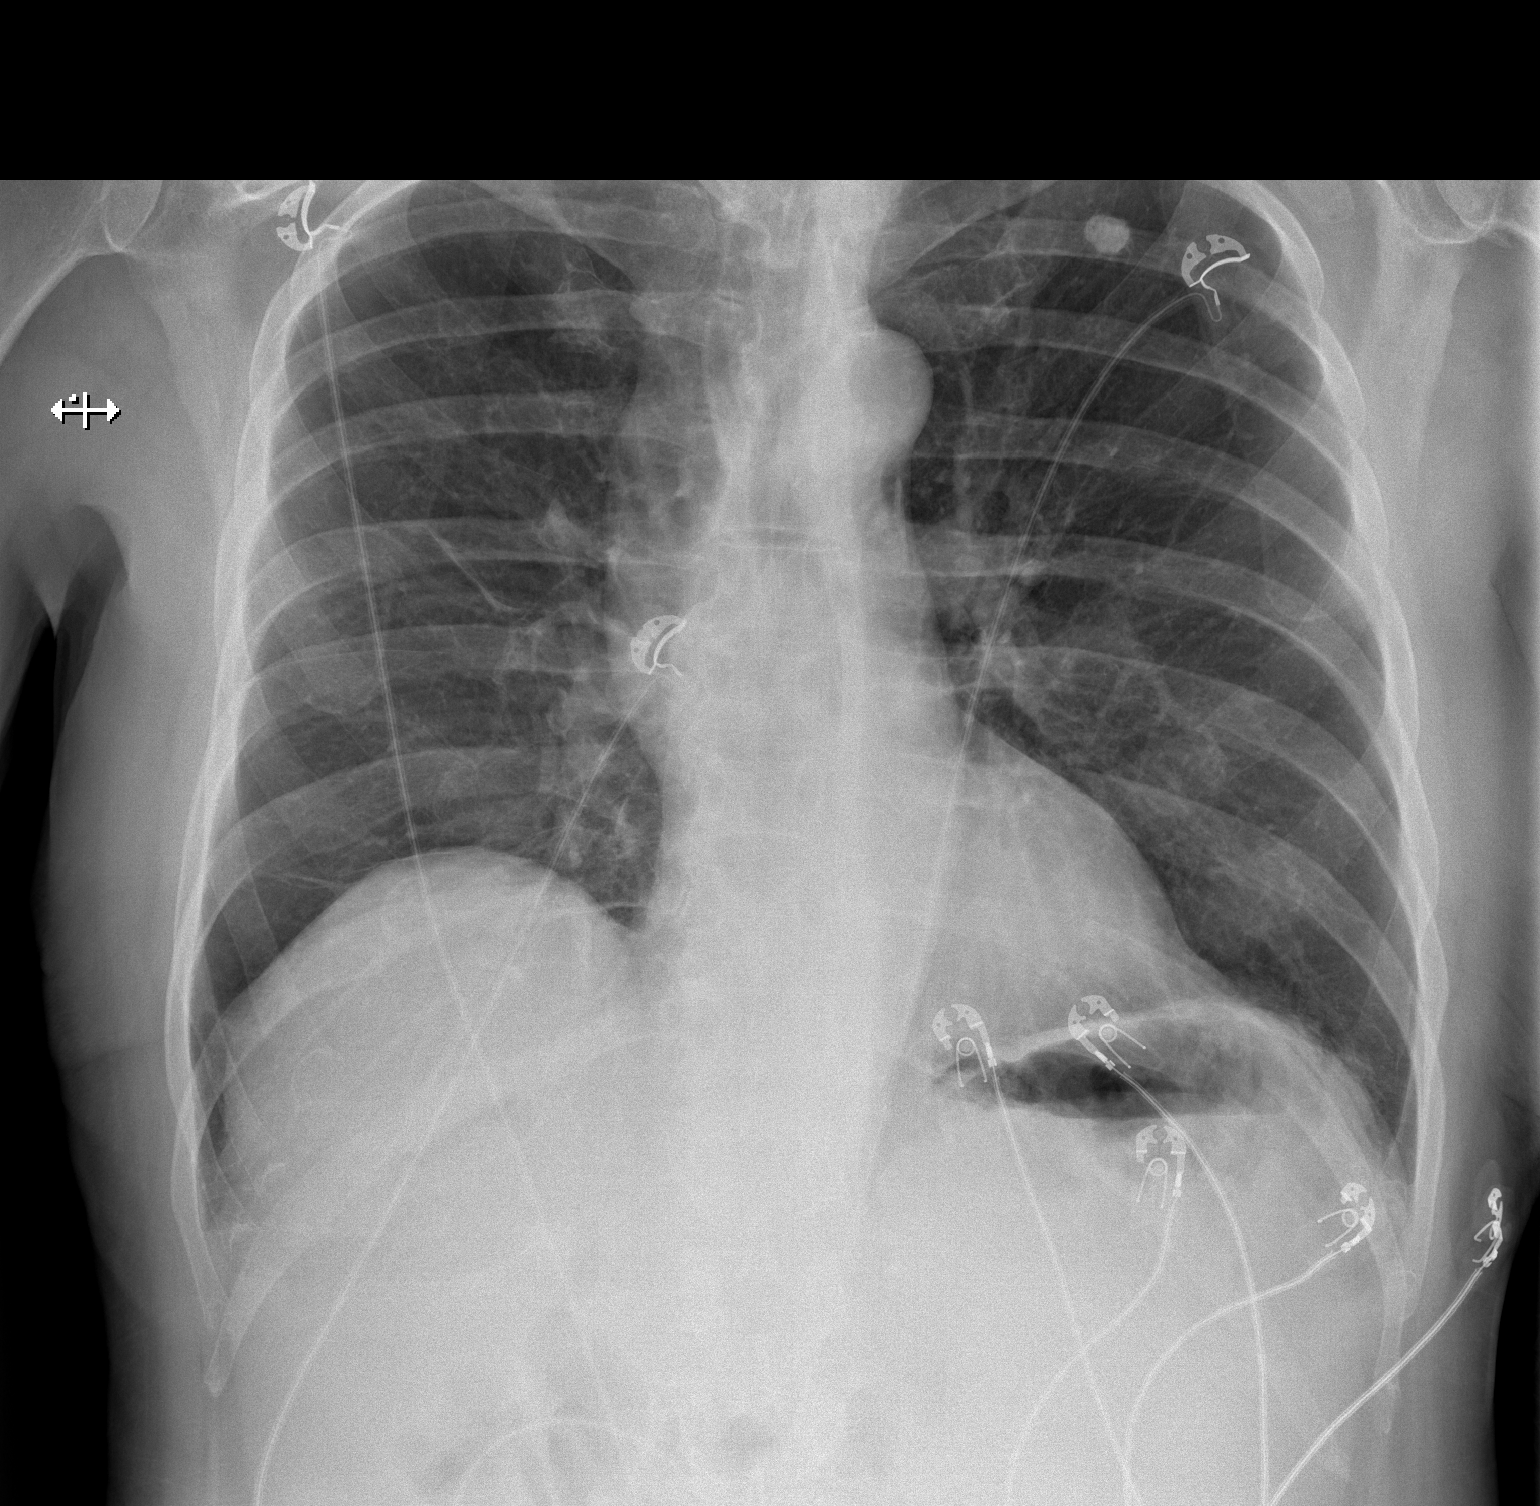

[w chest lat]
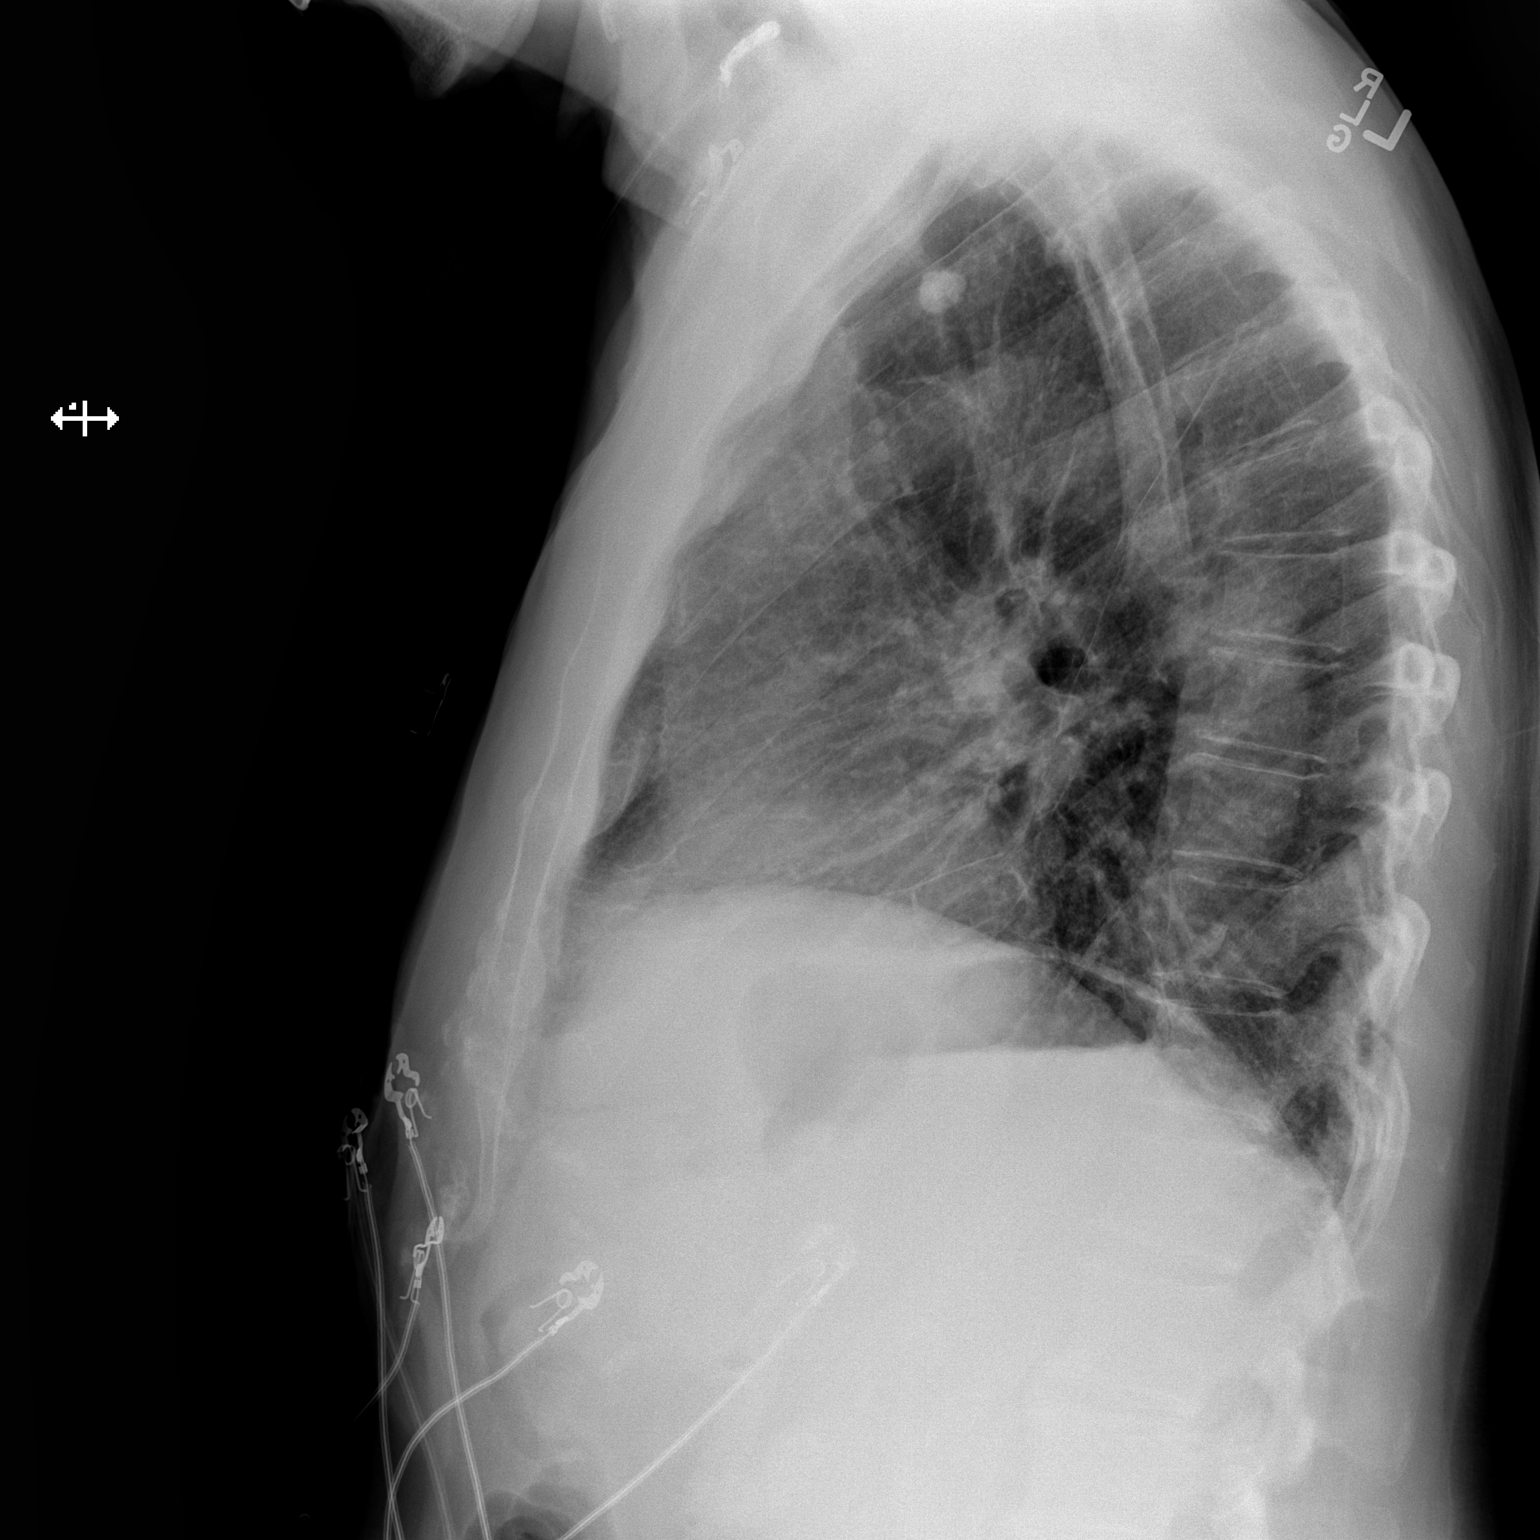

[2 of 2 positions shown; findings below may reference images not displayed]

FINDINGS: The cardiac silhouette, mediastinal and hilar contours are normal.
The lungs are clear of an acute process. There is mild eventration
of the right hemidiaphragm. A large calcified granuloma is noted in
the left upper lobe. The bony thorax is intact.
IMPRESSION: No acute cardiopulmonary findings.

## 2018-12-05 IMAGING — CT CT ANGIO CHEST
2 of 6 series · 18 of 36 positions shown · IV contrast (ISOVUE 370)
Comparison: None.

CLINICAL DATA: Central chest pain starting last night, history of
DVT

EXAM:
CT ANGIOGRAPHY CHEST WITH CONTRAST
TECHNIQUE: Multidetector CT imaging of the chest was performed using the
standard protocol during bolus administration of intravenous
contrast. Multiplanar CT image reconstructions and MIPs were
obtained to evaluate the vascular anatomy.
CONTRAST:  100 cc Isovue

[Series 7: thins for pacs · axial · 0.78mm/px · z∈[+1578,+1818]mm · 17 of 267 slices shown]
[im 14/267  lung]
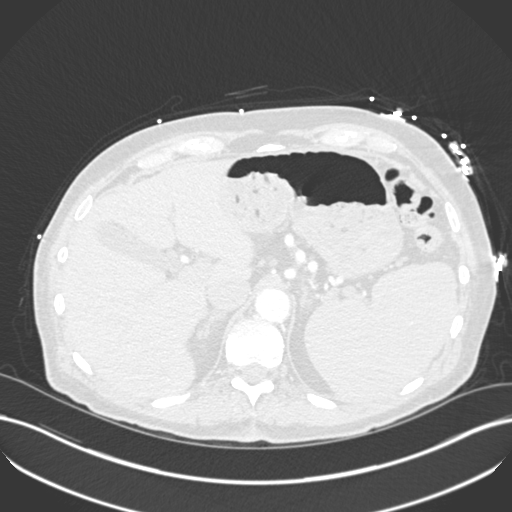
[im 27/267  mediastinal]
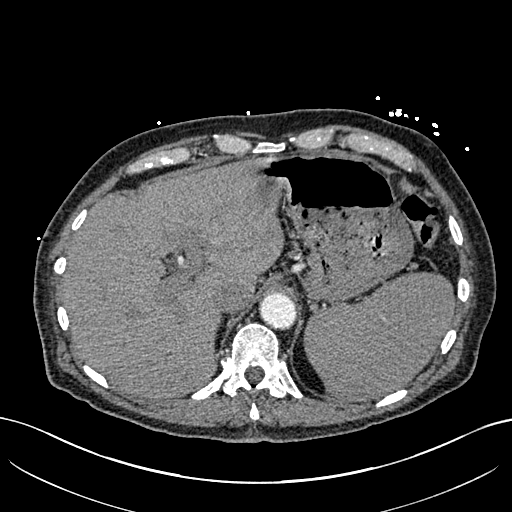
[im 40/267  lung]
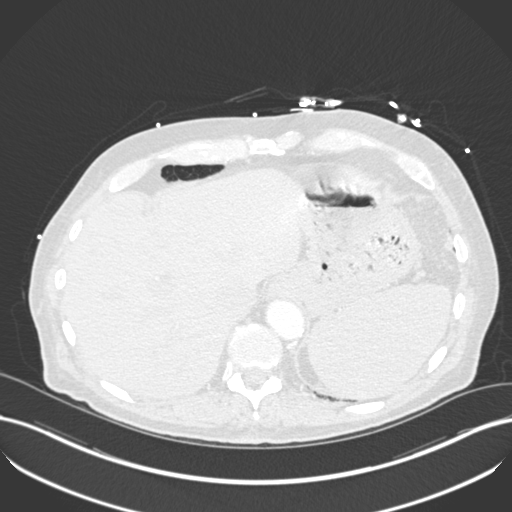
[im 54/267  mediastinal]
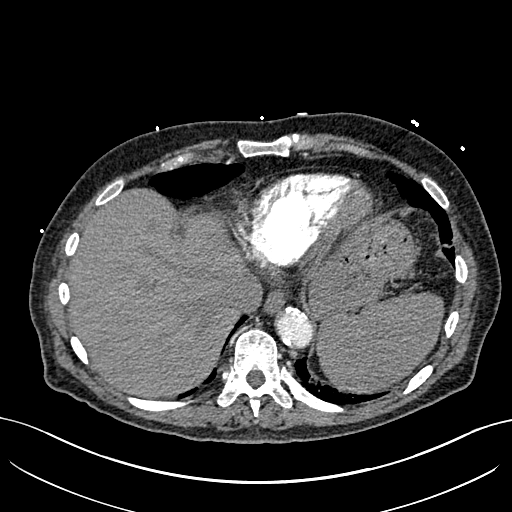
[im 80/267  lung]
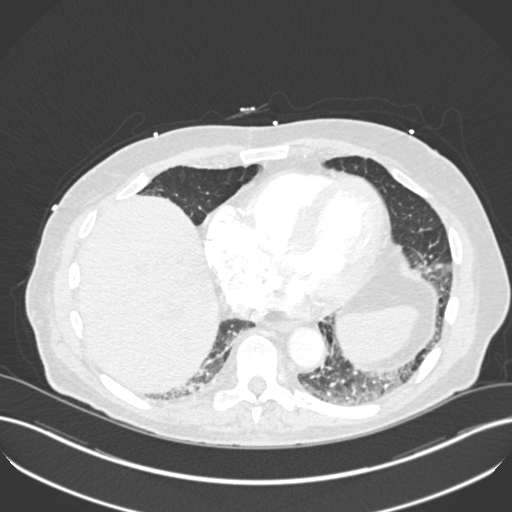
[im 94/267  mediastinal]
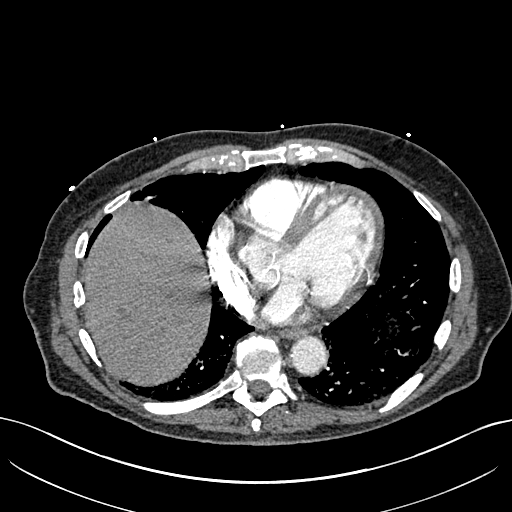
[im 107/267  lung]
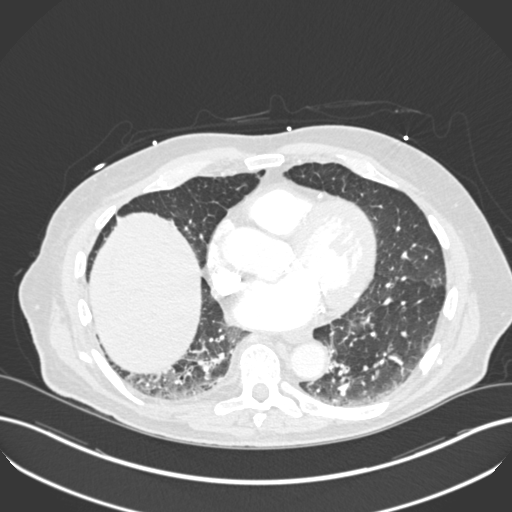
[im 120/267  mediastinal]
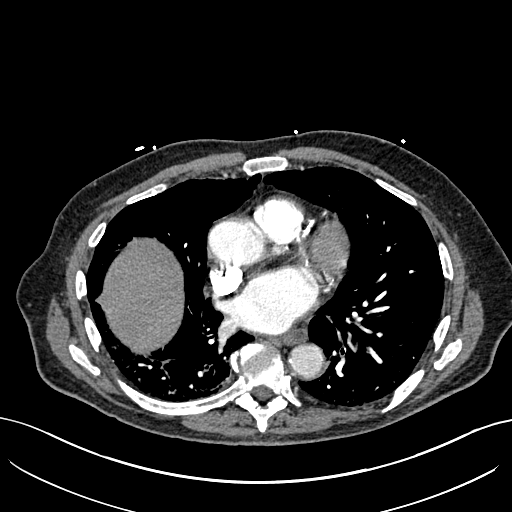
[im 134/267  lung]
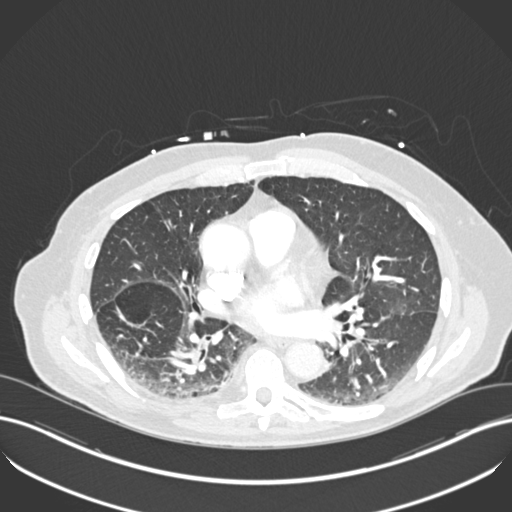
[im 147/267  mediastinal]
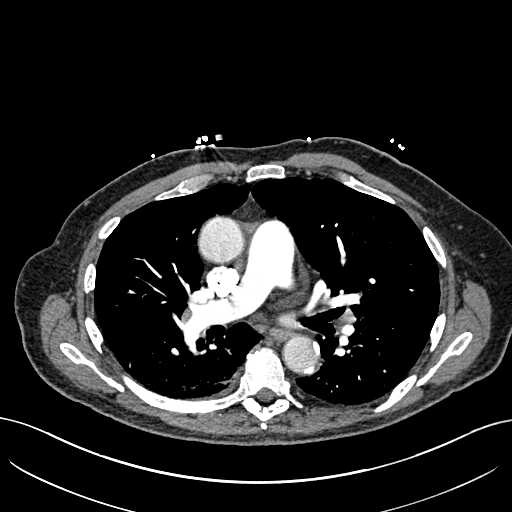
[im 160/267  lung]
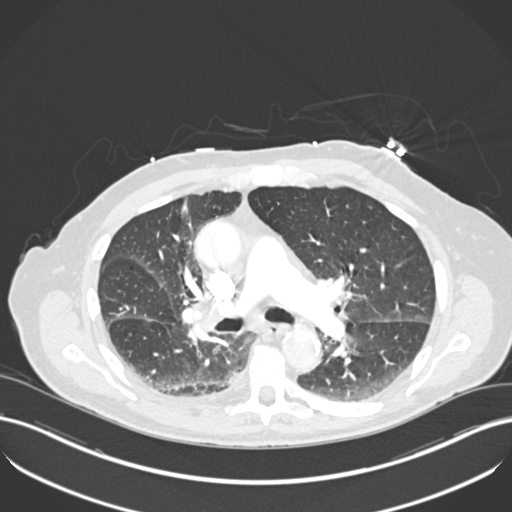
[im 173/267  mediastinal]
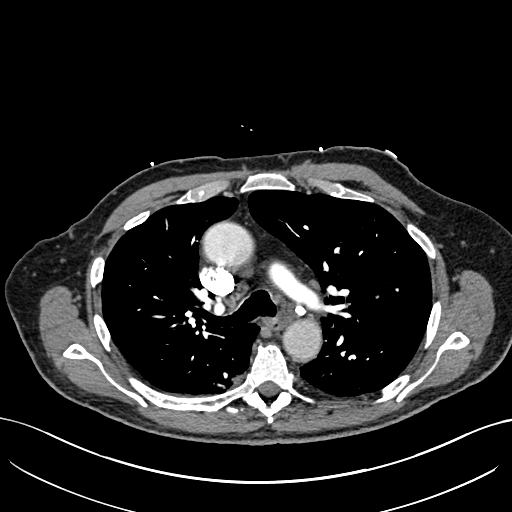
[im 187/267  lung]
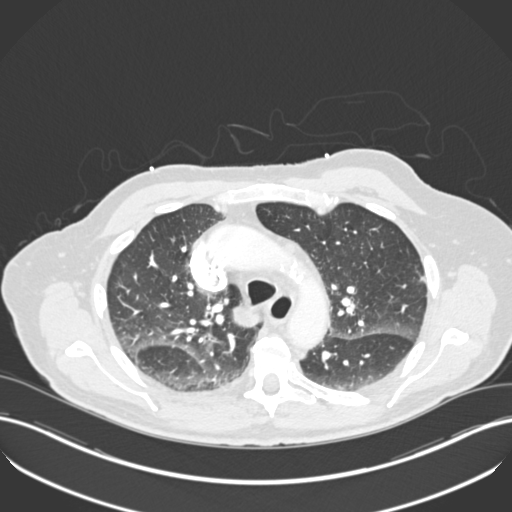
[im 213/267  mediastinal]
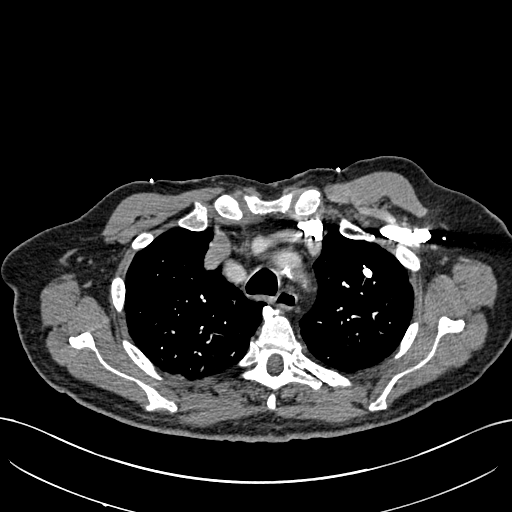
[im 227/267  lung]
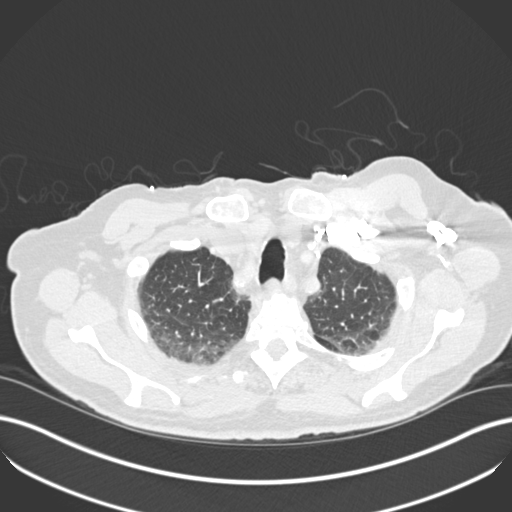
[im 240/267  mediastinal]
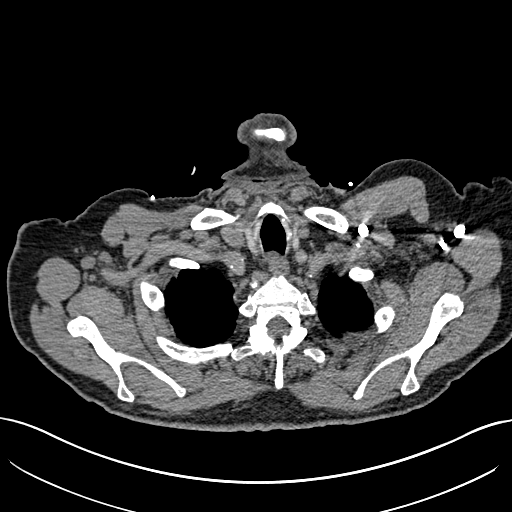
[im 253/267  lung]
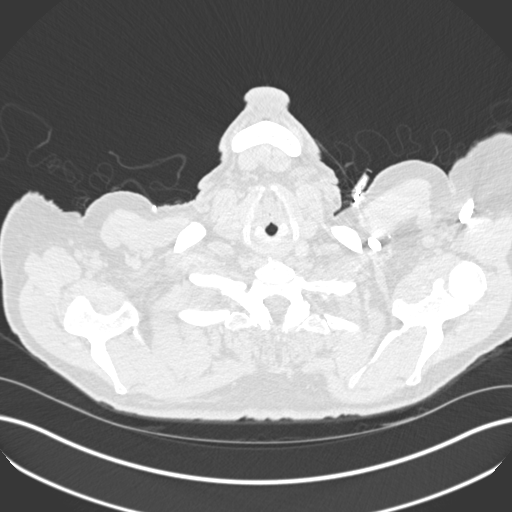

[Series 9: coronal mpr · coronal · 0.52mm/px · 1 of 128 slices shown]
[im 64/128  mediastinal]
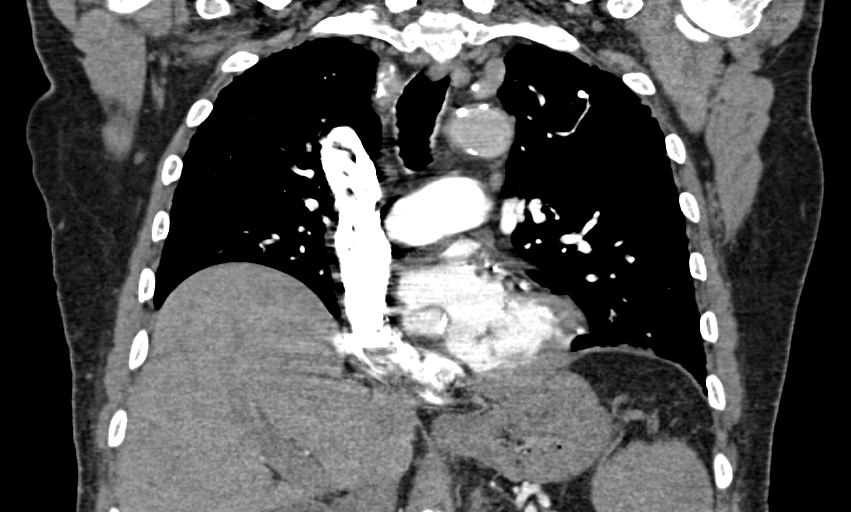

[18 of 36 positions shown; findings below may reference images not displayed]

FINDINGS: Cardiovascular: Atherosclerotic calcifications of thoracic aorta. No
aortic aneurysm. The study is of excellent technical quality. No
pulmonary embolus is noted. Atherosclerotic calcifications of
coronary arteries.

Mediastinum/Nodes: Precarinal lymph node Measures 1 cm short-axis.
AP window lymph node Measures 1.1 cm short-axis. Right hilar lymph
node measures 1 cm short-axis. Left hilar lymph node measures 1 cm
short-axis. These are borderline enlarged by size criteria. Central
airways are patent.

Lungs/Pleura: Images of the lung parenchyma shows no acute
infiltrate or pulmonary edema. Dependent atelectasis noted bilateral
lower lobe posteriorly. There is a calcified granuloma in left upper
lobe anteriorly measures 1.3 cm. No pleural plaques or pleural
thickening. No fibrotic changes. No pneumothorax.

Upper Abdomen: There is partially visualized probable significant
splenomegaly. Further evaluation is recommended. Elevation of the
right hemidiaphragm is noted. No focal hepatic mass. No calcified
gallstones are noted within gallbladder. No adrenal gland mass.

Musculoskeletal: No destructive bony lesions are noted. Sagittal
images of the spine shows mild degenerative changes upper and mid
thoracic spine. Sagittal view of the sternum is unremarkable.

Review of the MIP images confirms the above findings.
IMPRESSION: 1. No pulmonary embolus is noted.
2. No acute infiltrate or pulmonary edema. Mild dependent
atelectasis bilateral lower lobe posteriorly. Calcified granuloma in
left upper lobe anteriorly measures 1.3 cm.
3. There is partially visualized probable significant splenomegaly.
Further evaluation is recommended.
4. Borderline mediastinal and hilar adenopathy might be reactive.
Clinical correlation is necessary.

## 2018-12-20 ENCOUNTER — Inpatient Hospital Stay: Payer: Medicare Other

## 2018-12-20 ENCOUNTER — Other Ambulatory Visit: Payer: Self-pay

## 2018-12-20 ENCOUNTER — Inpatient Hospital Stay: Payer: Medicare Other | Attending: Hematology

## 2018-12-20 DIAGNOSIS — D45 Polycythemia vera: Secondary | ICD-10-CM | POA: Diagnosis not present

## 2018-12-20 DIAGNOSIS — D6959 Other secondary thrombocytopenia: Secondary | ICD-10-CM | POA: Insufficient documentation

## 2018-12-20 LAB — CBC WITH DIFFERENTIAL/PLATELET
Abs Immature Granulocytes: 0.18 10*3/uL — ABNORMAL HIGH (ref 0.00–0.07)
Basophils Absolute: 0.2 10*3/uL — ABNORMAL HIGH (ref 0.0–0.1)
Basophils Relative: 2 %
Eosinophils Absolute: 0.1 10*3/uL (ref 0.0–0.5)
Eosinophils Relative: 1 %
HCT: 47.3 % (ref 39.0–52.0)
Hemoglobin: 14.7 g/dL (ref 13.0–17.0)
Immature Granulocytes: 1 %
Lymphocytes Relative: 8 %
Lymphs Abs: 1.3 10*3/uL (ref 0.7–4.0)
MCH: 33 pg (ref 26.0–34.0)
MCHC: 31.1 g/dL (ref 30.0–36.0)
MCV: 106.1 fL — ABNORMAL HIGH (ref 80.0–100.0)
Monocytes Absolute: 0.6 10*3/uL (ref 0.1–1.0)
Monocytes Relative: 4 %
Neutro Abs: 13.1 10*3/uL — ABNORMAL HIGH (ref 1.7–7.7)
Neutrophils Relative %: 84 %
Platelets: 93 10*3/uL — ABNORMAL LOW (ref 150–400)
RBC: 4.46 MIL/uL (ref 4.22–5.81)
RDW: 14.6 % (ref 11.5–15.5)
WBC: 15.5 10*3/uL — ABNORMAL HIGH (ref 4.0–10.5)
nRBC: 0 % (ref 0.0–0.2)

## 2018-12-20 NOTE — Patient Instructions (Signed)

## 2019-01-08 DIAGNOSIS — I10 Essential (primary) hypertension: Secondary | ICD-10-CM | POA: Diagnosis not present

## 2019-01-08 DIAGNOSIS — M109 Gout, unspecified: Secondary | ICD-10-CM | POA: Diagnosis not present

## 2019-01-08 DIAGNOSIS — E7849 Other hyperlipidemia: Secondary | ICD-10-CM | POA: Diagnosis not present

## 2019-01-08 DIAGNOSIS — Z125 Encounter for screening for malignant neoplasm of prostate: Secondary | ICD-10-CM | POA: Diagnosis not present

## 2019-01-15 DIAGNOSIS — R7302 Impaired glucose tolerance (oral): Secondary | ICD-10-CM | POA: Diagnosis not present

## 2019-01-15 DIAGNOSIS — Z0000 Encounter for general adult medical examination without abnormal findings: Secondary | ICD-10-CM | POA: Diagnosis not present

## 2019-01-15 DIAGNOSIS — I8290 Acute embolism and thrombosis of unspecified vein: Secondary | ICD-10-CM | POA: Diagnosis not present

## 2019-01-15 DIAGNOSIS — N183 Chronic kidney disease, stage 3 (moderate): Secondary | ICD-10-CM | POA: Diagnosis not present

## 2019-01-15 DIAGNOSIS — E785 Hyperlipidemia, unspecified: Secondary | ICD-10-CM | POA: Diagnosis not present

## 2019-01-15 DIAGNOSIS — Z1331 Encounter for screening for depression: Secondary | ICD-10-CM | POA: Diagnosis not present

## 2019-01-15 DIAGNOSIS — I129 Hypertensive chronic kidney disease with stage 1 through stage 4 chronic kidney disease, or unspecified chronic kidney disease: Secondary | ICD-10-CM | POA: Diagnosis not present

## 2019-01-15 DIAGNOSIS — I350 Nonrheumatic aortic (valve) stenosis: Secondary | ICD-10-CM | POA: Diagnosis not present

## 2019-01-15 DIAGNOSIS — D45 Polycythemia vera: Secondary | ICD-10-CM | POA: Diagnosis not present

## 2019-01-15 DIAGNOSIS — M109 Gout, unspecified: Secondary | ICD-10-CM | POA: Diagnosis not present

## 2019-01-15 DIAGNOSIS — F329 Major depressive disorder, single episode, unspecified: Secondary | ICD-10-CM | POA: Diagnosis not present

## 2019-01-15 DIAGNOSIS — M1990 Unspecified osteoarthritis, unspecified site: Secondary | ICD-10-CM | POA: Diagnosis not present

## 2019-01-15 DIAGNOSIS — I720 Aneurysm of carotid artery: Secondary | ICD-10-CM | POA: Diagnosis not present

## 2019-01-20 ENCOUNTER — Telehealth: Payer: Self-pay | Admitting: Hematology

## 2019-01-20 NOTE — Telephone Encounter (Signed)
Scheduled appt per 7/06 sch message - pt aware of appt date and time

## 2019-01-24 ENCOUNTER — Inpatient Hospital Stay: Payer: Medicare Other | Attending: Hematology

## 2019-01-24 ENCOUNTER — Other Ambulatory Visit: Payer: Self-pay

## 2019-01-24 VITALS — BP 99/47 | HR 90 | Temp 98.1°F | Resp 18

## 2019-01-24 DIAGNOSIS — D45 Polycythemia vera: Secondary | ICD-10-CM | POA: Insufficient documentation

## 2019-02-21 ENCOUNTER — Other Ambulatory Visit: Payer: Medicare Other

## 2019-02-21 ENCOUNTER — Ambulatory Visit: Payer: Medicare Other | Admitting: Hematology

## 2019-03-17 NOTE — Progress Notes (Signed)
[place]   [phone] [phone]   Clinic Follow up Note   Patient Care Team: [name], MD as PCP - General (Internal Medicine)  Date of Service:  03/20/2019  CHIEF COMPLAINT: Follow up for Polycythemia Vera  CURRENT THERAPY:  1. Phlebotomy as needed if HCT>47%.  2. Hydrea decreased to 500mg  daily 4-5 days/week since 02/2015; increased to take hydrea 6 days/week starting 02/08/17 (hold on Mondays). Increase to Hydrea 500mg  BID on Mondays and 500mg  once daily for the rest of week starting 12/13/17. Due to thrombocytopenia we decreased him to 500mg  5 days/week on 01/28/18, hold Mondays and thursdays. Returned to [employer] 500mg  daily in Jan 2020 3. Xarelto 20 mg started about mid-February 2015 and off after 6 month, restarted again in 02/2015 due to left LE DVT  INTERVAL HISTORY:  [name] [name] is here for a follow up [place]. He was last seen by me 7 months ago. He presents to the clinic alone. He notes he is doing well and stable. He notes no new changes since last visit. He tolerated last phlebotomy well. He notes his mobility is limited but he still gets up and walks to prevent blood clot. He still drives. He is taking hydrea once a day and tolerating well. He is also on Xarelto, no sign of bleeding but still has bruising. He denies any falls.    REVIEW OF SYSTEMS:   Constitutional: Denies fevers, chills or abnormal weight loss Eyes: Denies blurriness of vision Ears, nose, mouth, throat, and face: Denies mucositis or sore throat Respiratory: Denies cough, dyspnea or wheezes Cardiovascular: Denies palpitation, chest discomfort or lower extremity swelling Gastrointestinal:  Denies nausea, heartburn or change in bowel habits Skin: Denies abnormal skin rashes Lymphatics: Denies new lymphadenopathy (+) easy bruising  Neurological:Denies numbness, tingling or new weaknesses Behavioral/Psych: Mood is stable, no new changes  All other  systems were reviewed with the patient and are negative.  MEDICAL HISTORY:  Past Medical History:  Diagnosis Date  . Bone marrow disease   . BPH (benign prostatic hypertrophy)   . Depression   . DJD (degenerative joint disease)   . Dyslipidemia   . Gout   . HTN (hypertension)   . Mild aortic stenosis   . Polycythemia vera(238.4)     SURGICAL HISTORY: Past Surgical History:  Procedure Laterality Date  . ENDOVENOUS ABLATION SAPHENOUS VEIN W/ LASER Left 09/04/2016   endovenous laser ablation left greater saphenous vein by Tinnie Gens MD  . HEMORROIDECTOMY      I have reviewed the social history and family history with the patient and they are unchanged from previous note.  ALLERGIES:  is allergic to amoxicillin and colchicine.  MEDICATIONS:  Current Outpatient Medications  Medication Sig Dispense Refill  . acetaminophen (TYLENOL) 500 MG tablet Take 500 mg by mouth every 6 (six) hours as needed.    Marland Kitchen allopurinol (ZYLOPRIM) 100 MG tablet Take 100 mg by mouth 2 (two) times daily.     Marland Kitchen ALPRAZolam (XANAX) 0.5 MG tablet Take 0.5 mg by mouth daily as needed for anxiety.     Marland Kitchen escitalopram (LEXAPRO) 10 MG tablet Take 5 mg by mouth at bedtime.     Marland Kitchen glucosamine-chondroitin 500-400 MG tablet Take 1 tablet by mouth daily.    . hydroxyurea (HYDREA) 500 MG capsule 1 tab daily in the morning except 1 tab twice daily on Mondays (Patient taking differently: 500 mg daily. ) 40 capsule 0  . lisinopril (PRINIVIL,ZESTRIL) 10 MG  tablet Take 10 mg by mouth daily.    . Multiple Vitamin (MULTIVITAMIN WITH MINERALS) TABS tablet Take 1 tablet by mouth daily.    Marland Kitchen omega-3 acid ethyl esters (LOVAZA) 1 g capsule Take 1 g by mouth daily.    . rivaroxaban (XARELTO) 20 MG TABS tablet Take 20 mg by mouth daily with supper.    . terazosin (HYTRIN) 5 MG capsule Take 5 mg by mouth at bedtime.    . vitamin E (VITAMIN E) 400 UNIT capsule Take 400 Units by mouth daily.     No current facility-administered  medications for this visit.     PHYSICAL EXAMINATION: ECOG PERFORMANCE STATUS: 2 - Symptomatic, <50% confined to bed  Vitals:   03/20/19 1053  BP: 135/84  Pulse: 81  Resp: 18  Temp: 98.7 F (37.1 C)  SpO2: 96%   Filed Weights   03/20/19 1053  Weight: 173 lb 4.8 oz (78.6 kg)    GENERAL:alert, no distress and comfortable SKIN: skin color, texture, turgor are normal, no rashes or significant lesions (+) Skin hyperpigmentation above and below ankle.  EYES: normal, Conjunctiva are pink and non-injected, sclera clear  Musculoskeletal:no cyanosis of digits and no clubbing, no leg edema NEURO: alert & oriented x 3 with fluent speech, no focal motor/sensory deficits  LABORATORY DATA:  I have reviewed the data as listed CBC Latest Ref Rng & Units 03/20/2019 12/20/2018 10/18/2018  WBC 4.0 - 10.5 K/uL 15.4(H) 15.5(H) 16.5(H)  Hemoglobin 13.0 - 17.0 g/dL 14.8 14.7 15.1  Hematocrit 39.0 - 52.0 % 46.6 47.3 49.1  Platelets 150 - 400 K/uL 105(L) 93(L) 124(L)     CMP Latest Ref Rng & Units 10/18/2018 07/22/2018 04/22/2018  Glucose 70 - 99 mg/dL 113(H) 94 110(H)  BUN 8 - 23 mg/dL 25(H) 26(H) 21  Creatinine 0.61 - 1.24 mg/dL 1.39(H) 1.43(H) 1.32(H)  Sodium 135 - 145 mmol/L 141 141 139  Potassium 3.5 - 5.1 mmol/L 4.6 4.7 4.9  Chloride 98 - 111 mmol/L 104 105 103  CO2 22 - 32 mmol/L 28 29 30   Calcium 8.9 - 10.3 mg/dL 9.0 9.0 9.2  Total Protein 6.5 - 8.1 g/dL 6.0(L) 6.2(L) 6.1(L)  Total Bilirubin 0.3 - 1.2 mg/dL 0.6 0.6 0.7  Alkaline Phos 38 - 126 U/L 80 81 86  AST 15 - 41 U/L 20 21 19   ALT 0 - 44 U/L 18 16 14       RADIOGRAPHIC STUDIES: I have personally reviewed the radiological images as listed and agreed with the findings in the report. No results found.   ASSESSMENT & PLAN:  [name] [name] is a 85 y.o. male with   1. Polycythemia vera diagnosed in 1997, JAK2 mutation positive.  -We previously reviewed the nature history of PV, most people do very well, some people would develop  myelofibrosis or leukemia late on. The major complication is thrombosis. -He has been on Hydrea 500 mg daily for 4-5 years with dose adjustment as needed. He is currently on Hydrea 500mg  daily   -He is on Xarelto for previous multiple DVTs. Thrombocytopenia certainly increase his risk of bleeding. -We will continue phlebotomy if hematocrit more than 47%(instead of 45% due to his advanced age), he has required phlebotomy every 2 to 3 months, tolerating well. -He is clinically stable. Labs reviewed, CBC and CMP WNL except WBC 15.4, HCT 46.6%, PLT 105K, ANC 12.6. No phlebotomy today  -He has been taking Hydrea daily and tolerates well, mild thrombocytopenia is stable, he will continue once  daily.  -Continue lab and phlebotomy if hematocrit more than 47% every 2 months -F/u in6 months   2. RLE DVT, LLE DVT in 02/2015, likely secondary to #1 -I recommend him to continue Xarelto indefinitely, due to recurrent DVT. -He knows to be careful about fall and injury -He had vascular surgery on his left leg on 09/04/16 for poor circulation. Laser ablation of the left great saphenous vein from the proximal calf to near the saphenofemoral junction performed under local tumescent anesthesia by Dr. Kellie Simmering. -He knows to elevate his legs when he sits and to avoid falls due to risk of bleeding.  -Continue Xarelto. No signs of bleeding but has stable bruising. I suggest Vitamin C daily to help with this.   3. AS, dyspnea and fatigue -pt complains slightly worsening fatigue and dyspnea on exertion since his acute gastritis in 05/2017  -he has mild AS on echo in 2011, exampreviouslyshowed significant systolic murmur at aortic area, suspect from AS  -overall improved, f/u with PCP  -fatigue stable, still remains active adequately.  4. Thrombocytopenia, secondary to hydrea  -Hydrea has been dose adjusted as needed.  -Goal PLT above 70-80K.  -Plt stable at 105K today (03/20/19)  PLAN: -Labs reviewed, HCT  46.6 today, no phlebotomy today  -Continue hydrea 500mg  daily  -Lab in 2, 4 and 6 months with phlebotomy if HCT>47%  -F/u in 6 months    No problem-specific Assessment & Plan notes found for this encounter.   No orders of the defined types were placed in this encounter.  All questions were answered. The patient knows to call the clinic with any problems, questions or concerns. No barriers to learning was detected. I spent 10 minutes counseling the patient face to face. The total time spent in the appointment was 15 minutes and more than 50% was on counseling and review of test results     Truitt Merle, MD 03/20/2019   I, Joslyn Devon, am acting as scribe for Truitt Merle, MD.   I have reviewed the above documentation for accuracy and completeness, and I agree with the above.

## 2019-03-20 ENCOUNTER — Inpatient Hospital Stay (HOSPITAL_BASED_OUTPATIENT_CLINIC_OR_DEPARTMENT_OTHER): Payer: Medicare Other | Admitting: Hematology

## 2019-03-20 ENCOUNTER — Inpatient Hospital Stay: Payer: Medicare Other | Attending: Hematology

## 2019-03-20 ENCOUNTER — Other Ambulatory Visit: Payer: Self-pay

## 2019-03-20 ENCOUNTER — Inpatient Hospital Stay: Payer: Medicare Other

## 2019-03-20 ENCOUNTER — Encounter: Payer: Self-pay | Admitting: Hematology

## 2019-03-20 ENCOUNTER — Telehealth: Payer: Self-pay | Admitting: Hematology

## 2019-03-20 VITALS — BP 135/84 | HR 81 | Temp 98.7°F | Resp 18 | Ht 71.0 in | Wt 173.3 lb

## 2019-03-20 DIAGNOSIS — Z86718 Personal history of other venous thrombosis and embolism: Secondary | ICD-10-CM | POA: Diagnosis not present

## 2019-03-20 DIAGNOSIS — D45 Polycythemia vera: Secondary | ICD-10-CM

## 2019-03-20 DIAGNOSIS — D6959 Other secondary thrombocytopenia: Secondary | ICD-10-CM | POA: Insufficient documentation

## 2019-03-20 DIAGNOSIS — Z7901 Long term (current) use of anticoagulants: Secondary | ICD-10-CM | POA: Diagnosis not present

## 2019-03-20 LAB — CBC WITH DIFFERENTIAL/PLATELET
Abs Immature Granulocytes: 0.13 10*3/uL — ABNORMAL HIGH (ref 0.00–0.07)
Basophils Absolute: 0.3 10*3/uL — ABNORMAL HIGH (ref 0.0–0.1)
Basophils Relative: 2 %
Eosinophils Absolute: 0.1 10*3/uL (ref 0.0–0.5)
Eosinophils Relative: 1 %
HCT: 46.6 % (ref 39.0–52.0)
Hemoglobin: 14.8 g/dL (ref 13.0–17.0)
Immature Granulocytes: 1 %
Lymphocytes Relative: 9 %
Lymphs Abs: 1.4 10*3/uL (ref 0.7–4.0)
MCH: 33.7 pg (ref 26.0–34.0)
MCHC: 31.8 g/dL (ref 30.0–36.0)
MCV: 106.2 fL — ABNORMAL HIGH (ref 80.0–100.0)
Monocytes Absolute: 0.9 10*3/uL (ref 0.1–1.0)
Monocytes Relative: 6 %
Neutro Abs: 12.6 10*3/uL — ABNORMAL HIGH (ref 1.7–7.7)
Neutrophils Relative %: 81 %
Platelets: 105 10*3/uL — ABNORMAL LOW (ref 150–400)
RBC: 4.39 MIL/uL (ref 4.22–5.81)
RDW: 14.2 % (ref 11.5–15.5)
WBC: 15.4 10*3/uL — ABNORMAL HIGH (ref 4.0–10.5)
nRBC: 0 % (ref 0.0–0.2)

## 2019-03-20 LAB — COMPREHENSIVE METABOLIC PANEL
ALT: 15 U/L (ref 0–44)
AST: 21 U/L (ref 15–41)
Albumin: 3.8 g/dL (ref 3.5–5.0)
Alkaline Phosphatase: 81 U/L (ref 38–126)
Anion gap: 7 (ref 5–15)
BUN: 27 mg/dL — ABNORMAL HIGH (ref 8–23)
CO2: 26 mmol/L (ref 22–32)
Calcium: 8.7 mg/dL — ABNORMAL LOW (ref 8.9–10.3)
Chloride: 102 mmol/L (ref 98–111)
Creatinine, Ser: 1.37 mg/dL — ABNORMAL HIGH (ref 0.61–1.24)
GFR calc Af Amer: 52 mL/min — ABNORMAL LOW (ref >60)
GFR calc non Af Amer: 44 mL/min — ABNORMAL LOW (ref >60)
Glucose, Bld: 96 mg/dL (ref 70–99)
Potassium: 5.1 mmol/L (ref 3.5–5.1)
Sodium: 135 mmol/L (ref 135–145)
Total Bilirubin: 0.6 mg/dL (ref 0.3–1.2)
Total Protein: 6 g/dL — ABNORMAL LOW (ref 6.5–8.1)

## 2019-03-20 NOTE — Telephone Encounter (Signed)
Gave avs and calendar ° °

## 2019-05-19 ENCOUNTER — Telehealth: Payer: Self-pay | Admitting: Hematology

## 2019-05-19 ENCOUNTER — Telehealth: Payer: Self-pay

## 2019-05-19 NOTE — Telephone Encounter (Signed)
R/s apt per 11/2 sch message- pt is aware of new appt date and time

## 2019-05-19 NOTE — Telephone Encounter (Signed)
Spoke with patient regarding his appointments tomorrow 11/3, per infusion was wanting to change his times. He is unable to come in the afternoon for an appointment as he has help with his wife in the mornings (she is wheelchair bound).  I will send scheduling a message to cancel his appointments tomorrow and reschedule another day this week for lab and phlebotomy.

## 2019-05-20 ENCOUNTER — Inpatient Hospital Stay: Payer: Medicare Other

## 2019-05-22 ENCOUNTER — Inpatient Hospital Stay: Payer: Medicare Other

## 2019-05-22 ENCOUNTER — Other Ambulatory Visit: Payer: Self-pay

## 2019-05-22 ENCOUNTER — Inpatient Hospital Stay: Payer: Medicare Other | Attending: Hematology

## 2019-05-22 VITALS — BP 130/71 | HR 77 | Temp 97.9°F | Resp 20

## 2019-05-22 DIAGNOSIS — Z23 Encounter for immunization: Secondary | ICD-10-CM

## 2019-05-22 DIAGNOSIS — D45 Polycythemia vera: Secondary | ICD-10-CM | POA: Insufficient documentation

## 2019-05-22 LAB — COMPREHENSIVE METABOLIC PANEL
ALT: 16 U/L (ref 0–44)
AST: 22 U/L (ref 15–41)
Albumin: 3.9 g/dL (ref 3.5–5.0)
Alkaline Phosphatase: 77 U/L (ref 38–126)
Anion gap: 10 (ref 5–15)
BUN: 20 mg/dL (ref 8–23)
CO2: 24 mmol/L (ref 22–32)
Calcium: 8.9 mg/dL (ref 8.9–10.3)
Chloride: 104 mmol/L (ref 98–111)
Creatinine, Ser: 1.3 mg/dL — ABNORMAL HIGH (ref 0.61–1.24)
GFR calc Af Amer: 55 mL/min — ABNORMAL LOW (ref >60)
GFR calc non Af Amer: 47 mL/min — ABNORMAL LOW (ref >60)
Glucose, Bld: 123 mg/dL — ABNORMAL HIGH (ref 70–99)
Potassium: 4.7 mmol/L (ref 3.5–5.1)
Sodium: 138 mmol/L (ref 135–145)
Total Bilirubin: 0.7 mg/dL (ref 0.3–1.2)
Total Protein: 6.1 g/dL — ABNORMAL LOW (ref 6.5–8.1)

## 2019-05-22 LAB — CBC WITH DIFFERENTIAL/PLATELET
Abs Immature Granulocytes: 0.18 10*3/uL — ABNORMAL HIGH (ref 0.00–0.07)
Basophils Absolute: 0.2 10*3/uL — ABNORMAL HIGH (ref 0.0–0.1)
Basophils Relative: 1 %
Eosinophils Absolute: 0.1 10*3/uL (ref 0.0–0.5)
Eosinophils Relative: 1 %
HCT: 50.1 % (ref 39.0–52.0)
Hemoglobin: 16.1 g/dL (ref 13.0–17.0)
Immature Granulocytes: 1 %
Lymphocytes Relative: 7 %
Lymphs Abs: 1.2 10*3/uL (ref 0.7–4.0)
MCH: 34.8 pg — ABNORMAL HIGH (ref 26.0–34.0)
MCHC: 32.1 g/dL (ref 30.0–36.0)
MCV: 108.2 fL — ABNORMAL HIGH (ref 80.0–100.0)
Monocytes Absolute: 0.8 10*3/uL (ref 0.1–1.0)
Monocytes Relative: 5 %
Neutro Abs: 14.3 10*3/uL — ABNORMAL HIGH (ref 1.7–7.7)
Neutrophils Relative %: 85 %
Platelets: 106 10*3/uL — ABNORMAL LOW (ref 150–400)
RBC: 4.63 MIL/uL (ref 4.22–5.81)
RDW: 14.6 % (ref 11.5–15.5)
WBC: 16.7 10*3/uL — ABNORMAL HIGH (ref 4.0–10.5)
nRBC: 0 % (ref 0.0–0.2)

## 2019-05-22 MED ORDER — INFLUENZA VAC A&B SA ADJ QUAD 0.5 ML IM PRSY
PREFILLED_SYRINGE | INTRAMUSCULAR | Status: AC
  Filled 2019-05-22: qty 0.5

## 2019-05-22 MED ORDER — INFLUENZA VAC A&B SA ADJ QUAD 0.5 ML IM PRSY
0.5000 mL | PREFILLED_SYRINGE | Freq: Once | INTRAMUSCULAR | Status: AC
  Administered 2019-05-22: 12:00:00 0.5 mL via INTRAMUSCULAR

## 2019-05-22 NOTE — Patient Instructions (Signed)
Therapeutic Phlebotomy Therapeutic phlebotomy is the planned removal of blood from a person's body for the purpose of treating a medical condition. The procedure is similar to donating blood. Usually, about a pint (470 mL, or 0.47 L) of blood is removed. The average adult has 9-12 pints (4.3-5.7 L) of blood in the body. Therapeutic phlebotomy may be used to treat the following medical conditions:  Hemochromatosis. This is a condition in which the blood contains too much iron.  Polycythemia vera. This is a condition in which the blood contains too many red blood cells.  Porphyria cutanea tarda. This is a disease in which an important part of hemoglobin is not made properly. It results in the buildup of abnormal amounts of porphyrins in the body.  Sickle cell disease. This is a condition in which the red blood cells form an abnormal crescent shape rather than a round shape. Tell a health care provider about:  Any allergies you have.  All medicines you are taking, including vitamins, herbs, eye drops, creams, and over-the-counter medicines.  Any problems you or family members have had with anesthetic medicines.  Any blood disorders you have.  Any surgeries you have had.  Any medical conditions you have.  Whether you are pregnant or may be pregnant. What are the risks? Generally, this is a safe procedure. However, problems may occur, including:  Nausea or light-headedness.  Low blood pressure (hypotension).  Soreness, bleeding, swelling, or bruising at the needle insertion site.  Infection. What happens before the procedure?  Follow instructions from your health care provider about eating or drinking restrictions.  Ask your health care provider about: ? Changing or stopping your regular medicines. This is especially important if you are taking diabetes medicines or blood thinners (anticoagulants). ? Taking medicines such as aspirin and ibuprofen. These medicines can thin your  blood. Do not take these medicines unless your health care provider tells you to take them. ? Taking over-the-counter medicines, vitamins, herbs, and supplements.  Wear clothing with sleeves that can be raised above the elbow.  Plan to have someone take you home from the hospital or clinic.  You may have a blood sample taken.  Your blood pressure, pulse rate, and breathing rate will be measured. What happens during the procedure?   To lower your risk of infection: ? Your health care team will wash or sanitize their hands. ? Your skin will be cleaned with an antiseptic.  You may be given a medicine to numb the area (local anesthetic).  A tourniquet will be placed on your arm.  A needle will be inserted into one of your veins.  Tubing and a collection bag will be attached to that needle.  Blood will flow through the needle and tubing into the collection bag.  The collection bag will be placed lower than your arm to allow gravity to help the flow of blood into the bag.  You may be asked to open and close your hand slowly and continually during the entire collection.  After the specified amount of blood has been removed from your body, the collection bag and tubing will be clamped.  The needle will be removed from your vein.  Pressure will be held on the site of the needle insertion to stop the bleeding.  A bandage (dressing) will be placed over the needle insertion site. The procedure may vary among health care providers and hospitals. What happens after the procedure?  Your blood pressure, pulse rate, and breathing rate will be   measured after the procedure.  You will be encouraged to drink fluids.  Your recovery will be assessed and monitored.  You can return to your normal activities as told by your health care provider. Summary  Therapeutic phlebotomy is the planned removal of blood from a person's body for the purpose of treating a medical condition.  Therapeutic  phlebotomy may be used to treat hemochromatosis, polycythemia vera, porphyria cutanea tarda, or sickle cell disease.  In the procedure, a needle is inserted and about a pint (470 mL, or 0.47 L) of blood is removed. The average adult has 9-12 pints (4.3-5.7 L) of blood in the body.  This is generally a safe procedure, but it can sometimes cause problems such as nausea, light-headedness, or low blood pressure (hypotension). This information is not intended to replace advice given to you by your health care provider. Make sure you discuss any questions you have with your health care provider. Document Released: 12/05/2010 Document Revised: 07/19/2017 Document Reviewed: 07/19/2017 Elsevier Patient Education  2020 [place] (COVID-19) Are you at risk?  Are you at risk for the Coronavirus (COVID-19)?  To be considered HIGH RISK for Coronavirus (COVID-19), you have to meet the following criteria:  . Traveled to [place], [place], [place], [place] or [place]; or in the [place] to [place], [place], [place], or [place]; and have fever, cough, and shortness of breath within the last 2 weeks of travel OR . Been in close contact with a person diagnosed with COVID-19 within the last 2 weeks and have fever, cough, and shortness of breath . IF YOU DO NOT MEET THESE CRITERIA, YOU ARE CONSIDERED LOW RISK FOR COVID-19.  What to do if you are HIGH RISK for COVID-19?  Marland Kitchen If you are having a medical emergency, call 911. . Seek medical care right away. Before you go to a doctor's office, urgent care or emergency department, call ahead and tell them about your recent travel, contact with someone diagnosed with COVID-19, and your symptoms. You should receive instructions from your physician's office regarding next steps of care.  . When you arrive at healthcare provider, tell the healthcare staff immediately you have returned from visiting [place], [place], [place], [place] or [place]; or traveled  in the [place] to [place], [place], [place], or [place]; in the last two weeks or you have been in close contact with a person diagnosed with COVID-19 in the last 2 weeks.   . Tell the health care staff about your symptoms: fever, cough and shortness of breath. . After you have been seen by a medical provider, you will be either: o Tested for (COVID-19) and discharged home on quarantine except to seek medical care if symptoms worsen, and asked to  - Stay home and avoid contact with others until you get your results (4-5 days)  - Avoid travel on public transportation if possible (such as bus, train, or airplane) or o Sent to the Emergency Department by EMS for evaluation, COVID-19 testing, and possible admission depending on your condition and test results.  What to do if you are LOW RISK for COVID-19?  Reduce your risk of any infection by using the same precautions used for avoiding the common cold or flu:  Marland Kitchen Wash your hands often with soap and warm water for at least 20 seconds.  If soap and water are not readily available, use an alcohol-based hand sanitizer with at least 60% alcohol.  . If coughing or sneezing,  cover your mouth and nose by coughing or sneezing into the elbow areas of your shirt or coat, into a tissue or into your sleeve (not your hands). . Avoid shaking hands with others and consider head nods or verbal greetings only. . Avoid touching your eyes, nose, or mouth with unwashed hands.  . Avoid close contact with people who are sick. . Avoid places or events with large numbers of people in one location, like concerts or sporting events. . Carefully consider travel plans you have or are making. . If you are planning any travel outside or inside the [place], visit the CDC's Travelers' Health webpage for the latest health notices. . If you have some symptoms but not all symptoms, continue to monitor at home and seek medical attention if your symptoms worsen. . If you are  having a medical emergency, call 911.   [place] / e-Visit: [email or web address]         [facility]: [facility]: [id number]                   [facility]: [phone]

## 2019-05-22 NOTE — Progress Notes (Signed)
[name] [name] presents today for phlebotomy per MD orders. Phlebotomy procedure started at 11:30 am and ended at 11:40 AM 508 grams removed. Patient observed for 30 minutes after procedure without any incident. Patient tolerated procedure well. IV needle removed intact. Food and drink served.

## 2019-07-24 ENCOUNTER — Inpatient Hospital Stay: Payer: Medicare Other

## 2019-07-24 ENCOUNTER — Other Ambulatory Visit: Payer: Self-pay

## 2019-07-24 ENCOUNTER — Inpatient Hospital Stay: Payer: Medicare Other | Attending: Hematology

## 2019-07-24 VITALS — BP 119/74 | HR 79 | Temp 97.9°F | Resp 18

## 2019-07-24 DIAGNOSIS — D45 Polycythemia vera: Secondary | ICD-10-CM

## 2019-07-24 LAB — CBC WITH DIFFERENTIAL/PLATELET
Abs Immature Granulocytes: 0.23 10*3/uL — ABNORMAL HIGH (ref 0.00–0.07)
Basophils Absolute: 0.3 10*3/uL — ABNORMAL HIGH (ref 0.0–0.1)
Basophils Relative: 2 %
Eosinophils Absolute: 0.1 10*3/uL (ref 0.0–0.5)
Eosinophils Relative: 1 %
HCT: 50.4 % (ref 39.0–52.0)
Hemoglobin: 16.1 g/dL (ref 13.0–17.0)
Immature Granulocytes: 1 %
Lymphocytes Relative: 6 %
Lymphs Abs: 1.2 10*3/uL (ref 0.7–4.0)
MCH: 34.8 pg — ABNORMAL HIGH (ref 26.0–34.0)
MCHC: 31.9 g/dL (ref 30.0–36.0)
MCV: 108.9 fL — ABNORMAL HIGH (ref 80.0–100.0)
Monocytes Absolute: 0.8 10*3/uL (ref 0.1–1.0)
Monocytes Relative: 4 %
Neutro Abs: 16 10*3/uL — ABNORMAL HIGH (ref 1.7–7.7)
Neutrophils Relative %: 86 %
Platelets: 96 10*3/uL — ABNORMAL LOW (ref 150–400)
RBC: 4.63 MIL/uL (ref 4.22–5.81)
RDW: 14.3 % (ref 11.5–15.5)
WBC: 18.6 10*3/uL — ABNORMAL HIGH (ref 4.0–10.5)
nRBC: 0 % (ref 0.0–0.2)

## 2019-07-24 NOTE — Patient Instructions (Signed)

## 2019-07-24 NOTE — Progress Notes (Signed)
[name] [name] presents today for phlebotomy per MD orders. Phlebotomy procedure started at 1030 and ended at 1042 498 grams removed via 16 G to RAC. Flow stopped at 498 grams. Patient observed for 30 minutes after procedure without any incident. Patient tolerated procedure well. Diet and nutrition offered and accepted.

## 2019-07-29 ENCOUNTER — Telehealth: Payer: Self-pay

## 2019-07-29 NOTE — Telephone Encounter (Signed)
Spoke with patient per Dr. Burr Medico.  Informed he that due to his persistent high HCT, Dr. Burr Medico  recommends him to increase Hydrea by 1 tab (500mg ) a week, he is currently on 500mg  daily, change to 500mg  bid on Mondays and continue 500mg  daily for the rest of week.  He verbalized an understanding and was able to repeat my instructions back to me.

## 2019-08-08 DIAGNOSIS — I739 Peripheral vascular disease, unspecified: Secondary | ICD-10-CM | POA: Diagnosis not present

## 2019-08-08 DIAGNOSIS — M109 Gout, unspecified: Secondary | ICD-10-CM | POA: Diagnosis not present

## 2019-08-08 DIAGNOSIS — I87332 Chronic venous hypertension (idiopathic) with ulcer and inflammation of left lower extremity: Secondary | ICD-10-CM | POA: Diagnosis not present

## 2019-08-08 DIAGNOSIS — M25572 Pain in left ankle and joints of left foot: Secondary | ICD-10-CM | POA: Diagnosis not present

## 2019-08-08 DIAGNOSIS — L03116 Cellulitis of left lower limb: Secondary | ICD-10-CM | POA: Diagnosis not present

## 2019-08-08 DIAGNOSIS — I8290 Acute embolism and thrombosis of unspecified vein: Secondary | ICD-10-CM | POA: Diagnosis not present

## 2019-08-11 DIAGNOSIS — I8290 Acute embolism and thrombosis of unspecified vein: Secondary | ICD-10-CM | POA: Diagnosis not present

## 2019-08-11 DIAGNOSIS — I87332 Chronic venous hypertension (idiopathic) with ulcer and inflammation of left lower extremity: Secondary | ICD-10-CM | POA: Diagnosis not present

## 2019-08-11 DIAGNOSIS — M1990 Unspecified osteoarthritis, unspecified site: Secondary | ICD-10-CM | POA: Diagnosis not present

## 2019-08-11 DIAGNOSIS — M109 Gout, unspecified: Secondary | ICD-10-CM | POA: Diagnosis not present

## 2019-08-11 DIAGNOSIS — M25572 Pain in left ankle and joints of left foot: Secondary | ICD-10-CM | POA: Diagnosis not present

## 2019-08-11 DIAGNOSIS — I739 Peripheral vascular disease, unspecified: Secondary | ICD-10-CM | POA: Diagnosis not present

## 2019-08-18 DIAGNOSIS — I87332 Chronic venous hypertension (idiopathic) with ulcer and inflammation of left lower extremity: Secondary | ICD-10-CM | POA: Diagnosis not present

## 2019-08-18 DIAGNOSIS — I739 Peripheral vascular disease, unspecified: Secondary | ICD-10-CM | POA: Diagnosis not present

## 2019-08-18 DIAGNOSIS — M25572 Pain in left ankle and joints of left foot: Secondary | ICD-10-CM | POA: Diagnosis not present

## 2019-08-25 DIAGNOSIS — I87332 Chronic venous hypertension (idiopathic) with ulcer and inflammation of left lower extremity: Secondary | ICD-10-CM | POA: Diagnosis not present

## 2019-08-25 DIAGNOSIS — M25572 Pain in left ankle and joints of left foot: Secondary | ICD-10-CM | POA: Diagnosis not present

## 2019-08-25 DIAGNOSIS — I739 Peripheral vascular disease, unspecified: Secondary | ICD-10-CM | POA: Diagnosis not present

## 2019-09-01 DIAGNOSIS — I739 Peripheral vascular disease, unspecified: Secondary | ICD-10-CM | POA: Diagnosis not present

## 2019-09-01 DIAGNOSIS — I87332 Chronic venous hypertension (idiopathic) with ulcer and inflammation of left lower extremity: Secondary | ICD-10-CM | POA: Diagnosis not present

## 2019-09-08 DIAGNOSIS — M25572 Pain in left ankle and joints of left foot: Secondary | ICD-10-CM | POA: Diagnosis not present

## 2019-09-08 DIAGNOSIS — I87332 Chronic venous hypertension (idiopathic) with ulcer and inflammation of left lower extremity: Secondary | ICD-10-CM | POA: Diagnosis not present

## 2019-09-08 DIAGNOSIS — I739 Peripheral vascular disease, unspecified: Secondary | ICD-10-CM | POA: Diagnosis not present

## 2019-09-10 DIAGNOSIS — I739 Peripheral vascular disease, unspecified: Secondary | ICD-10-CM | POA: Diagnosis not present

## 2019-09-10 DIAGNOSIS — I350 Nonrheumatic aortic (valve) stenosis: Secondary | ICD-10-CM | POA: Diagnosis not present

## 2019-09-10 DIAGNOSIS — L97822 Non-pressure chronic ulcer of other part of left lower leg with fat layer exposed: Secondary | ICD-10-CM | POA: Diagnosis not present

## 2019-09-10 DIAGNOSIS — I87332 Chronic venous hypertension (idiopathic) with ulcer and inflammation of left lower extremity: Secondary | ICD-10-CM | POA: Diagnosis not present

## 2019-09-10 DIAGNOSIS — Z7901 Long term (current) use of anticoagulants: Secondary | ICD-10-CM | POA: Diagnosis not present

## 2019-09-12 NOTE — Progress Notes (Signed)
[place]   Telephone:(336) [phone] Fax:(336) [phone]   Clinic Follow up Note   Patient Care Team: [name], MD as PCP - General (Internal Medicine)  Date of Service:  09/18/2019  CHIEF COMPLAINT: Follow up for Polycythemia Vera  CURRENT THERAPY:  1. Phlebotomy as needed if HCT>47%.  2. Hydrea decreased to 500mg  daily 4-5 days/week since 02/2015; increased to take hydrea 6 days/week starting 02/08/17 (hold on Mondays). Increase to Hydrea 500mg  BID on Mondays and 500mg  once daily for the rest of week starting 12/13/17. Due tothrombocytopeniawe decreased him to 500mg  5 days/week on 01/28/18, hold Mondays and thursdays. Returned to [facility] 500mg  dailyin Jan 2020. Increased to 500mg  bid on Mondays and continue 500mg  daily for the rest of week starting 07/29/19. Reduce to 500mg  daily in early 08/2019 due to recent ulcer.  3. Xarelto 20 mg started about mid-February 2015 and off after 6 month, restarted again in 02/2015 due to left LE DVT  INTERVAL HISTORY:  [name] [name] is here for a follow up of Polycythemia Vera. He was last seen by me 6 months ago. He presents to the clinic alone. He notes he has a left ankle ulcer this time. He has it wrapped today and took a picture of it 2 days ago. He notes he bumped into something and did not realize ir broke skin until pain. He notes his prior right leg ulcer took 5 months to heal. He notes he can still walk independently. It only hurt to walk from the wrapped ankle. He has Tramadol but not helpful so he stopped. He has been using Tylenol as needed.      REVIEW OF SYSTEMS:   Constitutional: Denies fevers, chills or abnormal weight loss Eyes: Denies blurriness of vision Ears, nose, mouth, throat, and face: Denies mucositis or sore throat Respiratory: Denies cough, dyspnea or wheezes Cardiovascular: Denies palpitation, chest discomfort or lower extremity swelling Gastrointestinal:  Denies nausea, heartburn or change in bowel  habits Skin: Denies abnormal skin rashes (+) Left lateral lower ankle skin ulcer, not open  Lymphatics: Denies new lymphadenopathy or easy bruising Neurological:Denies numbness, tingling or new weaknesses Behavioral/Psych: Mood is stable, no new changes  All other systems were reviewed with the patient and are negative.  MEDICAL HISTORY:  Past Medical History:  Diagnosis Date  . Bone marrow disease   . BPH (benign prostatic hypertrophy)   . Depression   . DJD (degenerative joint disease)   . Dyslipidemia   . Gout   . HTN (hypertension)   . Mild aortic stenosis   . Polycythemia vera(238.4)     SURGICAL HISTORY: Past Surgical History:  Procedure Laterality Date  . ENDOVENOUS ABLATION SAPHENOUS VEIN W/ LASER Left 09/04/2016   endovenous laser ablation left greater saphenous vein by Tinnie Gens MD  . HEMORROIDECTOMY      I have reviewed the social history and family history with the patient and they are unchanged from previous note.  ALLERGIES:  is allergic to amoxicillin and colchicine.  MEDICATIONS:  Current Outpatient Medications  Medication Sig Dispense Refill  . acetaminophen (TYLENOL) 500 MG tablet Take 650 mg by mouth every 6 (six) hours as needed.     Marland Kitchen allopurinol (ZYLOPRIM) 100 MG tablet Take 100 mg by mouth 2 (two) times daily.     Marland Kitchen escitalopram (LEXAPRO) 10 MG tablet Take 5 mg by mouth at bedtime.     Marland Kitchen glucosamine-chondroitin 500-400 MG tablet Take 1 tablet by mouth daily.    Marland Kitchen  hydroxyurea (HYDREA) 500 MG capsule 1 tab daily in the morning except 1 tab twice daily on Mondays (Patient taking differently: 500 mg daily. ) 40 capsule 0  . lisinopril (PRINIVIL,ZESTRIL) 10 MG tablet Take 10 mg by mouth daily.    . Multiple Vitamin (MULTIVITAMIN WITH MINERALS) TABS tablet Take 1 tablet by mouth daily.    Marland Kitchen omega-3 acid ethyl esters (LOVAZA) 1 g capsule Take 1 g by mouth daily.    . rivaroxaban (XARELTO) 20 MG TABS tablet Take 20 mg by mouth daily with supper.    .  terazosin (HYTRIN) 5 MG capsule Take 5 mg by mouth at bedtime.    . traMADol (ULTRAM) 50 MG tablet Take 50 mg by mouth 3 (three) times daily as needed.    . vitamin E (VITAMIN E) 400 UNIT capsule Take 400 Units by mouth daily.     No current facility-administered medications for this visit.    PHYSICAL EXAMINATION: ECOG PERFORMANCE STATUS: 2 - Symptomatic, <50% confined to bed  Vitals:   09/18/19 0954  BP: 113/71  Pulse: 83  Resp: 16  Temp: 98.5 F (36.9 C)  SpO2: 97%   Filed Weights   09/18/19 0954  Weight: 168 lb 14.4 oz (76.6 kg)    GENERAL:alert, no distress and comfortable SKIN: skin color, texture, turgor are normal, no rashes or significant lesions (+) Left lateral ankle ulcer (pt showed me picture in his phone), currently wrapped  EYES: normal, Conjunctiva are pink and non-injected, sclera clear  NECK: supple, thyroid normal size, non-tender, without nodularity LYMPH:  no palpable lymphadenopathy in the cervical, axillary  LUNGS: clear to auscultation and percussion with normal breathing effort HEART: regular rate & rhythm and no murmurs and no lower extremity edema ABDOMEN:abdomen soft, non-tender and normal bowel sounds Musculoskeletal:no cyanosis of digits and no clubbing  NEURO: alert & oriented x 3 with fluent speech, no focal motor/sensory deficits  LABORATORY DATA:  I have reviewed the data as listed CBC Latest Ref Rng & Units 09/18/2019 07/24/2019 05/22/2019  WBC 4.0 - 10.5 K/uL 19.0(H) 18.6(H) 16.7(H)  Hemoglobin 13.0 - 17.0 g/dL 15.8 16.1 16.1  Hematocrit 39.0 - 52.0 % 50.5 50.4 50.1  Platelets 150 - 400 K/uL 102(L) 96(L) 106(L)     CMP Latest Ref Rng & Units 09/18/2019 05/22/2019 03/20/2019  Glucose 70 - 99 mg/dL 125(H) 123(H) 96  BUN 8 - 23 mg/dL 27(H) 20 27(H)  Creatinine 0.61 - 1.24 mg/dL 1.31(H) 1.30(H) 1.37(H)  Sodium 135 - 145 mmol/L 139 138 135  Potassium 3.5 - 5.1 mmol/L 4.8 4.7 5.1  Chloride 98 - 111 mmol/L 104 104 102  CO2 22 - 32 mmol/L 26 24 26    Calcium 8.9 - 10.3 mg/dL 8.7(L) 8.9 8.7(L)  Total Protein 6.5 - 8.1 g/dL 5.9(L) 6.1(L) 6.0(L)  Total Bilirubin 0.3 - 1.2 mg/dL 0.7 0.7 0.6  Alkaline Phos 38 - 126 U/L 97 77 81  AST 15 - 41 U/L 21 22 21   ALT 0 - 44 U/L 19 16 15       RADIOGRAPHIC STUDIES: I have personally reviewed the radiological images as listed and agreed with the findings in the report. No results found.   ASSESSMENT & PLAN:  [name] [name] is a 86 y.o. male with   1. Polycythemia vera diagnosed in 1997, JAK2 mutation positive.  -We previously reviewed the nature history of PV, most people do very well, some people would develop myelofibrosis or leukemia late on. The major complication is thrombosis. -He  has been on Hydrea 500 mg daily for 4-5 yearswith dose adjustment as needed. He is currently on Hydrea500mg  bid on Mondays and continue 500mg  daily for the rest of week.  -He is on Xarelto for previous multiple DVTs. Thrombocytopenia certainly increase his risk of bleeding. -We will continue phlebotomy if hematocrit more than 47%(instead of 45% due to his advanced age), he has required phlebotomy every 2 to 3 months, tolerating well. -Last Phlebotomy was 07/24/19. -He is clinically doing well. He had onset of recent left ankle ulcer 5-6 weeks ago after a small injury. Due to this he reduced Hydrea to 500mg  daily the start of 08/2019. Labs reviewed, hg 50.5% today, mild thrombocytopenia stable. Will proceed with phlebotomy today, e is agreeable.  -Given ulcer he will continue at 500mg  daily for now. Will monitor his healing -Continue lab and phlebotomy if hematocrit more than 47%every 3 months -F/u in 3 months    2.RLE DVT, LLE DVT in 02/2015, likely secondary to #1 -I recommend him to continue Xarelto indefinitely, due to recurrent DVT. -He knows to be careful about fall and injury -He had vascular surgery on his left leg on 09/04/16 for poor circulation. Laser ablation of the left great saphenous vein from  the proximal calf to near the saphenofemoral junction performed under local tumescent anesthesia by Dr. Kellie Simmering. -He knows to elevate his legs when he sitsand to avoid falls and injury due to risk of bleeding.  -Continue Xarelto.No signs of bleeding but has stable bruising. I suggest Vitamin C daily to help with this.   3.AS, dyspneaandfatigue -pt complains slightly worsening fatigue and dyspnea on exertion since his acute gastritis in 05/2017  -he has mild AS on echo in 2011, exampreviouslyshowed significant systolic murmur at aortic area, suspect from AS -overall improved, f/u with PCP -fatigue stable, still remains active adequately.  4. Thrombocytopenia, secondary to hydrea  -Hydrea has been dose adjusted as needed. -Goal PLT above 70-80K. -Mild and stable currently   5. Left lower lateral ankle ulcer  -Has had it for 5-6 weeks after bumping it. Ulcer not currently open.  -He has it wrapped today. He will continue to f/u with wound nurse. Will monitor on blood thinner and Hydrea as this can slow down healing process.    PLAN: -Labs reviewed, HCT 50.5% today, proceed with phlebotomy today. He only had 361ml removed today  -Continue hydrea 500mg  daily  -Lab f/u and phlebotomy in 2 months   No problem-specific Assessment & Plan notes found for this encounter.   No orders of the defined types were placed in this encounter.  All questions were answered. The patient knows to call the clinic with any problems, questions or concerns. No barriers to learning was detected. The total time spent in the appointment was 25 minutes.     Truitt Merle, MD 09/18/2019   I, Joslyn Devon, am acting as scribe for Truitt Merle, MD.   I have reviewed the above documentation for accuracy and completeness, and I agree with the above.

## 2019-09-17 DIAGNOSIS — Z7901 Long term (current) use of anticoagulants: Secondary | ICD-10-CM | POA: Diagnosis not present

## 2019-09-17 DIAGNOSIS — L97822 Non-pressure chronic ulcer of other part of left lower leg with fat layer exposed: Secondary | ICD-10-CM | POA: Diagnosis not present

## 2019-09-17 DIAGNOSIS — I87332 Chronic venous hypertension (idiopathic) with ulcer and inflammation of left lower extremity: Secondary | ICD-10-CM | POA: Diagnosis not present

## 2019-09-17 DIAGNOSIS — I350 Nonrheumatic aortic (valve) stenosis: Secondary | ICD-10-CM | POA: Diagnosis not present

## 2019-09-17 DIAGNOSIS — I739 Peripheral vascular disease, unspecified: Secondary | ICD-10-CM | POA: Diagnosis not present

## 2019-09-18 ENCOUNTER — Inpatient Hospital Stay: Payer: Medicare Other

## 2019-09-18 ENCOUNTER — Inpatient Hospital Stay: Payer: Medicare Other | Attending: Hematology | Admitting: Hematology

## 2019-09-18 ENCOUNTER — Encounter: Payer: Self-pay | Admitting: Hematology

## 2019-09-18 ENCOUNTER — Other Ambulatory Visit: Payer: Self-pay

## 2019-09-18 VITALS — BP 113/71 | HR 83 | Temp 98.5°F | Resp 16 | Ht 71.0 in | Wt 168.9 lb

## 2019-09-18 VITALS — BP 141/72 | HR 71

## 2019-09-18 DIAGNOSIS — D45 Polycythemia vera: Secondary | ICD-10-CM | POA: Diagnosis not present

## 2019-09-18 DIAGNOSIS — D6959 Other secondary thrombocytopenia: Secondary | ICD-10-CM | POA: Diagnosis not present

## 2019-09-18 DIAGNOSIS — R5383 Other fatigue: Secondary | ICD-10-CM | POA: Insufficient documentation

## 2019-09-18 DIAGNOSIS — L97329 Non-pressure chronic ulcer of left ankle with unspecified severity: Secondary | ICD-10-CM | POA: Diagnosis not present

## 2019-09-18 DIAGNOSIS — Z7901 Long term (current) use of anticoagulants: Secondary | ICD-10-CM | POA: Diagnosis not present

## 2019-09-18 DIAGNOSIS — Z86718 Personal history of other venous thrombosis and embolism: Secondary | ICD-10-CM | POA: Diagnosis not present

## 2019-09-18 LAB — COMPREHENSIVE METABOLIC PANEL
ALT: 19 U/L (ref 0–44)
AST: 21 U/L (ref 15–41)
Albumin: 3.6 g/dL (ref 3.5–5.0)
Alkaline Phosphatase: 97 U/L (ref 38–126)
Anion gap: 9 (ref 5–15)
BUN: 27 mg/dL — ABNORMAL HIGH (ref 8–23)
CO2: 26 mmol/L (ref 22–32)
Calcium: 8.7 mg/dL — ABNORMAL LOW (ref 8.9–10.3)
Chloride: 104 mmol/L (ref 98–111)
Creatinine, Ser: 1.31 mg/dL — ABNORMAL HIGH (ref 0.61–1.24)
GFR calc Af Amer: 54 mL/min — ABNORMAL LOW (ref >60)
GFR calc non Af Amer: 47 mL/min — ABNORMAL LOW (ref >60)
Glucose, Bld: 125 mg/dL — ABNORMAL HIGH (ref 70–99)
Potassium: 4.8 mmol/L (ref 3.5–5.1)
Sodium: 139 mmol/L (ref 135–145)
Total Bilirubin: 0.7 mg/dL (ref 0.3–1.2)
Total Protein: 5.9 g/dL — ABNORMAL LOW (ref 6.5–8.1)

## 2019-09-18 LAB — CBC WITH DIFFERENTIAL/PLATELET
Abs Immature Granulocytes: 0.28 10*3/uL — ABNORMAL HIGH (ref 0.00–0.07)
Basophils Absolute: 0.3 10*3/uL — ABNORMAL HIGH (ref 0.0–0.1)
Basophils Relative: 2 %
Eosinophils Absolute: 0.1 10*3/uL (ref 0.0–0.5)
Eosinophils Relative: 1 %
HCT: 50.5 % (ref 39.0–52.0)
Hemoglobin: 15.8 g/dL (ref 13.0–17.0)
Immature Granulocytes: 2 %
Lymphocytes Relative: 7 %
Lymphs Abs: 1.2 10*3/uL (ref 0.7–4.0)
MCH: 34.6 pg — ABNORMAL HIGH (ref 26.0–34.0)
MCHC: 31.3 g/dL (ref 30.0–36.0)
MCV: 110.5 fL — ABNORMAL HIGH (ref 80.0–100.0)
Monocytes Absolute: 0.8 10*3/uL (ref 0.1–1.0)
Monocytes Relative: 4 %
Neutro Abs: 16.3 10*3/uL — ABNORMAL HIGH (ref 1.7–7.7)
Neutrophils Relative %: 84 %
Platelets: 102 10*3/uL — ABNORMAL LOW (ref 150–400)
RBC: 4.57 MIL/uL (ref 4.22–5.81)
RDW: 15.2 % (ref 11.5–15.5)
WBC: 19 10*3/uL — ABNORMAL HIGH (ref 4.0–10.5)
nRBC: 0.1 % (ref 0.0–0.2)

## 2019-09-18 NOTE — Patient Instructions (Signed)
Therapeutic Phlebotomy Discharge Instructions  - Increase your fluid intake over the next 4 hours  - No smoking for 30 minutes  - Avoid using the affected arm (the one you had the blood drawn from) for heavy lifting or other activities.  - You may resume all normal activities after 30 minutes.  You are to notify the office if you experience:   - Persistent dizziness and/or lightheadedness - Uncontrolled or excessive bleeding at the site.     Therapeutic Phlebotomy, Care After This sheet gives you information about how to care for yourself after your procedure. Your health care provider may also give you more specific instructions. If you have problems or questions, contact your health care provider. What can I expect after the procedure? After the procedure, it is common to have:  Light-headedness or dizziness. You may feel faint.  Nausea.  Tiredness (fatigue). Follow these instructions at home: Eating and drinking  Be sure to eat well-balanced meals for the next 24 hours.  Drink enough fluid to keep your urine pale yellow.  Avoid drinking alcohol on the day that you had the procedure. Activity   Return to your normal activities as told by your health care provider. Most people can go back to their normal activities right away.  Avoid activities that take a lot of effort for about 5 hours after the procedure. Athletes should avoid strenuous exercise for at least 12 hours.  Avoid heavy lifting or pulling for about 5 hours after the procedure. Do not lift anything that is heavier than 10 lb (4.5 kg).  Change positions slowly for the remainder of the day. This will help to prevent light-headedness or fainting.  If you feel light-headed, lie down until the feeling goes away. Needle insertion site care   Keep your bandage (dressing) dry. You can remove the bandage after about 5 hours or as told by your health care provider.  If you have bleeding from the needle  insertion site, raise (elevate) your arm and press firmly on the site until the bleeding stops.  If you have bruising at the site, apply ice to the area: ? Remove the dressing. ? Put ice in a plastic bag. ? Place a towel between your skin and the bag. ? Leave the ice on for 20 minutes, 2-3 times a day for the first 24 hours.  If the swelling does not go away after 24 hours, apply a warm, moist cloth (warm compress) to the area for 20 minutes, 2-3 times a day. General instructions  Do not use any products that contain nicotine or tobacco, such as cigarettes and e-cigarettes, for at least 30 minutes after the procedure.  Keep all follow-up visits as told by your health care provider. This is important. You may need to continue having regular therapeutic phlebotomy treatments as directed. Contact a health care provider if you:  Have redness, swelling, or pain at the needle insertion site.  Have fluid or blood coming from the needle insertion site.  Have pus or a bad smell coming from the needle insertion site.  Notice that the needle insertion site feels warm to the touch.  Feel light-headed, dizzy, or nauseous, and the feeling does not go away.  Have new bruising at the needle insertion site.  Feel weaker than normal.  Have a fever or chills. Get help right away if:  You faint.  You have chest pain.  You have trouble breathing.  You have severe nausea or vomiting. Summary  After   the procedure, it is common to have some light-headedness, dizziness, nausea, or tiredness (fatigue).  Be sure to eat well-balanced meals for the next 24 hours. Drink enough fluid to keep your urine pale yellow.  Return to your normal activities as told by your health care provider.  Keep all follow-up visits as told by your health care provider. You may need to continue having regular therapeutic phlebotomy treatments as directed. This information is not intended to replace advice given to you  by your health care provider. Make sure you discuss any questions you have with your health care provider. Document Revised: 07/20/2017 Document Reviewed: 07/19/2017 Elsevier Patient Education  2020 [employer]  

## 2019-09-18 NOTE — Progress Notes (Signed)
Therapeutic phlebotomy started at 1140AM with 16g angiocath in left AC. 321mL of blood collected, and was unable to collect any more. Procedure terminated at 1150AM. Pt requested we ask MD if amount was sufficient before sticking him again. Dr. Burr Medico was notified and stated that pt will need to come back in 2 months instead of 3 months if he does not want to be stuck again today. Pt agreeable to come back in 2 months for phlebotomy procedure. MD to send scheduling message. Pt discharged in no acute distress with stable vital signs.

## 2019-09-19 ENCOUNTER — Telehealth: Payer: Self-pay | Admitting: Hematology

## 2019-09-19 NOTE — Telephone Encounter (Signed)
Scheduled appt per 3/4 los.  Sent a message to HIM pool to get a calendar mailed out. 

## 2019-09-22 DIAGNOSIS — I739 Peripheral vascular disease, unspecified: Secondary | ICD-10-CM | POA: Diagnosis not present

## 2019-09-22 DIAGNOSIS — I87332 Chronic venous hypertension (idiopathic) with ulcer and inflammation of left lower extremity: Secondary | ICD-10-CM | POA: Diagnosis not present

## 2019-09-22 DIAGNOSIS — L97822 Non-pressure chronic ulcer of other part of left lower leg with fat layer exposed: Secondary | ICD-10-CM | POA: Diagnosis not present

## 2019-09-22 DIAGNOSIS — Z7901 Long term (current) use of anticoagulants: Secondary | ICD-10-CM | POA: Diagnosis not present

## 2019-09-22 DIAGNOSIS — I350 Nonrheumatic aortic (valve) stenosis: Secondary | ICD-10-CM | POA: Diagnosis not present

## 2019-09-30 DIAGNOSIS — Z7901 Long term (current) use of anticoagulants: Secondary | ICD-10-CM | POA: Diagnosis not present

## 2019-09-30 DIAGNOSIS — I739 Peripheral vascular disease, unspecified: Secondary | ICD-10-CM | POA: Diagnosis not present

## 2019-09-30 DIAGNOSIS — I87332 Chronic venous hypertension (idiopathic) with ulcer and inflammation of left lower extremity: Secondary | ICD-10-CM | POA: Diagnosis not present

## 2019-09-30 DIAGNOSIS — I350 Nonrheumatic aortic (valve) stenosis: Secondary | ICD-10-CM | POA: Diagnosis not present

## 2019-09-30 DIAGNOSIS — L97822 Non-pressure chronic ulcer of other part of left lower leg with fat layer exposed: Secondary | ICD-10-CM | POA: Diagnosis not present

## 2019-10-06 DIAGNOSIS — I739 Peripheral vascular disease, unspecified: Secondary | ICD-10-CM | POA: Diagnosis not present

## 2019-10-06 DIAGNOSIS — I350 Nonrheumatic aortic (valve) stenosis: Secondary | ICD-10-CM | POA: Diagnosis not present

## 2019-10-06 DIAGNOSIS — L97822 Non-pressure chronic ulcer of other part of left lower leg with fat layer exposed: Secondary | ICD-10-CM | POA: Diagnosis not present

## 2019-10-06 DIAGNOSIS — I87332 Chronic venous hypertension (idiopathic) with ulcer and inflammation of left lower extremity: Secondary | ICD-10-CM | POA: Diagnosis not present

## 2019-10-06 DIAGNOSIS — Z7901 Long term (current) use of anticoagulants: Secondary | ICD-10-CM | POA: Diagnosis not present

## 2019-10-09 DIAGNOSIS — Z23 Encounter for immunization: Secondary | ICD-10-CM | POA: Diagnosis not present

## 2019-10-10 DIAGNOSIS — I87332 Chronic venous hypertension (idiopathic) with ulcer and inflammation of left lower extremity: Secondary | ICD-10-CM | POA: Diagnosis not present

## 2019-10-10 DIAGNOSIS — Z7901 Long term (current) use of anticoagulants: Secondary | ICD-10-CM | POA: Diagnosis not present

## 2019-10-10 DIAGNOSIS — I739 Peripheral vascular disease, unspecified: Secondary | ICD-10-CM | POA: Diagnosis not present

## 2019-10-10 DIAGNOSIS — L97822 Non-pressure chronic ulcer of other part of left lower leg with fat layer exposed: Secondary | ICD-10-CM | POA: Diagnosis not present

## 2019-10-10 DIAGNOSIS — I350 Nonrheumatic aortic (valve) stenosis: Secondary | ICD-10-CM | POA: Diagnosis not present

## 2019-10-13 DIAGNOSIS — Z7901 Long term (current) use of anticoagulants: Secondary | ICD-10-CM | POA: Diagnosis not present

## 2019-10-13 DIAGNOSIS — L97822 Non-pressure chronic ulcer of other part of left lower leg with fat layer exposed: Secondary | ICD-10-CM | POA: Diagnosis not present

## 2019-10-13 DIAGNOSIS — I739 Peripheral vascular disease, unspecified: Secondary | ICD-10-CM | POA: Diagnosis not present

## 2019-10-13 DIAGNOSIS — I350 Nonrheumatic aortic (valve) stenosis: Secondary | ICD-10-CM | POA: Diagnosis not present

## 2019-10-13 DIAGNOSIS — I87332 Chronic venous hypertension (idiopathic) with ulcer and inflammation of left lower extremity: Secondary | ICD-10-CM | POA: Diagnosis not present

## 2019-10-20 DIAGNOSIS — Z7901 Long term (current) use of anticoagulants: Secondary | ICD-10-CM | POA: Diagnosis not present

## 2019-10-20 DIAGNOSIS — I87332 Chronic venous hypertension (idiopathic) with ulcer and inflammation of left lower extremity: Secondary | ICD-10-CM | POA: Diagnosis not present

## 2019-10-20 DIAGNOSIS — I739 Peripheral vascular disease, unspecified: Secondary | ICD-10-CM | POA: Diagnosis not present

## 2019-10-20 DIAGNOSIS — I350 Nonrheumatic aortic (valve) stenosis: Secondary | ICD-10-CM | POA: Diagnosis not present

## 2019-10-20 DIAGNOSIS — L97822 Non-pressure chronic ulcer of other part of left lower leg with fat layer exposed: Secondary | ICD-10-CM | POA: Diagnosis not present

## 2019-10-27 DIAGNOSIS — L97822 Non-pressure chronic ulcer of other part of left lower leg with fat layer exposed: Secondary | ICD-10-CM | POA: Diagnosis not present

## 2019-10-27 DIAGNOSIS — I87332 Chronic venous hypertension (idiopathic) with ulcer and inflammation of left lower extremity: Secondary | ICD-10-CM | POA: Diagnosis not present

## 2019-10-27 DIAGNOSIS — Z7901 Long term (current) use of anticoagulants: Secondary | ICD-10-CM | POA: Diagnosis not present

## 2019-10-27 DIAGNOSIS — I739 Peripheral vascular disease, unspecified: Secondary | ICD-10-CM | POA: Diagnosis not present

## 2019-10-27 DIAGNOSIS — I350 Nonrheumatic aortic (valve) stenosis: Secondary | ICD-10-CM | POA: Diagnosis not present

## 2019-11-05 DIAGNOSIS — I739 Peripheral vascular disease, unspecified: Secondary | ICD-10-CM | POA: Diagnosis not present

## 2019-11-05 DIAGNOSIS — L97822 Non-pressure chronic ulcer of other part of left lower leg with fat layer exposed: Secondary | ICD-10-CM | POA: Diagnosis not present

## 2019-11-05 DIAGNOSIS — I87332 Chronic venous hypertension (idiopathic) with ulcer and inflammation of left lower extremity: Secondary | ICD-10-CM | POA: Diagnosis not present

## 2019-11-05 DIAGNOSIS — I350 Nonrheumatic aortic (valve) stenosis: Secondary | ICD-10-CM | POA: Diagnosis not present

## 2019-11-05 DIAGNOSIS — Z23 Encounter for immunization: Secondary | ICD-10-CM | POA: Diagnosis not present

## 2019-11-05 DIAGNOSIS — Z7901 Long term (current) use of anticoagulants: Secondary | ICD-10-CM | POA: Diagnosis not present

## 2019-11-09 DIAGNOSIS — I350 Nonrheumatic aortic (valve) stenosis: Secondary | ICD-10-CM | POA: Diagnosis not present

## 2019-11-09 DIAGNOSIS — L97822 Non-pressure chronic ulcer of other part of left lower leg with fat layer exposed: Secondary | ICD-10-CM | POA: Diagnosis not present

## 2019-11-09 DIAGNOSIS — I739 Peripheral vascular disease, unspecified: Secondary | ICD-10-CM | POA: Diagnosis not present

## 2019-11-09 DIAGNOSIS — Z7901 Long term (current) use of anticoagulants: Secondary | ICD-10-CM | POA: Diagnosis not present

## 2019-11-09 DIAGNOSIS — I87332 Chronic venous hypertension (idiopathic) with ulcer and inflammation of left lower extremity: Secondary | ICD-10-CM | POA: Diagnosis not present

## 2019-11-19 ENCOUNTER — Ambulatory Visit: Payer: Medicare Other | Admitting: Hematology

## 2019-11-19 ENCOUNTER — Other Ambulatory Visit: Payer: Medicare Other

## 2019-11-19 NOTE — Progress Notes (Signed)
[place]   Telephone:(336) [phone] Fax:(336) [phone]   Clinic Follow up Note   Patient Care Team: [name], MD as PCP - General (Internal Medicine)  Date of Service:  11/20/2019  CHIEF COMPLAINT: Follow up for Polycythemia Vera  CURRENT THERAPY:  1. Phlebotomy as needed if HCT>47%.  2. Hydrea decreased to 500mg  daily 4-5 days/week since 02/2015; increased to take hydrea 6 days/week starting 02/08/17 (hold on Mondays). Increase to Hydrea 500mg  BID on Mondays and 500mg  once daily for the rest of week starting 12/13/17. Due tothrombocytopeniawe decreased him to 500mg  5 days/week on 01/28/18, hold Mondays and thursdays. Returned to [facility] 500mg  dailyin Jan 2020. Increased to 500mg  bid on Mondays and continue 500mg  daily for the rest of week starting 07/29/19. Reduce to 500mg  daily in early 08/2019 due to recent ulcer.  3. Xarelto 20 mg started about mid-February 2015 and off after 6 month, restarted again in 02/2015 due to left LE DVT  INTERVAL HISTORY:  GERRED ARNTZEN is here for a follow up of PV. He presents to the clinic alone. He notes he is doing well and stable. He notes he still has ulcer of his left lower leg above ankle. He is seen by wound nurse to have it wrapped each week. It is about grape sized now. His wound can cause pain which flares when he lays down to standing for long periods of time. He notes he continues to tolerate phlebotomies. He notes he eat and drank water today. He notes when he ambulates he does so independently and denies falls. I reviewed his medication list with him. He is no longer taking Tramadol.    REVIEW OF SYSTEMS:   Constitutional: Denies fevers, chills or abnormal weight loss Eyes: Denies blurriness of vision Ears, nose, mouth, throat, and face: Denies mucositis or sore throat Respiratory: Denies cough, dyspnea or wheezes Cardiovascular: Denies palpitation, chest discomfort or lower extremity swelling Gastrointestinal:  Denies  nausea, heartburn or change in bowel habits Skin: Denies abnormal skin rashes (+) ulcer with pain of left lower lateral leg, wrapped  Lymphatics: Denies new lymphadenopathy or easy bruising Neurological:Denies numbness, tingling or new weaknesses Behavioral/Psych: Mood is stable, no new changes  All other systems were reviewed with the patient and are negative.  MEDICAL HISTORY:  Past Medical History:  Diagnosis Date  . Bone marrow disease   . BPH (benign prostatic hypertrophy)   . Depression   . DJD (degenerative joint disease)   . Dyslipidemia   . Gout   . HTN (hypertension)   . Mild aortic stenosis   . Polycythemia vera(238.4)     SURGICAL HISTORY: Past Surgical History:  Procedure Laterality Date  . ENDOVENOUS ABLATION SAPHENOUS VEIN W/ LASER Left 09/04/2016   endovenous laser ablation left greater saphenous vein by Tinnie Gens MD  . HEMORROIDECTOMY      I have reviewed the social history and family history with the patient and they are unchanged from previous note.  ALLERGIES:  is allergic to amoxicillin and colchicine.  MEDICATIONS:  Current Outpatient Medications  Medication Sig Dispense Refill  . acetaminophen (TYLENOL) 500 MG tablet Take 650 mg by mouth every 6 (six) hours as needed.     Marland Kitchen allopurinol (ZYLOPRIM) 100 MG tablet Take 100 mg by mouth 2 (two) times daily.     Marland Kitchen escitalopram (LEXAPRO) 10 MG tablet Take 5 mg by mouth at bedtime.     Marland Kitchen glucosamine-chondroitin 500-400 MG tablet Take 1 tablet by mouth daily.    Marland Kitchen  hydroxyurea (HYDREA) 500 MG capsule 1 tab daily in the morning except 1 tab twice daily on Mondays (Patient taking differently: 500 mg daily. ) 40 capsule 0  . lisinopril (PRINIVIL,ZESTRIL) 10 MG tablet Take 10 mg by mouth daily.    . Multiple Vitamin (MULTIVITAMIN WITH MINERALS) TABS tablet Take 1 tablet by mouth daily.    Marland Kitchen omega-3 acid ethyl esters (LOVAZA) 1 g capsule Take 1 g by mouth daily.    . rivaroxaban (XARELTO) 20 MG TABS tablet Take  20 mg by mouth daily with supper.    . terazosin (HYTRIN) 5 MG capsule Take 5 mg by mouth at bedtime.    . vitamin E (VITAMIN E) 400 UNIT capsule Take 400 Units by mouth daily.     No current facility-administered medications for this visit.    PHYSICAL EXAMINATION: ECOG PERFORMANCE STATUS: 3 - Symptomatic, >50% confined to bed  Vitals:   11/20/19 1051  BP: 107/70  Pulse: 75  Resp: 17  Temp: 98.9 F (37.2 C)  SpO2: 94%   Filed Weights   11/20/19 1051  Weight: 170 lb 1.6 oz (77.2 kg)    Due to COVID19 we will limit examination to appearance. Patient had no complaints.  GENERAL:alert, no distress and comfortable SKIN: skin color normal, no rashes or significant lesions (+) Left lower leg wrapped EYES: normal, Conjunctiva are pink and non-injected, sclera clear  NEURO: alert & oriented x 3 with fluent speech   LABORATORY DATA:  I have reviewed the data as listed CBC Latest Ref Rng & Units 11/20/2019 09/18/2019 07/24/2019  WBC 4.0 - 10.5 K/uL 19.2(H) 19.0(H) 18.6(H)  Hemoglobin 13.0 - 17.0 g/dL 16.2 15.8 16.1  Hematocrit 39.0 - 52.0 % 52.4(H) 50.5 50.4  Platelets 150 - 400 K/uL 95(L) 102(L) 96(L)     CMP Latest Ref Rng & Units 11/20/2019 09/18/2019 05/22/2019  Glucose 70 - 99 mg/dL 123(H) 125(H) 123(H)  BUN 8 - 23 mg/dL 20 27(H) 20  Creatinine 0.61 - 1.24 mg/dL 1.20 1.31(H) 1.30(H)  Sodium 135 - 145 mmol/L 140 139 138  Potassium 3.5 - 5.1 mmol/L 4.6 4.8 4.7  Chloride 98 - 111 mmol/L 105 104 104  CO2 22 - 32 mmol/L 26 26 24   Calcium 8.9 - 10.3 mg/dL 8.5(L) 8.7(L) 8.9  Total Protein 6.5 - 8.1 g/dL 5.6(L) 5.9(L) 6.1(L)  Total Bilirubin 0.3 - 1.2 mg/dL 0.7 0.7 0.7  Alkaline Phos 38 - 126 U/L 88 97 77  AST 15 - 41 U/L 20 21 22   ALT 0 - 44 U/L 16 19 16       RADIOGRAPHIC STUDIES: I have personally reviewed the radiological images as listed and agreed with the findings in the report. No results found.   ASSESSMENT & PLAN:  ARMOUR SILVER is a 86 y.o. male with    1.  Polycythemia vera diagnosed in 1997, JAK2 mutation positive.  -We previously reviewed the nature history of PV, most people do very well, some people would develop myelofibrosis or leukemia late on. The major complication is thrombosis. -He has been on Hydrea 500 mg daily for 4-5 yearswith dose adjustment as needed. He is currently on Hydrea500mg  bid on Mondays and continue 500mg  daily for the rest of week.  -He is on Xarelto for previous multiple DVTs. Thrombocytopenia certainly increase his risk of bleeding. -We will continue phlebotomy if hematocrit more than 47%(instead of 45% due to his advanced age), he has required phlebotomy every 2 to 3 months, tolerating well. -Last Phlebotomy was  07/24/19. -He is clinically doing well and stable. Labs reviewed, WBC 19.2, Hct 52.4%, MCV 110.3, MCH 34.1, plt 95K, ANC 16.4. Will proceed with Phlebotomy today.  -Continue Hydrea 500mg  daily. Due to his long standing left ankle wound, will not increase Hydrea dose  -Continue lab and phlebotomy if hematocrit more than 47%every 2 months -F/u in 4 months    2.RLE DVT, LLE DVT in 02/2015, likely secondary to #1 -I recommend him to continue Xarelto indefinitely, due to recurrent DVT. -He knows to be careful about fall and injury -He had vascular surgery on his left leg on 09/04/16 for poor circulation. Laser ablation of the left great saphenous vein from the proximal calf to near the saphenofemoral junction performed under local tumescent anesthesia by Dr. Kellie Simmering. -He knows to elevate his legs when he sitsand to avoid falls and injury due to risk of bleeding. -Continue Xarelto.No signs of bleeding but has stable bruising. I previously suggested Vitamin C daily to help with this.  3.AS, dyspneaandfatigue -pt complains slightly worsening fatigue and dyspnea on exertion since his acute gastritis in 05/2017  -he has mild AS on echo in 2011, exampreviouslyshowed significant systolic murmur at aortic  area, suspect from AS -overall improved, f/u with PCP -fatigue stable, still remains active adequately.  4. Thrombocytopenia, secondary to hydrea  -Hydrea has been dose adjusted as needed. -Goal PLT above 70-80K. -Mild and stable currently   5. Left lower lateral ankle ulcer  -Has had it for 5-6 weeks after bumping it. Ulcer not currently open.  -He has it wrapped today. He will continue to f/u with wound nurse weekly. He notes the wound continues to heal slowly.  -Will monitor on blood thinner and Hydrea as this can slow down healing process.  -This does cause pain but mainly when laying down or when standing for long periods of time.    PLAN: -Labs reviewed, Hct 52.4%, Proceed with Phlebotomy today  -Continue hydrea 500mg  daily -Lab and phlebotomy in 2 and 4 months  -F/u in 4 months    No problem-specific Assessment & Plan notes found for this encounter.   No orders of the defined types were placed in this encounter.  All questions were answered. The patient knows to call the clinic with any problems, questions or concerns. No barriers to learning was detected. The total time spent in the appointment was 20 minutes.     Truitt Merle, MD 11/20/2019   I, Joslyn Devon, am acting as scribe for Truitt Merle, MD.   I have reviewed the above documentation for accuracy and completeness, and I agree with the above.

## 2019-11-20 ENCOUNTER — Other Ambulatory Visit: Payer: Self-pay

## 2019-11-20 ENCOUNTER — Encounter: Payer: Self-pay | Admitting: Hematology

## 2019-11-20 ENCOUNTER — Inpatient Hospital Stay (HOSPITAL_BASED_OUTPATIENT_CLINIC_OR_DEPARTMENT_OTHER): Payer: Medicare Other | Admitting: Hematology

## 2019-11-20 ENCOUNTER — Inpatient Hospital Stay: Payer: Medicare Other | Attending: Hematology

## 2019-11-20 ENCOUNTER — Inpatient Hospital Stay: Payer: Medicare Other

## 2019-11-20 VITALS — BP 107/70 | HR 75 | Temp 98.9°F | Resp 17 | Ht 71.0 in | Wt 170.1 lb

## 2019-11-20 DIAGNOSIS — D45 Polycythemia vera: Secondary | ICD-10-CM

## 2019-11-20 LAB — CBC WITH DIFFERENTIAL/PLATELET
Abs Immature Granulocytes: 0.25 10*3/uL — ABNORMAL HIGH (ref 0.00–0.07)
Basophils Absolute: 0.3 10*3/uL — ABNORMAL HIGH (ref 0.0–0.1)
Basophils Relative: 2 %
Eosinophils Absolute: 0.2 10*3/uL (ref 0.0–0.5)
Eosinophils Relative: 1 %
HCT: 52.4 % — ABNORMAL HIGH (ref 39.0–52.0)
Hemoglobin: 16.2 g/dL (ref 13.0–17.0)
Immature Granulocytes: 1 %
Lymphocytes Relative: 7 %
Lymphs Abs: 1.3 10*3/uL (ref 0.7–4.0)
MCH: 34.1 pg — ABNORMAL HIGH (ref 26.0–34.0)
MCHC: 30.9 g/dL (ref 30.0–36.0)
MCV: 110.3 fL — ABNORMAL HIGH (ref 80.0–100.0)
Monocytes Absolute: 0.7 10*3/uL (ref 0.1–1.0)
Monocytes Relative: 4 %
Neutro Abs: 16.4 10*3/uL — ABNORMAL HIGH (ref 1.7–7.7)
Neutrophils Relative %: 85 %
Platelets: 95 10*3/uL — ABNORMAL LOW (ref 150–400)
RBC: 4.75 MIL/uL (ref 4.22–5.81)
RDW: 14.3 % (ref 11.5–15.5)
WBC: 19.2 10*3/uL — ABNORMAL HIGH (ref 4.0–10.5)
nRBC: 0 % (ref 0.0–0.2)

## 2019-11-20 LAB — COMPREHENSIVE METABOLIC PANEL
ALT: 16 U/L (ref 0–44)
AST: 20 U/L (ref 15–41)
Albumin: 3.4 g/dL — ABNORMAL LOW (ref 3.5–5.0)
Alkaline Phosphatase: 88 U/L (ref 38–126)
Anion gap: 9 (ref 5–15)
BUN: 20 mg/dL (ref 8–23)
CO2: 26 mmol/L (ref 22–32)
Calcium: 8.5 mg/dL — ABNORMAL LOW (ref 8.9–10.3)
Chloride: 105 mmol/L (ref 98–111)
Creatinine, Ser: 1.2 mg/dL (ref 0.61–1.24)
GFR calc Af Amer: >60 mL/min (ref >60)
GFR calc non Af Amer: 52 mL/min — ABNORMAL LOW (ref >60)
Glucose, Bld: 123 mg/dL — ABNORMAL HIGH (ref 70–99)
Potassium: 4.6 mmol/L (ref 3.5–5.1)
Sodium: 140 mmol/L (ref 135–145)
Total Bilirubin: 0.7 mg/dL (ref 0.3–1.2)
Total Protein: 5.6 g/dL — ABNORMAL LOW (ref 6.5–8.1)

## 2019-11-20 NOTE — Progress Notes (Signed)
Pt presented today for phlebotomy per MD order.  A 16 gauge needle was placed in the left AC and 531ml of blood was removed.  The needle was removed, catheter fully intact.  Patient tolerated well; food and beverage provided.  Patient stayed 30 min post observation. VSS.

## 2019-11-20 NOTE — Patient Instructions (Signed)

## 2019-11-21 ENCOUNTER — Telehealth: Payer: Self-pay | Admitting: Hematology

## 2019-11-21 NOTE — Telephone Encounter (Signed)
Scheduled appt per 5/6 los  Spoke with pt and he is aware of the appt date and time.

## 2019-12-09 DIAGNOSIS — I87332 Chronic venous hypertension (idiopathic) with ulcer and inflammation of left lower extremity: Secondary | ICD-10-CM | POA: Diagnosis not present

## 2019-12-12 ENCOUNTER — Encounter (HOSPITAL_BASED_OUTPATIENT_CLINIC_OR_DEPARTMENT_OTHER): Payer: Medicare Other | Attending: Internal Medicine | Admitting: Internal Medicine

## 2019-12-12 DIAGNOSIS — D45 Polycythemia vera: Secondary | ICD-10-CM | POA: Insufficient documentation

## 2019-12-12 DIAGNOSIS — Z7901 Long term (current) use of anticoagulants: Secondary | ICD-10-CM | POA: Diagnosis not present

## 2019-12-12 DIAGNOSIS — Z880 Allergy status to penicillin: Secondary | ICD-10-CM | POA: Insufficient documentation

## 2019-12-12 DIAGNOSIS — L97522 Non-pressure chronic ulcer of other part of left foot with fat layer exposed: Secondary | ICD-10-CM | POA: Diagnosis not present

## 2019-12-12 DIAGNOSIS — Z886 Allergy status to analgesic agent status: Secondary | ICD-10-CM | POA: Insufficient documentation

## 2019-12-12 DIAGNOSIS — I878 Other specified disorders of veins: Secondary | ICD-10-CM | POA: Diagnosis not present

## 2019-12-12 DIAGNOSIS — L97529 Non-pressure chronic ulcer of other part of left foot with unspecified severity: Secondary | ICD-10-CM | POA: Diagnosis not present

## 2019-12-12 DIAGNOSIS — Z86718 Personal history of other venous thrombosis and embolism: Secondary | ICD-10-CM | POA: Insufficient documentation

## 2019-12-12 DIAGNOSIS — I872 Venous insufficiency (chronic) (peripheral): Secondary | ICD-10-CM | POA: Diagnosis not present

## 2019-12-17 NOTE — Progress Notes (Signed)
[name] [name], DETZLER ([id number]) Visit Report for 12/12/2019 Chief Complaint Document Details Patient Name: Date of Service: [place], [place] 12/12/2019 10:30 A M Medical Record Number: [id number] Patient Account Number: [id number] Date of Birth/Sex: Treating RN: 10-04-1923 (86 y.o. [name] Primary Care Provider: Tivis Ringer Other Clinician: Referring Provider: Treating Provider/Extender: Nani Ravens Weeks in Treatment: 0 Information Obtained from: Patient Chief Complaint Left foot ulcers x 2 weeks Electronic Signature(s) Signed: 12/12/2019 11:45:29 AM By: Tobi Bastos MD, MBA Entered By: Tobi Bastos on 12/12/2019 11:45:29 -------------------------------------------------------------------------------- HPI Details Patient Name: Date of Service: [name] [name], [name] BERT L. 12/12/2019 10:30 A M Medical Record Number: [id number] Patient Account Number: [id number] Date of Birth/Sex: Treating RN: 09-Mar-1924 (86 y.o. [name] Primary Care Provider: Tivis Ringer Other Clinician: Referring Provider: Treating Provider/Extender: Fidela Salisbury, Ravisankar R Weeks in Treatment: 0 History of Present Illness HPI Description: ............................ FROM our [place] clinic History of Present Illness (HPI) The following HPI elements were documented for the patient's wound: Location: noticed an ulcer on the left ankle about a month Quality: Patient reports experiencing a dull pain to affected area(s). Severity: Patient states wound are getting worse. Duration: Patient has had the wound for < 4 weeks prior to presenting for treatment Timing: Pain in wound is Intermittent (comes and goes Context: The wound appeared gradually over time Modifying Factors: Other treatment(s) tried include:doxycycline on 2 different occasions and a light compression wrap Associated Signs and Symptoms: Patient reports having increase  swelling. 86 year old gentleman with a past medical history of hypertension, polycythemia, diverticulitis, left lower extremity venous ulcer. He has never been a smoker. As recently seen by his PCP Dr. Dagmar Hait, for venous stasis edema with ulcer on the left lower extremity and has completed the second course of doxycycline. Is also advised duoderm and wrapping the leg with Kling and Coban. 03/16/2016 -- not yet received his vascular appointment for venous duplex studies. Due to his original wound he now had a blister which has opened up to a superficial ulceration ADMISSION/Transfer 03/23/16; patient has reflux studies booked for September 20. He has been using silver alginate under Profore lite wrap 04/05/2016 -- he had his venous duplex study done earlier today and I understand he has an appointment to see the surgeon on Friday. 04/12/2016 -- venous duplex study done on 04/05/2016 showed chronic deep vein thrombosis in the left femoral vein with the left greater saphenous vein reflux of more than 500 ms noted from the saphenofemoral junction to below the knee. Tortuosity is noted. He was then seen by Dr. Siri Cole at [facility] vascular whose opinion was that he had C6 venous disease with significant greater saphenous vein reflux and skin changes. He recommended the patient see Dr. Kellie Simmering in the vein clinic in 1 month's time for consideration of great saphenous vein ablation on the left. 04/19/16; patient's wounds on his medial left leg are much better. He has what appears to be a small dime-sized blister on the left anterior leg. This is new from last week. He has been under compression. 04/25/16; his original wounds are all resolved. The blister that he had on the left anterior leg is epithelialized. Some discoloration underneath the tan-brown color. The patient states that he did not have any skin lesion here before although I think this needs to be watched and I told the patient  this. 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 Patient was in the wound clinic many years ago for left lower extremity wounds but has not been recently in the wound clinic for any reason, the original wound for which he required Unna boot compression was due to his dropping an iPad on his left ankle-this has healed however Patient's history significant for bilateral DVT for which she is on Xarelto, venous stasis and hemosiderin staining on his left leg Polycythemia vera for which he is on every 67-month phlebotomy routine. Patient is also on Hydrea Electronic Signature(s) Signed: 12/12/2019 11:48:07 AM By: Tobi Bastos MD, MBA Entered By: Tobi Bastos on 12/12/2019 11:48:07 -------------------------------------------------------------------------------- Physical Exam Details Patient Name: Date of Service: [name] [name], [name] BERT L. 12/12/2019 10:30 A M Medical Record Number: [id number] Patient Account Number: [id number] Date of Birth/Sex: Treating RN: [birth year] (86 y.o. [name] Primary Care Provider: Tivis Ringer Other Clinician: Referring Provider: Treating Provider/Extender: Fidela Salisbury, Ravisankar R Weeks in Treatment: 0 Constitutional alert and oriented x 3. sitting or standing blood pressure is within target range for patient.. supine blood pressure is within target range for patient.. pulse regular and within target range for patient.Marland Kitchen respirations regular, non-labored and within target range for patient.Marland Kitchen temperature within target range for patient.. . .  Well- nourished and well-hydrated in no acute distress. Eyes conjunctiva clear no eyelid edema noted. pupils equal round and reactive to light and accommodation. Ears, Nose, Mouth, and Throat no gross abnormality of ear auricles or external auditory canals. normal hearing noted during conversation. mucus membranes moist. Neck supple with no LAD noted in anterior or posterior cervical chain. not enlarged. Respiratory normal breathing without difficulty. clear to auscultation bilaterally. Cardiovascular regular rate and rhythm with normal S1, S2. no bruits with no significant JVD. 2+ femoral pulses. 2+ dorsalis pedis/posterior tibialis pulses. no clubbing, cyanosis, significant edema, <3 sec cap refill. Gastrointestinal (GI) soft, non-tender, non-distended, +BS. no hepatosplenomegaly. no ventral hernia noted. Musculoskeletal normal gait and posture. no significant deformity or arthritic changes, no loss or range of motion, no clubbing. full range of motion without deformity. full range of motion without deformity. full range of motion with greater than 10 degrees of flexion of the ankle. full range of motion with greater than 10 degrees of flexion of the ankle. Integumentary (Hair, Skin) normal hair distribution and pattern. skin pink, warm, dry. Neurological cranial nerves 2-12 intact. Patient has normal sensation in the feet bilaterally to light touch. Psychiatric this patient is able to make decisions and demonstrates good insight into disease process. Alert and Oriented x 3. pleasant and cooperative. Notes Left lower extremity has significant degree of hemosiderin staining, open areas include the left lateral small area and left dorsal foot area that is also very small with skin depth ulceration. There is not much swelling in this leg and he was in an [employer] as of this morning. Electronic Signature(s) Signed: 12/12/2019 11:49:09 AM By: Tobi Bastos MD, MBA Entered By: Tobi Bastos on 12/12/2019 11:49:08 -------------------------------------------------------------------------------- Physician Orders Details Patient Name: Date of Service: [name] [name], [name] BERT L. 12/12/2019 10:30 A M Medical Record Number: [id number] Patient Account Number: [id number] Date of Birth/Sex: Treating RN: 02/23/1924 (86 y.o.  [name] Primary Care Provider: Tivis Ringer Other Clinician: Referring Provider: Treating Provider/Extender: Fidela Salisbury, Ravisankar R Weeks in Treatment: 0 Verbal / Phone Orders: No Diagnosis Coding Follow-up Appointments Return Appointment in 1 week. Dressing Change Frequency Wound #3 Left Lower Leg Change dressing three times week. - Two times by [name] and once at wound care center Wound #4 Left,Dorsal Foot Change dressing three times week. - Two times by [facility] and once at wound care center Skin Barriers/Peri-Wound Care Moisturizing lotion Wound Cleansing May shower and wash wound with soap and water. - on days that dressing is changed Primary Wound Dressing Wound #3 Left Lower Leg Silver Collagen - moisten with hydrogel Wound #4 Left,Dorsal Foot Silver Collagen - moisten with hydrogel Secondary Dressing Wound #3 Left Lower Leg ABD pad Wound #4 Left,Dorsal Foot ABD pad Edema Control Kerlix and Coban - Left Lower Extremity - use cotton instead of kerlix. Avoid standing for long periods of time Elevate legs to the level of the heart or above for 30 minutes daily and/or when sitting, a frequency of: Exercise regularly [facility] for wound care. - [facility] Electronic Signature(s) Signed: 12/12/2019 4:30:26 PM By: Tobi Bastos MD, MBA Signed: 12/12/2019 4:59:31 PM By: Kela Millin Entered By: Kela Millin on 12/12/2019 11:41:32 -------------------------------------------------------------------------------- Problem List Details Patient Name: Date of Service: [name] [name], [name] BERT L.  12/12/2019 10:30 A M Medical Record Number: [id number] Patient Account Number: [id number] Date of Birth/Sex: Treating RN: Dec 10, 1923 (86 y.o. [name] Primary Care Provider: Tivis Ringer Other Clinician: Referring Provider: Treating Provider/Extender: Fidela Salisbury, Ravisankar R Weeks in Treatment: 0 Active Problems ICD-10 Encounter Code Description Active Date MDM Diagnosis L97.529 Non-pressure chronic ulcer of other part of left foot with unspecified severity 12/12/2019 No Yes D45 Polycythemia vera 12/12/2019 No Yes I87.2 Venous insufficiency (chronic) (peripheral) 12/12/2019 No Yes Inactive Problems Resolved Problems Electronic Signature(s) Signed: 12/12/2019 11:51:10 AM By: Tobi Bastos MD, MBA Previous Signature: 12/12/2019 11:44:57 AM Version By: Tobi Bastos MD, MBA Entered By: Tobi Bastos on 12/12/2019 11:51:10 -------------------------------------------------------------------------------- Progress Note Details Patient Name: Date of Service: [name] [name], [name] BERT L. 12/12/2019 10:30 A M Medical Record Number: [id number] Patient Account Number: [id number] Date of Birth/Sex: Treating RN: 1923-10-24 (86 y.o. [name] Primary Care Provider: Tivis Ringer Other Clinician: Referring Provider: Treating Provider/Extender: Fidela Salisbury, Ravisankar R Weeks in Treatment: 0 Subjective Chief Complaint Information obtained from Patient Left foot ulcers x 2 weeks History of Present Illness (HPI) ............................ FROM our [place] clinic History of Present Illness (HPI) The following HPI elements were documented for the patient's wound: Location: noticed an ulcer on the left ankle about a month Quality: Patient reports experiencing a dull pain to affected area(s). Severity: Patient states wound are getting worse. Duration: Patient has had the wound for < 4 weeks prior to presenting for treatment Timing: Pain in wound is  Intermittent (comes and goes Context: The wound appeared gradually over time Modifying Factors: Other treatment(s) tried include:doxycycline on 2 different occasions and a light compression wrap Associated Signs and Symptoms: Patient reports having increase swelling. 86 year old gentleman with a past medical history of hypertension, polycythemia, diverticulitis, left lower extremity venous ulcer. He has never been a smoker. As recently seen by his PCP Dr. Dagmar Hait, for venous stasis edema with ulcer on the left lower extremity and has completed the second course of doxycycline. Is also advised duoderm and wrapping the leg with Kling and Coban. 03/16/2016 --  not yet received his vascular appointment for venous duplex studies. Due to his original wound he now had a blister which has opened up to a superficial ulceration ADMISSION/Transfer 03/23/16; patient has reflux studies booked for September 20. He has been using silver alginate under Profore lite wrap 04/05/2016 -- he had his venous duplex study done earlier today and I understand he has an appointment to see the surgeon on Friday. 04/12/2016 -- venous duplex study done on 04/05/2016 showed chronic deep vein thrombosis in the left femoral vein with the left greater saphenous vein reflux of more than 500 ms noted from the saphenofemoral junction to below the knee. Tortuosity is noted. He was then seen by Dr. Siri Cole at vein and vascular whose opinion was that he had C6 venous disease with significant greater saphenous vein reflux and skin changes. He recommended the patient see Dr. Kellie Simmering in the vein clinic in 1 month's time for consideration of great saphenous vein ablation on the left. 04/19/16; patient's wounds on his medial left leg are much better. He has what appears to be a small dime-sized blister on the left anterior leg. This is new from last week. He has been under compression. 04/25/16; his original wounds are all resolved. The  blister that he had on the left anterior leg is epithelialized. Some discoloration underneath the tan-brown color. The patient states that he did not have any skin lesion here before although I think this needs to be watched and I told the patient this. 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 Patient was in the wound clinic many years ago for left lower extremity wounds but has not been recently in the wound clinic for any reason, the original wound for which he required Unna boot compression was due to his dropping an iPad on his left ankle-this has healed however Patient's history significant for bilateral DVT for which she is on Xarelto, venous stasis and hemosiderin staining on his left leg Polycythemia vera for which he is on every 37-month phlebotomy routine. Patient is also on Hydrea Patient History Information obtained from Patient. Allergies penicillin (Severity: Severe), colchicine (Severity: Severe) Family History Hypertension - Mother, No family history of Cancer, Diabetes, Heart Disease, Hereditary Spherocytosis, Kidney Disease, Lung Disease, Seizures, Stroke, Thyroid Problems, Tuberculosis. Social History Never smoker, Marital Status - Married, Alcohol Use - Rarely, Drug Use - No History, Caffeine Use - Daily. Medical History Eyes Patient has history of Cataracts - s/p surgery Denies history of Glaucoma, Optic Neuritis Ear/Nose/Mouth/Throat Denies history of Chronic sinus problems/congestion, Middle ear problems Hematologic/Lymphatic Denies history of Anemia, Hemophilia,  Human Immunodeficiency Virus, Lymphedema, Sickle Cell Disease Respiratory Denies history of Aspiration, Asthma, Chronic Obstructive Pulmonary Disease (COPD), Pneumothorax, Sleep Apnea, Tuberculosis Cardiovascular Patient has history of Hypertension Denies history of Angina, Arrhythmia, Congestive Heart Failure, Coronary Artery Disease, Deep Vein Thrombosis, Hypotension, Myocardial Infarction, Peripheral Arterial Disease, Peripheral Venous Disease, Phlebitis, Vasculitis Gastrointestinal Denies history of Cirrhosis , Colitis, Crohnoos, Hepatitis A, Hepatitis B, Hepatitis C Endocrine Denies history of Type I Diabetes, Type II Diabetes Genitourinary Denies history of End Stage Renal Disease Integumentary (Skin) Denies history of  History of Burn Musculoskeletal Denies history of Gout, Rheumatoid Arthritis, Osteoarthritis, Osteomyelitis Neurologic Denies history of Dementia, Neuropathy, Quadriplegia, Paraplegia, Seizure Disorder Oncologic Denies history of Received Chemotherapy, Received Radiation Psychiatric Denies history of Anorexia/bulimia, Confinement Anxiety Medical A Surgical History Notes nd Hematologic/Lymphatic polycythemia vera , bone marrow disease , hx of DVT in bilat legs Cardiovascular dyslipidemia , mild aortic stenosis Genitourinary BPH Musculoskeletal degenerative joint disease Neurologic gout , arthritis Review of Systems (ROS) Constitutional Symptoms (General Health) Denies complaints or symptoms of Fatigue, Fever, Chills, Marked Weight Change. Eyes Denies complaints or symptoms of Dry Eyes, Vision Changes, Glasses / Contacts. Ear/Nose/Mouth/Throat Denies complaints or symptoms of Chronic sinus problems or rhinitis. Respiratory Denies complaints or symptoms of Chronic or frequent coughs, Shortness of Breath. Cardiovascular Denies complaints or symptoms of Chest pain. Gastrointestinal Denies complaints or symptoms of Frequent diarrhea, Nausea,  Vomiting. Endocrine Denies complaints or symptoms of Heat/cold intolerance. Genitourinary Denies complaints or symptoms of Frequent urination. Integumentary (Skin) Complains or has symptoms of Wounds. Musculoskeletal Denies complaints or symptoms of Muscle Pain, Muscle Weakness. Neurologic Denies complaints or symptoms of Numbness/parasthesias. Psychiatric Denies complaints or symptoms of Claustrophobia, Suicidal. Objective Constitutional alert and oriented x 3. sitting or standing blood pressure is within target range for patient.. supine blood pressure is within target range for patient.. pulse regular and within target range for patient.Marland Kitchen respirations regular, non-labored and within target range for patient.Marland Kitchen temperature within target range for patient.. Well- nourished and well-hydrated in no acute distress. Vitals Time Taken: 10:42 AM, Height: 70 in, Source: Stated, Weight: 170 lbs, Source: Stated, BMI: 24.4, Temperature: 98.4 F, Pulse: 71 bpm, Respiratory Rate: 18 breaths/min, Blood Pressure: 137/65 mmHg. Eyes conjunctiva clear no eyelid edema noted. pupils equal round and reactive to light and accommodation. Ears, Nose, Mouth, and Throat no gross abnormality of ear auricles or external auditory canals. normal hearing noted during conversation. mucus membranes moist. Neck supple with no LAD noted in anterior or posterior cervical chain. not enlarged. Respiratory normal breathing without difficulty. clear to auscultation bilaterally. Cardiovascular regular rate and rhythm with normal S1, S2. no bruits with no significant JVD. 2+ femoral pulses. 2+ dorsalis pedis/posterior tibialis pulses. no clubbing, cyanosis, significant edema, Gastrointestinal (GI) soft, non-tender, non-distended, +BS. no hepatosplenomegaly. no ventral hernia noted. Musculoskeletal normal gait and posture. no significant deformity or arthritic changes, no loss or range of motion, no clubbing. full range of  motion without deformity. full range of motion without deformity. full range of motion with greater than 10 degrees of flexion of the ankle. full range of motion with greater than 10 degrees of flexion of the ankle. Neurological cranial nerves 2-12 intact. Patient has normal sensation in the feet bilaterally to light touch. Psychiatric this patient is able to make decisions and demonstrates good insight into disease process. Alert and Oriented x 3. pleasant and cooperative. General Notes: Left lower extremity has significant degree of hemosiderin staining, open areas include the left lateral small area and left dorsal foot area that is also very small with skin depth ulceration. There is not much swelling in this leg and he was in an [employer] as of this morning. Integumentary (Hair, Skin) normal hair distribution and pattern. skin pink, warm, dry. Wound #3 status is Open. Original cause of wound was Blister. The wound is located on the Left Lower Leg. The wound measures 0.3cm length x 0.3cm width x 0.1cm depth; 0.071cm^2 area and 0.007cm^3 volume. There is Fat Layer (Subcutaneous Tissue) Exposed exposed. There is no tunneling or  undermining noted. There is a small amount of serosanguineous drainage noted. There is large (67-100%) red granulation within the wound bed. There is no necrotic tissue within the wound bed. Wound #4 status is Open. Original cause of wound was Trauma. The wound is located on the Left,Dorsal Foot. The wound measures 1.5cm length x 1cm width x 0.1cm depth; 1.178cm^2 area and 0.118cm^3 volume. There is Fat Layer (Subcutaneous Tissue) Exposed exposed. There is no tunneling or undermining noted. There is a medium amount of serosanguineous drainage noted. There is large (67-100%) red, pink granulation within the wound bed. There is a small (1- 33%) amount of necrotic tissue within the wound bed including Adherent Slough. Assessment Active Problems ICD-10 Non-pressure chronic  ulcer of other part of left foot with unspecified severity Polycythemia vera Venous insufficiency (chronic) (peripheral) Plan Follow-up Appointments: Return Appointment in 1 week. Dressing Change Frequency: Wound #3 Left Lower Leg: Change dressing three times week. - Two times by [facility] and once at wound care center Wound #4 Left,Dorsal Foot: Change dressing three times week. - Two times by [facility] and once at wound care center Skin Barriers/Peri-Wound Care: Moisturizing lotion Wound Cleansing: May shower and wash wound with soap and water. - on days that dressing is changed Primary Wound Dressing: Wound #3 Left Lower Leg: Silver Collagen - moisten with hydrogel Wound #4 Left,Dorsal Foot: Silver Collagen - moisten with hydrogel Secondary Dressing: Wound #3 Left Lower Leg: ABD pad Wound #4 Left,Dorsal Foot: ABD pad Edema Control: Kerlix and Coban - Left Lower Extremity - use cotton instead of kerlix. Avoid standing for long periods of time Elevate legs to the level of the heart or above for 30 minutes daily and/or when sitting, a frequency of: Exercise regularly Home Health: [facility] for wound care. - [facility] -Patient has great reluctance to go into any form of compression but is agreeable to go to 2 layer Kerlix Coban wrap with Prisma primary dressing -Patient can continue his current medications including Hydrea -These wounds look small and should healing reasonably small amount of time -Patient will return to clinic next week Electronic Signature(s) Signed: 12/12/2019 11:51:48 AM By: Tobi Bastos MD, MBA Previous Signature: 12/12/2019 11:50:24 AM Version By: Tobi Bastos MD, MBA Entered By: Tobi Bastos on 12/12/2019 11:51:47 -------------------------------------------------------------------------------- HxROS Details Patient Name: Date of Service: [name] [name], [name] BERT L. 12/12/2019 10:30 A M Medical Record Number: [id number] Patient Account  Number: [id number] Date of Birth/Sex: Treating RN: [birth year] (86 y.o. Jerilynn Mages) Carlene Coria Primary Care Provider: Tivis Ringer Other Clinician: Referring Provider: Treating Provider/Extender: Fidela Salisbury, Ravisankar R Weeks in Treatment: 0 Information Obtained From Patient Constitutional Symptoms (General Health) Complaints and Symptoms: Negative for: Fatigue; Fever; Chills; Marked Weight Change Eyes Complaints and Symptoms: Negative for: Dry Eyes; Vision Changes; Glasses / Contacts Medical History: Positive for: Cataracts - s/p surgery Negative for: Glaucoma; Optic Neuritis Ear/Nose/Mouth/Throat Complaints and Symptoms: Negative for: Chronic sinus problems or rhinitis Medical History: Negative for: Chronic sinus problems/congestion; Middle ear problems Respiratory Complaints and Symptoms: Negative for: Chronic or frequent coughs; Shortness of [place] Medical History: Negative for: Aspiration; Asthma; Chronic Obstructive Pulmonary Disease (COPD); Pneumothorax; Sleep Apnea; Tuberculosis Cardiovascular Complaints and Symptoms: Negative for: Chest pain Medical History: Positive for: Hypertension Negative for: Angina; Arrhythmia; Congestive Heart Failure; Coronary Artery Disease; Deep Vein Thrombosis; Hypotension; Myocardial Infarction; Peripheral Arterial Disease; Peripheral Venous Disease; Phlebitis; Vasculitis Past Medical History Notes: dyslipidemia , mild aortic stenosis Gastrointestinal Complaints and Symptoms: Negative for: Frequent diarrhea; Nausea; Vomiting  Medical History: Negative for: Cirrhosis ; Colitis; Crohns; Hepatitis A; Hepatitis B; Hepatitis C Endocrine Complaints and Symptoms: Negative for: Heat/cold intolerance Medical History: Negative for: Type I Diabetes; Type II Diabetes Genitourinary Complaints and Symptoms: Negative for: Frequent urination Medical History: Negative for: End Stage Renal Disease Past Medical History  Notes: BPH Integumentary (Skin) Complaints and Symptoms: Positive for: Wounds Medical History: Negative for: History of Burn Musculoskeletal Complaints and Symptoms: Negative for: Muscle Pain; Muscle Weakness Medical History: Negative for: Gout; Rheumatoid Arthritis; Osteoarthritis; Osteomyelitis Past Medical History Notes: degenerative joint disease Neurologic Complaints and Symptoms: Negative for: Numbness/parasthesias Medical History: Negative for: Dementia; Neuropathy; Quadriplegia; Paraplegia; Seizure Disorder Past Medical History Notes: gout , arthritis Psychiatric Complaints and Symptoms: Negative for: Claustrophobia; Suicidal Medical History: Negative for: Anorexia/bulimia; Confinement Anxiety Hematologic/Lymphatic Medical History: Negative for: Anemia; Hemophilia; Human Immunodeficiency Virus; Lymphedema; Sickle Cell Disease Past Medical History Notes: polycythemia vera , bone marrow disease , hx of DVT in bilat legs Immunological Oncologic Medical History: Negative for: Received Chemotherapy; Received Radiation HBO Extended History Items Eyes: Cataracts Immunizations Pneumococcal Vaccine: Received Pneumococcal Vaccination: No Implantable Devices None Family and Social History Cancer: No; Diabetes: No; Heart Disease: No; Hereditary Spherocytosis: No; Hypertension: Yes - Mother; Kidney Disease: No; Lung Disease: No; Seizures: No; Stroke: No; Thyroid Problems: No; Tuberculosis: No; Never smoker; Marital Status - Married; Alcohol Use: Rarely; Drug Use: No History; Caffeine Use: Daily; Financial Concerns: No; Food, Clothing or Shelter Needs: No; Support System Lacking: No; Transportation Concerns: No Engineer, maintenance) Signed: 12/12/2019 4:30:26 PM By: Tobi Bastos MD, MBA Signed: 12/17/2019 4:36:43 PM By: Carlene Coria RN Entered By: Carlene Coria on 12/12/2019  10:50:56 -------------------------------------------------------------------------------- SuperBill Details Patient Name: Date of Service: [name] [name], [name] BERT L. 12/12/2019 Medical Record Number: [id number] Patient Account Number: [id number] Date of Birth/Sex: Treating RN: 1923-08-12 (86 y.o. 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

## 2019-12-17 NOTE — Progress Notes (Signed)
EMRICK, SPIRES ([id number]) Visit Report for 12/12/2019 Allergy List Details Patient Name: Date of Service: [place], [place] 12/12/2019 10:30 A M Medical Record Number: [id number] Patient Account Number: [id number] Date of Birth/Sex: Treating RN: 09-Oct-1923 (86 y.o. Jerilynn Mages) Carlene Coria Primary Care Eshaan Titzer: Tivis Ringer Other Clinician: Referring Arlee Santosuosso: Treating Amela Handley/Extender: Fidela Salisbury, Ravisankar R Weeks in Treatment: 0 Allergies Active Allergies penicillin Severity: Severe colchicine Severity: Severe Allergy Notes Electronic Signature(s) Signed: 12/17/2019 4:36:43 PM By: Carlene Coria RN Entered By: Carlene Coria on 12/12/2019 10:48:25 -------------------------------------------------------------------------------- Arrival Information Details Patient Name: Date of Service: [name], [name] 12/12/2019 10:30 A M Medical Record Number: [id number] Patient Account Number: [id number] Date of Birth/Sex: Treating RN: June 11, 1924 (86 y.o. Jerilynn Mages) Carlene Coria Primary Care Kloe Oates: Tivis Ringer Other Clinician: Referring Azlyn Wingler: Treating Quinita Kostelecky/Extender: Fidela Salisbury, Ravisankar R Weeks in Treatment: 0 Visit Information Patient Arrived: Cane Arrival Time: 10:39 Accompanied By: self Transfer Assistance: None Patient Identification Verified: Yes Secondary Verification Process Completed: Yes Patient Requires Transmission-Based Precautions: No Patient Has Alerts: Yes Patient Alerts: Patient on Blood Thinner History Since Last Visit All ordered tests and consults were completed: No Added or deleted any medications: No Any new allergies or adverse reactions: No Had a fall or experienced change in activities of daily living that may affect risk of falls: No Signs or symptoms of abuse/neglect since last visito No Hospitalized since last visit: No Implantable device outside of the clinic excluding cellular tissue based products placed in the center  since last visit: No Has Dressing in Place as Prescribed: Yes Has Compression in Place as Prescribed: Yes Pain Present Now: No Electronic Signature(s) Signed: 12/17/2019 4:36:43 PM By: Carlene Coria RN Entered By: Carlene Coria on 12/12/2019 10:41:33 -------------------------------------------------------------------------------- Clinic Level of Care Assessment Details Patient Name: Date of Service: [name] 12/12/2019 10:30 A M Medical Record Number: [id number] Patient Account Number: [id number] Date of Birth/Sex: Treating RN: [birth year] (86 y.o. [name] Primary Care Felecity Lemaster: Tivis Ringer Other Clinician: Referring Minerva Bluett: Treating Serenna Deroy/Extender: Fidela Salisbury, Ravisankar R Weeks in Treatment: 0 Clinic Level of Care Assessment Items TOOL 2 Quantity Score X- 1 0 Use when only an EandM is performed on the INITIAL visit ASSESSMENTS - Nursing Assessment / Reassessment X- 1 20 General Physical Exam (combine w/ comprehensive assessment (listed just below) when performed on new pt. evals) X- 1 25 Comprehensive Assessment (HX, ROS, Risk Assessments, Wounds Hx, etc.) ASSESSMENTS - Wound and Skin A ssessment / Reassessment []  - 0 Simple Wound Assessment / Reassessment - one wound X- 2 5 Complex Wound Assessment / Reassessment - multiple wounds []  - 0 Dermatologic / Skin Assessment (not related to wound area) ASSESSMENTS - Ostomy and/or Continence Assessment and Care []  - 0 Incontinence Assessment and Management []  - 0 Ostomy Care Assessment and Management (repouching, etc.) PROCESS - Coordination of Care X - Simple Patient / Family Education for ongoing care 1 15 []  - 0 Complex (extensive) Patient / Family Education for ongoing care X- 1 10 Staff obtains Programmer, systems, Records, T Results / Process Orders est X- 1 10 Staff telephones HHA, Nursing Homes / Clarify orders / etc []  - 0 Routine Transfer to another Facility (non-emergent  condition) []  - 0 Routine Hospital Admission (non-emergent condition) []  - 0 New Admissions / Biomedical engineer / Ordering NPWT Apligraf, etc. , []  - 0 Emergency Hospital Admission (emergent condition) X- 1 10 Simple Discharge Coordination []  - 0 Complex (extensive) Discharge Coordination  PROCESS - Special Needs []  - 0 Pediatric / Minor Patient Management []  - 0 Isolation Patient Management []  - 0 Hearing / Language / Visual special needs []  - 0 Assessment of Community assistance (transportation, D/C planning, etc.) []  - 0 Additional assistance / Altered mentation []  - 0 Support Surface(s) Assessment (bed, cushion, seat, etc.) INTERVENTIONS - Wound Cleansing / Measurement X- 1 5 Wound Imaging (photographs - any number of wounds) []  - 0 Wound Tracing (instead of photographs) []  - 0 Simple Wound Measurement - one wound X- 2 5 Complex Wound Measurement - multiple wounds []  - 0 Simple Wound Cleansing - one wound X- 2 5 Complex Wound Cleansing - multiple wounds INTERVENTIONS - Wound Dressings X - Small Wound Dressing one or multiple wounds 1 10 []  - 0 Medium Wound Dressing one or multiple wounds []  - 0 Large Wound Dressing one or multiple wounds []  - 0 Application of Medications - injection INTERVENTIONS - Miscellaneous []  - 0 External ear exam []  - 0 Specimen Collection (cultures, biopsies, blood, body fluids, etc.) []  - 0 Specimen(s) / Culture(s) sent or taken to Lab for analysis []  - 0 Patient Transfer (multiple staff / Civil Service fast streamer / Similar devices) []  - 0 Simple Staple / Suture removal (25 or less) []  - 0 Complex Staple / Suture removal (26 or more) []  - 0 Hypo / Hyperglycemic Management (close monitor of Blood Glucose) X- 1 15 Ankle / Brachial Index (ABI) - do not check if billed separately Has the patient been seen at the hospital within the last three years: Yes Total Score: 150 Level Of Care: New/Established - Level 4 Electronic  Signature(s) Signed: 12/12/2019 4:59:31 PM By: Kela Millin Entered By: Kela Millin on 12/12/2019 11:43:16 -------------------------------------------------------------------------------- Encounter Discharge Information Details Patient Name: Date of Service: [name], [name] 12/12/2019 10:30 A M Medical Record Number: [id number] Patient Account Number: [id number] Date of Birth/Sex: Treating RN: 09-Mar-1924 (86 y.o. Ernestene Mention Primary Care Sher Shampine: Tivis Ringer Other Clinician: Referring Leana Springston: Treating Tessica Cupo/Extender: Nani Ravens Weeks in Treatment: 0 Encounter Discharge Information Items Discharge Condition: Stable Ambulatory Status: Cane Discharge Destination: Home Transportation: Private Auto Accompanied By: self Schedule Follow-up Appointment: Yes Clinical Summary of Care: Patient Declined Electronic Signature(s) Signed: 12/12/2019 4:59:25 PM By: Baruch Gouty RN, BSN Entered By: Baruch Gouty on 12/12/2019 11:59:34 -------------------------------------------------------------------------------- Lower Extremity Assessment Details Patient Name: Date of Service: [name], [place] BERT L. 12/12/2019 10:30 A M Medical Record Number: [id number] Patient Account Number: [id number] Date of Birth/Sex: Treating RN: 03-Jun-1924 (86 y.o. Jerilynn Mages) Carlene Coria Primary Care Aldon Hengst: Tivis Ringer Other Clinician: Referring Serene Kopf: Treating Earlyne Feeser/Extender: Fidela Salisbury, Ravisankar R Weeks in Treatment: 0 Edema Assessment Assessed: [Left: No] [Right: No] E[Left: dema] [Right: :] Calf Left: Right: Point of Measurement: 38 cm From Medial Instep 32 cm cm Ankle Left: Right: Point of Measurement: 10 cm From Medial Instep 20 cm cm Vascular Assessment Blood Pressure: Brachial: [Left:137] Ankle: [Left:Dorsalis Pedis: 174 1.27] Electronic Signature(s) Signed: 12/17/2019 4:36:43 PM By: Carlene Coria RN Entered By: Carlene Coria  on 12/12/2019 11:13:56 -------------------------------------------------------------------------------- [place] Details Patient Name: Date of Service: [name], [name] 12/12/2019 10:30 A M Medical Record Number: [id number] Patient Account Number: [id number] Date of Birth/Sex: Treating RN: 1923-09-11 (86 y.o. [name] Primary Care Taelar Gronewold: Tivis Ringer Other Clinician: Referring Jesstin Studstill: Treating Johnavon Mcclafferty/Extender: Fidela Salisbury, Ravisankar R Weeks in Treatment: 0 Active Inactive Pain, Acute or Chronic Nursing Diagnoses: Pain, acute or  chronic: actual or potential Goals: Patient/caregiver will verbalize adequate pain control between visits Date Initiated: 12/12/2019 Target Resolution Date: 01/09/2020 Goal Status: Active Interventions: Provide education on pain management Notes: Venous Leg Ulcer Nursing Diagnoses: Actual venous Insuffiency (use after diagnosis is confirmed) Goals: Patient/caregiver will verbalize understanding of disease process and disease management Date Initiated: 12/12/2019 Target Resolution Date: 01/09/2020 Goal Status: Active Interventions: Compression as ordered Provide education on venous insufficiency Notes: Wound/Skin Impairment Nursing Diagnoses: Impaired tissue integrity Goals: Ulcer/skin breakdown will have a volume reduction of 30% by week 4 Date Initiated: 12/12/2019 Target Resolution Date: 01/09/2020 Goal Status: Active Interventions: Provide education on ulcer and skin care Notes: Electronic Signature(s) Signed: 12/12/2019 4:59:31 PM By: Kela Millin Entered By: Kela Millin on 12/12/2019 11:37:29 -------------------------------------------------------------------------------- Pain Assessment Details Patient Name: Date of Service: [name], [name] 12/12/2019 10:30 A M Medical Record Number: [id number] Patient Account Number: [id number] Date of Birth/Sex: Treating  RN: Nov 09, 1923 (86 y.o. Jerilynn Mages) Carlene Coria Primary Care Daeshaun Specht: Tivis Ringer Other Clinician: Referring Nayel Purdy: Treating Berniece Abid/Extender: Fidela Salisbury, Ravisankar R Weeks in Treatment: 0 Active Problems Location of Pain Severity and Description of Pain Patient Has Paino Yes Site Locations With Dressing Change: Yes Duration of the Pain. Constant / Intermittento Intermittent How Long Does it Lasto Hours: Minutes: 15 Rate the pain. Current Pain Level: 2 Worst Pain Level: 4 Least Pain Level: 0 Tolerable Pain Level: 5 Character of Pain Describe the Pain: Sharp, Shooting Pain Management and Medication Current Pain Management: Medication: Yes Cold Application: No Rest: Yes Massage: No Activity: No T.E.N.S.: No Heat Application: No Leg drop or elevation: No Is the Current Pain Management Adequate: Inadequate How does your wound impact your activities of daily livingo Sleep: Yes Bathing: No Appetite: Yes Relationship With Others: No Bladder Continence: No Emotions: No Bowel Continence: No Work: No Toileting: No Drive: No Dressing: No Hobbies: No Electronic Signature(s) Signed: 12/17/2019 4:36:43 PM By: Carlene Coria RN Entered By: Carlene Coria on 12/12/2019 11:19:50 -------------------------------------------------------------------------------- Patient/Caregiver Education Details Patient Name: Date of Service: [name], Jethro Bolus 5/28/2021andnbsp10:30 A M Medical Record Number: [id number] Patient Account Number: [id number] Date of Birth/Gender: Treating RN: 05-05-1924 (86 y.o. [name] Primary Care Physician: Tivis Ringer Other Clinician: Referring Physician: Treating Physician/Extender: Nani Ravens Weeks in Treatment: 0 Education Assessment Education Provided To: Patient Education Topics Provided Pain: Handouts: A Guide to Pain Control Methods: Explain/Verbal Responses: State content  correctly Venous: Handouts: Controlling Swelling with Compression Stockings Methods: Explain/Verbal Responses: State content correctly Wound/Skin Impairment: Handouts: Caring for Your Ulcer Methods: Explain/Verbal Responses: State content correctly Electronic Signature(s) Signed: 12/12/2019 4:59:31 PM By: Kela Millin Entered By: Kela Millin on 12/12/2019 11:37:48 -------------------------------------------------------------------------------- Wound Assessment Details Patient Name: Date of Service: [name], [name] 12/12/2019 10:30 A M Medical Record Number: [id number] Patient Account Number: [id number] Date of Birth/Sex: Treating RN: Mar 17, 1924 (86 y.o. Jerilynn Mages) Carlene Coria Primary Care Keron Neenan: Other Clinician: Tivis Ringer Referring Hanad Leino: Treating Alixandrea Milleson/Extender: Fidela Salisbury, Ravisankar R Weeks in Treatment: 0 Wound Status Wound Number: 3 Primary Etiology: Venous Leg Ulcer Wound Location: Left Lower Leg Wound Status: Open Wounding Event: Blister Comorbid History: Cataracts, Hypertension Date Acquired: 10/22/2019 Weeks Of Treatment: 0 Clustered Wound: No Photos Photo Uploaded By: Mikeal Hawthorne on 12/16/2019 10:19:40 Wound Measurements Length: (cm) 0.3 Width: (cm) 0.3 Depth: (cm) 0.1 Area: (cm) 0.071 Volume: (cm) 0.007 % Reduction in Area: % Reduction in Volume: Epithelialization: None Tunneling: No Undermining: No Wound Description Classification: Full  Thickness Without Exposed Support Structures Exudate Amount: Small Exudate Type: Serosanguineous Exudate Color: red, brown Foul Odor After Cleansing: No Slough/Fibrino No Wound Bed Granulation Amount: Large (67-100%) Exposed Structure Granulation Quality: Red Fascia Exposed: No Necrotic Amount: None Present (0%) Fat Layer (Subcutaneous Tissue) Exposed: Yes Tendon Exposed: No Muscle Exposed: No Joint Exposed: No Bone Exposed: No Treatment Notes Wound #3 (Left Lower  Leg) 2. Periwound Care Moisturizing lotion 3. Primary Dressing Applied Collegen AG 4. Secondary Dressing Dry Gauze 6. Support Layer Holiday representative) Signed: 12/17/2019 4:36:43 PM By: Carlene Coria RN Entered By: Carlene Coria on 12/12/2019 11:15:59 -------------------------------------------------------------------------------- Wound Assessment Details Patient Name: Date of Service: [name], Jethro Bolus 12/12/2019 10:30 A M Medical Record Number: [id number] Patient Account Number: [id number] Date of Birth/Sex: Treating RN: Jun 16, 1924 (86 y.o. Jerilynn Mages) Carlene Coria Primary Care Hanifah Royse: Tivis Ringer Other Clinician: Referring Lynze Reddy: Treating Janicia Monterrosa/Extender: Fidela Salisbury, Ravisankar R Weeks in Treatment: 0 Wound Status Wound Number: 4 Primary Etiology: Lesion Wound Location: Left, Dorsal Foot Wound Status: Open Wounding Event: Trauma Comorbid History: Cataracts, Hypertension Date Acquired: 11/26/2019 Weeks Of Treatment: 0 Clustered Wound: No Photos Photo Uploaded By: Mikeal Hawthorne on 12/16/2019 10:20:01 Wound Measurements Length: (cm) 1.5 Width: (cm) 1 Depth: (cm) 0.1 Area: (cm) 1.178 Volume: (cm) 0.118 % Reduction in Area: % Reduction in Volume: Epithelialization: None Tunneling: No Undermining: No Wound Description Classification: Full Thickness Without Exposed Support Structu Exudate Amount: Medium Exudate Type: Serosanguineous Exudate Color: red, brown res Foul Odor After Cleansing: No Slough/Fibrino Yes Wound Bed Granulation Amount: Large (67-100%) Exposed Structure Granulation Quality: Red, Pink Fascia Exposed: No Necrotic Amount: Small (1-33%) Fat Layer (Subcutaneous Tissue) Exposed: Yes Necrotic Quality: Adherent Slough Tendon Exposed: No Muscle Exposed: No Joint Exposed: No Bone Exposed: No Treatment Notes Wound #4 (Left, Dorsal Foot) 2. Periwound Care Moisturizing lotion 3. Primary Dressing  Applied Collegen AG 4. Secondary Dressing Dry Gauze 6. Support Layer Holiday representative) Signed: 12/17/2019 4:36:43 PM By: Carlene Coria RN Entered By: Carlene Coria on 12/12/2019 11:18:09 -------------------------------------------------------------------------------- Vitals Details Patient Name: Date of Service: [name], [name] 12/12/2019 10:30 A M Medical Record Number: [id number] Patient Account Number: [id number] Date of Birth/Sex: Treating RN: 03-28-1924 (86 y.o. Jerilynn Mages) Carlene Coria Primary Care Tayari Yankee: Tivis Ringer Other Clinician: Referring Zyier Dykema: Treating Trudie Cervantes/Extender: Fidela Salisbury, Ravisankar R Weeks in Treatment: 0 Vital Signs Time Taken: 10:42 Temperature (F): 98.4 Height (in): 70 Pulse (bpm): 71 Source: Stated Respiratory Rate (breaths/min): 18 Weight (lbs): 170 Blood Pressure (mmHg): 137/65 Source: Stated Reference Range: 80 - 120 mg / dl Body Mass Index (BMI): 24.4 Electronic Signature(s) Signed: 12/17/2019 4:36:43 PM By: Carlene Coria RN Entered By: Carlene Coria on 12/12/2019 10:43:48

## 2019-12-17 NOTE — Progress Notes (Signed)
[name] [name] ([id number]) Visit Report for 12/12/2019 Abuse/Suicide Risk Screen Details Patient Name: Date of Service: [place], [place] 12/12/2019 10:30 A M Medical Record Number: [id number] Patient Account Number: [id number] Date of Birth/Sex: Treating RN: 1923/10/23 (86 y.o. [name] [name]) Carlene Coria Primary Care Wanya Bangura: Tivis Ringer Other Clinician: Referring Korryn Pancoast: Treating Hanna Ra/Extender: Fidela Salisbury, Ravisankar R Weeks in Treatment: 0 Abuse/Suicide Risk Screen Items Answer ABUSE RISK SCREEN: Has anyone close to you tried to hurt or harm you recentlyo No Do you feel uncomfortable with anyone in your familyo No Has anyone forced you do things that you didnt want to doo No Electronic Signature(s) Signed: 12/17/2019 4:36:43 PM By: Carlene Coria RN Entered By: Carlene Coria on 12/12/2019 10:51:20 -------------------------------------------------------------------------------- Activities of Daily Living Details Patient Name: Date of Service: [name] [name] 12/12/2019 10:30 A M Medical Record Number: [id number] Patient Account Number: [id number] Date of Birth/Sex: Treating RN: August 21, 1923 (86 y.o. [name] [name]) Carlene Coria Primary Care Anaiya Wisinski: Tivis Ringer Other Clinician: Referring Linton Stolp: Treating Eldra Word/Extender: Fidela Salisbury, Ravisankar R Weeks in Treatment: 0 Activities of Daily Living Items Answer Activities of Daily Living (Please select one for each item) Drive Automobile Not Able T Medications ake Completely Able Use T elephone Completely Able Care for Appearance Need Assistance Use T oilet Need Assistance Bath / Shower Need Assistance Dress Self Need Assistance Feed Self Need Assistance Walk Completely Able Get In / Out Bed Need Assistance Housework Not Able Prepare Meals Not Able Handle Money Completely Able Shop for Self Need Assistance Electronic Signature(s) Signed: 12/17/2019 4:36:43 PM By: Carlene Coria RN Entered By: Carlene Coria on 12/12/2019 10:52:28 -------------------------------------------------------------------------------- Education Screening Details Patient Name: Date of Service: [name], [name] 12/12/2019 10:30 A M Medical Record Number: [id number] Patient Account Number: [id number] Date of Birth/Sex: Treating RN: [birth year] (86 y.o. [name] [name]) Carlene Coria Primary Care [name] [name]: Tivis Ringer Other Clinician: Referring Lakshmi Sundeen: Treating Chayce Robbins/Extender: Fidela Salisbury, Ravisankar R Weeks in Treatment: 0 Primary Learner Assessed: Patient Learning Preferences/Education Level/Primary Language Learning Preference: Explanation Highest Education Level: College or Above Preferred Language: English Cognitive Barrier Language Barrier: No Translator Needed: No Memory Deficit: No Emotional Barrier: No Cultural/Religious Beliefs Affecting Medical Care: No Physical Barrier Impaired Vision: Yes Glasses Impaired Hearing: No Decreased Hand dexterity: No Knowledge/Comprehension Knowledge Level: Medium Comprehension Level: High Ability to understand written instructions: High Ability to understand verbal instructions: High Motivation Anxiety Level: Calm Cooperation: Cooperative Education Importance: Acknowledges Need Interest in Health Problems: Asks Questions Perception: Coherent Willingness to Engage in Self-Management High Activities: Readiness to Engage in Self-Management High Activities: Electronic Signature(s) Signed: 12/17/2019 4:36:43 PM By: Carlene Coria RN Entered By: Carlene Coria on 12/12/2019 10:54:14 -------------------------------------------------------------------------------- Fall Risk Assessment Details Patient Name: Date of Service: [name] [name], [name] 12/12/2019 10:30 A M Medical Record Number: [id number] Patient Account Number: [id number] Date of Birth/Sex: Treating RN: 01-12-1924 (86 y.o. [name] [name]) Carlene Coria Primary Care Marcha Licklider: Tivis Ringer Other  Clinician: Referring Shacoya Burkhammer: Treating Dacoda Finlay/Extender: Fidela Salisbury, Ravisankar R Weeks in Treatment: 0 Fall Risk Assessment Items Have you had 2 or more falls in the last 12 monthso 0 No Have you had any fall that resulted in injury in the last 12 monthso 0 No FALLS RISK SCREEN History of falling - immediate or within 3 months 0 No Secondary diagnosis (Do you have 2 or more medical diagnoseso) 0 No Ambulatory aid None/bed rest/wheelchair/nurse 0 No Crutches/cane/walker 0 No Furniture 0 No Intravenous therapy Access/Saline/Heparin Lock 0  No Gait/Transferring Normal/ bed rest/ wheelchair 0 No Weak (short steps with or without shuffle, stooped but able to lift head while walking, may seek 0 No support from furniture) Impaired (short steps with shuffle, may have difficulty arising from chair, head down, impaired 0 No balance) Mental Status Oriented to own ability 0 No Electronic Signature(s) Signed: 12/17/2019 4:36:43 PM By: Carlene Coria RN Entered By: Carlene Coria on 12/12/2019 10:54:39 -------------------------------------------------------------------------------- Foot Assessment Details Patient Name: Date of Service: [name] [name], [name] 12/12/2019 10:30 A M Medical Record Number: [id number] Patient Account Number: [id number] Date of Birth/Sex: Treating RN: Nov 24, 1923 (86 y.o. [name] [name]) Carlene Coria Primary Care Shaliah Wann: Tivis Ringer Other Clinician: Referring Frisco Cordts: Treating Allyssia Skluzacek/Extender: Fidela Salisbury, Ravisankar R Weeks in Treatment: 0 Foot Assessment Items Site Locations + = Sensation present, - = Sensation absent, C = Callus, U = Ulcer R = Redness, W = Warmth, M = Maceration, PU = Pre-ulcerative lesion F = Fissure, S = Swelling, D = Dryness Assessment Right: Left: Other Deformity: No No Prior Foot Ulcer: No No Prior Amputation: No No Charcot Joint: No No Ambulatory Status: Ambulatory Without Help Gait: Unsteady Electronic  Signature(s) Signed: 12/17/2019 4:36:43 PM By: Carlene Coria RN Entered By: Carlene Coria on 12/12/2019 11:04:42 -------------------------------------------------------------------------------- Nutrition Risk Screening Details Patient Name: Date of Service: [name] [name] 12/12/2019 10:30 A M Medical Record Number: [id number] Patient Account Number: [id number] Date of Birth/Sex: Treating RN: 06/12/1924 (86 y.o. [name] [name]) Carlene Coria Primary Care Patra Gherardi: Tivis Ringer Other Clinician: Referring Kaira Stringfield: Treating Al Bracewell/Extender: Fidela Salisbury, Ravisankar R Weeks in Treatment: 0 Height (in): 70 Weight (lbs): 170 Body Mass Index (BMI): 24.4 Nutrition Risk Screening Items Score Screening NUTRITION RISK SCREEN: I have an illness or condition that made me change the kind and/or amount of food I eat 0 No I eat fewer than two meals per day 0 No I eat few fruits and vegetables, or milk products 0 No I have three or more drinks of beer, liquor or wine almost every day 0 No I have tooth or mouth problems that make it hard for me to eat 0 No I don't always have enough money to buy the food I need 0 No I eat alone most of the time 0 No I take three or more different prescribed or over-the-counter drugs a day 1 Yes Without wanting to, I have lost or gained 10 pounds in the last six months 0 No I am not always physically able to shop, cook and/or feed myself 2 Yes Nutrition Protocols Good Risk Protocol Moderate Risk Protocol 0 Provide education on nutrition High Risk Proctocol Risk Level: Moderate Risk Score: 3 Electronic Signature(s) Signed: 12/17/2019 4:36:43 PM By: Carlene Coria RN Entered By: Carlene Coria on 12/12/2019 10:55:10

## 2019-12-18 ENCOUNTER — Encounter (HOSPITAL_BASED_OUTPATIENT_CLINIC_OR_DEPARTMENT_OTHER): Payer: Medicare Other | Admitting: Internal Medicine

## 2019-12-19 ENCOUNTER — Encounter (HOSPITAL_BASED_OUTPATIENT_CLINIC_OR_DEPARTMENT_OTHER): Payer: Medicare Other | Attending: Internal Medicine | Admitting: Internal Medicine

## 2019-12-19 DIAGNOSIS — Z7901 Long term (current) use of anticoagulants: Secondary | ICD-10-CM | POA: Diagnosis not present

## 2019-12-19 DIAGNOSIS — W228XXA Striking against or struck by other objects, initial encounter: Secondary | ICD-10-CM | POA: Diagnosis not present

## 2019-12-19 DIAGNOSIS — S91002A Unspecified open wound, left ankle, initial encounter: Secondary | ICD-10-CM | POA: Insufficient documentation

## 2019-12-19 DIAGNOSIS — D45 Polycythemia vera: Secondary | ICD-10-CM | POA: Diagnosis not present

## 2019-12-19 DIAGNOSIS — L97528 Non-pressure chronic ulcer of other part of left foot with other specified severity: Secondary | ICD-10-CM | POA: Diagnosis not present

## 2019-12-19 DIAGNOSIS — L97522 Non-pressure chronic ulcer of other part of left foot with fat layer exposed: Secondary | ICD-10-CM | POA: Diagnosis present

## 2019-12-19 DIAGNOSIS — I10 Essential (primary) hypertension: Secondary | ICD-10-CM | POA: Diagnosis not present

## 2019-12-19 DIAGNOSIS — I872 Venous insufficiency (chronic) (peripheral): Secondary | ICD-10-CM | POA: Insufficient documentation

## 2019-12-22 NOTE — Progress Notes (Signed)
[name], [name] ([id number]) Visit Report for 12/19/2019 HPI Details Patient Name: Date of Service: [place], [place] 12/19/2019 10:30 A M Medical Record Number: [id number] Patient Account Number: [id number] Date of Birth/Sex: Treating RN: 1924/02/25 (86 y.o. [name] [name] Primary Care Provider: Tivis [name] Other Clinician: Referring Provider: Treating Provider/Extender: [name] [name] in Treatment: 1 History of Present Illness HPI Description: ............................ FROM our [place] [facility] History of Present Illness (HPI) The following HPI elements were documented for the patient's wound: Location: noticed an ulcer on the left ankle about a month Quality: Patient reports experiencing a dull pain to affected area(s). Severity: Patient states wound are getting worse. Duration: Patient has had the wound for < 4 [name] prior to presenting for treatment Timing: Pain in wound is Intermittent (comes and goes Context: The wound appeared gradually over time Modifying Factors: Other treatment(s) tried include:doxycycline on 2 different occasions and a light compression wrap Associated Signs and Symptoms: Patient reports having increase swelling. 86 year old gentleman with a past medical history of hypertension, polycythemia, diverticulitis, left lower extremity venous ulcer. He has never been a smoker. As recently seen by his PCP Dr. Dagmar [name], for venous stasis edema with ulcer on the left lower extremity and has completed the second course of doxycycline. Is also advised duoderm and wrapping the leg with Kling and Coban. 03/16/2016 -- not yet received his vascular appointment for venous duplex studies. Due to his original wound he now had a blister which has opened up to a superficial ulceration ADMISSION/Transfer 03/23/16; patient has reflux studies booked for September 20. He has been using silver alginate under Profore lite wrap 04/05/2016 -- he had  his venous duplex study done earlier today and I understand he has an appointment to see the surgeon on Friday. 04/12/2016 -- venous duplex study done on 04/05/2016 showed chronic deep vein thrombosis in the left femoral vein with the left greater saphenous vein reflux of more than 500 ms noted from the saphenofemoral junction to below the knee. Tortuosity is noted. He was then seen by Dr. Siri [name] at [facility] vascular whose opinion was that he had C6 venous disease with significant greater saphenous vein reflux and skin changes. He recommended the patient see Dr. Kellie [name] in the vein clinic in 1 month's time for consideration of great saphenous vein ablation on the left. 04/19/16; patient's wounds on his medial left leg are much better. He has what appears to be a small dime-sized blister on the left anterior leg. This is new from last week. He has been under compression. 04/25/16; his original wounds are all resolved. The blister that he had on the left anterior leg is epithelialized. Some discoloration underneath the tan-brown color. The patient states that he did not have any skin lesion here before although I think this needs to be watched and I told the patient this. 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 Patient was in the wound clinic many years ago for left lower extremity wounds but has not been recently in the wound clinic for any reason, the original wound for which he required  Unna boot compression was due to his dropping an iPad on his left ankle-this has healed however Patient's  history significant for bilateral DVT for which she is on Xarelto, venous stasis and hemosiderin staining on his left leg Polycythemia vera for which he is on every 36-month phlebotomy routine. Patient is also on Hydrea 12/19/2019; this patient we have seen previously in this clinic with severe bilateral chronic venous insufficiency. He is very resistant to the notion of compression or stockings. I think last week they had to talk him into a kerlix Coban wrap. He has an area on his left dorsal foot and a small area on the left lateral lower leg just above the ankle. Electronic Signature(s) Signed: 12/22/2019 5:22:40 PM By: Linton Ham MD Entered By: Linton Ham on 12/19/2019 13:08:36 -------------------------------------------------------------------------------- Physical Exam Details Patient Name: Date of Service: [name] [name], [name] BERT L. 12/19/2019 10:30 A M Medical Record Number: [id number] Patient Account Number: [id number] Date of Birth/Sex: Treating RN: 1923-12-07 (86 y.o. [name] [name] Primary Care Provider: Tivis [name] Other Clinician: Referring Provider: Treating Provider/Extender: [name] [name] in Treatment: 1 Constitutional Sitting or standing Blood Pressure is within target range for patient.. Pulse regular and within target range for patient.Marland Kitchen Respirations regular, non-labored and within target range.. Temperature is normal and within the target range for the patient.Marland Kitchen Appears in no distress. Cardiovascular Pedal pulses are palpable. Marginal edema control. Notes Wound exam; considerable damage from chronic venous insufficiency with hemosiderin staining that extends into his dorsal foot. He has marginal edema control today. The area on the dorsal foot is the most substantial wound although it appears clean. There is no need for debridement a very tiny area remains on the left lateral lower leg. Electronic Signature(s) Signed: 12/22/2019  5:22:40 PM By: Linton Ham MD Entered By: Linton Ham on 12/19/2019 13:09:41 -------------------------------------------------------------------------------- Physician Orders Details Patient Name: Date of Service: [name], [name] BERT L. 12/19/2019 10:30 A M Medical Record Number: [id number] Patient Account Number: [id number] Date of Birth/Sex: Treating RN: 26-Nov-1923 (86 y.o. [name] [name] Primary Care Provider: Tivis [name] Other Clinician: Referring Provider: Treating Provider/Extender: April Holding in Treatment: 1 Verbal / Phone Orders: No Diagnosis Coding ICD-10 Coding Code Description L97.529 Non-pressure chronic ulcer of other part of left foot with unspecified severity D45 Polycythemia vera I87.2 Venous insufficiency (chronic) (peripheral) Follow-up Appointments Return Appointment in 2 [name]. Dressing Change Frequency Wound #4 Left,Dorsal Foot Change dressing three times week. - Three times by [facility] next week Skin Barriers/Peri-Wound Care Moisturizing lotion Wound Cleansing May shower and wash wound with soap and water. - on days that dressing is changed Primary Wound Dressing Wound #3 Left,Lateral Lower Leg Silver Collagen - moisten with hydrogel Wound #4 Left,Dorsal Foot Silver Collagen - moisten with hydrogel Secondary Dressing Wound #3 Left,Lateral Lower Leg Dry Gauze Wound #4 Left,Dorsal Foot ABD pad Edema Control Kerlix and Coban - Left Lower Extremity - cotton instead of kerlix Avoid standing for long periods of time Elevate legs to the level of the heart or above for 30 minutes daily and/or when sitting, a frequency of: Exercise regularly [facility] for wound care. - [facility] Electronic Signature(s) Signed: 12/19/2019 4:23:17 PM By: Kela Millin Signed: 12/22/2019 5:22:40 PM By: Linton Ham MD Entered By: Kela Millin on 12/19/2019  12:28:55 -------------------------------------------------------------------------------- Problem List Details Patient Name: Date of Service: [name] [name], [name] BERT L. 12/19/2019 10:30 A M Medical Record Number: [id number] Patient Account  Number: [id number] Date of Birth/Sex: Treating RN: 1924-04-13 (86 y.o. [name] [name] Primary Care Provider: Tivis [name] Other Clinician: Referring Provider: Treating Provider/Extender: Langley Gauss, Pilar Plate [name] in Treatment: 1 Active Problems ICD-10 Encounter Code Description Active Date MDM Diagnosis L97.529 Non-pressure chronic ulcer of other part of left foot with unspecified severity 12/12/2019 No Yes D45 Polycythemia vera 12/12/2019 No Yes I87.2 Venous insufficiency (chronic) (peripheral) 12/12/2019 No Yes Inactive Problems Resolved Problems Electronic Signature(s) Signed: 12/22/2019 5:22:40 PM By: Linton Ham MD Entered By: Linton Ham on 12/19/2019 13:06:11 -------------------------------------------------------------------------------- Progress Note Details Patient Name: Date of Service: [name] [name], [name] BERT L. 12/19/2019 10:30 A M Medical Record Number: [id number] Patient Account Number: [id number] Date of Birth/Sex: Treating RN: 16-Jul-1924 (86 y.o. [name] [name] Primary Care Provider: Tivis [name] Other Clinician: Referring Provider: Treating Provider/Extender: [name] [name] in Treatment: 1 Subjective History of Present Illness (HPI) ............................ FROM our [place] clinic History of Present Illness (HPI) The following HPI elements were documented for the patient's wound: Location: noticed an ulcer on the left ankle about a month Quality: Patient reports experiencing a dull pain to affected area(s). Severity: Patient states wound are getting worse. Duration: Patient has had the wound for < 4 [name] prior to presenting for treatment Timing: Pain in wound  is Intermittent (comes and goes Context: The wound appeared gradually over time Modifying Factors: Other treatment(s) tried include:doxycycline on 2 different occasions and a light compression wrap Associated Signs and Symptoms: Patient reports having increase swelling. 86 year old gentleman with a past medical history of hypertension, polycythemia, diverticulitis, left lower extremity venous ulcer. He has never been a smoker. As recently seen by his PCP Dr. Dagmar [name], for venous stasis edema with ulcer on the left lower extremity and has completed the second course of doxycycline. Is also advised duoderm and wrapping the leg with Kling and Coban. 03/16/2016 -- not yet received his vascular appointment for venous duplex studies. Due to his original wound he now had a blister which has opened up to a superficial ulceration ADMISSION/Transfer 03/23/16; patient has reflux studies booked for September 20. He has been using silver alginate under Profore lite wrap 04/05/2016 -- he had his venous duplex study done earlier today and I understand he has an appointment to see the surgeon on Friday. 04/12/2016 -- venous duplex study done on 04/05/2016 showed chronic deep vein thrombosis in the left femoral vein with the left greater saphenous vein reflux of more than 500 ms noted from the saphenofemoral junction to below the knee. Tortuosity is noted. He was then seen by Dr. Siri [name] at vein and vascular whose opinion was that he had C6 venous disease with significant greater saphenous vein reflux and skin changes. He recommended the patient see Dr. Kellie [name] in the vein clinic in 1 month's time for consideration of great saphenous vein ablation on the left. 04/19/16; patient's wounds on his medial left leg are much better. He has what appears to be a small dime-sized blister on the left anterior leg. This is new from last week. He has been under compression. 04/25/16; his original wounds are all resolved. The  blister that he had on the left anterior leg is epithelialized. Some discoloration underneath the tan-brown color. The patient states that he did not have any skin lesion here before although I think this needs to be watched and I told the patient this. 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 Patient was in the wound clinic many years ago for left lower extremity wounds but has not been recently in the wound clinic for any reason, the original wound for which he required Unna boot compression was due to his dropping an iPad on his left ankle-this has healed however Patient's history significant for bilateral DVT for which she is on Xarelto, venous stasis and hemosiderin staining on his left leg Polycythemia vera for which he is on every 21-month phlebotomy routine. Patient is also on Hydrea 12/19/2019; this patient we have seen previously in this clinic with severe bilateral chronic venous insufficiency. He is very resistant to the notion of compression or stockings. I think last week they had to talk him into a kerlix Coban wrap. He has an area on his left dorsal foot and a small area on the left lateral lower leg just above the ankle. Objective Constitutional Sitting or standing Blood Pressure is within target range for patient.. Pulse regular and within target range for patient.Marland Kitchen Respirations regular, non-labored and within target range.. Temperature is normal and within the target range for the patient.Marland Kitchen Appears in no distress. Vitals Time Taken: 11:46 AM, Height: 70 in, Weight: 170 lbs, BMI:  24.4, Temperature: 98.5 F, Pulse: 76 bpm, Respiratory Rate: 18 breaths/min, Blood Pressure: 129/78 mmHg. Cardiovascular Pedal pulses are palpable. Marginal edema control. General Notes: Wound exam; considerable damage from chronic venous insufficiency with hemosiderin staining that extends into his dorsal foot. He has marginal edema control today. The area on the dorsal foot is the most substantial wound although it appears clean. There is no need for debridement a very tiny area remains on the left lateral lower leg. Integumentary (Hair, Skin) Wound #3 status is Open. Original cause of wound was Blister. The wound is located on the Left,Lateral Lower Leg. The wound measures 0.3cm length x 0.3cm width x 0.1cm depth; 0.071cm^2 area and 0.007cm^3 volume. There is Fat Layer (Subcutaneous Tissue) Exposed exposed. There is no tunneling or undermining noted. There is a small amount of serous drainage noted. The wound margin is distinct with the outline attached to the wound base. There is large (67-100%) pink granulation within the wound bed. There is no necrotic tissue within the wound bed. Wound #4 status is Open. Original cause of wound was Trauma. The wound is located on the Left,Dorsal Foot. The wound measures 2cm length x 0.8cm width x 0.1cm depth; 1.257cm^2 area and 0.126cm^3 volume. There is Fat Layer (Subcutaneous Tissue) Exposed exposed. There is no tunneling or undermining noted. There is a small amount of serosanguineous drainage noted. The wound margin is flat and intact. There is large (67-100%) red granulation within the wound bed. There is no necrotic tissue within the wound bed. Assessment Active Problems ICD-10 Non-pressure chronic ulcer of other part of left foot with unspecified severity Polycythemia vera Venous insufficiency (chronic) (peripheral) Plan Follow-up Appointments: Return Appointment in 2 [name]. Dressing Change Frequency: Wound #4 Left,Dorsal Foot: Change  dressing three times week. - Three times by [facility] next week Skin Barriers/Peri-Wound Care: Moisturizing lotion Wound Cleansing: May shower and wash wound with soap and water. - on days that dressing is changed Primary Wound Dressing:  Wound #3 Left,Lateral Lower Leg: Silver Collagen - moisten with hydrogel Wound #4 Left,Dorsal Foot: Silver Collagen - moisten with hydrogel Secondary Dressing: Wound #3 Left,Lateral Lower Leg: Dry Gauze Wound #4 Left,Dorsal Foot: ABD pad Edema Control: Kerlix and Coban - Left Lower Extremity - cotton instead of kerlix Avoid standing for long periods of time Elevate legs to the level of the heart or above for 30 minutes daily and/or when sitting, a frequency of: Exercise regularly Home Health: [facility] for wound care. - [facility] 1. We continue with silver collagen to both wound areas under kerlix Coban wrap 2. I tried to talk him into a 3 layer compression but he would not agree to it. He states that the [employer] he was having applied before he came to see [place] was causing breakdown in his skin. 3. I did talk to him about compression stockings he is really not interested stating that he finds them too painful. Furthermore there is not a way to get these on 4. Fortunately his wound on the left lower leg is just about closed the area on the left dorsal foot is smaller. Electronic Signature(s) Signed: 12/22/2019 5:22:40 PM By: Linton Ham MD Entered By: Linton Ham on 12/19/2019 13:12:21 -------------------------------------------------------------------------------- SuperBill Details Patient Name: Date of Service: [name] [name], [name] BERT L. 12/19/2019 Medical Record Number: [id number] Patient Account Number: [id number] Date of Birth/Sex: Treating RN: 1924/05/24 (86 y.o. [name] [name] Primary Care Provider: Tivis [name] Other Clinician: Referring Provider: Treating Provider/Extender: [name] [name] in Treatment: 1 Diagnosis Coding ICD-10 Codes Code Description [phone] Non-pressure chronic ulcer of other part of left foot with unspecified severity D45 Polycythemia vera I87.2 Venous insufficiency (chronic) (peripheral) Facility Procedures CPT4 Code: [id number] Description: 99213 - WOUND CARE VISIT-LEV 3 EST PT Modifier: Quantity: 1 Physician Procedures : CPT4 Code Description Modifier [id number] - WC PHYS LEVEL 3 - EST PT ICD-10 Diagnosis Description L97.529 Non-pressure chronic ulcer of other part of left foot with unspecified severity D45 Polycythemia vera I87.2 Venous insufficiency (chronic)  (peripheral) Quantity: 1 Electronic Signature(s) Signed: 12/22/2019 5:22:40 PM By: Linton Ham MD Entered By: Linton Ham on 12/19/2019 13:12:44

## 2019-12-22 NOTE — Progress Notes (Signed)
CIRO, TASHIRO ([id number]) Visit Report for 12/19/2019 Arrival Information Details Patient Name: Date of Service: [place], [place] 12/19/2019 10:30 A M Medical Record Number: [id number] Patient Account Number: [id number] Date of Birth/Sex: Treating RN: 08-25-1923 (86 y.o. [name] Primary Care Kalika Smay: Tivis Ringer Other Clinician: Referring Aviraj Kentner: Treating Amiley Shishido/Extender: Anne Fu Weeks in Treatment: 1 Visit Information History Since Last Visit Added or deleted any medications: No Patient Arrived: Cane Any new allergies or adverse reactions: No Arrival Time: 11:45 Had a fall or experienced change in No Accompanied By: self activities of daily living that may affect Transfer Assistance: None risk of falls: Patient Identification Verified: Yes Signs or symptoms of abuse/neglect since last visito No Secondary Verification Process Completed: Yes Hospitalized since last visit: No Patient Requires Transmission-Based Precautions: No Implantable device outside of the clinic excluding No Patient Has Alerts: Yes cellular tissue based products placed in the center Patient Alerts: Patient on Blood Thinner since last visit: Has Dressing in Place as Prescribed: Yes Pain Present Now: Yes Electronic Signature(s) Signed: 12/22/2019 1:20:36 PM By: Sandre Kitty Entered By: Sandre Kitty on 12/19/2019 11:46:17 -------------------------------------------------------------------------------- Clinic Level of Care Assessment Details Patient Name: Date of Service: SA Joellen [place] 12/19/2019 10:30 A M Medical Record Number: [id number] Patient Account Number: [id number] Date of Birth/Sex: Treating RN: [birth year] (86 y.o. [name] Primary Care Aerial Dilley: Tivis Ringer Other Clinician: Referring Yoanna Jurczyk: Treating Jerrol Helmers/Extender: Anne Fu Weeks in Treatment: 1 Clinic Level of Care Assessment  Items TOOL 4 Quantity Score X- 1 0 Use when only an EandM is performed on FOLLOW-UP visit ASSESSMENTS - Nursing Assessment / Reassessment X- 1 10 Reassessment of Co-morbidities (includes updates in patient status) X- 1 5 Reassessment of Adherence to Treatment Plan ASSESSMENTS - Wound and Skin A ssessment / Reassessment []  - 0 Simple Wound Assessment / Reassessment - one wound X- 2 5 Complex Wound Assessment / Reassessment - multiple wounds []  - 0 Dermatologic / Skin Assessment (not related to wound area) ASSESSMENTS - Focused Assessment X- 1 5 Circumferential Edema Measurements - multi extremities []  - 0 Nutritional Assessment / Counseling / Intervention []  - 0 Lower Extremity Assessment (monofilament, tuning fork, pulses) []  - 0 Peripheral Arterial Disease Assessment (using hand held doppler) ASSESSMENTS - Ostomy and/or Continence Assessment and Care []  - 0 Incontinence Assessment and Management []  - 0 Ostomy Care Assessment and Management (repouching, etc.) PROCESS - Coordination of Care X - Simple Patient / Family Education for ongoing care 1 15 []  - 0 Complex (extensive) Patient / Family Education for ongoing care X- 1 10 Staff obtains Programmer, systems, Records, T Results / Process Orders est X- 1 10 Staff telephones HHA, Nursing Homes / Clarify orders / etc []  - 0 Routine Transfer to another Facility (non-emergent condition) []  - 0 Routine Hospital Admission (non-emergent condition) []  - 0 New Admissions / Biomedical engineer / Ordering NPWT Apligraf, etc. , []  - 0 Emergency Hospital Admission (emergent condition) X- 1 10 Simple Discharge Coordination []  - 0 Complex (extensive) Discharge Coordination PROCESS - Special Needs []  - 0 Pediatric / Minor Patient Management []  - 0 Isolation Patient Management []  - 0 Hearing / Language / Visual special needs []  - 0 Assessment of Community assistance (transportation, D/C planning, etc.) []  - 0 Additional  assistance / Altered mentation []  - 0 Support Surface(s) Assessment (bed, cushion, seat, etc.) INTERVENTIONS - Wound Cleansing / Measurement []  - 0 Simple Wound Cleansing - one  wound X- 2 5 Complex Wound Cleansing - multiple wounds X- 1 5 Wound Imaging (photographs - any number of wounds) []  - 0 Wound Tracing (instead of photographs) []  - 0 Simple Wound Measurement - one wound X- 2 5 Complex Wound Measurement - multiple wounds INTERVENTIONS - Wound Dressings X - Small Wound Dressing one or multiple wounds 1 10 []  - 0 Medium Wound Dressing one or multiple wounds []  - 0 Large Wound Dressing one or multiple wounds []  - 0 Application of Medications - topical []  - 0 Application of Medications - injection INTERVENTIONS - Miscellaneous []  - 0 External ear exam []  - 0 Specimen Collection (cultures, biopsies, blood, body fluids, etc.) []  - 0 Specimen(s) / Culture(s) sent or taken to Lab for analysis []  - 0 Patient Transfer (multiple staff / Civil Service fast streamer / Similar devices) []  - 0 Simple Staple / Suture removal (25 or less) []  - 0 Complex Staple / Suture removal (26 or more) []  - 0 Hypo / Hyperglycemic Management (close monitor of Blood Glucose) []  - 0 Ankle / Brachial Index (ABI) - do not check if billed separately X- 1 5 Vital Signs Has the patient been seen at the hospital within the last three years: Yes Total Score: 115 Level Of Care: New/Established - Level 3 Electronic Signature(s) Signed: 12/19/2019 4:23:17 PM By: Kela Millin Entered By: Kela Millin on 12/19/2019 12:49:41 -------------------------------------------------------------------------------- Encounter Discharge Information Details Patient Name: Date of Service: [name], [name] 12/19/2019 10:30 A M Medical Record Number: [id number] Patient Account Number: [id number] Date of Birth/Sex: Treating RN: [birth year] (86 y.o. Ernestene Mention Primary Care Caddie Randle: Tivis Ringer Other  Clinician: Referring Merrilyn Legler: Treating Allis Quirarte/Extender: April Holding in Treatment: 1 Encounter Discharge Information Items Discharge Condition: Stable Ambulatory Status: Cane Discharge Destination: Home Transportation: Private Auto Accompanied By: self Schedule Follow-up Appointment: Yes Clinical Summary of Care: Patient Declined Electronic Signature(s) Signed: 12/19/2019 4:22:54 PM By: Baruch Gouty RN, BSN Entered By: Baruch Gouty on 12/19/2019 15:04:42 -------------------------------------------------------------------------------- Lower Extremity Assessment Details Patient Name: Date of Service: [name], [name] 12/19/2019 10:30 A M Medical Record Number: [id number] Patient Account Number: [id number] Date of Birth/Sex: Treating RN: [birth year] (86 y.o. Ernestene Mention Primary Care Hermila Millis: Tivis Ringer Other Clinician: Referring Ronn Smolinsky: Treating Tahmid Stonehocker/Extender: Langley Gauss, Pilar Plate Weeks in Treatment: 1 Edema Assessment Assessed: [Left: No] [Right: No] Edema: [Left: Ye] [Right: s] Calf Left: Right: Point of Measurement: 38 cm From Medial Instep 32.7 cm cm Ankle Left: Right: Point of Measurement: 10 cm From Medial Instep 21.8 cm cm Vascular Assessment Pulses: Dorsalis Pedis Palpable: [Left:Yes] Electronic Signature(s) Signed: 12/19/2019 4:22:54 PM By: Baruch Gouty RN, BSN Entered By: Baruch Gouty on 12/19/2019 11:59:59 -------------------------------------------------------------------------------- Multi Wound Chart Details Patient Name: Date of Service: [name], [name] 12/19/2019 10:30 A M Medical Record Number: [id number] Patient Account Number: [id number] Date of Birth/Sex: Treating RN: 08/25/1923 (86 y.o. [name] Primary Care Rushawn Capshaw: Tivis Ringer Other Clinician: Referring Nerida Boivin: Treating Shawntia Mangal/Extender: Langley Gauss, Pilar Plate Weeks in Treatment:  1 Vital Signs Height(in): 70 Pulse(bpm): 44 Weight(lbs): 170 Blood Pressure(mmHg): 129/78 Body Mass Index(BMI): 24 Temperature(F): 98.5 Respiratory Rate(breaths/min): 18 Photos: [3:No Photos Left, Lateral Lower Leg] [4:No Photos Left, Dorsal Foot] [N/A:N/A N/A] Wound Location: [3:Blister] [4:Trauma] [N/A:N/A] Wounding Event: [3:Venous Leg Ulcer] [4:Lesion] [N/A:N/A] Primary Etiology: [3:Cataracts, Hypertension] [4:Cataracts, Hypertension] [N/A:N/A] Comorbid History: [3:10/22/2019] [4:11/26/2019] [N/A:N/A] Date Acquired: [3:1] [4:1] [N/A:N/A] Weeks of Treatment: [3:Open] [4:Open] [N/A:N/A] Wound Status: [  3:0.3x0.3x0.1] [4:2x0.8x0.1] [N/A:N/A] Measurements L x W x D (cm) [3:0.071] [4:1.257] [N/A:N/A] A (cm) : rea [3:0.007] [4:0.126] [N/A:N/A] Volume (cm) : [3:0.00%] [4:-6.70%] [N/A:N/A] % Reduction in A rea: [3:0.00%] [4:-6.80%] [N/A:N/A] % Reduction in Volume: [3:Full Thickness Without Exposed] [4:Full Thickness Without Exposed] [N/A:N/A] Classification: [3:Support Structures Small] [4:Support Structures Small] [N/A:N/A] Exudate Amount: [3:Serous] [4:Serosanguineous] [N/A:N/A] Exudate Type: [3:amber] [4:red, brown] [N/A:N/A] Exudate Color: [3:Distinct, outline attached] [4:Flat and Intact] [N/A:N/A] Wound Margin: [3:Large (67-100%)] [4:Large (67-100%)] [N/A:N/A] Granulation Amount: [3:Pink] [4:Red] [N/A:N/A] Granulation Quality: [3:None Present (0%)] [4:None Present (0%)] [N/A:N/A] Necrotic Amount: [3:Fat Layer (Subcutaneous Tissue)] [4:Fat Layer (Subcutaneous Tissue)] [N/A:N/A] Exposed Structures: [3:Exposed: Yes Fascia: No Tendon: No Muscle: No Joint: No Bone: No Large (67-100%)] [4:Exposed: Yes Fascia: No Tendon: No Muscle: No Joint: No Bone: No Small (1-33%)] [N/A:N/A] Treatment Notes Electronic Signature(s) Signed: 12/19/2019 4:23:17 PM By: Kela Millin Signed: 12/22/2019 5:22:40 PM By: Linton Ham MD Entered By: Linton Ham on 12/19/2019  13:06:22 -------------------------------------------------------------------------------- Multi-Disciplinary Care Plan Details Patient Name: Date of Service: [name], [name] 12/19/2019 10:30 A M Medical Record Number: [id number] Patient Account Number: [id number] Date of Birth/Sex: Treating RN: [birth year] (86 y.o. [name] Primary Care Haileigh Pitz: Tivis Ringer Other Clinician: Referring Nicco Reaume: Treating Gavriela Cashin/Extender: Anne Fu Weeks in Treatment: 1 Active Inactive Pain, Acute or Chronic Nursing Diagnoses: Pain, acute or chronic: actual or potential Goals: Patient/caregiver will verbalize adequate pain control between visits Date Initiated: 12/12/2019 Target Resolution Date: 01/09/2020 Goal Status: Active Interventions: Provide education on pain management Notes: Venous Leg Ulcer Nursing Diagnoses: Actual venous Insuffiency (use after diagnosis is confirmed) Goals: Patient/caregiver will verbalize understanding of disease process and disease management Date Initiated: 12/12/2019 Target Resolution Date: 01/09/2020 Goal Status: Active Interventions: Compression as ordered Provide education on venous insufficiency Notes: Wound/Skin Impairment Nursing Diagnoses: Impaired tissue integrity Goals: Ulcer/skin breakdown will have a volume reduction of 30% by week 4 Date Initiated: 12/12/2019 Target Resolution Date: 01/09/2020 Goal Status: Active Interventions: Provide education on ulcer and skin care Notes: Electronic Signature(s) Signed: 12/19/2019 4:23:17 PM By: Kela Millin Entered By: Kela Millin on 12/19/2019 12:22:19 -------------------------------------------------------------------------------- Pain Assessment Details Patient Name: Date of Service: [name], [name] 12/19/2019 10:30 A M Medical Record Number: [id number] Patient Account Number: [id number] Date of Birth/Sex: Treating RN: July 19, 1923 (86 y.o. [name] Primary Care Mikaelyn Arthurs: Tivis Ringer Other Clinician: Referring Lawerence Dery: Treating Chanelle Hodsdon/Extender: Anne Fu Weeks in Treatment: 1 Active Problems Location of Pain Severity and Description of Pain Patient Has Paino Yes Site Locations Rate the pain. Current Pain Level: 3 Pain Management and Medication Current Pain Management: Electronic Signature(s) Signed: 12/19/2019 4:23:17 PM By: Kela Millin Signed: 12/22/2019 1:20:36 PM By: Sandre Kitty Entered By: Sandre Kitty on 12/19/2019 11:46:48 -------------------------------------------------------------------------------- Patient/Caregiver Education Details Patient Name: Date of Service: [name], Jethro Bolus 6/4/2021andnbsp10:30 A M Medical Record Number: [id number] Patient Account Number: [id number] Date of Birth/Gender: Treating RN: 1923/09/23 (86 y.o. [name] Primary Care Physician: Tivis Ringer Other Clinician: Referring Physician: Treating Physician/Extender: April Holding in Treatment: 1 Education Assessment Education Provided To: Patient Education Topics Provided Pain: Handouts: A Guide to Pain Control Methods: Explain/Verbal Responses: State content correctly Venous: Handouts: Managing Venous Disease and Related Ulcers Methods: Explain/Verbal Responses: State content correctly Wound/Skin Impairment: Handouts: Caring for Your Ulcer Methods: Explain/Verbal Responses: State content correctly Electronic Signature(s) Signed: 12/19/2019 4:23:17 PM By: Kela Millin Entered By: Kela Millin on 12/19/2019 12:22:52 -------------------------------------------------------------------------------- Wound  Assessment Details Patient Name: Date of Service: [name] 12/19/2019 10:30 A M Medical Record Number: [id number] Patient Account Number: [id number] Date of Birth/Sex: Treating RN: 28-Jun-1924 (86 y.o.  [name] Primary Care Anvita Hirata: Tivis Ringer Other Clinician: Referring Astrid Vides: Treating Mert Dietrick/Extender: Langley Gauss, Pilar Plate Weeks in Treatment: 1 Wound Status Wound Number: 3 Primary Etiology: Venous Leg Ulcer Wound Location: Left, Lateral Lower Leg Wound Status: Open Wounding Event: Blister Comorbid History: Cataracts, Hypertension Date Acquired: 10/22/2019 Weeks Of Treatment: 1 Clustered Wound: No Photos Photo Uploaded By: Mikeal Hawthorne on 12/22/2019 09:38:41 Wound Measurements Length: (cm) 0.3 Width: (cm) 0.3 Depth: (cm) 0.1 Area: (cm) 0.071 Volume: (cm) 0.007 % Reduction in Area: 0% % Reduction in Volume: 0% Epithelialization: Large (67-100%) Tunneling: No Undermining: No Wound Description Classification: Full Thickness Without Exposed Support Structures Wound Margin: Distinct, outline attached Exudate Amount: Small Exudate Type: Serous Exudate Color: amber Foul Odor After Cleansing: No Slough/Fibrino No Wound Bed Granulation Amount: Large (67-100%) Exposed Structure Granulation Quality: Pink Fascia Exposed: No Necrotic Amount: None Present (0%) Fat Layer (Subcutaneous Tissue) Exposed: Yes Tendon Exposed: No Muscle Exposed: No Joint Exposed: No Bone Exposed: No Treatment Notes Wound #3 (Left, Lateral Lower Leg) 2. Periwound Care Moisturizing lotion 3. Primary Dressing Applied Collegen AG 4. Secondary Dressing Dry Gauze Foam 6. Support Layer Holiday representative) Signed: 12/19/2019 4:23:17 PM By: Kela Millin Entered By: Kela Millin on 12/19/2019 12:26:47 -------------------------------------------------------------------------------- Wound Assessment Details Patient Name: Date of Service: [name], [name] 12/19/2019 10:30 A M Medical Record Number: [id number] Patient Account Number: [id number] Date of Birth/Sex: Treating RN: [birth year] (86 y.o. [name] Primary  Care Naavya Postma: Tivis Ringer Other Clinician: Referring Kahlen Morais: Treating Cloyd Ragas/Extender: Langley Gauss, Pilar Plate Weeks in Treatment: 1 Wound Status Wound Number: 4 Primary Etiology: Lesion Wound Location: Left, Dorsal Foot Wound Status: Open Wounding Event: Trauma Comorbid History: Cataracts, Hypertension Date Acquired: 11/26/2019 Weeks Of Treatment: 1 Clustered Wound: No Photos Photo Uploaded By: Mikeal Hawthorne on 12/22/2019 09:38:42 Wound Measurements Length: (cm) 2 Width: (cm) 0.8 Depth: (cm) 0.1 Area: (cm) 1.257 Volume: (cm) 0.126 % Reduction in Area: -6.7% % Reduction in Volume: -6.8% Epithelialization: Small (1-33%) Tunneling: No Undermining: No Wound Description Classification: Full Thickness Without Exposed Support Structu Wound Margin: Flat and Intact Exudate Amount: Small Exudate Type: Serosanguineous Exudate Color: red, brown res Foul Odor After Cleansing: No Slough/Fibrino Yes Wound Bed Granulation Amount: Large (67-100%) Exposed Structure Granulation Quality: Red Fascia Exposed: No Necrotic Amount: None Present (0%) Fat Layer (Subcutaneous Tissue) Exposed: Yes Tendon Exposed: No Muscle Exposed: No Joint Exposed: No Bone Exposed: No Treatment Notes Wound #4 (Left, Dorsal Foot) 2. Periwound Care Moisturizing lotion 3. Primary Dressing Applied Collegen AG 4. Secondary Dressing Dry Gauze Foam 6. Support Layer Holiday representative) Signed: 12/19/2019 4:22:54 PM By: Baruch Gouty RN, BSN Signed: 12/19/2019 4:23:17 PM By: Kela Millin Entered By: Baruch Gouty on 12/19/2019 12:00:53 -------------------------------------------------------------------------------- Vitals Details Patient Name: Date of Service: [name], [name] 12/19/2019 10:30 A M Medical Record Number: [id number] Patient Account Number: [id number] Date of Birth/Sex: Treating RN: 04-Jan-1924 (86 y.o. [name] Primary  Care Wavie Hashimi: Tivis Ringer Other Clinician: Referring Novak Stgermaine: Treating Torryn Fiske/Extender: Anne Fu Weeks in Treatment: 1 Vital Signs Time Taken: 11:46 Temperature (F): 98.5 Height (in): 70 Pulse (bpm): 76 Weight (lbs): 170 Respiratory Rate (breaths/min): 18 Body Mass Index (BMI): 24.4 Blood Pressure (mmHg): 129/78 Reference Range: 80 - 120  mg / dl Electronic Signature(s) Signed: 12/22/2019 1:20:36 PM By: Sandre Kitty Entered By: Sandre Kitty on 12/19/2019 11:46:34

## 2020-01-01 ENCOUNTER — Encounter (HOSPITAL_COMMUNITY): Payer: Medicare Other

## 2020-01-01 ENCOUNTER — Encounter: Payer: Medicare Other | Admitting: Vascular Surgery

## 2020-01-02 ENCOUNTER — Encounter (HOSPITAL_BASED_OUTPATIENT_CLINIC_OR_DEPARTMENT_OTHER): Payer: Medicare Other | Admitting: Internal Medicine

## 2020-01-08 DIAGNOSIS — D45 Polycythemia vera: Secondary | ICD-10-CM | POA: Diagnosis not present

## 2020-01-08 DIAGNOSIS — S80812D Abrasion, left lower leg, subsequent encounter: Secondary | ICD-10-CM | POA: Diagnosis not present

## 2020-01-08 DIAGNOSIS — Z7901 Long term (current) use of anticoagulants: Secondary | ICD-10-CM | POA: Diagnosis not present

## 2020-01-08 DIAGNOSIS — I87332 Chronic venous hypertension (idiopathic) with ulcer and inflammation of left lower extremity: Secondary | ICD-10-CM | POA: Diagnosis not present

## 2020-01-08 DIAGNOSIS — L97822 Non-pressure chronic ulcer of other part of left lower leg with fat layer exposed: Secondary | ICD-10-CM | POA: Diagnosis not present

## 2020-01-08 DIAGNOSIS — I350 Nonrheumatic aortic (valve) stenosis: Secondary | ICD-10-CM | POA: Diagnosis not present

## 2020-01-08 DIAGNOSIS — I739 Peripheral vascular disease, unspecified: Secondary | ICD-10-CM | POA: Diagnosis not present

## 2020-01-09 ENCOUNTER — Ambulatory Visit (HOSPITAL_BASED_OUTPATIENT_CLINIC_OR_DEPARTMENT_OTHER): Payer: Medicare Other | Admitting: Internal Medicine

## 2020-01-09 DIAGNOSIS — L97822 Non-pressure chronic ulcer of other part of left lower leg with fat layer exposed: Secondary | ICD-10-CM | POA: Diagnosis not present

## 2020-01-09 DIAGNOSIS — I350 Nonrheumatic aortic (valve) stenosis: Secondary | ICD-10-CM | POA: Diagnosis not present

## 2020-01-09 DIAGNOSIS — D45 Polycythemia vera: Secondary | ICD-10-CM | POA: Diagnosis not present

## 2020-01-09 DIAGNOSIS — I87332 Chronic venous hypertension (idiopathic) with ulcer and inflammation of left lower extremity: Secondary | ICD-10-CM | POA: Diagnosis not present

## 2020-01-09 DIAGNOSIS — I739 Peripheral vascular disease, unspecified: Secondary | ICD-10-CM | POA: Diagnosis not present

## 2020-01-09 DIAGNOSIS — S80812D Abrasion, left lower leg, subsequent encounter: Secondary | ICD-10-CM | POA: Diagnosis not present

## 2020-01-11 DIAGNOSIS — L97822 Non-pressure chronic ulcer of other part of left lower leg with fat layer exposed: Secondary | ICD-10-CM | POA: Diagnosis not present

## 2020-01-11 DIAGNOSIS — I350 Nonrheumatic aortic (valve) stenosis: Secondary | ICD-10-CM | POA: Diagnosis not present

## 2020-01-11 DIAGNOSIS — I739 Peripheral vascular disease, unspecified: Secondary | ICD-10-CM | POA: Diagnosis not present

## 2020-01-11 DIAGNOSIS — D45 Polycythemia vera: Secondary | ICD-10-CM | POA: Diagnosis not present

## 2020-01-11 DIAGNOSIS — I87332 Chronic venous hypertension (idiopathic) with ulcer and inflammation of left lower extremity: Secondary | ICD-10-CM | POA: Diagnosis not present

## 2020-01-11 DIAGNOSIS — S80812D Abrasion, left lower leg, subsequent encounter: Secondary | ICD-10-CM | POA: Diagnosis not present

## 2020-01-15 DIAGNOSIS — I350 Nonrheumatic aortic (valve) stenosis: Secondary | ICD-10-CM | POA: Diagnosis not present

## 2020-01-15 DIAGNOSIS — I87332 Chronic venous hypertension (idiopathic) with ulcer and inflammation of left lower extremity: Secondary | ICD-10-CM | POA: Diagnosis not present

## 2020-01-15 DIAGNOSIS — I739 Peripheral vascular disease, unspecified: Secondary | ICD-10-CM | POA: Diagnosis not present

## 2020-01-15 DIAGNOSIS — L97822 Non-pressure chronic ulcer of other part of left lower leg with fat layer exposed: Secondary | ICD-10-CM | POA: Diagnosis not present

## 2020-01-15 DIAGNOSIS — S80812D Abrasion, left lower leg, subsequent encounter: Secondary | ICD-10-CM | POA: Diagnosis not present

## 2020-01-15 DIAGNOSIS — D45 Polycythemia vera: Secondary | ICD-10-CM | POA: Diagnosis not present

## 2020-01-20 DIAGNOSIS — L97822 Non-pressure chronic ulcer of other part of left lower leg with fat layer exposed: Secondary | ICD-10-CM | POA: Diagnosis not present

## 2020-01-20 DIAGNOSIS — S80812D Abrasion, left lower leg, subsequent encounter: Secondary | ICD-10-CM | POA: Diagnosis not present

## 2020-01-20 DIAGNOSIS — I739 Peripheral vascular disease, unspecified: Secondary | ICD-10-CM | POA: Diagnosis not present

## 2020-01-20 DIAGNOSIS — I87332 Chronic venous hypertension (idiopathic) with ulcer and inflammation of left lower extremity: Secondary | ICD-10-CM | POA: Diagnosis not present

## 2020-01-20 DIAGNOSIS — I350 Nonrheumatic aortic (valve) stenosis: Secondary | ICD-10-CM | POA: Diagnosis not present

## 2020-01-20 DIAGNOSIS — D45 Polycythemia vera: Secondary | ICD-10-CM | POA: Diagnosis not present

## 2020-01-21 ENCOUNTER — Other Ambulatory Visit: Payer: Self-pay

## 2020-01-21 ENCOUNTER — Inpatient Hospital Stay: Payer: Medicare Other | Attending: Hematology

## 2020-01-21 ENCOUNTER — Inpatient Hospital Stay: Payer: Medicare Other

## 2020-01-21 VITALS — BP 109/70 | HR 69 | Temp 98.4°F | Resp 16

## 2020-01-21 DIAGNOSIS — D45 Polycythemia vera: Secondary | ICD-10-CM | POA: Insufficient documentation

## 2020-01-21 LAB — CBC WITH DIFFERENTIAL/PLATELET
Abs Immature Granulocytes: 0.26 10*3/uL — ABNORMAL HIGH (ref 0.00–0.07)
Basophils Absolute: 0.3 10*3/uL — ABNORMAL HIGH (ref 0.0–0.1)
Basophils Relative: 2 %
Eosinophils Absolute: 0.2 10*3/uL (ref 0.0–0.5)
Eosinophils Relative: 1 %
HCT: 50.3 % (ref 39.0–52.0)
Hemoglobin: 15.8 g/dL (ref 13.0–17.0)
Immature Granulocytes: 1 %
Lymphocytes Relative: 6 %
Lymphs Abs: 1.2 10*3/uL (ref 0.7–4.0)
MCH: 32.4 pg (ref 26.0–34.0)
MCHC: 31.4 g/dL (ref 30.0–36.0)
MCV: 103.3 fL — ABNORMAL HIGH (ref 80.0–100.0)
Monocytes Absolute: 1.1 10*3/uL — ABNORMAL HIGH (ref 0.1–1.0)
Monocytes Relative: 6 %
Neutro Abs: 16.2 10*3/uL — ABNORMAL HIGH (ref 1.7–7.7)
Neutrophils Relative %: 84 %
Platelets: 83 10*3/uL — ABNORMAL LOW (ref 150–400)
RBC: 4.87 MIL/uL (ref 4.22–5.81)
RDW: 15.9 % — ABNORMAL HIGH (ref 11.5–15.5)
WBC: 19.2 10*3/uL — ABNORMAL HIGH (ref 4.0–10.5)
nRBC: 0 % (ref 0.0–0.2)

## 2020-01-21 NOTE — Patient Instructions (Signed)
Therapeutic Phlebotomy Therapeutic phlebotomy is the planned removal of blood from a person's body for the purpose of treating a medical condition. The procedure is similar to donating blood. Usually, about a pint (470 mL, or 0.47 L) of blood is removed. The average adult has 9-12 pints (4.3-5.7 L) of blood in the body. Therapeutic phlebotomy may be used to treat the following medical conditions:  Hemochromatosis. This is a condition in which the blood contains too much iron.  Polycythemia vera. This is a condition in which the blood contains too many red blood cells.  Porphyria cutanea tarda. This is a disease in which an important part of hemoglobin is not made properly. It results in the buildup of abnormal amounts of porphyrins in the body.  Sickle cell disease. This is a condition in which the red blood cells form an abnormal crescent shape rather than a round shape. Tell a health care provider about:  Any allergies you have.  All medicines you are taking, including vitamins, herbs, eye drops, creams, and over-the-counter medicines.  Any problems you or family members have had with anesthetic medicines.  Any blood disorders you have.  Any surgeries you have had.  Any medical conditions you have.  Whether you are pregnant or may be pregnant. What are the risks? Generally, this is a safe procedure. However, problems may occur, including:  Nausea or light-headedness.  Low blood pressure (hypotension).  Soreness, bleeding, swelling, or bruising at the needle insertion site.  Infection. What happens before the procedure?  Follow instructions from your health care provider about eating or drinking restrictions.  Ask your health care provider about: ? Changing or stopping your regular medicines. This is especially important if you are taking diabetes medicines or blood thinners (anticoagulants). ? Taking medicines such as aspirin and ibuprofen. These medicines can thin your  blood. Do not take these medicines unless your health care provider tells you to take them. ? Taking over-the-counter medicines, vitamins, herbs, and supplements.  Wear clothing with sleeves that can be raised above the elbow.  Plan to have someone take you home from the hospital or clinic.  You may have a blood sample taken.  Your blood pressure, pulse rate, and breathing rate will be measured. What happens during the procedure?   To lower your risk of infection: ? Your health care team will wash or sanitize their hands. ? Your skin will be cleaned with an antiseptic.  You may be given a medicine to numb the area (local anesthetic).  A tourniquet will be placed on your arm.  A needle will be inserted into one of your veins.  Tubing and a collection bag will be attached to that needle.  Blood will flow through the needle and tubing into the collection bag.  The collection bag will be placed lower than your arm to allow gravity to help the flow of blood into the bag.  You may be asked to open and close your hand slowly and continually during the entire collection.  After the specified amount of blood has been removed from your body, the collection bag and tubing will be clamped.  The needle will be removed from your vein.  Pressure will be held on the site of the needle insertion to stop the bleeding.  A bandage (dressing) will be placed over the needle insertion site. The procedure may vary among health care providers and hospitals. What happens after the procedure?  Your blood pressure, pulse rate, and breathing rate will be   measured after the procedure.  You will be encouraged to drink fluids.  Your recovery will be assessed and monitored.  You can return to your normal activities as told by your health care provider. Summary  Therapeutic phlebotomy is the planned removal of blood from a person's body for the purpose of treating a medical condition.  Therapeutic  phlebotomy may be used to treat hemochromatosis, polycythemia vera, porphyria cutanea tarda, or sickle cell disease.  In the procedure, a needle is inserted and about a pint (470 mL, or 0.47 L) of blood is removed. The average adult has 9-12 pints (4.3-5.7 L) of blood in the body.  This is generally a safe procedure, but it can sometimes cause problems such as nausea, light-headedness, or low blood pressure (hypotension). This information is not intended to replace advice given to you by your health care provider. Make sure you discuss any questions you have with your health care provider. Document Revised: 07/19/2017 Document Reviewed: 07/19/2017 Elsevier Patient Education  2020 [employer]  

## 2020-01-23 DIAGNOSIS — I87332 Chronic venous hypertension (idiopathic) with ulcer and inflammation of left lower extremity: Secondary | ICD-10-CM | POA: Diagnosis not present

## 2020-01-23 DIAGNOSIS — L97822 Non-pressure chronic ulcer of other part of left lower leg with fat layer exposed: Secondary | ICD-10-CM | POA: Diagnosis not present

## 2020-01-23 DIAGNOSIS — I350 Nonrheumatic aortic (valve) stenosis: Secondary | ICD-10-CM | POA: Diagnosis not present

## 2020-01-23 DIAGNOSIS — S80812D Abrasion, left lower leg, subsequent encounter: Secondary | ICD-10-CM | POA: Diagnosis not present

## 2020-01-23 DIAGNOSIS — I739 Peripheral vascular disease, unspecified: Secondary | ICD-10-CM | POA: Diagnosis not present

## 2020-01-23 DIAGNOSIS — D45 Polycythemia vera: Secondary | ICD-10-CM | POA: Diagnosis not present

## 2020-01-28 DIAGNOSIS — I739 Peripheral vascular disease, unspecified: Secondary | ICD-10-CM | POA: Diagnosis not present

## 2020-01-28 DIAGNOSIS — I350 Nonrheumatic aortic (valve) stenosis: Secondary | ICD-10-CM | POA: Diagnosis not present

## 2020-01-28 DIAGNOSIS — D45 Polycythemia vera: Secondary | ICD-10-CM | POA: Diagnosis not present

## 2020-01-28 DIAGNOSIS — L97822 Non-pressure chronic ulcer of other part of left lower leg with fat layer exposed: Secondary | ICD-10-CM | POA: Diagnosis not present

## 2020-01-28 DIAGNOSIS — S80812D Abrasion, left lower leg, subsequent encounter: Secondary | ICD-10-CM | POA: Diagnosis not present

## 2020-01-28 DIAGNOSIS — I87332 Chronic venous hypertension (idiopathic) with ulcer and inflammation of left lower extremity: Secondary | ICD-10-CM | POA: Diagnosis not present

## 2020-01-30 DIAGNOSIS — Z1331 Encounter for screening for depression: Secondary | ICD-10-CM | POA: Diagnosis not present

## 2020-01-30 DIAGNOSIS — F329 Major depressive disorder, single episode, unspecified: Secondary | ICD-10-CM | POA: Diagnosis not present

## 2020-01-30 DIAGNOSIS — K4090 Unilateral inguinal hernia, without obstruction or gangrene, not specified as recurrent: Secondary | ICD-10-CM | POA: Diagnosis not present

## 2020-01-30 DIAGNOSIS — Z0000 Encounter for general adult medical examination without abnormal findings: Secondary | ICD-10-CM | POA: Diagnosis not present

## 2020-01-30 DIAGNOSIS — D45 Polycythemia vera: Secondary | ICD-10-CM | POA: Diagnosis not present

## 2020-01-30 DIAGNOSIS — I10 Essential (primary) hypertension: Secondary | ICD-10-CM | POA: Diagnosis not present

## 2020-01-30 DIAGNOSIS — E785 Hyperlipidemia, unspecified: Secondary | ICD-10-CM | POA: Diagnosis not present

## 2020-01-30 DIAGNOSIS — M109 Gout, unspecified: Secondary | ICD-10-CM | POA: Diagnosis not present

## 2020-01-30 DIAGNOSIS — I350 Nonrheumatic aortic (valve) stenosis: Secondary | ICD-10-CM | POA: Diagnosis not present

## 2020-01-30 DIAGNOSIS — I720 Aneurysm of carotid artery: Secondary | ICD-10-CM | POA: Diagnosis not present

## 2020-01-30 DIAGNOSIS — I87332 Chronic venous hypertension (idiopathic) with ulcer and inflammation of left lower extremity: Secondary | ICD-10-CM | POA: Diagnosis not present

## 2020-01-30 DIAGNOSIS — R7302 Impaired glucose tolerance (oral): Secondary | ICD-10-CM | POA: Diagnosis not present

## 2020-01-30 DIAGNOSIS — M1990 Unspecified osteoarthritis, unspecified site: Secondary | ICD-10-CM | POA: Diagnosis not present

## 2020-01-30 DIAGNOSIS — R82998 Other abnormal findings in urine: Secondary | ICD-10-CM | POA: Diagnosis not present

## 2020-02-26 DIAGNOSIS — K4090 Unilateral inguinal hernia, without obstruction or gangrene, not specified as recurrent: Secondary | ICD-10-CM | POA: Diagnosis not present

## 2020-03-23 NOTE — Progress Notes (Signed)
[place]   Telephone:(336) [phone] Fax:([phone]   Clinic Follow up Note   Patient Care Team: [name], MD as PCP - General (Internal Medicine)  Date of Service:  03/24/2020  CHIEF COMPLAINT: Follow up for Polycythemia Vera  CURRENT THERAPY:  1. Phlebotomy as needed if HCT>47%.  2. Hydrea decreased to 500mg  daily 4-5 days/week since 02/2015; increased to take hydrea 6 days/week starting 02/08/17 (hold on Mondays). Increase to Hydrea 500mg  BID on Mondays and 500mg  once daily for the rest of week starting 12/13/17. Due tothrombocytopeniawe decreased him to 500mg  5 days/week on 01/28/18, hold Mondays and thursdays. Returned to [facility] 500mg  dailyin Jan 2020. Increased to500mg  bid on Mondays and continue 500mg  daily for the rest of weekstarting 07/29/19.Reduce to 500mg  daily in early 08/2019 due to recent ulcer. 3. Xarelto 20 mg started about mid-February 2015 and off after 6 month, restarted again in 02/2015 due to left LE DVT  INTERVAL HISTORY:  JOSEMARIA BRINING is here for a follow up of PV. He presents to the clinic alone. He notes he has skin ulcers of his legs for the past 2-3 months. It took 6 months to heal. He continues to take Hydrea 500mg  once daily and tolerating well.     REVIEW OF SYSTEMS:   Constitutional: Denies fevers, chills or abnormal weight loss Eyes: Denies blurriness of vision Ears, nose, mouth, throat, and face: Denies mucositis or sore throat Respiratory: Denies cough, dyspnea or wheezes Cardiovascular: Denies palpitation, chest discomfort or lower extremity swelling Gastrointestinal:  Denies nausea, heartburn or change in bowel habits Skin: Denies abnormal skin rashes Lymphatics: Denies new lymphadenopathy or easy bruising Neurological:Denies numbness, tingling or new weaknesses Behavioral/Psych: Mood is stable, no new changes  All other systems were reviewed with the patient and are negative.  MEDICAL HISTORY:  Past Medical  History:  Diagnosis Date  . Bone marrow disease   . BPH (benign prostatic hypertrophy)   . Depression   . DJD (degenerative joint disease)   . Dyslipidemia   . Gout   . HTN (hypertension)   . Mild aortic stenosis   . Polycythemia vera(238.4)     SURGICAL HISTORY: Past Surgical History:  Procedure Laterality Date  . ENDOVENOUS ABLATION SAPHENOUS VEIN W/ LASER Left 09/04/2016   endovenous laser ablation left greater saphenous vein by Tinnie Gens MD  . HEMORROIDECTOMY      I have reviewed the social history and family history with the patient and they are unchanged from previous note.  ALLERGIES:  is allergic to amoxicillin and colchicine.  MEDICATIONS:  Current Outpatient Medications  Medication Sig Dispense Refill  . cholecalciferol (VITAMIN D3) 25 MCG (1000 UNIT) tablet Take 1,000 Units by mouth daily.    Marland Kitchen acetaminophen (TYLENOL) 500 MG tablet Take 650 mg by mouth every 6 (six) hours as needed.     Marland Kitchen allopurinol (ZYLOPRIM) 100 MG tablet Take 100 mg by mouth 2 (two) times daily.     Marland Kitchen escitalopram (LEXAPRO) 10 MG tablet Take 5 mg by mouth at bedtime.     Marland Kitchen glucosamine-chondroitin 500-400 MG tablet Take 1 tablet by mouth daily.    . hydroxyurea (HYDREA) 500 MG capsule 1 tab daily in the morning except 1 tab twice daily on Mondays (Patient taking differently: 500 mg daily. ) 40 capsule 0  . lisinopril (PRINIVIL,ZESTRIL) 10 MG tablet Take 10 mg by mouth daily.    . Multiple Vitamin (MULTIVITAMIN WITH MINERALS) TABS tablet Take 1 tablet by mouth daily.    Marland Kitchen  omega-3 acid ethyl esters (LOVAZA) 1 g capsule Take 1 g by mouth daily.    . rivaroxaban (XARELTO) 20 MG TABS tablet Take 20 mg by mouth daily with supper.    . terazosin (HYTRIN) 5 MG capsule Take 5 mg by mouth at bedtime.    . vitamin E (VITAMIN E) 400 UNIT capsule Take 400 Units by mouth daily.     No current facility-administered medications for this visit.    PHYSICAL EXAMINATION: ECOG PERFORMANCE STATUS: 2 -  Symptomatic, <50% confined to bed  Vitals:   03/24/20 1041  BP: 123/69  Pulse: 80  Resp: 17  Temp: 98.7 F (37.1 C)  SpO2: 94%   Filed Weights   03/24/20 1041  Weight: 170 lb 14.4 oz (77.5 kg)    Due to COVID19 we will limit examination to appearance. Patient had no complaints.  GENERAL:alert, no distress and comfortable SKIN: skin color normal, no rashes or significant lesions (+) resolved leg skin ulcer  EYES: normal, Conjunctiva are pink and non-injected, sclera clear  NEURO: alert & oriented x 3 with fluent speech   LABORATORY DATA:  I have reviewed the data as listed CBC Latest Ref Rng & Units 03/24/2020 01/21/2020 11/20/2019  WBC 4.0 - 10.5 K/uL 17.0(H) 19.2(H) 19.2(H)  Hemoglobin 13.0 - 17.0 g/dL 16.1 15.8 16.2  Hematocrit 39 - 52 % 51.1 50.3 52.4(H)  Platelets 150 - 400 K/uL 81(L) 83(L) 95(L)     CMP Latest Ref Rng & Units 03/24/2020 11/20/2019 09/18/2019  Glucose 70 - 99 mg/dL 102(H) 123(H) 125(H)  BUN 8 - 23 mg/dL 24(H) 20 27(H)  Creatinine 0.61 - 1.24 mg/dL 1.25(H) 1.20 1.31(H)  Sodium 135 - 145 mmol/L 139 140 139  Potassium 3.5 - 5.1 mmol/L 4.2 4.6 4.8  Chloride 98 - 111 mmol/L 106 105 104  CO2 22 - 32 mmol/L 27 26 26   Calcium 8.9 - 10.3 mg/dL 8.7(L) 8.5(L) 8.7(L)  Total Protein 6.5 - 8.1 g/dL 6.0(L) 5.6(L) 5.9(L)  Total Bilirubin 0.3 - 1.2 mg/dL 0.8 0.7 0.7  Alkaline Phos 38 - 126 U/L 84 88 97  AST 15 - 41 U/L 23 20 21   ALT 0 - 44 U/L 16 16 19       RADIOGRAPHIC STUDIES: I have personally reviewed the radiological images as listed and agreed with the findings in the report. No results found.   ASSESSMENT & PLAN:  MANNIX KROEKER is a 86 y.o. male with    1. Polycythemia vera diagnosed in 1997, JAK2 mutation positive.  -We previously reviewed the nature history of PV, most people do very well, some people would develop myelofibrosis or leukemia late on. The major complication is thrombosis. -He has been on Hydrea 500 mg daily for 4-5 yearswith dose  adjustment as needed. He is currently on Hydrea500mg  bid on Mondays and continue 500mg  daily for the rest of week. -We will continue phlebotomy if hematocrit more than 47%(instead of 45% due to his advanced age). He has needed more frequently while on lower dose Hydrea.  -Labs reviewed, CBC and CMP WNL except WBC 17, plt 81K, ANC 14.4, BG 102, Cr 1.25, Ca 8.7, protein 6.  -Will proceed with phlebotomy today. He is reluctant to increase dose of Hydrea given increased risk of ulcers again.  -Will monitor closely with labs and phlebotomy every 6 week with more frequent Phlebotomies will check iron level. -F/u in 4-5 months    2.RLE DVT, LLE DVT in 02/2015, likely secondary to #1 -Continue Xarelto indefinitely,  due to recurrent DVT. He can use Vit C to help bruising  -He knows to be careful about fall and injury -He had vascular surgery on his left leg on 09/04/16 for poor circulation by Dr. Kellie Simmering.  3. Thrombocytopenia, secondary to hydrea  -Hydrea has been dose adjusted as needed. -Goal PLT above 70-80K. -Mild and stable currently   4. Left lower lateral ankle ulcer (resolved) -Onset in early 2021 after bumping legs. Has taken 6 months to heal.    PLAN: -Labs reviewed, Hct 51.5%. Proceed with Phlebotomy today  -Continue hydrea 500mg  daily -Lab every 6 weeks with phlebotomy for Hct >47% -F/u in 4-5 months    No problem-specific Assessment & Plan notes found for this encounter.   No orders of the defined types were placed in this encounter.  All questions were answered. The patient knows to call the clinic with any problems, questions or concerns. No barriers to learning was detected. The total time spent in the appointment was 20 minutes.     Truitt Merle, MD 03/24/2020   I, Joslyn Devon, am acting as scribe for Truitt Merle, MD.   I have reviewed the above documentation for accuracy and completeness, and I agree with the above.

## 2020-03-24 ENCOUNTER — Inpatient Hospital Stay: Payer: Medicare Other

## 2020-03-24 ENCOUNTER — Inpatient Hospital Stay: Payer: Medicare Other | Attending: Hematology

## 2020-03-24 ENCOUNTER — Inpatient Hospital Stay (HOSPITAL_BASED_OUTPATIENT_CLINIC_OR_DEPARTMENT_OTHER): Payer: Medicare Other | Admitting: Hematology

## 2020-03-24 ENCOUNTER — Other Ambulatory Visit: Payer: Self-pay

## 2020-03-24 ENCOUNTER — Encounter: Payer: Self-pay | Admitting: Hematology

## 2020-03-24 VITALS — BP 123/69 | HR 80 | Temp 98.7°F | Resp 17 | Ht 71.0 in | Wt 170.9 lb

## 2020-03-24 DIAGNOSIS — D45 Polycythemia vera: Secondary | ICD-10-CM | POA: Insufficient documentation

## 2020-03-24 LAB — CBC WITH DIFFERENTIAL/PLATELET
Abs Immature Granulocytes: 0.19 10*3/uL — ABNORMAL HIGH (ref 0.00–0.07)
Basophils Absolute: 0.4 10*3/uL — ABNORMAL HIGH (ref 0.0–0.1)
Basophils Relative: 2 %
Eosinophils Absolute: 0.2 10*3/uL (ref 0.0–0.5)
Eosinophils Relative: 1 %
HCT: 51.1 % (ref 39.0–52.0)
Hemoglobin: 16.1 g/dL (ref 13.0–17.0)
Immature Granulocytes: 1 %
Lymphocytes Relative: 7 %
Lymphs Abs: 1.1 10*3/uL (ref 0.7–4.0)
MCH: 33.8 pg (ref 26.0–34.0)
MCHC: 31.5 g/dL (ref 30.0–36.0)
MCV: 107.4 fL — ABNORMAL HIGH (ref 80.0–100.0)
Monocytes Absolute: 0.7 10*3/uL (ref 0.1–1.0)
Monocytes Relative: 4 %
Neutro Abs: 14.4 10*3/uL — ABNORMAL HIGH (ref 1.7–7.7)
Neutrophils Relative %: 85 %
Platelets: 81 10*3/uL — ABNORMAL LOW (ref 150–400)
RBC: 4.76 MIL/uL (ref 4.22–5.81)
RDW: 16.6 % — ABNORMAL HIGH (ref 11.5–15.5)
WBC: 17 10*3/uL — ABNORMAL HIGH (ref 4.0–10.5)
nRBC: 0.1 % (ref 0.0–0.2)

## 2020-03-24 LAB — COMPREHENSIVE METABOLIC PANEL
ALT: 16 U/L (ref 0–44)
AST: 23 U/L (ref 15–41)
Albumin: 3.5 g/dL (ref 3.5–5.0)
Alkaline Phosphatase: 84 U/L (ref 38–126)
Anion gap: 6 (ref 5–15)
BUN: 24 mg/dL — ABNORMAL HIGH (ref 8–23)
CO2: 27 mmol/L (ref 22–32)
Calcium: 8.7 mg/dL — ABNORMAL LOW (ref 8.9–10.3)
Chloride: 106 mmol/L (ref 98–111)
Creatinine, Ser: 1.25 mg/dL — ABNORMAL HIGH (ref 0.61–1.24)
GFR calc Af Amer: 57 mL/min — ABNORMAL LOW (ref >60)
GFR calc non Af Amer: 49 mL/min — ABNORMAL LOW (ref >60)
Glucose, Bld: 102 mg/dL — ABNORMAL HIGH (ref 70–99)
Potassium: 4.2 mmol/L (ref 3.5–5.1)
Sodium: 139 mmol/L (ref 135–145)
Total Bilirubin: 0.8 mg/dL (ref 0.3–1.2)
Total Protein: 6 g/dL — ABNORMAL LOW (ref 6.5–8.1)

## 2020-03-24 NOTE — Progress Notes (Signed)
Patient presented today for phlebotomy per MD order. A 16 gauge needle was placed in the Left AC and 500 cc of blood was removed.  Needle was removed, catheter fully intact.  Patient was observed for 30 min post procedure with food and beverage provided.  Patient was discharge in stable condition with no complaints.

## 2020-03-24 NOTE — Patient Instructions (Signed)
Therapeutic Phlebotomy Therapeutic phlebotomy is the planned removal of blood from a person's body for the purpose of treating a medical condition. The procedure is similar to donating blood. Usually, about a pint (470 mL, or 0.47 L) of blood is removed. The average adult has 9-12 pints (4.3-5.7 L) of blood in the body. Therapeutic phlebotomy may be used to treat the following medical conditions:  Hemochromatosis. This is a condition in which the blood contains too much iron.  Polycythemia vera. This is a condition in which the blood contains too many red blood cells.  Porphyria cutanea tarda. This is a disease in which an important part of hemoglobin is not made properly. It results in the buildup of abnormal amounts of porphyrins in the body.  Sickle cell disease. This is a condition in which the red blood cells form an abnormal crescent shape rather than a round shape. Tell a health care provider about:  Any allergies you have.  All medicines you are taking, including vitamins, herbs, eye drops, creams, and over-the-counter medicines.  Any problems you or family members have had with anesthetic medicines.  Any blood disorders you have.  Any surgeries you have had.  Any medical conditions you have.  Whether you are pregnant or may be pregnant. What are the risks? Generally, this is a safe procedure. However, problems may occur, including:  Nausea or light-headedness.  Low blood pressure (hypotension).  Soreness, bleeding, swelling, or bruising at the needle insertion site.  Infection. What happens before the procedure?  Follow instructions from your health care provider about eating or drinking restrictions.  Ask your health care provider about: ? Changing or stopping your regular medicines. This is especially important if you are taking diabetes medicines or blood thinners (anticoagulants). ? Taking medicines such as aspirin and ibuprofen. These medicines can thin your  blood. Do not take these medicines unless your health care provider tells you to take them. ? Taking over-the-counter medicines, vitamins, herbs, and supplements.  Wear clothing with sleeves that can be raised above the elbow.  Plan to have someone take you home from the hospital or clinic.  You may have a blood sample taken.  Your blood pressure, pulse rate, and breathing rate will be measured. What happens during the procedure?   To lower your risk of infection: ? Your health care team will wash or sanitize their hands. ? Your skin will be cleaned with an antiseptic.  You may be given a medicine to numb the area (local anesthetic).  A tourniquet will be placed on your arm.  A needle will be inserted into one of your veins.  Tubing and a collection bag will be attached to that needle.  Blood will flow through the needle and tubing into the collection bag.  The collection bag will be placed lower than your arm to allow gravity to help the flow of blood into the bag.  You may be asked to open and close your hand slowly and continually during the entire collection.  After the specified amount of blood has been removed from your body, the collection bag and tubing will be clamped.  The needle will be removed from your vein.  Pressure will be held on the site of the needle insertion to stop the bleeding.  A bandage (dressing) will be placed over the needle insertion site. The procedure may vary among health care providers and hospitals. What happens after the procedure?  Your blood pressure, pulse rate, and breathing rate will be   measured after the procedure.  You will be encouraged to drink fluids.  Your recovery will be assessed and monitored.  You can return to your normal activities as told by your health care provider. Summary  Therapeutic phlebotomy is the planned removal of blood from a person's body for the purpose of treating a medical condition.  Therapeutic  phlebotomy may be used to treat hemochromatosis, polycythemia vera, porphyria cutanea tarda, or sickle cell disease.  In the procedure, a needle is inserted and about a pint (470 mL, or 0.47 L) of blood is removed. The average adult has 9-12 pints (4.3-5.7 L) of blood in the body.  This is generally a safe procedure, but it can sometimes cause problems such as nausea, light-headedness, or low blood pressure (hypotension). This information is not intended to replace advice given to you by your health care provider. Make sure you discuss any questions you have with your health care provider. Document Revised: 07/19/2017 Document Reviewed: 07/19/2017 Elsevier Patient Education  2020 [employer]  

## 2020-03-25 ENCOUNTER — Telehealth: Payer: Self-pay | Admitting: Hematology

## 2020-03-25 NOTE — Telephone Encounter (Signed)
Scheduled appts per 9/8 los. Pt confirmed appt date and time.

## 2020-05-06 ENCOUNTER — Inpatient Hospital Stay: Payer: Medicare Other

## 2020-05-06 ENCOUNTER — Inpatient Hospital Stay: Payer: Medicare Other | Attending: Hematology

## 2020-05-06 ENCOUNTER — Other Ambulatory Visit: Payer: Self-pay

## 2020-05-06 VITALS — BP 110/68 | HR 68 | Temp 98.2°F | Resp 16

## 2020-05-06 DIAGNOSIS — D45 Polycythemia vera: Secondary | ICD-10-CM

## 2020-05-06 LAB — CBC WITH DIFFERENTIAL/PLATELET
Abs Immature Granulocytes: 0.16 10*3/uL — ABNORMAL HIGH (ref 0.00–0.07)
Basophils Absolute: 0.3 10*3/uL — ABNORMAL HIGH (ref 0.0–0.1)
Basophils Relative: 2 %
Eosinophils Absolute: 0.1 10*3/uL (ref 0.0–0.5)
Eosinophils Relative: 1 %
HCT: 51.8 % (ref 39.0–52.0)
Hemoglobin: 16 g/dL (ref 13.0–17.0)
Immature Granulocytes: 1 %
Lymphocytes Relative: 7 %
Lymphs Abs: 1.2 10*3/uL (ref 0.7–4.0)
MCH: 33.3 pg (ref 26.0–34.0)
MCHC: 30.9 g/dL (ref 30.0–36.0)
MCV: 107.7 fL — ABNORMAL HIGH (ref 80.0–100.0)
Monocytes Absolute: 0.8 10*3/uL (ref 0.1–1.0)
Monocytes Relative: 5 %
Neutro Abs: 14.1 10*3/uL — ABNORMAL HIGH (ref 1.7–7.7)
Neutrophils Relative %: 84 %
Platelets: 66 10*3/uL — ABNORMAL LOW (ref 150–400)
RBC: 4.81 MIL/uL (ref 4.22–5.81)
RDW: 15.5 % (ref 11.5–15.5)
WBC: 16.7 10*3/uL — ABNORMAL HIGH (ref 4.0–10.5)
nRBC: 0.2 % (ref 0.0–0.2)

## 2020-05-06 NOTE — Progress Notes (Signed)
Patient presented today for phlebotomy per MD order. A 16 gauge needle was placed in the Right AC. Phlebotomy started at 10:41am  and 503 cc of blood was removed. Time of completion was 10:57am.  Needle was removed, catheter fully intact.  Patient was observed for 30 min post procedure with food and beverage provided.  Patient was discharge in stable condition with no complaints.

## 2020-05-06 NOTE — Patient Instructions (Signed)
Therapeutic Phlebotomy Therapeutic phlebotomy is the planned removal of blood from a person's body for the purpose of treating a medical condition. The procedure is similar to donating blood. Usually, about a pint (470 mL, or 0.47 L) of blood is removed. The average adult has 9-12 pints (4.3-5.7 L) of blood in the body. Therapeutic phlebotomy may be used to treat the following medical conditions:  Hemochromatosis. This is a condition in which the blood contains too much iron.  Polycythemia vera. This is a condition in which the blood contains too many red blood cells.  Porphyria cutanea tarda. This is a disease in which an important part of hemoglobin is not made properly. It results in the buildup of abnormal amounts of porphyrins in the body.  Sickle cell disease. This is a condition in which the red blood cells form an abnormal crescent shape rather than a round shape. Tell a health care provider about:  Any allergies you have.  All medicines you are taking, including vitamins, herbs, eye drops, creams, and over-the-counter medicines.  Any problems you or family members have had with anesthetic medicines.  Any blood disorders you have.  Any surgeries you have had.  Any medical conditions you have.  Whether you are pregnant or may be pregnant. What are the risks? Generally, this is a safe procedure. However, problems may occur, including:  Nausea or light-headedness.  Low blood pressure (hypotension).  Soreness, bleeding, swelling, or bruising at the needle insertion site.  Infection. What happens before the procedure?  Follow instructions from your health care provider about eating or drinking restrictions.  Ask your health care provider about: ? Changing or stopping your regular medicines. This is especially important if you are taking diabetes medicines or blood thinners (anticoagulants). ? Taking medicines such as aspirin and ibuprofen. These medicines can thin your  blood. Do not take these medicines unless your health care provider tells you to take them. ? Taking over-the-counter medicines, vitamins, herbs, and supplements.  Wear clothing with sleeves that can be raised above the elbow.  Plan to have someone take you home from the hospital or clinic.  You may have a blood sample taken.  Your blood pressure, pulse rate, and breathing rate will be measured. What happens during the procedure?   To lower your risk of infection: ? Your health care team will wash or sanitize their hands. ? Your skin will be cleaned with an antiseptic.  You may be given a medicine to numb the area (local anesthetic).  A tourniquet will be placed on your arm.  A needle will be inserted into one of your veins.  Tubing and a collection bag will be attached to that needle.  Blood will flow through the needle and tubing into the collection bag.  The collection bag will be placed lower than your arm to allow gravity to help the flow of blood into the bag.  You may be asked to open and close your hand slowly and continually during the entire collection.  After the specified amount of blood has been removed from your body, the collection bag and tubing will be clamped.  The needle will be removed from your vein.  Pressure will be held on the site of the needle insertion to stop the bleeding.  A bandage (dressing) will be placed over the needle insertion site. The procedure may vary among health care providers and hospitals. What happens after the procedure?  Your blood pressure, pulse rate, and breathing rate will be   measured after the procedure.  You will be encouraged to drink fluids.  Your recovery will be assessed and monitored.  You can return to your normal activities as told by your health care provider. Summary  Therapeutic phlebotomy is the planned removal of blood from a person's body for the purpose of treating a medical condition.  Therapeutic  phlebotomy may be used to treat hemochromatosis, polycythemia vera, porphyria cutanea tarda, or sickle cell disease.  In the procedure, a needle is inserted and about a pint (470 mL, or 0.47 L) of blood is removed. The average adult has 9-12 pints (4.3-5.7 L) of blood in the body.  This is generally a safe procedure, but it can sometimes cause problems such as nausea, light-headedness, or low blood pressure (hypotension). This information is not intended to replace advice given to you by your health care provider. Make sure you discuss any questions you have with your health care provider. Document Revised: 07/19/2017 Document Reviewed: 07/19/2017 Elsevier Patient Education  2020 [employer]  

## 2020-05-15 DIAGNOSIS — Z23 Encounter for immunization: Secondary | ICD-10-CM | POA: Diagnosis not present

## 2020-06-17 ENCOUNTER — Other Ambulatory Visit: Payer: Medicare Other

## 2020-06-17 ENCOUNTER — Inpatient Hospital Stay: Payer: Medicare Other

## 2020-06-17 ENCOUNTER — Other Ambulatory Visit: Payer: Self-pay

## 2020-06-17 ENCOUNTER — Telehealth: Payer: Self-pay

## 2020-06-17 ENCOUNTER — Inpatient Hospital Stay: Payer: Medicare Other | Attending: Hematology

## 2020-06-17 VITALS — BP 110/65 | HR 91 | Temp 97.4°F | Resp 16 | Ht 71.0 in | Wt 168.7 lb

## 2020-06-17 DIAGNOSIS — D45 Polycythemia vera: Secondary | ICD-10-CM | POA: Diagnosis not present

## 2020-06-17 LAB — COMPREHENSIVE METABOLIC PANEL
ALT: 13 U/L (ref 0–44)
AST: 21 U/L (ref 15–41)
Albumin: 3.6 g/dL (ref 3.5–5.0)
Alkaline Phosphatase: 72 U/L (ref 38–126)
Anion gap: 8 (ref 5–15)
BUN: 30 mg/dL — ABNORMAL HIGH (ref 8–23)
CO2: 26 mmol/L (ref 22–32)
Calcium: 9 mg/dL (ref 8.9–10.3)
Chloride: 105 mmol/L (ref 98–111)
Creatinine, Ser: 1.25 mg/dL — ABNORMAL HIGH (ref 0.61–1.24)
GFR, Estimated: 54 mL/min — ABNORMAL LOW (ref >60)
Glucose, Bld: 114 mg/dL — ABNORMAL HIGH (ref 70–99)
Potassium: 4.4 mmol/L (ref 3.5–5.1)
Sodium: 139 mmol/L (ref 135–145)
Total Bilirubin: 1 mg/dL (ref 0.3–1.2)
Total Protein: 5.9 g/dL — ABNORMAL LOW (ref 6.5–8.1)

## 2020-06-17 LAB — CBC WITH DIFFERENTIAL/PLATELET
Abs Immature Granulocytes: 0.19 10*3/uL — ABNORMAL HIGH (ref 0.00–0.07)
Basophils Absolute: 0.3 10*3/uL — ABNORMAL HIGH (ref 0.0–0.1)
Basophils Relative: 2 %
Eosinophils Absolute: 0.1 10*3/uL (ref 0.0–0.5)
Eosinophils Relative: 1 %
HCT: 50.9 % (ref 39.0–52.0)
Hemoglobin: 16.1 g/dL (ref 13.0–17.0)
Immature Granulocytes: 1 %
Lymphocytes Relative: 8 %
Lymphs Abs: 1.2 10*3/uL (ref 0.7–4.0)
MCH: 33.1 pg (ref 26.0–34.0)
MCHC: 31.6 g/dL (ref 30.0–36.0)
MCV: 104.5 fL — ABNORMAL HIGH (ref 80.0–100.0)
Monocytes Absolute: 0.6 10*3/uL (ref 0.1–1.0)
Monocytes Relative: 4 %
Neutro Abs: 12.6 10*3/uL — ABNORMAL HIGH (ref 1.7–7.7)
Neutrophils Relative %: 84 %
Platelets: 61 10*3/uL — ABNORMAL LOW (ref 150–400)
RBC: 4.87 MIL/uL (ref 4.22–5.81)
RDW: 15.4 % (ref 11.5–15.5)
WBC: 15 10*3/uL — ABNORMAL HIGH (ref 4.0–10.5)
nRBC: 0.3 % — ABNORMAL HIGH (ref 0.0–0.2)

## 2020-06-17 NOTE — Patient Instructions (Signed)

## 2020-06-17 NOTE — Progress Notes (Signed)
[name] [name] presents today for phlebotomy per MD orders. Phlebotomy procedure started at 1105 and ended at 1110, 532 grams removed.  IV needle removed intact.  Snack and bevage provided.  Patient observed for 30 minutes after procedure without any incident.  VSS.  Patient tolerated procedure well.  Patient discharged via wheelchair with no s/s or c/o distress.

## 2020-06-17 NOTE — Telephone Encounter (Signed)
Plt count 61,000. Dr Burr Medico notified.  Pt to decrease hydrea to 500 mg daily 5 times per week.  Appts to be moved to the first week of January.  [name] [name] verbalized understanding.

## 2020-06-18 ENCOUNTER — Telehealth: Payer: Self-pay | Admitting: Hematology

## 2020-06-18 NOTE — Telephone Encounter (Signed)
Scheduled appt per 12/2 sch msg - pt is aware of appt date and time   

## 2020-07-21 NOTE — Progress Notes (Incomplete)
[place]   Telephone:(336) [phone] Fax:(336) [phone]   Clinic Follow up Note   Patient Care Team: [name], MD as PCP - General (Internal Medicine)  Date of Service:  07/21/2020  CHIEF COMPLAINT: Follow up for Polycythemia Vera  CURRENT THERAPY:  1. Phlebotomy as needed if HCT>47%.  2. Hydrea decreased to 500mg  daily 4-5 days/week since 02/2015; increased to take hydrea 6 days/week starting 02/08/17 (hold on Mondays). Increase to Hydrea 500mg  BID on Mondays and 500mg  once daily for the rest of week starting 12/13/17. Due tothrombocytopeniawe decreased him to 500mg  5 days/week on 01/28/18, hold Mondays and thursdays. Returned to [facility] 500mg  dailyin Jan 2020. Increased to500mg  bid on Mondays and continue 500mg  daily for the rest of weekstarting 07/29/19.Reduce to 500mg  daily in early 08/2019 due to recent ulcer.Dose reduced hydrea to 500 mg daily 5 times per week on 06/17/20*** 3. Xarelto 20 mg started about mid-February 2015 and off after 6 month, restarted again in 02/2015 due to left LE DVT  INTERVAL HISTORY: *** [name] [name] is here for a follow up of PV. He presents to the clinic alone.    REVIEW OF SYSTEMS:  *** Constitutional: Denies fevers, chills or abnormal weight loss Eyes: Denies blurriness of vision Ears, nose, mouth, throat, and face: Denies mucositis or sore throat Respiratory: Denies cough, dyspnea or wheezes Cardiovascular: Denies palpitation, chest discomfort or lower extremity swelling Gastrointestinal:  Denies nausea, heartburn or change in bowel habits Skin: Denies abnormal skin rashes Lymphatics: Denies new lymphadenopathy or easy bruising Neurological:Denies numbness, tingling or new weaknesses Behavioral/Psych: Mood is stable, no new changes  All other systems were reviewed with the patient and are negative.  MEDICAL HISTORY:  Past Medical History:  Diagnosis Date  . Bone marrow disease   . BPH (benign prostatic hypertrophy)    . Depression   . DJD (degenerative joint disease)   . Dyslipidemia   . Gout   . HTN (hypertension)   . Mild aortic stenosis   . Polycythemia vera(238.4)     SURGICAL HISTORY: Past Surgical History:  Procedure Laterality Date  . ENDOVENOUS ABLATION SAPHENOUS VEIN W/ LASER Left 09/04/2016   endovenous laser ablation left greater saphenous vein by Tinnie Gens MD  . HEMORROIDECTOMY      I have reviewed the social history and family history with the patient and they are unchanged from previous note.  ALLERGIES:  is allergic to amoxicillin and colchicine.  MEDICATIONS:  Current Outpatient Medications  Medication Sig Dispense Refill  . acetaminophen (TYLENOL) 500 MG tablet Take 650 mg by mouth every 6 (six) hours as needed.     Marland Kitchen allopurinol (ZYLOPRIM) 100 MG tablet Take 100 mg by mouth 2 (two) times daily.     . cholecalciferol (VITAMIN D3) 25 MCG (1000 UNIT) tablet Take 1,000 Units by mouth daily.    Marland Kitchen escitalopram (LEXAPRO) 10 MG tablet Take 5 mg by mouth at bedtime.     Marland Kitchen glucosamine-chondroitin 500-400 MG tablet Take 1 tablet by mouth daily.    . hydroxyurea (HYDREA) 500 MG capsule 1 tab daily in the morning except 1 tab twice daily on Mondays (Patient taking differently: 500 mg daily. ) 40 capsule 0  . lisinopril (PRINIVIL,ZESTRIL) 10 MG tablet Take 10 mg by mouth daily.    . Multiple Vitamin (MULTIVITAMIN WITH MINERALS) TABS tablet Take 1 tablet by mouth daily.    Marland Kitchen omega-3 acid ethyl esters (LOVAZA) 1 g capsule Take 1 g by mouth daily.    Marland Kitchen  rivaroxaban (XARELTO) 20 MG TABS tablet Take 20 mg by mouth daily with supper.    . terazosin (HYTRIN) 5 MG capsule Take 5 mg by mouth at bedtime.    . vitamin E (VITAMIN E) 400 UNIT capsule Take 400 Units by mouth daily.     No current facility-administered medications for this visit.    PHYSICAL EXAMINATION: ECOG PERFORMANCE STATUS: {CHL ONC ECOG PS:[id number]}  There were no vitals filed for this visit. There were no vitals filed  for this visit. *** GENERAL:alert, no distress and comfortable SKIN: skin color, texture, turgor are normal, no rashes or significant lesions EYES: normal, Conjunctiva are pink and non-injected, sclera clear {OROPHARYNX:no exudate, no erythema and lips, buccal mucosa, and tongue normal}  NECK: supple, thyroid normal size, non-tender, without nodularity LYMPH:  no palpable lymphadenopathy in the cervical, axillary {or inguinal} LUNGS: clear to auscultation and percussion with normal breathing effort HEART: regular rate & rhythm and no murmurs and no lower extremity edema ABDOMEN:abdomen soft, non-tender and normal bowel sounds Musculoskeletal:no cyanosis of digits and no clubbing  NEURO: alert & oriented x 3 with fluent speech, no focal motor/sensory deficits  LABORATORY DATA:  I have reviewed the data as listed CBC Latest Ref Rng & Units 06/17/2020 05/06/2020 03/24/2020  WBC 4.0 - 10.5 K/uL 15.0(H) 16.7(H) 17.0(H)  Hemoglobin 13.0 - 17.0 g/dL 16.1 16.0 16.1  Hematocrit 39.0 - 52.0 % 50.9 51.8 51.1  Platelets 150 - 400 K/uL 61(L) 66(L) 81(L)     CMP Latest Ref Rng & Units 06/17/2020 03/24/2020 11/20/2019  Glucose 70 - 99 mg/dL 114(H) 102(H) 123(H)  BUN 8 - 23 mg/dL 30(H) 24(H) 20  Creatinine 0.61 - 1.24 mg/dL 1.25(H) 1.25(H) 1.20  Sodium 135 - 145 mmol/L 139 139 140  Potassium 3.5 - 5.1 mmol/L 4.4 4.2 4.6  Chloride 98 - 111 mmol/L 105 106 105  CO2 22 - 32 mmol/L 26 27 26   Calcium 8.9 - 10.3 mg/dL 9.0 8.7(L) 8.5(L)  Total Protein 6.5 - 8.1 g/dL 5.9(L) 6.0(L) 5.6(L)  Total Bilirubin 0.3 - 1.2 mg/dL 1.0 0.8 0.7  Alkaline Phos 38 - 126 U/L 72 84 88  AST 15 - 41 U/L 21 23 20   ALT 0 - 44 U/L 13 16 16       RADIOGRAPHIC STUDIES: I have personally reviewed the radiological images as listed and agreed with the findings in the report. No results found.   ASSESSMENT & PLAN:  [name] is a 87 y.o. male with   1. Polycythemia vera diagnosed in 1997, JAK2 mutation positive.  -We  previously reviewed the nature history of PV, most people do very well, some people would develop myelofibrosis or leukemia late on. The major complication is thrombosis. -He has been on Hydrea 500 mg daily for 4-5 yearswith dose adjustment as needed. He is currently on Hydrea500mg  bid on Mondays and continue 500mg  daily for the rest of week. -We will continue phlebotomy if hematocrit more than 47%(instead of 45% due to his advanced age). He has needed more frequently while on lower dose Hydrea.  -Labs reviewed, CBC and CMP WNL except WBC 17, plt 81K, ANC 14.4, BG 102, Cr 1.25, Ca 8.7, protein 6.  -Will proceed with phlebotomy today. He is reluctant to increase dose of Hydrea given increased risk of ulcers again.  -Will monitor closely with labs and phlebotomy every 6 week with more frequent Phlebotomies will check iron level. -F/u in 4-5 months    2.RLE DVT, LLE DVT in 02/2015,  likely secondary to #1 -Continue Xarelto indefinitely, due to recurrent DVT. He can use Vit C to help bruising  -He knows to be careful about fall and injury -He had vascular surgery on his left leg on 09/04/16 for poor circulation by Dr. Hart Rochester.  3. Thrombocytopenia, secondary to hydrea  -Hydrea has been dose adjusted as needed. -Goal PLT above 70-80K. -Mild and stable currently   4. Left lower lateral ankle ulcer (resolved) -Onset in early 2021 after bumping legs. Has taken 6 months to heal.    PLAN: -Labs reviewed, Hct 51.5%. Proceed with Phlebotomy today  -Continue hydrea 500mg  daily -Lab every 6 weeks with phlebotomy for Hct >47% -F/u in 4-5 months    No problem-specific Assessment & Plan notes found for this encounter.   No orders of the defined types were placed in this encounter.  All questions were answered. The patient knows to call the clinic with any problems, questions or concerns. No barriers to learning was detected. The total time spent in the appointment was {CHL ONC TIME  VISIT - .     [id number]} 07/21/2020   09/18/2020, am acting as scribe for Rogelia Rohrer, MD.   {Add scribe attestation statement}

## 2020-07-23 ENCOUNTER — Other Ambulatory Visit: Payer: Medicare Other

## 2020-07-23 ENCOUNTER — Ambulatory Visit: Payer: Medicare Other | Admitting: Hematology

## 2020-07-23 ENCOUNTER — Ambulatory Visit: Payer: Medicare Other

## 2020-07-29 ENCOUNTER — Ambulatory Visit: Payer: Medicare Other | Admitting: Hematology

## 2020-07-29 ENCOUNTER — Other Ambulatory Visit: Payer: Medicare Other

## 2020-07-29 ENCOUNTER — Ambulatory Visit: Payer: Medicare Other

## 2020-07-31 DIAGNOSIS — Z23 Encounter for immunization: Secondary | ICD-10-CM | POA: Diagnosis not present

## 2020-08-04 ENCOUNTER — Inpatient Hospital Stay: Payer: Medicare Other | Admitting: Nurse Practitioner

## 2020-08-04 ENCOUNTER — Inpatient Hospital Stay: Payer: Medicare Other

## 2020-08-04 ENCOUNTER — Inpatient Hospital Stay: Payer: Medicare Other | Attending: Hematology

## 2020-08-18 ENCOUNTER — Other Ambulatory Visit: Payer: Self-pay | Admitting: Nurse Practitioner

## 2020-08-18 DIAGNOSIS — D45 Polycythemia vera: Secondary | ICD-10-CM

## 2020-08-18 NOTE — Progress Notes (Signed)
[place]   Telephone:(336) [phone] Fax:(336) [phone]   Clinic Follow up Note   Patient Care Team: [name], MD as PCP - General (Internal Medicine) 08/19/2020  CHIEF COMPLAINT: Follow up polycythemia vera   CURRENT THERAPY:  1. Phlebotomy as needed if HCT>47%.  2. Hydrea decreased to 500mg  daily 4-5 days/week since 02/2015; increased to take hydrea 6 days/week starting 02/08/17 (hold on Mondays). Increase to Hydrea 500mg  BID on Mondays and 500mg  once daily for the rest of week starting 12/13/17. Due tothrombocytopeniawe decreased him to 500mg  5 days/week on 01/28/18, hold Mondays and thursdays. [facility]ed to [facility] 500mg  dailyin Jan 2020. Increased to500mg  bid on Mondays and continue 500mg  daily for the rest of weekstarting 07/29/19.Reduce to 500mg  daily in early 08/2019 due to recent ulcer. 3. Xarelto 20 mg started about mid-February 2015 and off after 6 month, restarted again in 02/2015 due to left LE DVT  INTERVAL HISTORY: [name] [name] returns for follow up as scheduled. He was last seen 03/24/20. He continues routine phlebotomy program q6 weeks. Labs 06/17/20 showed plt 61K, hydrea was decreased to 500 mg daily 5x per week, he is compliant. Denies recurrent leg ulcers or bleeding, he does bruise easily. Appetite is normal. Mild fatigue fluctuates, able to do normal activities. Denies fever, chills, cough, chest pain, leg edema, dyspnea, or other concerns.     MEDICAL HISTORY:  Past Medical History:  Diagnosis Date  . Bone marrow disease   . BPH (benign prostatic hypertrophy)   . Depression   . DJD (degenerative joint disease)   . Dyslipidemia   . Gout   . HTN (hypertension)   . Mild aortic stenosis   . Polycythemia vera(238.4)     SURGICAL HISTORY: Past Surgical History:  Procedure Laterality Date  . ENDOVENOUS ABLATION SAPHENOUS VEIN W/ LASER Left 09/04/2016   endovenous laser ablation left greater saphenous vein by [name] Gens MD  . HEMORROIDECTOMY       I have reviewed the social history and family history with the patient and they are unchanged from previous note.  ALLERGIES:  is allergic to amoxicillin and colchicine.  MEDICATIONS:  Current Outpatient Medications  Medication Sig Dispense Refill  . acetaminophen (TYLENOL) 500 MG tablet Take 650 mg by mouth every 6 (six) hours as needed.     Marland Kitchen allopurinol (ZYLOPRIM) 100 MG tablet Take 100 mg by mouth 2 (two) times daily.     . cholecalciferol (VITAMIN D3) 25 MCG (1000 UNIT) tablet Take 1,000 Units by mouth daily.    Marland Kitchen escitalopram (LEXAPRO) 10 MG tablet Take 5 mg by mouth at bedtime.     Marland Kitchen glucosamine-chondroitin 500-400 MG tablet Take 1 tablet by mouth daily.    . hydroxyurea (HYDREA) 500 MG capsule 1 tab daily in the morning except 1 tab twice daily on Mondays (Patient taking differently: 500 mg daily. ) 40 capsule 0  . lisinopril (PRINIVIL,ZESTRIL) 10 MG tablet Take 10 mg by mouth daily.    . Multiple Vitamin (MULTIVITAMIN WITH MINERALS) TABS tablet Take 1 tablet by mouth daily.    Marland Kitchen omega-3 acid ethyl esters (LOVAZA) 1 g capsule Take 1 g by mouth daily.    . rivaroxaban (XARELTO) 20 MG TABS tablet Take 20 mg by mouth daily with supper.    . terazosin (HYTRIN) 5 MG capsule Take 5 mg by mouth at bedtime.    . vitamin E (VITAMIN E) 400 UNIT capsule Take 400 Units by mouth daily.     No current facility-administered  medications for this visit.    PHYSICAL EXAMINATION:  Vitals:   08/19/20 1129  BP: 110/79  Pulse: 77  Resp: 15  Temp: 97.9 F (36.6 C)  SpO2: 96%   Filed Weights   08/19/20 1129  Weight: 169 lb 3.2 oz (76.7 kg)    GENERAL:alert, no distress and comfortable SKIN: multiple healing bruises and abrasions  EYES:  sclera clear LUNGS:  normal breathing effort HEART:  no lower extremity edema or calf tenderness  NEURO: alert & oriented x 3 with fluent speech  LABORATORY DATA:  I have reviewed the data as listed CBC Latest Ref Rng & Units 08/19/2020 06/17/2020  05/06/2020  WBC 4.0 - 10.5 K/uL 24.8(H) 15.0(H) 16.7(H)  Hemoglobin 13.0 - 17.0 g/dL 16.3 16.1 16.0  Hematocrit 39.0 - 52.0 % 53.9(H) 50.9 51.8  Platelets 150 - 400 K/uL 73(L) 61(L) 66(L)     CMP Latest Ref Rng & Units 06/17/2020 03/24/2020 11/20/2019  Glucose 70 - 99 mg/dL 114(H) 102(H) 123(H)  BUN 8 - 23 mg/dL 30(H) 24(H) 20  Creatinine 0.61 - 1.24 mg/dL 1.25(H) 1.25(H) 1.20  Sodium 135 - 145 mmol/L 139 139 140  Potassium 3.5 - 5.1 mmol/L 4.4 4.2 4.6  Chloride 98 - 111 mmol/L 105 106 105  CO2 22 - 32 mmol/L 26 27 26   Calcium 8.9 - 10.3 mg/dL 9.0 8.7(L) 8.5(L)  Total Protein 6.5 - 8.1 g/dL 5.9(L) 6.0(L) 5.6(L)  Total Bilirubin 0.3 - 1.2 mg/dL 1.0 0.8 0.7  Alkaline Phos 38 - 126 U/L 72 84 88  AST 15 - 41 U/L 21 23 20   ALT 0 - 44 U/L 13 16 16       RADIOGRAPHIC STUDIES: I have personally reviewed the radiological images as listed and agreed with the findings in the report. No results found.   ASSESSMENT & PLAN: 87 yo male   1. Polycythemia vera diagnosed in 1997, JAK2 mutation positive, HCT goal 47-50% 2 RLE DVT, LLE DVT in 02/2015, likely secondary to #1 - on xarelto 3. Thrombocytopenia, secondary to hydrea, adjust as needed for goal plt 70-80K  Dispo:  [name] [name] appears stable. He continues hydrea 500 mg po 5 days per week. He tolerates well overall. Labs reviewed. Iron panel is normal. Baseline leukocytosis slightly elevated WBC 24.8. Hct 53.9. PLT 73. No signs of bleeding or thrombosis. He will continue hydrea 500 mg po 5 days per week. He will proceed with phlebotomy today and continue q6 weeks for goal HCT 47-50%. F/up in 4 months.  Orders Placed This Encounter  Procedures  . CMP ([facility] only)    Standing Status:   Standing    Number of Occurrences:   5    Standing Expiration Date:   08/19/2021  . CBC with Differential ([facility] Only)    Standing Status:   Standing    Number of Occurrences:   50    Standing Expiration Date:   08/19/2021  . Iron and TIBC     Standing Status:   Future    Standing Expiration Date:   08/19/2021  . Ferritin    Standing Status:   Future    Standing Expiration Date:   08/19/2021   All questions were answered. The patient knows to call the clinic with any problems, questions or concerns. No barriers to learning were detected.     Alla Feeling, NP 08/19/20

## 2020-08-19 ENCOUNTER — Inpatient Hospital Stay (HOSPITAL_BASED_OUTPATIENT_CLINIC_OR_DEPARTMENT_OTHER): Payer: Medicare Other | Admitting: Nurse Practitioner

## 2020-08-19 ENCOUNTER — Encounter: Payer: Self-pay | Admitting: Nurse Practitioner

## 2020-08-19 ENCOUNTER — Inpatient Hospital Stay: Payer: Medicare Other

## 2020-08-19 ENCOUNTER — Inpatient Hospital Stay: Payer: Medicare Other | Attending: Hematology

## 2020-08-19 ENCOUNTER — Other Ambulatory Visit: Payer: Self-pay

## 2020-08-19 VITALS — BP 110/79 | HR 77 | Temp 97.9°F | Resp 15 | Ht 71.0 in | Wt 169.2 lb

## 2020-08-19 DIAGNOSIS — D45 Polycythemia vera: Secondary | ICD-10-CM | POA: Insufficient documentation

## 2020-08-19 LAB — CBC WITH DIFFERENTIAL/PLATELET
Abs Immature Granulocytes: 0.37 10*3/uL — ABNORMAL HIGH (ref 0.00–0.07)
Basophils Absolute: 0.6 10*3/uL — ABNORMAL HIGH (ref 0.0–0.1)
Basophils Relative: 2 %
Eosinophils Absolute: 0.2 10*3/uL (ref 0.0–0.5)
Eosinophils Relative: 1 %
HCT: 53.9 % — ABNORMAL HIGH (ref 39.0–52.0)
Hemoglobin: 16.3 g/dL (ref 13.0–17.0)
Immature Granulocytes: 2 %
Lymphocytes Relative: 7 %
Lymphs Abs: 1.7 10*3/uL (ref 0.7–4.0)
MCH: 30.9 pg (ref 26.0–34.0)
MCHC: 30.2 g/dL (ref 30.0–36.0)
MCV: 102.3 fL — ABNORMAL HIGH (ref 80.0–100.0)
Monocytes Absolute: 1.3 10*3/uL — ABNORMAL HIGH (ref 0.1–1.0)
Monocytes Relative: 5 %
Neutro Abs: 20.7 10*3/uL — ABNORMAL HIGH (ref 1.7–7.7)
Neutrophils Relative %: 83 %
Platelets: 73 10*3/uL — ABNORMAL LOW (ref 150–400)
RBC: 5.27 MIL/uL (ref 4.22–5.81)
RDW: 17.1 % — ABNORMAL HIGH (ref 11.5–15.5)
WBC: 24.8 10*3/uL — ABNORMAL HIGH (ref 4.0–10.5)
nRBC: 0.7 % — ABNORMAL HIGH (ref 0.0–0.2)

## 2020-08-19 LAB — IRON AND TIBC
Iron: 57 ug/dL (ref 42–163)
Saturation Ratios: 22 % (ref 20–55)
TIBC: 263 ug/dL (ref 202–409)
UIBC: 206 ug/dL (ref 117–376)

## 2020-08-19 LAB — FERRITIN: Ferritin: 50 ng/mL (ref 24–336)

## 2020-08-19 NOTE — Progress Notes (Signed)
Patient presented today for phlebotomy per MD. order.  A 16 gauge needle was placed in the RAC and 505 mls of blood was removed. The catheter was removed fully intact.  Patient remained for 30 min post observation.  Food and beverage provided.  Patient discharged in stable condition

## 2020-08-19 NOTE — Patient Instructions (Signed)
Therapeutic Phlebotomy Therapeutic phlebotomy is the planned removal of blood from a person's body for the purpose of treating a medical condition. The procedure is similar to donating blood. Usually, about a pint (470 mL, or 0.47 L) of blood is removed. The average adult has 9-12 pints (4.3-5.7 L) of blood in the body. Therapeutic phlebotomy may be used to treat the following medical conditions:  Hemochromatosis. This is a condition in which the blood contains too much iron.  Polycythemia vera. This is a condition in which the blood contains too many red blood cells.  Porphyria cutanea tarda. This is a disease in which an important part of hemoglobin is not made properly. It results in the buildup of abnormal amounts of porphyrins in the body.  Sickle cell disease. This is a condition in which the red blood cells form an abnormal crescent shape rather than a round shape. Tell a health care provider about:  Any allergies you have.  All medicines you are taking, including vitamins, herbs, eye drops, creams, and over-the-counter medicines.  Any problems you or family members have had with anesthetic medicines.  Any blood disorders you have.  Any surgeries you have had.  Any medical conditions you have.  Whether you are pregnant or may be pregnant. What are the risks? Generally, this is a safe procedure. However, problems may occur, including:  Nausea or light-headedness.  Low blood pressure (hypotension).  Soreness, bleeding, swelling, or bruising at the needle insertion site.  Infection. What happens before the procedure?  Follow instructions from your health care provider about eating or drinking restrictions.  Ask your health care provider about: ? Changing or stopping your regular medicines. This is especially important if you are taking diabetes medicines or blood thinners (anticoagulants). ? Taking medicines such as aspirin and ibuprofen. These medicines can thin your  blood. Do not take these medicines unless your health care provider tells you to take them. ? Taking over-the-counter medicines, vitamins, herbs, and supplements.  Wear clothing with sleeves that can be raised above the elbow.  Plan to have someone take you home from the hospital or clinic.  You may have a blood sample taken.  Your blood pressure, pulse rate, and breathing rate will be measured. What happens during the procedure?  To lower your risk of infection: ? Your health care team will wash or sanitize their hands. ? Your skin will be cleaned with an antiseptic.  You may be given a medicine to numb the area (local anesthetic).  A tourniquet will be placed on your arm.  A needle will be inserted into one of your veins.  Tubing and a collection bag will be attached to that needle.  Blood will flow through the needle and tubing into the collection bag.  The collection bag will be placed lower than your arm to allow gravity to help the flow of blood into the bag.  You may be asked to open and close your hand slowly and continually during the entire collection.  After the specified amount of blood has been removed from your body, the collection bag and tubing will be clamped.  The needle will be removed from your vein.  Pressure will be held on the site of the needle insertion to stop the bleeding.  A bandage (dressing) will be placed over the needle insertion site. The procedure may vary among health care providers and hospitals.   What happens after the procedure?  Your blood pressure, pulse rate, and breathing rate will   be measured after the procedure.  You will be encouraged to drink fluids.  Your recovery will be assessed and monitored.  You can return to your normal activities as told by your health care provider. Summary  Therapeutic phlebotomy is the planned removal of blood from a person's body for the purpose of treating a medical condition.  Therapeutic  phlebotomy may be used to treat hemochromatosis, polycythemia vera, porphyria cutanea tarda, or sickle cell disease.  In the procedure, a needle is inserted and about a pint (470 mL, or 0.47 L) of blood is removed. The average adult has 9-12 pints (4.3-5.7 L) of blood in the body.  This is generally a safe procedure, but it can sometimes cause problems such as nausea, light-headedness, or low blood pressure (hypotension). This information is not intended to replace advice given to you by your health care provider. Make sure you discuss any questions you have with your health care provider. Document Revised: 07/19/2017 Document Reviewed: 07/19/2017 Elsevier Patient Education  2021 Elsevier Inc.  

## 2020-08-20 ENCOUNTER — Telehealth: Payer: Self-pay | Admitting: Hematology

## 2020-08-20 NOTE — Telephone Encounter (Signed)
Scheduled appts per 2/3 los. Pt confirmed appt dates and times.  

## 2020-08-26 ENCOUNTER — Telehealth: Payer: Self-pay

## 2020-08-26 NOTE — Telephone Encounter (Signed)
-----   Message from Alla Feeling, NP sent at 08/26/2020  8:16 AM EST ----- Please let him know iron level is in normal range. No new recommendations continue current regimen.  Thanks, Regan Rakers, NP

## 2020-08-26 NOTE — Telephone Encounter (Signed)
Spoke with pt made aware of most recent lab results no new orders or recommendations

## 2020-09-30 ENCOUNTER — Inpatient Hospital Stay: Payer: Medicare Other

## 2020-09-30 ENCOUNTER — Inpatient Hospital Stay: Payer: Medicare Other | Attending: Hematology

## 2020-09-30 ENCOUNTER — Other Ambulatory Visit: Payer: Self-pay

## 2020-09-30 VITALS — BP 102/57 | HR 85 | Temp 98.5°F | Resp 16

## 2020-09-30 DIAGNOSIS — D45 Polycythemia vera: Secondary | ICD-10-CM

## 2020-09-30 LAB — CBC WITH DIFFERENTIAL (CANCER CENTER ONLY)
Abs Immature Granulocytes: 0.33 10*3/uL — ABNORMAL HIGH (ref 0.00–0.07)
Basophils Absolute: 0.4 10*3/uL — ABNORMAL HIGH (ref 0.0–0.1)
Basophils Relative: 2 %
Eosinophils Absolute: 0.2 10*3/uL (ref 0.0–0.5)
Eosinophils Relative: 1 %
HCT: 52.4 % — ABNORMAL HIGH (ref 39.0–52.0)
Hemoglobin: 15.9 g/dL (ref 13.0–17.0)
Immature Granulocytes: 1 %
Lymphocytes Relative: 5 %
Lymphs Abs: 1.1 10*3/uL (ref 0.7–4.0)
MCH: 30.6 pg (ref 26.0–34.0)
MCHC: 30.3 g/dL (ref 30.0–36.0)
MCV: 100.8 fL — ABNORMAL HIGH (ref 80.0–100.0)
Monocytes Absolute: 1.1 10*3/uL — ABNORMAL HIGH (ref 0.1–1.0)
Monocytes Relative: 5 %
Neutro Abs: 19.8 10*3/uL — ABNORMAL HIGH (ref 1.7–7.7)
Neutrophils Relative %: 86 %
Platelet Count: 73 10*3/uL — ABNORMAL LOW (ref 150–400)
RBC: 5.2 MIL/uL (ref 4.22–5.81)
RDW: 18.5 % — ABNORMAL HIGH (ref 11.5–15.5)
WBC Count: 22.9 10*3/uL — ABNORMAL HIGH (ref 4.0–10.5)
nRBC: 0.8 % — ABNORMAL HIGH (ref 0.0–0.2)

## 2020-09-30 LAB — CMP (CANCER CENTER ONLY)
ALT: 16 U/L (ref 0–44)
AST: 21 U/L (ref 15–41)
Albumin: 3.6 g/dL (ref 3.5–5.0)
Alkaline Phosphatase: 78 U/L (ref 38–126)
Anion gap: 8 (ref 5–15)
BUN: 30 mg/dL — ABNORMAL HIGH (ref 8–23)
CO2: 24 mmol/L (ref 22–32)
Calcium: 8.6 mg/dL — ABNORMAL LOW (ref 8.9–10.3)
Chloride: 108 mmol/L (ref 98–111)
Creatinine: 1.34 mg/dL — ABNORMAL HIGH (ref 0.61–1.24)
GFR, Estimated: 49 mL/min — ABNORMAL LOW (ref >60)
Glucose, Bld: 139 mg/dL — ABNORMAL HIGH (ref 70–99)
Potassium: 4.3 mmol/L (ref 3.5–5.1)
Sodium: 140 mmol/L (ref 135–145)
Total Bilirubin: 1 mg/dL (ref 0.3–1.2)
Total Protein: 5.8 g/dL — ABNORMAL LOW (ref 6.5–8.1)

## 2020-09-30 NOTE — Patient Instructions (Signed)

## 2020-11-10 ENCOUNTER — Other Ambulatory Visit: Payer: Self-pay | Admitting: Hematology

## 2020-11-11 ENCOUNTER — Inpatient Hospital Stay: Payer: Medicare Other | Attending: Hematology

## 2020-11-11 ENCOUNTER — Inpatient Hospital Stay: Payer: Medicare Other

## 2020-11-11 ENCOUNTER — Other Ambulatory Visit: Payer: Medicare Other

## 2020-11-11 ENCOUNTER — Other Ambulatory Visit: Payer: Self-pay

## 2020-11-11 VITALS — BP 94/58 | HR 79 | Temp 98.0°F | Resp 18

## 2020-11-11 DIAGNOSIS — D751 Secondary polycythemia: Secondary | ICD-10-CM | POA: Diagnosis not present

## 2020-11-11 DIAGNOSIS — D45 Polycythemia vera: Secondary | ICD-10-CM

## 2020-11-11 LAB — CBC WITH DIFFERENTIAL (CANCER CENTER ONLY)
Abs Immature Granulocytes: 0.37 10*3/uL — ABNORMAL HIGH (ref 0.00–0.07)
Basophils Absolute: 0.5 10*3/uL — ABNORMAL HIGH (ref 0.0–0.1)
Basophils Relative: 2 %
Eosinophils Absolute: 0.2 10*3/uL (ref 0.0–0.5)
Eosinophils Relative: 1 %
HCT: 51.1 % (ref 39.0–52.0)
Hemoglobin: 15.6 g/dL (ref 13.0–17.0)
Immature Granulocytes: 2 %
Lymphocytes Relative: 7 %
Lymphs Abs: 1.6 10*3/uL (ref 0.7–4.0)
MCH: 30.2 pg (ref 26.0–34.0)
MCHC: 30.5 g/dL (ref 30.0–36.0)
MCV: 98.8 fL (ref 80.0–100.0)
Monocytes Absolute: 1 10*3/uL (ref 0.1–1.0)
Monocytes Relative: 4 %
Neutro Abs: 20.8 10*3/uL — ABNORMAL HIGH (ref 1.7–7.7)
Neutrophils Relative %: 84 %
Platelet Count: 61 10*3/uL — ABNORMAL LOW (ref 150–400)
RBC: 5.17 MIL/uL (ref 4.22–5.81)
RDW: 19.1 % — ABNORMAL HIGH (ref 11.5–15.5)
WBC Count: 24.5 10*3/uL — ABNORMAL HIGH (ref 4.0–10.5)
nRBC: 0.9 % — ABNORMAL HIGH (ref 0.0–0.2)

## 2020-11-11 NOTE — Patient Instructions (Signed)

## 2020-11-25 ENCOUNTER — Telehealth: Payer: Self-pay | Admitting: Nurse Practitioner

## 2020-11-25 NOTE — Telephone Encounter (Signed)
CBC with up-trending WBC/ANC >20 and thrombocytopenia. He continues hydrea 5 x per week, tolerating well. Denies bleeding, fever, chills, or other new concerns. Left calf is always sore, no swelling. He has a sore on one ankle that he's been "doctoring," but he is not concerned, denies signs of infection. He was offered a follow up today but declined.   I recommend to move 6/9 appointments sooner, he is agreeable. I will send schedule message. He knows to call before then with any new/worsening concerns.   Cira Rue, NP

## 2020-11-29 ENCOUNTER — Telehealth: Payer: Self-pay | Admitting: Hematology

## 2020-11-29 NOTE — Telephone Encounter (Signed)
R/s appt erp 5/12 sch msg. Pt aware.

## 2020-12-03 NOTE — Progress Notes (Incomplete)
[place]   [phone] [phone]   Clinic Follow up Note   Patient Care Team: [name], MD as PCP - General (Internal Medicine)  Date of Service:  12/03/2020  CHIEF COMPLAINT: Follow up for Polycythemia Vera  CURRENT THERAPY:  1. Phlebotomy as needed if HCT>47%.  2. Hydrea decreased to 500mg  daily 4-5 days/week since 02/2015; increased to take hydrea 6 days/week starting 02/08/17 (hold on Mondays). Increase to Hydrea 500mg  BID on Mondays and 500mg  once daily for the rest of week starting 12/13/17. Due tothrombocytopeniawe decreased him to 500mg  5 days/week on 01/28/18, hold Mondays and thursdays. Returned to [facility] 500mg  dailyin Jan 2020. Increased to500mg  bid on Mondays and continue 500mg  daily for the rest of weekstarting 07/29/19.Reduce to 500mg  daily in early 08/2019 due to recent ulcer. 3. Xarelto 20 mg started about mid-February 2015 and off after 6 month, restarted again in 02/2015 due to left LE DVT  INTERVAL HISTORY: *** [name] [name] is here for a follow up of PV. She was last seen by me 8 months ago and seen by NP Lacie in interim. He presents to the clinic alone.    REVIEW OF SYSTEMS:  *** Constitutional: Denies fevers, chills or abnormal weight loss Eyes: Denies blurriness of vision Ears, nose, mouth, throat, and face: Denies mucositis or sore throat Respiratory: Denies cough, dyspnea or wheezes Cardiovascular: Denies palpitation, chest discomfort or lower extremity swelling Gastrointestinal:  Denies nausea, heartburn or change in bowel habits Skin: Denies abnormal skin rashes Lymphatics: Denies new lymphadenopathy or easy bruising Neurological:Denies numbness, tingling or new weaknesses Behavioral/Psych: Mood is stable, no new changes  All other systems were reviewed with the patient and are negative.  MEDICAL HISTORY:  Past Medical History:  Diagnosis Date  . Bone marrow disease   . BPH (benign prostatic  hypertrophy)   . Depression   . DJD (degenerative joint disease)   . Dyslipidemia   . Gout   . HTN (hypertension)   . Mild aortic stenosis   . Polycythemia vera(238.4)     SURGICAL HISTORY: Past Surgical History:  Procedure Laterality Date  . ENDOVENOUS ABLATION SAPHENOUS VEIN W/ LASER Left 09/04/2016   endovenous laser ablation left greater saphenous vein by Tinnie Gens MD  . HEMORROIDECTOMY      I have reviewed the social history and family history with the patient and they are unchanged from previous note.  ALLERGIES:  is allergic to amoxicillin and colchicine.  MEDICATIONS:  Current Outpatient Medications  Medication Sig Dispense Refill  . acetaminophen (TYLENOL) 500 MG tablet Take 650 mg by mouth every 6 (six) hours as needed.     Marland Kitchen allopurinol (ZYLOPRIM) 100 MG tablet Take 100 mg by mouth 2 (two) times daily.     . cholecalciferol (VITAMIN D3) 25 MCG (1000 UNIT) tablet Take 1,000 Units by mouth daily.    Marland Kitchen escitalopram (LEXAPRO) 10 MG tablet Take 5 mg by mouth at bedtime.     Marland Kitchen glucosamine-chondroitin 500-400 MG tablet Take 1 tablet by mouth daily.    . hydroxyurea (HYDREA) 500 MG capsule 1 tab daily in the morning except 1 tab twice daily on Mondays (Patient taking differently: 500 mg daily. ) 40 capsule 0  . lisinopril (PRINIVIL,ZESTRIL) 10 MG tablet Take 10 mg by mouth daily.    . Multiple Vitamin (MULTIVITAMIN WITH MINERALS) TABS tablet Take 1 tablet by mouth daily.    Marland Kitchen omega-3 acid ethyl esters (LOVAZA) 1 g capsule Take 1 g by mouth  daily.    . rivaroxaban (XARELTO) 20 MG TABS tablet Take 20 mg by mouth daily with supper.    . terazosin (HYTRIN) 5 MG capsule Take 5 mg by mouth at bedtime.    . vitamin E (VITAMIN E) 400 UNIT capsule Take 400 Units by mouth daily.     No current facility-administered medications for this visit.    PHYSICAL EXAMINATION: ECOG PERFORMANCE STATUS: {CHL ONC ECOG [phone]}  There were no vitals filed for this visit. There were  no vitals filed for this visit. *** GENERAL:alert, no distress and comfortable SKIN: skin color, texture, turgor are normal, no rashes or significant lesions EYES: normal, Conjunctiva are pink and non-injected, sclera clear {OROPHARYNX:no exudate, no erythema and lips, buccal mucosa, and tongue normal}  NECK: supple, thyroid normal size, non-tender, without nodularity LYMPH:  no palpable lymphadenopathy in the cervical, axillary {or inguinal} LUNGS: clear to auscultation and percussion with normal breathing effort HEART: regular rate & rhythm and no murmurs and no lower extremity edema ABDOMEN:abdomen soft, non-tender and normal bowel sounds Musculoskeletal:no cyanosis of digits and no clubbing  NEURO: alert & oriented x 3 with fluent speech, no focal motor/sensory deficits  LABORATORY DATA:  I have reviewed the data as listed CBC Latest Ref Rng & Units 11/11/2020 09/30/2020 08/19/2020  WBC 4.0 - 10.5 K/uL 24.5(H) 22.9(H) 24.8(H)  Hemoglobin 13.0 - 17.0 g/dL 15.6 15.9 16.3  Hematocrit 39.0 - 52.0 % 51.1 52.4(H) 53.9(H)  Platelets 150 - 400 K/uL 61(L) 73(L) 73(L)     CMP Latest Ref Rng & Units 09/30/2020 06/17/2020 03/24/2020  Glucose 70 - 99 mg/dL 139(H) 114(H) 102(H)  BUN 8 - 23 mg/dL 30(H) 30(H) 24(H)  Creatinine 0.61 - 1.24 mg/dL 1.34(H) 1.25(H) 1.25(H)  Sodium 135 - 145 mmol/L 140 139 139  Potassium 3.5 - 5.1 mmol/L 4.3 4.4 4.2  Chloride 98 - 111 mmol/L 108 105 106  CO2 22 - 32 mmol/L 24 26 27   Calcium 8.9 - 10.3 mg/dL 8.6(L) 9.0 8.7(L)  Total Protein 6.5 - 8.1 g/dL 5.8(L) 5.9(L) 6.0(L)  Total Bilirubin 0.3 - 1.2 mg/dL 1.0 1.0 0.8  Alkaline Phos 38 - 126 U/L 78 72 84  AST 15 - 41 U/L 21 21 23   ALT 0 - 44 U/L 16 13 16       RADIOGRAPHIC STUDIES: I have personally reviewed the radiological images as listed and agreed with the findings in the report. No results found.   ASSESSMENT & PLAN:  [name] [name] is a 87 y.o. male with   1. Polycythemia vera diagnosed in 1997, JAK2  mutation positive.  -We previously reviewed the nature history of PV, most people do very well, some people would develop myelofibrosis or leukemia late on. The major complication is thrombosis. -He has been on Hydrea 500 mg daily for 4-5 yearswith dose adjustment as needed. He is currently on Hydrea500mg  bid on Mondays and continue 500mg  daily for the rest of week. -We will continue phlebotomy if hematocrit more than 47%(instead of 45% due to his advanced age). He has needed more frequently while on lower dose Hydrea.  -Labs reviewed, CBC and CMP WNL except WBC 17, plt 81K, ANC 14.4, BG 102, Cr 1.25, Ca 8.7, protein 6.  -Will proceed with phlebotomy today. He is reluctant to increase dose of Hydrea given increased risk of ulcers again.  -Will monitor closely with labs and phlebotomy every 6 week with more frequent Phlebotomies will check iron level. -F/u in 4-5 months    2.RLE  DVT, LLE DVT in 02/2015, likely secondary to #1 -Continue Xarelto indefinitely, due to recurrent DVT. He can use Vit C to help bruising  -He knows to be careful about fall and injury -He had vascular surgery on his left leg on 09/04/16 for poor circulation by Dr. Kellie Simmering.  3. Thrombocytopenia, secondary to hydrea  -Hydrea has been dose adjusted as needed. -Goal PLT above 70-80K. -Mild and stable currently   4. Left lower lateral ankle ulcer (resolved) -Onset in early 2021 after bumping legs. Has taken 6 months to heal.    PLAN: -Labs reviewed, Hct 51.5%. Proceed with Phlebotomy today  -Continue hydrea 500mg  daily -Lab every 6 weeks with phlebotomy for Hct >47% -F/u in 4-5 months     No problem-specific Assessment & Plan notes found for this encounter.   No orders of the defined types were placed in this encounter.  All questions were answered. The patient knows to call the clinic with any problems, questions or concerns. No barriers to learning was detected. The total time spent in the  appointment was {CHL ONC TIME VISIT - VOPFY:[id number]}.     Joslyn Devon 12/03/2020   Oneal Deputy, am acting as scribe for Truitt Merle, MD.   {Add scribe attestation statement}

## 2020-12-08 ENCOUNTER — Inpatient Hospital Stay: Payer: Medicare Other | Admitting: Hematology

## 2020-12-08 ENCOUNTER — Inpatient Hospital Stay: Payer: Medicare Other

## 2020-12-08 ENCOUNTER — Telehealth: Payer: Self-pay | Admitting: Hematology

## 2020-12-08 DIAGNOSIS — D45 Polycythemia vera: Secondary | ICD-10-CM

## 2020-12-08 NOTE — Progress Notes (Signed)
[place]   [phone]) [phone] [phone]   Clinic Follow up Note   Patient Care Team: [name], MD as PCP - General (Internal Medicine)  Date of Service:  12/10/2020  CHIEF COMPLAINT: Follow up for Polycythemia Vera   CURRENT THERAPY: 1. Phlebotomy as needed if HCT>47%.  2. Hydrea decreased to 500mg  daily 4-5 days/week since 02/2015; increased to take hydrea 6 days/week starting 02/08/17 (hold on Mondays). Increase to Hydrea 500mg  BID on Mondays and 500mg  once daily for the rest of week starting 12/13/17. Due tothrombocytopeniawe decreased him to 500mg  5 days/week on 01/28/18, hold Mondays and thursdays. Returned to [facility] 500mg  dailyin Jan 2020. Increased to500mg  bid on Mondays and continue 500mg  daily for the rest of weekstarting 07/29/19.Reduce to 500mg  daily in early 08/2019 due to recent ulcer.Reduced to 500mg  daily 5 days a week (early 2022).  3. Xarelto 20 mg started about mid-February 2015 and off after 6 month, restarted again in 02/2015 due to left LE DVT  INTERVAL HISTORY:  [name] [name] is here for a follow up of PV. He was last seen by me 8 months ago and seen by NP Laice 3 months ago in interim. He presents to the clinic with his son. He has skin wart on his left lower lateral arm. He notes this has bled. He continues to bruise easily on extremities. He notes he used wheelchair today because of distance to my office. He denies recent falls. He notes hip and knee pain and Hernia which bother him. He uses Tylenol as needed. He notes his wife is under total home care for her health issues.     REVIEW OF SYSTEMS:   Constitutional: Denies fevers, chills or abnormal weight loss Eyes: Denies blurriness of vision Ears, nose, mouth, throat, and face: Denies mucositis or sore throat Respiratory: Denies cough, dyspnea or wheezes Cardiovascular: Denies palpitation, chest discomfort or lower extremity swelling Gastrointestinal:  Denies nausea,  heartburn or change in bowel habits Skin: Denies abnormal skin rashes (+) skin wart on his left lower lateral arm Lymphatics: Denies new lymphadenopathy (+) easy bruising Neurological:Denies numbness, tingling or new weaknesses Behavioral/Psych: Mood is stable, no new changes  All other systems were reviewed with the patient and are negative.  MEDICAL HISTORY:  Past Medical History:  Diagnosis Date  . Bone marrow disease   . BPH (benign prostatic hypertrophy)   . Depression   . DJD (degenerative joint disease)   . Dyslipidemia   . Gout   . HTN (hypertension)   . Mild aortic stenosis   . Polycythemia vera(238.4)     SURGICAL HISTORY: Past Surgical History:  Procedure Laterality Date  . ENDOVENOUS ABLATION SAPHENOUS VEIN W/ LASER Left 09/04/2016   endovenous laser ablation left greater saphenous vein by Tinnie Gens MD  . HEMORROIDECTOMY      I have reviewed the social history and family history with the patient and they are unchanged from previous note.  ALLERGIES:  is allergic to amoxicillin and colchicine.  MEDICATIONS:  Current Outpatient Medications  Medication Sig Dispense Refill  . acetaminophen (TYLENOL) 500 MG tablet Take 650 mg by mouth every 6 (six) hours as needed.     Marland Kitchen allopurinol (ZYLOPRIM) 100 MG tablet Take 100 mg by mouth 2 (two) times daily.     . cholecalciferol (VITAMIN D3) 25 MCG (1000 UNIT) tablet Take 1,000 Units by mouth daily.    Marland Kitchen escitalopram (LEXAPRO) 10 MG tablet Take 5 mg by mouth at bedtime.     Marland Kitchen  glucosamine-chondroitin 500-400 MG tablet Take 1 tablet by mouth daily.    . hydroxyurea (HYDREA) 500 MG capsule 1 tab daily in the morning except 1 tab twice daily on Mondays (Patient taking differently: 500 mg daily. ) 40 capsule 0  . lisinopril (PRINIVIL,ZESTRIL) 10 MG tablet Take 10 mg by mouth daily.    . Multiple Vitamin (MULTIVITAMIN WITH MINERALS) TABS tablet Take 1 tablet by mouth daily.    Marland Kitchen omega-3 acid ethyl esters (LOVAZA) 1 g capsule  Take 1 g by mouth daily.    . rivaroxaban (XARELTO) 20 MG TABS tablet Take 20 mg by mouth daily with supper.    . terazosin (HYTRIN) 5 MG capsule Take 5 mg by mouth at bedtime.    . vitamin E (VITAMIN E) 400 UNIT capsule Take 400 Units by mouth daily.     No current facility-administered medications for this visit.    PHYSICAL EXAMINATION: ECOG PERFORMANCE STATUS: 3 - Symptomatic, >50% confined to bed  Vitals:   12/10/20 1108  BP: 119/71  Pulse: 89  Resp: 18  Temp: 97.6 F (36.4 C)  SpO2: 93%   Filed Weights   12/10/20 1108  Weight: 162 lb 14.4 oz (73.9 kg)    Due to COVID19 we will limit examination to appearance. Patient had no complaints.  GENERAL:alert, no distress and comfortable SKIN: skin color normal, no rashes (+) Significant extremity bruising (+) Skin wart with dried blood on left lower arm. EYES: normal, Conjunctiva are pink and non-injected, sclera clear  NEURO: alert & oriented x 3 with fluent speech   LABORATORY DATA:  I have reviewed the data as listed CBC Latest Ref Rng & Units 12/10/2020 11/11/2020 09/30/2020  WBC 4.0 - 10.5 K/uL 25.3(H) 24.5(H) 22.9(H)  Hemoglobin 13.0 - 17.0 g/dL 15.6 15.6 15.9  Hematocrit 39.0 - 52.0 % 51.2 51.1 52.4(H)  Platelets 150 - 400 K/uL 61(L) 61(L) 73(L)     CMP Latest Ref Rng & Units 09/30/2020 06/17/2020 03/24/2020  Glucose 70 - 99 mg/dL 139(H) 114(H) 102(H)  BUN 8 - 23 mg/dL 30(H) 30(H) 24(H)  Creatinine 0.61 - 1.24 mg/dL 1.34(H) 1.25(H) 1.25(H)  Sodium 135 - 145 mmol/L 140 139 139  Potassium 3.5 - 5.1 mmol/L 4.3 4.4 4.2  Chloride 98 - 111 mmol/L 108 105 106  CO2 22 - 32 mmol/L 24 26 27   Calcium 8.9 - 10.3 mg/dL 8.6(L) 9.0 8.7(L)  Total Protein 6.5 - 8.1 g/dL 5.8(L) 5.9(L) 6.0(L)  Total Bilirubin 0.3 - 1.2 mg/dL 1.0 1.0 0.8  Alkaline Phos 38 - 126 U/L 78 72 84  AST 15 - 41 U/L 21 21 23   ALT 0 - 44 U/L 16 13 16       RADIOGRAPHIC STUDIES: I have personally reviewed the radiological images as listed and agreed with  the findings in the report. No results found.   ASSESSMENT & PLAN:  [name] [name] is a 87 y.o. male with    1. Polycythemia vera diagnosed in 1997, JAK2 mutation positive.  -We previously reviewed the nature history of PV, most people do very well, some people would develop myelofibrosis or leukemia late on. The major complication is thrombosis. -He has been on Hydrea 500 mg daily for 4-5 yearswith dose adjustment as needed. He is currently on Hydrea500mg  days a week.  -We will continue phlebotomy if hematocrit more than 47%(instead of 45% due to his advanced age). He has needed more frequently while on lower dose Hydrea. He is reluctant to increase dose of Hydrea  given increased risk of ulcers again.  -Labs shows his Hct intermittently increases and platelets continue to decrease. Has been stable this past month with Hct 51.2%, Hg 15.6, plt 61K, ANC 21.4 (12/10/20). He will proceed with phlebotomy today. I also offered IV Fluids, he declined today. I recommend he drink plenty of water.   -I discussed if we checked his labs more often, would show he needs more frequent phlebotomies than once monthly. Due to his advanced age, I will continue lab and phlebotomy monthly  -I discussed the option of Interferon injections for better management and encouraged him to consider. I reviewed side effects with him, especially fatigue, flulike symptoms, he is concerned about potential side effect and declined for now. We will reconsider for later.  -Will continue Hydrea 500mg  5 days a week. Will monitor closely with labs and phlebotomy every 4 weeks. -If platelet counts drops further, will back off Hydrea dose -F/u in 3 months    2.RLE DVT, LLE DVT in 02/2015, likely secondary to #1 -Continue Xarelto indefinitely, due to recurrent DVT. He can use Vit C to help bruising  -He knows to be careful about fall and injury -He had vascular surgery on his left leg on 09/04/16 for poor circulation by Dr.  Kellie Simmering.  3. Thrombocytopenia, secondary to hydrea  -Hydrea has been dose adjusted as needed. -Goal PLT above 70-80K. -His plt has further decreased. Plt 61K today (12/10/20). I discussed this is likely related to his Hydrea.   4. Comorbidities: Hip and knee pain, PAD, recent skin wart -He notes he uses Tylenol as needed for joint pain. He knows to avoid NSAIDs. I discussed starting Tumeric supplement to help his joint pain.  -He has H/o Left lower lateral ankle ulcer in 2021 which resolved after 6 months of slow healing -He has a recent left lower lateral arm skin warm. I recommend he consult with dermatologist and I do not recommend invasive procedures for this given thrombocytopenia.     PLAN: -Labs reviewed, Hct 51.2%. Proceed with Phlebotomy today  -Continue hydrea 500mg  daily, 5 days a week.  -Lab every 4 weeks with phlebotomy for Hct >47% -F/u in 3 months    No problem-specific Assessment & Plan notes found for this encounter.   No orders of the defined types were placed in this encounter.  All questions were answered. The patient knows to call the clinic with any problems, questions or concerns. No barriers to learning was detected. The total time spent in the appointment was 30 minutes.     Truitt Merle, MD 12/10/2020   I, Joslyn Devon, am acting as scribe for Truitt Merle, MD.   I have reviewed the above documentation for accuracy and completeness, and I agree with the above.

## 2020-12-08 NOTE — Telephone Encounter (Signed)
R/s appts per 5/25 sch msg. Pt aware.  

## 2020-12-10 ENCOUNTER — Inpatient Hospital Stay: Payer: Medicare Other | Attending: Hematology

## 2020-12-10 ENCOUNTER — Other Ambulatory Visit: Payer: Medicare Other

## 2020-12-10 ENCOUNTER — Encounter: Payer: Self-pay | Admitting: Hematology

## 2020-12-10 ENCOUNTER — Ambulatory Visit: Payer: Medicare Other | Admitting: Hematology

## 2020-12-10 ENCOUNTER — Ambulatory Visit: Payer: Medicare Other

## 2020-12-10 ENCOUNTER — Inpatient Hospital Stay: Payer: Medicare Other

## 2020-12-10 ENCOUNTER — Other Ambulatory Visit: Payer: Self-pay

## 2020-12-10 ENCOUNTER — Inpatient Hospital Stay (HOSPITAL_BASED_OUTPATIENT_CLINIC_OR_DEPARTMENT_OTHER): Payer: Medicare Other | Admitting: Hematology

## 2020-12-10 VITALS — BP 119/71 | HR 89 | Temp 97.6°F | Resp 18 | Ht 71.0 in | Wt 162.9 lb

## 2020-12-10 DIAGNOSIS — D45 Polycythemia vera: Secondary | ICD-10-CM | POA: Diagnosis not present

## 2020-12-10 LAB — CBC WITH DIFFERENTIAL/PLATELET
Abs Immature Granulocytes: 0.27 10*3/uL — ABNORMAL HIGH (ref 0.00–0.07)
Basophils Absolute: 0.5 10*3/uL — ABNORMAL HIGH (ref 0.0–0.1)
Basophils Relative: 2 %
Eosinophils Absolute: 0.2 10*3/uL (ref 0.0–0.5)
Eosinophils Relative: 1 %
HCT: 51.2 % (ref 39.0–52.0)
Hemoglobin: 15.6 g/dL (ref 13.0–17.0)
Immature Granulocytes: 1 %
Lymphocytes Relative: 6 %
Lymphs Abs: 1.6 10*3/uL (ref 0.7–4.0)
MCH: 30.4 pg (ref 26.0–34.0)
MCHC: 30.5 g/dL (ref 30.0–36.0)
MCV: 99.8 fL (ref 80.0–100.0)
Monocytes Absolute: 1.2 10*3/uL — ABNORMAL HIGH (ref 0.1–1.0)
Monocytes Relative: 5 %
Neutro Abs: 21.4 10*3/uL — ABNORMAL HIGH (ref 1.7–7.7)
Neutrophils Relative %: 85 %
Platelets: 61 10*3/uL — ABNORMAL LOW (ref 150–400)
RBC: 5.13 MIL/uL (ref 4.22–5.81)
RDW: 18.9 % — ABNORMAL HIGH (ref 11.5–15.5)
WBC: 25.3 10*3/uL — ABNORMAL HIGH (ref 4.0–10.5)
nRBC: 0.7 % — ABNORMAL HIGH (ref 0.0–0.2)

## 2020-12-10 NOTE — Progress Notes (Signed)
[name] presents today for phlebotomy per MD orders. Phlebotomy procedure started at 1136 via 16G to R AC and ended at 1142. 526 grams removed. IV needle removed intact. Patient tolerated procedure well, offered food and drink, and observed for 30 minutes after procedure without any incident.

## 2020-12-10 NOTE — Patient Instructions (Signed)

## 2020-12-14 ENCOUNTER — Telehealth: Payer: Self-pay | Admitting: Hematology

## 2020-12-14 NOTE — Telephone Encounter (Signed)
Scheduled follow-up appointments per 5/27 los. Patient is aware. Mailed calendar.

## 2020-12-21 ENCOUNTER — Encounter: Payer: Self-pay | Admitting: Internal Medicine

## 2020-12-23 ENCOUNTER — Other Ambulatory Visit: Payer: Medicare Other

## 2020-12-23 ENCOUNTER — Ambulatory Visit: Payer: Medicare Other | Admitting: Hematology

## 2020-12-23 ENCOUNTER — Inpatient Hospital Stay: Payer: Medicare Other

## 2021-01-10 ENCOUNTER — Inpatient Hospital Stay: Payer: Medicare Other

## 2021-01-10 ENCOUNTER — Other Ambulatory Visit: Payer: Self-pay

## 2021-01-10 ENCOUNTER — Inpatient Hospital Stay: Payer: Medicare Other | Attending: Hematology

## 2021-01-10 VITALS — BP 85/62 | HR 89 | Temp 97.7°F | Resp 16

## 2021-01-10 DIAGNOSIS — D45 Polycythemia vera: Secondary | ICD-10-CM

## 2021-01-10 LAB — CMP (CANCER CENTER ONLY)
ALT: 11 U/L (ref 0–44)
AST: 19 U/L (ref 15–41)
Albumin: 3.4 g/dL — ABNORMAL LOW (ref 3.5–5.0)
Alkaline Phosphatase: 78 U/L (ref 38–126)
Anion gap: 10 (ref 5–15)
BUN: 33 mg/dL — ABNORMAL HIGH (ref 8–23)
CO2: 23 mmol/L (ref 22–32)
Calcium: 8.7 mg/dL — ABNORMAL LOW (ref 8.9–10.3)
Chloride: 106 mmol/L (ref 98–111)
Creatinine: 1.31 mg/dL — ABNORMAL HIGH (ref 0.61–1.24)
GFR, Estimated: 50 mL/min — ABNORMAL LOW (ref >60)
Glucose, Bld: 152 mg/dL — ABNORMAL HIGH (ref 70–99)
Potassium: 4.3 mmol/L (ref 3.5–5.1)
Sodium: 139 mmol/L (ref 135–145)
Total Bilirubin: 1.1 mg/dL (ref 0.3–1.2)
Total Protein: 5.7 g/dL — ABNORMAL LOW (ref 6.5–8.1)

## 2021-01-10 LAB — CBC WITH DIFFERENTIAL (CANCER CENTER ONLY)
Abs Immature Granulocytes: 0.25 10*3/uL — ABNORMAL HIGH (ref 0.00–0.07)
Basophils Absolute: 0.5 10*3/uL — ABNORMAL HIGH (ref 0.0–0.1)
Basophils Relative: 3 %
Eosinophils Absolute: 0.2 10*3/uL (ref 0.0–0.5)
Eosinophils Relative: 1 %
HCT: 49.4 % (ref 39.0–52.0)
Hemoglobin: 15.3 g/dL (ref 13.0–17.0)
Immature Granulocytes: 1 %
Lymphocytes Relative: 7 %
Lymphs Abs: 1.3 10*3/uL (ref 0.7–4.0)
MCH: 30.3 pg (ref 26.0–34.0)
MCHC: 31 g/dL (ref 30.0–36.0)
MCV: 97.8 fL (ref 80.0–100.0)
Monocytes Absolute: 1.2 10*3/uL — ABNORMAL HIGH (ref 0.1–1.0)
Monocytes Relative: 7 %
Neutro Abs: 14.7 10*3/uL — ABNORMAL HIGH (ref 1.7–7.7)
Neutrophils Relative %: 81 %
Platelet Count: 60 10*3/uL — ABNORMAL LOW (ref 150–400)
RBC: 5.05 MIL/uL (ref 4.22–5.81)
RDW: 19.2 % — ABNORMAL HIGH (ref 11.5–15.5)
WBC Count: 18.1 10*3/uL — ABNORMAL HIGH (ref 4.0–10.5)
nRBC: 1.2 % — ABNORMAL HIGH (ref 0.0–0.2)

## 2021-01-10 NOTE — Patient Instructions (Signed)

## 2021-01-10 NOTE — Progress Notes (Signed)
Phlebotomy procedure started at 1027 via 16G to R AC and ended at 1032. 506 grams removed. IV needle removed intact. Patient tolerated procedure well, offered food and drink, and observed for 30 minutes after procedure without any incident.

## 2021-02-09 ENCOUNTER — Inpatient Hospital Stay: Payer: Medicare Other

## 2021-02-09 ENCOUNTER — Inpatient Hospital Stay: Payer: Medicare Other | Attending: Hematology

## 2021-02-09 ENCOUNTER — Other Ambulatory Visit: Payer: Self-pay

## 2021-02-09 VITALS — BP 101/60 | HR 71 | Temp 98.2°F | Resp 18

## 2021-02-09 DIAGNOSIS — D45 Polycythemia vera: Secondary | ICD-10-CM

## 2021-02-09 LAB — CBC WITH DIFFERENTIAL (CANCER CENTER ONLY)
Abs Immature Granulocytes: 0.24 10*3/uL — ABNORMAL HIGH (ref 0.00–0.07)
Basophils Absolute: 0.6 10*3/uL — ABNORMAL HIGH (ref 0.0–0.1)
Basophils Relative: 3 %
Eosinophils Absolute: 0.2 10*3/uL (ref 0.0–0.5)
Eosinophils Relative: 1 %
HCT: 49.3 % (ref 39.0–52.0)
Hemoglobin: 14.9 g/dL (ref 13.0–17.0)
Immature Granulocytes: 1 %
Lymphocytes Relative: 7 %
Lymphs Abs: 1.3 10*3/uL (ref 0.7–4.0)
MCH: 29.5 pg (ref 26.0–34.0)
MCHC: 30.2 g/dL (ref 30.0–36.0)
MCV: 97.6 fL (ref 80.0–100.0)
Monocytes Absolute: 1.1 10*3/uL — ABNORMAL HIGH (ref 0.1–1.0)
Monocytes Relative: 6 %
Neutro Abs: 16.1 10*3/uL — ABNORMAL HIGH (ref 1.7–7.7)
Neutrophils Relative %: 82 %
Platelet Count: 54 10*3/uL — ABNORMAL LOW (ref 150–400)
RBC: 5.05 MIL/uL (ref 4.22–5.81)
RDW: 19.2 % — ABNORMAL HIGH (ref 11.5–15.5)
WBC Count: 19.4 10*3/uL — ABNORMAL HIGH (ref 4.0–10.5)
nRBC: 1.3 % — ABNORMAL HIGH (ref 0.0–0.2)

## 2021-02-09 LAB — IRON AND TIBC
Iron: 52 ug/dL (ref 42–163)
Saturation Ratios: 20 % (ref 20–55)
TIBC: 265 ug/dL (ref 202–409)
UIBC: 213 ug/dL (ref 117–376)

## 2021-02-09 LAB — FERRITIN: Ferritin: 49 ng/mL (ref 24–336)

## 2021-02-09 NOTE — Patient Instructions (Signed)

## 2021-02-09 NOTE — Progress Notes (Addendum)
Contacted Dr. Burr Medico via [place] regarding pt's H/H (14.5/49.3) and plts 54 today.  Pt's phlebotomy goal is <45% per Dr. Ernestina Penna notes but the pt's plts today are 69 which are a concern for performing a phlebotomy today.  Awaiting Dr. Ernestina Penna response.  '@1125'$  - Dr. Burr Medico, responded to Secure Chat and stated "OK To Treat with low Plts".  '@1130'$  - Removed 534m of blood.  Pt tolerated the procedure well.  Applied extra pressure to procedure site d/t low plts.  Pt ate a sandwich and drunk 2 cups of soda.

## 2021-02-18 ENCOUNTER — Telehealth: Payer: Self-pay

## 2021-02-18 NOTE — Telephone Encounter (Signed)
Called and talked to patient and told him that, per Cira Rue, he is to reduce his hydrea from 5 days/week to 4 days/week. Reminded him of his follow-up with Dr Burr Medico on 8/26. Patient verbalized understanding.

## 2021-03-11 ENCOUNTER — Other Ambulatory Visit: Payer: Medicare Other

## 2021-03-11 ENCOUNTER — Ambulatory Visit: Payer: Medicare Other | Admitting: Hematology

## 2021-03-17 DIAGNOSIS — Z125 Encounter for screening for malignant neoplasm of prostate: Secondary | ICD-10-CM | POA: Diagnosis not present

## 2021-03-17 DIAGNOSIS — E785 Hyperlipidemia, unspecified: Secondary | ICD-10-CM | POA: Diagnosis not present

## 2021-03-17 DIAGNOSIS — R7302 Impaired glucose tolerance (oral): Secondary | ICD-10-CM | POA: Diagnosis not present

## 2021-03-17 DIAGNOSIS — M109 Gout, unspecified: Secondary | ICD-10-CM | POA: Diagnosis not present

## 2021-03-22 DIAGNOSIS — R82998 Other abnormal findings in urine: Secondary | ICD-10-CM | POA: Diagnosis not present

## 2021-03-22 DIAGNOSIS — I10 Essential (primary) hypertension: Secondary | ICD-10-CM | POA: Diagnosis not present

## 2021-03-23 DIAGNOSIS — K4090 Unilateral inguinal hernia, without obstruction or gangrene, not specified as recurrent: Secondary | ICD-10-CM | POA: Diagnosis not present

## 2021-03-23 DIAGNOSIS — M109 Gout, unspecified: Secondary | ICD-10-CM | POA: Diagnosis not present

## 2021-03-23 DIAGNOSIS — E785 Hyperlipidemia, unspecified: Secondary | ICD-10-CM | POA: Diagnosis not present

## 2021-03-23 DIAGNOSIS — M1990 Unspecified osteoarthritis, unspecified site: Secondary | ICD-10-CM | POA: Diagnosis not present

## 2021-03-23 DIAGNOSIS — I350 Nonrheumatic aortic (valve) stenosis: Secondary | ICD-10-CM | POA: Diagnosis not present

## 2021-03-23 DIAGNOSIS — D45 Polycythemia vera: Secondary | ICD-10-CM | POA: Diagnosis not present

## 2021-03-23 DIAGNOSIS — I739 Peripheral vascular disease, unspecified: Secondary | ICD-10-CM | POA: Diagnosis not present

## 2021-03-23 DIAGNOSIS — I720 Aneurysm of carotid artery: Secondary | ICD-10-CM | POA: Diagnosis not present

## 2021-03-23 DIAGNOSIS — Z0000 Encounter for general adult medical examination without abnormal findings: Secondary | ICD-10-CM | POA: Diagnosis not present

## 2021-03-23 DIAGNOSIS — I10 Essential (primary) hypertension: Secondary | ICD-10-CM | POA: Diagnosis not present

## 2021-03-23 DIAGNOSIS — I87332 Chronic venous hypertension (idiopathic) with ulcer and inflammation of left lower extremity: Secondary | ICD-10-CM | POA: Diagnosis not present

## 2021-03-24 ENCOUNTER — Telehealth: Payer: Self-pay

## 2021-03-24 ENCOUNTER — Inpatient Hospital Stay: Payer: Medicare Other

## 2021-03-24 ENCOUNTER — Inpatient Hospital Stay: Payer: Medicare Other | Admitting: Hematology

## 2021-03-24 NOTE — Telephone Encounter (Signed)
This nurse reached ou to patient about missed appointments for today.  Patient states that he called last week and requested to cancel these appointments and reschedule.  Patient states that he is now unable to be away from home past 1pm and would like his appointments scheduled in the morning.  This nurse advised that someone from scheduling would reach out to him.  No further questions or concerns at this time.

## 2021-03-24 NOTE — Progress Notes (Incomplete)
[place]   Telephone:(336) [phone] Fax:(336) [phone]   Clinic Follow up Note   Patient Care Team: [name], MD as PCP - General (Internal Medicine)  Date of Service:  03/24/2021  CHIEF COMPLAINT: f/u of Polycythemia Vera  CURRENT THERAPY:  1. Phlebotomy as needed if HCT>47% 2. Hydrea since 02/2015. Dose has been adjusted as needed, most recently reduced to '500mg'$  daily 5 days a week in early 2022.  3. Xarelto 20 mg started about mid-February 2015 and off after 6 month, restarted again in 02/2015 due to left LE DVT  ASSESSMENT & PLAN: *** [name] [name] is a 87 y.o. male with   ***   ***  No problem-specific Assessment & Plan notes found for this encounter.   INTERVAL HISTORY: *** [name] [name] is here for a follow up of Polycythemia Vera. He was last seen by me on 12/10/20. He presents to the clinic {alone/accompanied by}.   All other systems were reviewed with the patient and are negative.  MEDICAL HISTORY:  Past Medical History:  Diagnosis Date   Bone marrow disease    BPH (benign prostatic hypertrophy)    Depression    DJD (degenerative joint disease)    Dyslipidemia    Gout    HTN (hypertension)    Mild aortic stenosis    Polycythemia vera(238.4)     SURGICAL HISTORY: Past Surgical History:  Procedure Laterality Date   ENDOVENOUS ABLATION SAPHENOUS VEIN W/ LASER Left 09/04/2016   endovenous laser ablation left greater saphenous vein by Tinnie Gens MD   HEMORROIDECTOMY      I have reviewed the social history and family history with the patient and they are unchanged from previous note.  ALLERGIES:  is allergic to amoxicillin and colchicine.  MEDICATIONS:  Current Outpatient Medications  Medication Sig Dispense Refill   acetaminophen (TYLENOL) 500 MG tablet Take 650 mg by mouth every 6 (six) hours as needed.      allopurinol (ZYLOPRIM) 100 MG tablet Take 100 mg by mouth 2 (two) times daily.      cholecalciferol (VITAMIN D3)  25 MCG (1000 UNIT) tablet Take 1,000 Units by mouth daily.     escitalopram (LEXAPRO) 10 MG tablet Take 5 mg by mouth at bedtime.      glucosamine-chondroitin 500-400 MG tablet Take 1 tablet by mouth daily.     hydroxyurea (HYDREA) 500 MG capsule 1 tab daily in the morning except 1 tab twice daily on Mondays (Patient taking differently: 500 mg daily. ) 40 capsule 0   lisinopril (PRINIVIL,ZESTRIL) 10 MG tablet Take 10 mg by mouth daily.     Multiple Vitamin (MULTIVITAMIN WITH MINERALS) TABS tablet Take 1 tablet by mouth daily.     omega-3 acid ethyl esters (LOVAZA) 1 g capsule Take 1 g by mouth daily.     rivaroxaban (XARELTO) 20 MG TABS tablet Take 20 mg by mouth daily with supper.     terazosin (HYTRIN) 5 MG capsule Take 5 mg by mouth at bedtime.     vitamin E (VITAMIN E) 400 UNIT capsule Take 400 Units by mouth daily.     No current facility-administered medications for this visit.    PHYSICAL EXAMINATION: ECOG PERFORMANCE STATUS: {CHL ONC ECOG PS:[id number]}  There were no vitals filed for this visit. Wt Readings from Last 3 Encounters:  12/10/20 162 lb 14.4 oz (73.9 kg)  08/19/20 169 lb 3.2 oz (76.7 kg)  06/17/20 168 lb 11.2 oz (76.5 kg)    ***  GENERAL:alert, no distress and comfortable SKIN: skin color, texture, turgor are normal, no rashes or significant lesions EYES: normal, Conjunctiva are pink and non-injected, sclera clear {OROPHARYNX:no exudate, no erythema and lips, buccal mucosa, and tongue normal}  NECK: supple, thyroid normal size, non-tender, without nodularity LYMPH:  no palpable lymphadenopathy in the cervical, axillary {or inguinal} LUNGS: clear to auscultation and percussion with normal breathing effort HEART: regular rate & rhythm and no murmurs and no lower extremity edema ABDOMEN:abdomen soft, non-tender and normal bowel sounds Musculoskeletal:no cyanosis of digits and no clubbing  NEURO: alert & oriented x 3 with fluent speech, no focal motor/sensory  deficits  LABORATORY DATA:  I have reviewed the data as listed CBC Latest Ref Rng & Units 02/09/2021 01/10/2021 12/10/2020  WBC 4.0 - 10.5 K/uL 19.4(H) 18.1(H) 25.3(H)  Hemoglobin 13.0 - 17.0 g/dL 14.9 15.3 15.6  Hematocrit 39.0 - 52.0 % 49.3 49.4 51.2  Platelets 150 - 400 K/uL 54(L) 60(L) 61(L)     CMP Latest Ref Rng & Units 01/10/2021 09/30/2020 06/17/2020  Glucose 70 - 99 mg/dL 152(H) 139(H) 114(H)  BUN 8 - 23 mg/dL 33(H) 30(H) 30(H)  Creatinine 0.61 - 1.24 mg/dL 1.31(H) 1.34(H) 1.25(H)  Sodium 135 - 145 mmol/L 139 140 139  Potassium 3.5 - 5.1 mmol/L 4.3 4.3 4.4  Chloride 98 - 111 mmol/L 106 108 105  CO2 22 - 32 mmol/L '23 24 26  '$ Calcium 8.9 - 10.3 mg/dL 8.7(L) 8.6(L) 9.0  Total Protein 6.5 - 8.1 g/dL 5.7(L) 5.8(L) 5.9(L)  Total Bilirubin 0.3 - 1.2 mg/dL 1.1 1.0 1.0  Alkaline Phos 38 - 126 U/L 78 78 72  AST 15 - 41 U/L '19 21 21  '$ ALT 0 - 44 U/L '11 16 13      '$ RADIOGRAPHIC STUDIES: I have personally reviewed the radiological images as listed and agreed with the findings in the report. No results found.    No orders of the defined types were placed in this encounter.  All questions were answered. The patient knows to call the clinic with any problems, questions or concerns. No barriers to learning was detected. The total time spent in the appointment was {CHL ONC TIME VISIT - WR:[id number].     Aurea Graff 03/24/2021   I, Wilburn Mylar, am acting as scribe for Truitt Merle, MD.   {Add scribe attestation statement}

## 2021-03-31 ENCOUNTER — Inpatient Hospital Stay: Payer: Medicare Other | Attending: Hematology

## 2021-03-31 ENCOUNTER — Other Ambulatory Visit: Payer: Medicare Other

## 2021-03-31 ENCOUNTER — Ambulatory Visit: Payer: Medicare Other | Admitting: Hematology

## 2021-03-31 ENCOUNTER — Other Ambulatory Visit: Payer: Self-pay

## 2021-03-31 ENCOUNTER — Inpatient Hospital Stay: Payer: Medicare Other

## 2021-03-31 ENCOUNTER — Inpatient Hospital Stay (HOSPITAL_BASED_OUTPATIENT_CLINIC_OR_DEPARTMENT_OTHER): Payer: Medicare Other | Admitting: Hematology

## 2021-03-31 DIAGNOSIS — D45 Polycythemia vera: Secondary | ICD-10-CM | POA: Insufficient documentation

## 2021-03-31 LAB — CMP (CANCER CENTER ONLY)
ALT: 12 U/L (ref 0–44)
AST: 19 U/L (ref 15–41)
Albumin: 3.7 g/dL (ref 3.5–5.0)
Alkaline Phosphatase: 79 U/L (ref 38–126)
Anion gap: 12 (ref 5–15)
BUN: 32 mg/dL — ABNORMAL HIGH (ref 8–23)
CO2: 22 mmol/L (ref 22–32)
Calcium: 9 mg/dL (ref 8.9–10.3)
Chloride: 105 mmol/L (ref 98–111)
Creatinine: 1.33 mg/dL — ABNORMAL HIGH (ref 0.61–1.24)
GFR, Estimated: 50 mL/min — ABNORMAL LOW (ref >60)
Glucose, Bld: 197 mg/dL — ABNORMAL HIGH (ref 70–99)
Potassium: 4.1 mmol/L (ref 3.5–5.1)
Sodium: 139 mmol/L (ref 135–145)
Total Bilirubin: 1.2 mg/dL (ref 0.3–1.2)
Total Protein: 5.8 g/dL — ABNORMAL LOW (ref 6.5–8.1)

## 2021-03-31 LAB — CBC WITH DIFFERENTIAL (CANCER CENTER ONLY)
Abs Immature Granulocytes: 0.3 10*3/uL — ABNORMAL HIGH (ref 0.00–0.07)
Basophils Absolute: 0.5 10*3/uL — ABNORMAL HIGH (ref 0.0–0.1)
Basophils Relative: 2 %
Eosinophils Absolute: 0.2 10*3/uL (ref 0.0–0.5)
Eosinophils Relative: 1 %
HCT: 50.5 % (ref 39.0–52.0)
Hemoglobin: 15.3 g/dL (ref 13.0–17.0)
Immature Granulocytes: 1 %
Lymphocytes Relative: 7 %
Lymphs Abs: 1.4 10*3/uL (ref 0.7–4.0)
MCH: 28.8 pg (ref 26.0–34.0)
MCHC: 30.3 g/dL (ref 30.0–36.0)
MCV: 95.1 fL (ref 80.0–100.0)
Monocytes Absolute: 0.8 10*3/uL (ref 0.1–1.0)
Monocytes Relative: 4 %
Neutro Abs: 18.5 10*3/uL — ABNORMAL HIGH (ref 1.7–7.7)
Neutrophils Relative %: 85 %
Platelet Count: 51 10*3/uL — ABNORMAL LOW (ref 150–400)
RBC: 5.31 MIL/uL (ref 4.22–5.81)
RDW: 18.8 % — ABNORMAL HIGH (ref 11.5–15.5)
WBC Count: 21.7 10*3/uL — ABNORMAL HIGH (ref 4.0–10.5)
nRBC: 1.7 % — ABNORMAL HIGH (ref 0.0–0.2)

## 2021-03-31 NOTE — Patient Instructions (Signed)

## 2021-03-31 NOTE — Progress Notes (Signed)
Phlebotomy procedure started at 0843 and ended at 0847 via 16G to L AC. 527g removed. IV needle removed intact. Patient tolerated procedure well. VSS. Chips and coke provided. Pt d/c in stable condition after 30 min observation.

## 2021-03-31 NOTE — Progress Notes (Signed)
[place]   Telephone:(336) [phone] Fax:(336) [phone]   Clinic Follow up Note   Patient Care Team: [name], MD as PCP - General (Internal Medicine)  Date of Service:  03/31/2021  CHIEF COMPLAINT: f/u of Polycythemia Vera  CURRENT THERAPY:  1. Phlebotomy as needed if HCT>47%.  2. Hydrea decreased to 553m daily 4-5 days/week since 02/2015; increased to take hydrea 6 days/week starting 02/08/17 (hold on Mondays). Increase to Hydrea 5084mBID on Mondays and 50058mnce daily for the rest of week starting 12/13/17. Due to thrombocytopenia we decreased him to 500m69mdays/week on 01/28/18, hold Mondays and thursdays. Returned to HydrNordstrommg15mly in Jan 2020. Increased to 500mg 46mon Mondays and continue 500mg d65m for the rest of week starting 07/29/19. Reduce to 500mg da41min early 08/2019 due to recent ulcer. Reduced to 500mg dai33m days a week (early 2022).  3. Xarelto 20 mg started about mid-February 2015 and off after 6 month, restarted again in 02/2015 due to left LE DVT  ASSESSMENT & PLAN:  [name] L DENYS SALING[age]y.o. m47e with   1. Polycythemia vera diagnosed in 1997, JAK2 mutation positive.  -We previously reviewed the nature history of PV, most people do very well, some people would develop myelofibrosis or leukemia late on. The major complication is thrombosis. -He has been on Hydrea 500 mg daily for 4-5 years with dose adjustment as needed. He is currently on Hydrea 500mg days37meek.  -We will continue phlebotomy if hematocrit more than 47% (instead of 45% due to his advanced age). He has needed more frequently while on lower dose Hydrea. He is reluctant to increase dose of Hydrea given increased risk of ulcers again.  -I discussed the option of Interferon injections for better management and encouraged him to consider. I reviewed side effects with him, especially fatigue, flulike symptoms, and depression, he is concerned about potential side effect and  declined for now. He is currently feeling well and is not bothered by coming here monthly for phlebotomy.  -We reduced his Hydrea dose to 500 mg 4 days a week on 02/18/21. Will monitor closely with labs and phlebotomy every 4 weeks. -If platelet counts drops further, will back off Hydrea dose -F/u in 3 months    2. RLE DVT, LLE DVT in 02/2015, likely secondary to #1 -Continue Xarelto indefinitely, due to recurrent DVT. He can use Vit C to help bruising  -He knows to be careful about fall and injury -He had vascular surgery on his left leg on 09/04/16 for poor circulation by Dr. Lawson.   Kellie Simmeringombocytopenia, secondary to hydrea  -Hydrea has been dose adjusted as needed.  -Goal PLT above 70-80K.  -His plt has further decreased. Plt 611k today (03/31/21) -we discuss the possibility of PV progression to myelofibrosis. Due to his advanced age, will hold on bone marrow biopsy now   4. Comorbidities: Hip and knee pain, PAD -He notes he uses Tylenol as needed for joint pain. He knows to avoid NSAIDs. I discussed starting Tumeric supplement to help his joint pain.  -He has H/o Left lower lateral ankle ulcer in 2021 which resolved after 6 months of slow healing      PLAN:  -Proceed with Phlebotomy today  -Continue hydrea 500mg daily56mdays a week.  -Lab every 4 weeks with phlebotomy for Hct >47% -F/u in 3 months    No problem-specific Assessment & Plan notes found for this encounter.  INTERVAL HISTORY:  [name] [name] is here for a follow up of Polycythemia Vera. He was last seen by me on 12/10/20. He was seen in the infusion area. He was brought here by his "angel." She is a retired [employer] scientist who voluntarily cares for him. He notes he is doing well overall. He is not bothered by coming here monthly.   All other systems were reviewed with the patient and are negative.  MEDICAL HISTORY:  Past Medical History:  Diagnosis Date   Bone marrow disease    BPH (benign prostatic hypertrophy)     Depression    DJD (degenerative joint disease)    Dyslipidemia    Gout    HTN (hypertension)    Mild aortic stenosis    Polycythemia vera(238.4)     SURGICAL HISTORY: Past Surgical History:  Procedure Laterality Date   ENDOVENOUS ABLATION SAPHENOUS VEIN W/ LASER Left 09/04/2016   endovenous laser ablation left greater saphenous vein by Tinnie Gens MD   HEMORROIDECTOMY      I have reviewed the social history and family history with the patient and they are unchanged from previous note.  ALLERGIES:  is allergic to amoxicillin and colchicine.  MEDICATIONS:  Current Outpatient Medications  Medication Sig Dispense Refill   acetaminophen (TYLENOL) 500 MG tablet Take 650 mg by mouth every 6 (six) hours as needed.      allopurinol (ZYLOPRIM) 100 MG tablet Take 100 mg by mouth 2 (two) times daily.      cholecalciferol (VITAMIN D3) 25 MCG (1000 UNIT) tablet Take 1,000 Units by mouth daily.     escitalopram (LEXAPRO) 10 MG tablet Take 5 mg by mouth at bedtime.      glucosamine-chondroitin 500-400 MG tablet Take 1 tablet by mouth daily.     hydroxyurea (HYDREA) 500 MG capsule 1 tab daily in the morning except 1 tab twice daily on Mondays (Patient taking differently: 500 mg daily. ) 40 capsule 0   lisinopril (PRINIVIL,ZESTRIL) 10 MG tablet Take 10 mg by mouth daily.     Multiple Vitamin (MULTIVITAMIN WITH MINERALS) TABS tablet Take 1 tablet by mouth daily.     omega-3 acid ethyl esters (LOVAZA) 1 g capsule Take 1 g by mouth daily.     rivaroxaban (XARELTO) 20 MG TABS tablet Take 20 mg by mouth daily with supper.     terazosin (HYTRIN) 5 MG capsule Take 5 mg by mouth at bedtime.     vitamin E (VITAMIN E) 400 UNIT capsule Take 400 Units by mouth daily.     No current facility-administered medications for this visit.    PHYSICAL EXAMINATION: ECOG PERFORMANCE STATUS: 3 - Symptomatic, >50% confined to bed  There were no vitals filed for this visit. Wt Readings from Last 3 Encounters:   12/10/20 162 lb 14.4 oz (73.9 kg)  08/19/20 169 lb 3.2 oz (76.7 kg)  06/17/20 168 lb 11.2 oz (76.5 kg)     GENERAL:alert, no distress and comfortable SKIN: skin color normal, no rashes or significant lesions EYES: normal, Conjunctiva are pink and non-injected, sclera clear  NEURO: alert & oriented x 3 with fluent speech  LABORATORY DATA:  I have reviewed the data as listed CBC Latest Ref Rng & Units 03/31/2021 02/09/2021 01/10/2021  WBC 4.0 - 10.5 K/uL 21.7(H) 19.4(H) 18.1(H)  Hemoglobin 13.0 - 17.0 g/dL 15.3 14.9 15.3  Hematocrit 39.0 - 52.0 % 50.5 49.3 49.4  Platelets 150 - 400 K/uL 51(L) 54(L) 60(L)     CMP Latest Ref  Rng & Units 03/31/2021 01/10/2021 09/30/2020  Glucose 70 - 99 mg/dL 197(H) 152(H) 139(H)  BUN 8 - 23 mg/dL 32(H) 33(H) 30(H)  Creatinine 0.61 - 1.24 mg/dL 1.33(H) 1.31(H) 1.34(H)  Sodium 135 - 145 mmol/L 139 139 140  Potassium 3.5 - 5.1 mmol/L 4.1 4.3 4.3  Chloride 98 - 111 mmol/L 105 106 108  CO2 22 - 32 mmol/L '22 23 24  ' Calcium 8.9 - 10.3 mg/dL 9.0 8.7(L) 8.6(L)  Total Protein 6.5 - 8.1 g/dL 5.8(L) 5.7(L) 5.8(L)  Total Bilirubin 0.3 - 1.2 mg/dL 1.2 1.1 1.0  Alkaline Phos 38 - 126 U/L 79 78 78  AST 15 - 41 U/L '19 19 21  ' ALT 0 - 44 U/L '12 11 16      ' RADIOGRAPHIC STUDIES: I have personally reviewed the radiological images as listed and agreed with the findings in the report. No results found.    No orders of the defined types were placed in this encounter.  All questions were answered. The patient knows to call the clinic with any problems, questions or concerns. No barriers to learning was detected. The total time spent in the appointment was 25 minutes.     Truitt Merle, MD 03/31/2021   I, Wilburn Mylar, am acting as scribe for Truitt Merle, MD.   I have reviewed the above documentation for accuracy and completeness, and I agree with the above.

## 2021-04-04 ENCOUNTER — Other Ambulatory Visit: Payer: Self-pay

## 2021-04-29 ENCOUNTER — Other Ambulatory Visit: Payer: Medicare Other

## 2021-05-06 ENCOUNTER — Other Ambulatory Visit: Payer: Self-pay

## 2021-05-06 ENCOUNTER — Inpatient Hospital Stay: Payer: Medicare Other

## 2021-05-06 ENCOUNTER — Inpatient Hospital Stay: Payer: Medicare Other | Attending: Hematology

## 2021-05-06 DIAGNOSIS — D45 Polycythemia vera: Secondary | ICD-10-CM | POA: Diagnosis not present

## 2021-05-06 LAB — CBC WITH DIFFERENTIAL (CANCER CENTER ONLY)
Abs Immature Granulocytes: 0.33 10*3/uL — ABNORMAL HIGH (ref 0.00–0.07)
Basophils Absolute: 0.7 10*3/uL — ABNORMAL HIGH (ref 0.0–0.1)
Basophils Relative: 3 %
Eosinophils Absolute: 0.3 10*3/uL (ref 0.0–0.5)
Eosinophils Relative: 1 %
HCT: 48.4 % (ref 39.0–52.0)
Hemoglobin: 14.3 g/dL (ref 13.0–17.0)
Immature Granulocytes: 1 %
Lymphocytes Relative: 5 %
Lymphs Abs: 1.3 10*3/uL (ref 0.7–4.0)
MCH: 28.9 pg (ref 26.0–34.0)
MCHC: 29.5 g/dL — ABNORMAL LOW (ref 30.0–36.0)
MCV: 97.8 fL (ref 80.0–100.0)
Monocytes Absolute: 1.6 10*3/uL — ABNORMAL HIGH (ref 0.1–1.0)
Monocytes Relative: 7 %
Neutro Abs: 20.5 10*3/uL — ABNORMAL HIGH (ref 1.7–7.7)
Neutrophils Relative %: 83 %
Platelet Count: 53 10*3/uL — ABNORMAL LOW (ref 150–400)
RBC: 4.95 MIL/uL (ref 4.22–5.81)
RDW: 19.9 % — ABNORMAL HIGH (ref 11.5–15.5)
WBC Count: 24.7 10*3/uL — ABNORMAL HIGH (ref 4.0–10.5)
nRBC: 1.8 % — ABNORMAL HIGH (ref 0.0–0.2)

## 2021-05-06 NOTE — Patient Instructions (Signed)

## 2021-05-06 NOTE — Progress Notes (Signed)
Phlebotomy procedure via 16G to R AC. 547g removed. IV needle removed intact. Patient tolerated procedure well. VSS. Chips and coke provided. Pt d/c in stable condition after 30 min observation.

## 2021-05-09 ENCOUNTER — Emergency Department (HOSPITAL_BASED_OUTPATIENT_CLINIC_OR_DEPARTMENT_OTHER): Payer: Medicare Other | Admitting: Radiology

## 2021-05-09 ENCOUNTER — Emergency Department (HOSPITAL_BASED_OUTPATIENT_CLINIC_OR_DEPARTMENT_OTHER): Payer: Medicare Other

## 2021-05-09 ENCOUNTER — Other Ambulatory Visit: Payer: Self-pay

## 2021-05-09 ENCOUNTER — Emergency Department (HOSPITAL_BASED_OUTPATIENT_CLINIC_OR_DEPARTMENT_OTHER)
Admission: EM | Admit: 2021-05-09 | Discharge: 2021-05-09 | Disposition: A | Discharge status: Home / Self Care | Payer: Medicare Other | Attending: Student | Admitting: Student

## 2021-05-09 ENCOUNTER — Encounter (HOSPITAL_BASED_OUTPATIENT_CLINIC_OR_DEPARTMENT_OTHER): Payer: Self-pay | Admitting: Emergency Medicine

## 2021-05-09 DIAGNOSIS — Z862 Personal history of diseases of the blood and blood-forming organs and certain disorders involving the immune mechanism: Secondary | ICD-10-CM | POA: Diagnosis not present

## 2021-05-09 DIAGNOSIS — Z7901 Long term (current) use of anticoagulants: Secondary | ICD-10-CM | POA: Insufficient documentation

## 2021-05-09 DIAGNOSIS — E860 Dehydration: Secondary | ICD-10-CM | POA: Diagnosis not present

## 2021-05-09 DIAGNOSIS — Z86718 Personal history of other venous thrombosis and embolism: Secondary | ICD-10-CM | POA: Diagnosis not present

## 2021-05-09 DIAGNOSIS — I959 Hypotension, unspecified: Secondary | ICD-10-CM | POA: Diagnosis not present

## 2021-05-09 DIAGNOSIS — I10 Essential (primary) hypertension: Secondary | ICD-10-CM | POA: Insufficient documentation

## 2021-05-09 DIAGNOSIS — Z79899 Other long term (current) drug therapy: Secondary | ICD-10-CM | POA: Diagnosis not present

## 2021-05-09 DIAGNOSIS — M7981 Nontraumatic hematoma of soft tissue: Secondary | ICD-10-CM | POA: Diagnosis not present

## 2021-05-09 DIAGNOSIS — M1711 Unilateral primary osteoarthritis, right knee: Secondary | ICD-10-CM | POA: Diagnosis not present

## 2021-05-09 DIAGNOSIS — M79604 Pain in right leg: Secondary | ICD-10-CM | POA: Diagnosis not present

## 2021-05-09 DIAGNOSIS — S8011XA Contusion of right lower leg, initial encounter: Secondary | ICD-10-CM | POA: Diagnosis not present

## 2021-05-09 DIAGNOSIS — S8012XA Contusion of left lower leg, initial encounter: Secondary | ICD-10-CM | POA: Diagnosis not present

## 2021-05-09 DIAGNOSIS — T148XXA Other injury of unspecified body region, initial encounter: Secondary | ICD-10-CM

## 2021-05-09 LAB — CBC WITH DIFFERENTIAL/PLATELET
Abs Immature Granulocytes: 0.67 10*3/uL — ABNORMAL HIGH (ref 0.00–0.07)
Abs Immature Granulocytes: 0.46 10*3/uL — ABNORMAL HIGH (ref 0.00–0.07)
Basophils Absolute: 0.6 10*3/uL — ABNORMAL HIGH (ref 0.0–0.1)
Basophils Absolute: 0.5 10*3/uL — ABNORMAL HIGH (ref 0.0–0.1)
Basophils Relative: 2 %
Basophils Relative: 2 %
Eosinophils Absolute: 0.1 10*3/uL (ref 0.0–0.5)
Eosinophils Absolute: 0.1 10*3/uL (ref 0.0–0.5)
Eosinophils Relative: 0 %
Eosinophils Relative: 0 %
HCT: 42.6 % (ref 39.0–52.0)
HCT: 37.8 % — ABNORMAL LOW (ref 39.0–52.0)
Hemoglobin: 12.7 g/dL — ABNORMAL LOW (ref 13.0–17.0)
Hemoglobin: 11.2 g/dL — ABNORMAL LOW (ref 13.0–17.0)
Immature Granulocytes: 2 %
Immature Granulocytes: 2 %
Lymphocytes Relative: 5 %
Lymphocytes Relative: 5 %
Lymphs Abs: 1.6 10*3/uL (ref 0.7–4.0)
Lymphs Abs: 1.2 10*3/uL (ref 0.7–4.0)
MCH: 28.8 pg (ref 26.0–34.0)
MCH: 28.8 pg (ref 26.0–34.0)
MCHC: 29.8 g/dL — ABNORMAL LOW (ref 30.0–36.0)
MCHC: 29.6 g/dL — ABNORMAL LOW (ref 30.0–36.0)
MCV: 96.6 fL (ref 80.0–100.0)
MCV: 97.2 fL (ref 80.0–100.0)
Monocytes Absolute: 2 10*3/uL — ABNORMAL HIGH (ref 0.1–1.0)
Monocytes Absolute: 1.3 10*3/uL — ABNORMAL HIGH (ref 0.1–1.0)
Monocytes Relative: 6 %
Monocytes Relative: 5 %
Neutro Abs: 27.6 10*3/uL — ABNORMAL HIGH (ref 1.7–7.7)
Neutro Abs: 22.5 10*3/uL — ABNORMAL HIGH (ref 1.7–7.7)
Neutrophils Relative %: 85 %
Neutrophils Relative %: 86 %
Platelets: 59 10*3/uL — ABNORMAL LOW (ref 150–400)
Platelets: 49 10*3/uL — ABNORMAL LOW (ref 150–400)
RBC: 4.41 MIL/uL (ref 4.22–5.81)
RBC: 3.89 MIL/uL — ABNORMAL LOW (ref 4.22–5.81)
RDW: 20.3 % — ABNORMAL HIGH (ref 11.5–15.5)
RDW: 19.9 % — ABNORMAL HIGH (ref 11.5–15.5)
Smear Review: NORMAL
WBC: 32.5 10*3/uL — ABNORMAL HIGH (ref 4.0–10.5)
WBC: 26.1 10*3/uL — ABNORMAL HIGH (ref 4.0–10.5)
nRBC: 1.7 % — ABNORMAL HIGH (ref 0.0–0.2)
nRBC: 1.6 % — ABNORMAL HIGH (ref 0.0–0.2)

## 2021-05-09 LAB — COMPREHENSIVE METABOLIC PANEL
ALT: 11 U/L (ref 0–44)
AST: 19 U/L (ref 15–41)
Albumin: 3.8 g/dL (ref 3.5–5.0)
Alkaline Phosphatase: 61 U/L (ref 38–126)
Anion gap: 7 (ref 5–15)
BUN: 37 mg/dL — ABNORMAL HIGH (ref 8–23)
CO2: 25 mmol/L (ref 22–32)
Calcium: 8.8 mg/dL — ABNORMAL LOW (ref 8.9–10.3)
Chloride: 104 mmol/L (ref 98–111)
Creatinine, Ser: 1.3 mg/dL — ABNORMAL HIGH (ref 0.61–1.24)
GFR, Estimated: 51 mL/min — ABNORMAL LOW (ref >60)
Glucose, Bld: 131 mg/dL — ABNORMAL HIGH (ref 70–99)
Potassium: 4.6 mmol/L (ref 3.5–5.1)
Sodium: 136 mmol/L (ref 135–145)
Total Bilirubin: 1.3 mg/dL — ABNORMAL HIGH (ref 0.3–1.2)
Total Protein: 5.5 g/dL — ABNORMAL LOW (ref 6.5–8.1)

## 2021-05-09 LAB — APTT: aPTT: 38 seconds — ABNORMAL HIGH (ref 24–36)

## 2021-05-09 LAB — PROTIME-INR
INR: 1.8 — ABNORMAL HIGH (ref 0.8–1.2)
Prothrombin Time: 20.8 seconds — ABNORMAL HIGH (ref 11.4–15.2)

## 2021-05-09 MED ORDER — SODIUM CHLORIDE 0.9 % IV BOLUS
1000.0000 mL | Freq: Once | INTRAVENOUS | Status: AC
  Administered 2021-05-09: 1000 mL via INTRAVENOUS

## 2021-05-09 NOTE — ED Notes (Signed)
Delay with discharge due to waiting for repeat labs.

## 2021-05-09 NOTE — Discharge Instructions (Addendum)
You were seen in the emergency department for evaluation of dizziness and a right leg bruise.  Your ultrasound and x-ray of the knee were reassuringly normal and there is no evidence of clot or hematoma formation in the knee.  Your white blood cell count was elevated today, but the rest of your blood work does not show any additional evidence of infection and will be important for you to follow-up with your primary care physician.  Please return the emergency department if you develop a fever, worsening fatigue, numbness and tingling of the leg, vomiting or any other concerning symptoms.

## 2021-05-09 NOTE — ED Notes (Signed)
Pt has not taken BP medication yet today.

## 2021-05-09 NOTE — ED Triage Notes (Addendum)
Pt awoke this morning with posterior right knee/lower hamstring pain and noticed a large new bruise on the back of his leg. Pt is on blood thinners.  Denies any known injury.  Pt also reports given 500+grams of blood on Friday for polycythemia vera. And states he felt "a little weak" this morning.

## 2021-05-09 NOTE — ED Notes (Signed)
[place] in process at present time.

## 2021-05-09 NOTE — ED Provider Notes (Signed)
[facility]ERGENC[facility] DEPT Provider Note   CSN: [id number] Arrival date & time: 05/09/21  1233     History Chief Complaint  Patient presents with   Leg Pain    [name] [name] is a 87 y.o. male with PMH polycythemia vera, aortic stenosis, HTN, previous DVTs currently on Eliquis who presents the emergency department for evaluation of right leg pain.  Patient recently had approximately 540 cc of blood phlebotomized on Friday and awoke this morning with generalized fatigue and weakness.  He also endorses a new bruise noticed to the popliteal area on the right.  Denies trauma to the leg.  Denies fever, chest pain, shortness of breath, Donnell pain for nausea, vomiting or other systemic symptoms.  Patient has full range of motion of the leg and no significant swelling.   Leg Pain Associated symptoms: no back pain and no fever       Past Medical History:  Diagnosis Date   Bone marrow disease    BPH (benign prostatic hypertrophy)    Depression    DJD (degenerative joint disease)    Dyslipidemia    Gout    HTN (hypertension)    Mild aortic stenosis    Polycythemia vera(238.4)     Patient Active Problem List   Diagnosis Date Noted   Unilateral primary osteoarthritis, left hip 01/08/2017   Pain in left hip 01/08/2017   Varicose veins of bilateral lower extremities with other complications 95/63/8756   Thrombocytopenia ([place]) 11/18/2015   DVT (deep venous thrombosis) ([place]) 09/26/2013   Aneurysm of unspecified site ([place]) 02/26/2012   Polycythemia vera ([place]) 05/30/2011    Past Surgical History:  Procedure Laterality Date   ENDOVENOUS ABLATION SAPHENOUS VEIN W/ LASER Left 09/04/2016   endovenous laser ablation left greater saphenous vein by Tinnie Gens MD   HEMORROIDECTOMY         Family History  Problem Relation Age of Onset   Hypertension Mother     Social History   Tobacco Use   Smoking status: Never   Smokeless tobacco: Never  Substance Use Topics    Alcohol use: Yes    Alcohol/week: 7.0 standard drinks    Types: 7 Glasses of wine per week    Comment: red wine   Drug use: No    Home Medications Prior to Admission medications   Medication Sig Start Date End Date Taking? Authorizing Provider  allopurinol (ZYLOPRIM) 100 MG tablet Take 100 mg by mouth 2 (two) times daily.    Yes [provider]  escitalopram (LEXAPRO) 10 MG tablet Take 5 mg by mouth at bedtime.    Yes [provider]  hydroxyurea (HYDREA) 500 MG capsule 1 tab daily in the morning except 1 tab twice daily on Mondays Patient taking differently: 500 mg daily. 12/13/17  Yes Truitt Merle, MD  lisinopril (PRINIVIL,ZESTRIL) 10 MG tablet Take 10 mg by mouth daily.   Yes [provider]  rivaroxaban (XARELTO) 20 MG TABS tablet Take 20 mg by mouth daily with supper.   Yes [provider]  terazosin (HYTRIN) 5 MG capsule Take 5 mg by mouth at bedtime.   Yes [provider]  acetaminophen (TYLENOL) 500 MG tablet Take 650 mg by mouth every 6 (six) hours as needed.     [provider]  cholecalciferol (VITAMIN D3) 25 MCG (1000 UNIT) tablet Take 1,000 Units by mouth daily.    [provider]  glucosamine-chondroitin 500-400 MG tablet Take 1 tablet by mouth daily.  [provider]  Multiple Vitamin (MULTIVITAMIN WITH MINERALS) TABS tablet Take 1 tablet by mouth daily.    [provider]  omega-3 acid ethyl esters (LOVAZA) 1 g capsule Take 1 g by mouth daily.    [provider]  vitamin E (VITAMIN E) 400 UNIT capsule Take 400 Units by mouth daily.    [provider]    Allergies    Amoxicillin and Colchicine  Review of Systems   Review of Systems  Constitutional:  Negative for chills and fever.  HENT:  Negative for ear pain and sore throat.   Eyes:  Negative for pain and visual disturbance.  Respiratory:  Negative for cough and shortness of breath.   Cardiovascular:  Negative for  chest pain and palpitations.  Gastrointestinal:  Negative for abdominal pain and vomiting.  Genitourinary:  Negative for dysuria and hematuria.  Musculoskeletal:  Positive for arthralgias. Negative for back pain.  Skin:  Negative for color change and rash.       Hematoma  Neurological:  Negative for seizures and syncope.  All other systems reviewed and are negative.  Physical Exam Updated Vital Signs BP 102/73   Pulse 80   Temp 98.5 F (36.9 C) (Oral)   Resp (!) 22   SpO2 93%   Physical Exam Vitals and nursing note reviewed.  Constitutional:      Appearance: He is well-developed.  HENT:     Head: Normocephalic and atraumatic.  Eyes:     Conjunctiva/sclera: Conjunctivae normal.  Cardiovascular:     Rate and Rhythm: Normal rate and regular rhythm.     Heart sounds: Murmur heard.  Pulmonary:     Effort: Pulmonary effort is normal. No respiratory distress.     Breath sounds: Normal breath sounds.  Abdominal:     Palpations: Abdomen is soft.     Tenderness: There is no abdominal tenderness.  Musculoskeletal:     Cervical back: Neck supple.  Skin:    General: Skin is warm and dry.     Findings: Rash (Bilateral venous stasis dermatitis to anterior shins and feet) present.     Comments: Large area of bruising extending over the joint line on the right knee but no significant swelling  Neurological:     Mental Status: He is alert.    ED Results / Procedures / Treatments   Labs (all labs ordered are listed, but only abnormal results are displayed) Labs Reviewed  PROTIME-INR - Abnormal; Notable for the following components:      Result Value   Prothrombin Time 20.8 (*)    INR 1.8 (*)    All other components within normal limits  APTT - Abnormal; Notable for the following components:   aPTT 38 (*)    All other components within normal limits  COMPREHENSIVE METABOLIC PANEL  CBC WITH DIFFERENTIAL/PLATELET    EKG None  Radiology No results  found.  Procedures .Critical Care Performed by: Teressa Lower, MD Authorized by: Teressa Lower, MD   Critical care provider statement:    Critical care time (minutes):  20   Critical care was necessary to treat or prevent imminent or life-threatening deterioration of the following conditions:  Circulatory failure   Critical care was time spent personally by me on the following activities:  Blood draw for specimens, ordering and performing treatments and interventions, ordering and review of laboratory studies, ordering and review of radiographic studies, pulse oximetry, review of old charts and examination of patient   Medications Ordered in  ED Medications  sodium chloride 0.9 % bolus 1,000 mL (1,000 mLs Intravenous New Bag/Given 05/09/21 1339)    ED Course  I have reviewed the triage vital signs and the nursing notes.  Pertinent labs & imaging results that were available during my care of the patient were reviewed by me and considered in my medical decision making (see chart for details).    MDM Rules/Calculators/A&P                           Patient seen the emergency department for evaluation of right leg pain, new bruising and hypotension.  Patient arrives with systolics in the 53M but is alert and oriented answering questions appropriately.  Physical exam reveals a large area of new bruising that crosses the joint line to the posterior aspect of the right knee as well as a known systolic murmur consistent with his history of aortic stenosis.  Patient received 1 L of fluid and his blood pressure normalized.  Laboratory evaluation with a leukocytosis to 32.5 which is elevated from a chronic leukocytosis last seen at 24.7, thrombocytopenia to 59, chemistry with a BUN of 37 and creatinine 1.3 which is the patient's baseline.  INR elevated to 1.8 likely falsely elevated in setting of his Eliquis use.  X-ray of the knee with no evidence of gas formation, no fractures, tricompartmental  osteoarthritis.  Ultrasound of the lower extremity with no appreciable hematoma formation or DVT.  On reevaluation, the patient is hemodynamically stable, alert and requesting discharge.  The patient's elevated white blood cell count is concerning, but he has a known history of persistent leukocytosis and he has no hemodynamic instability more than an hour after fluid resuscitation, no fevers and no persistent pain in the leg concerning for necrotizing fasciitis.  He will follow-up with his oncologist and primary care physician.  Patient then discharged after voicing understanding of strict return precautions. Final Clinical Impression(s) / ED Diagnoses Final diagnoses:  None    Rx / DC Orders ED Discharge Orders     None        Julieanna Geraci, Debe Coder, MD 05/09/21 [phone]

## 2021-05-09 NOTE — ED Notes (Signed)
Labs drawn and sent to hold for orders

## 2021-05-18 DIAGNOSIS — I739 Peripheral vascular disease, unspecified: Secondary | ICD-10-CM | POA: Diagnosis not present

## 2021-05-18 DIAGNOSIS — I10 Essential (primary) hypertension: Secondary | ICD-10-CM | POA: Diagnosis not present

## 2021-05-18 DIAGNOSIS — M7989 Other specified soft tissue disorders: Secondary | ICD-10-CM | POA: Diagnosis not present

## 2021-05-18 DIAGNOSIS — I82503 Chronic embolism and thrombosis of unspecified deep veins of lower extremity, bilateral: Secondary | ICD-10-CM | POA: Diagnosis not present

## 2021-05-18 DIAGNOSIS — D45 Polycythemia vera: Secondary | ICD-10-CM | POA: Diagnosis not present

## 2021-05-30 ENCOUNTER — Other Ambulatory Visit: Payer: Medicare Other

## 2021-05-30 ENCOUNTER — Encounter: Payer: Self-pay | Admitting: Hematology

## 2021-05-30 ENCOUNTER — Ambulatory Visit: Payer: Medicare Other

## 2021-05-30 ENCOUNTER — Inpatient Hospital Stay: Payer: Medicare Other

## 2021-05-30 ENCOUNTER — Other Ambulatory Visit: Payer: Self-pay

## 2021-05-30 ENCOUNTER — Inpatient Hospital Stay: Payer: Medicare Other | Attending: Hematology | Admitting: Hematology

## 2021-05-30 VITALS — BP 119/71 | HR 70 | Temp 97.9°F | Resp 18 | Ht 71.0 in | Wt 161.6 lb

## 2021-05-30 DIAGNOSIS — Z86718 Personal history of other venous thrombosis and embolism: Secondary | ICD-10-CM | POA: Diagnosis not present

## 2021-05-30 DIAGNOSIS — D45 Polycythemia vera: Secondary | ICD-10-CM

## 2021-05-30 DIAGNOSIS — D6959 Other secondary thrombocytopenia: Secondary | ICD-10-CM | POA: Insufficient documentation

## 2021-05-30 DIAGNOSIS — Z7901 Long term (current) use of anticoagulants: Secondary | ICD-10-CM | POA: Diagnosis not present

## 2021-05-30 LAB — CBC WITH DIFFERENTIAL (CANCER CENTER ONLY)
Abs Immature Granulocytes: 0.32 10*3/uL — ABNORMAL HIGH (ref 0.00–0.07)
Basophils Absolute: 0.5 10*3/uL — ABNORMAL HIGH (ref 0.0–0.1)
Basophils Relative: 2 %
Eosinophils Absolute: 0.2 10*3/uL (ref 0.0–0.5)
Eosinophils Relative: 1 %
HCT: 42.9 % (ref 39.0–52.0)
Hemoglobin: 12.8 g/dL — ABNORMAL LOW (ref 13.0–17.0)
Immature Granulocytes: 1 %
Lymphocytes Relative: 4 %
Lymphs Abs: 0.9 10*3/uL (ref 0.7–4.0)
MCH: 29 pg (ref 26.0–34.0)
MCHC: 29.8 g/dL — ABNORMAL LOW (ref 30.0–36.0)
MCV: 97.1 fL (ref 80.0–100.0)
Monocytes Absolute: 1.5 10*3/uL — ABNORMAL HIGH (ref 0.1–1.0)
Monocytes Relative: 7 %
Neutro Abs: 19.2 10*3/uL — ABNORMAL HIGH (ref 1.7–7.7)
Neutrophils Relative %: 85 %
Platelet Count: 46 10*3/uL — ABNORMAL LOW (ref 150–400)
RBC: 4.42 MIL/uL (ref 4.22–5.81)
RDW: 20.3 % — ABNORMAL HIGH (ref 11.5–15.5)
WBC Count: 22.6 10*3/uL — ABNORMAL HIGH (ref 4.0–10.5)
nRBC: 2 % — ABNORMAL HIGH (ref 0.0–0.2)

## 2021-05-30 MED ORDER — RIVAROXABAN 10 MG PO TABS: 10.0000 mg | ORAL_TABLET | Freq: Every day | ORAL | 1 refills | Status: DC

## 2021-05-30 NOTE — Progress Notes (Signed)
[facility]   Telephone:([phone] [phone]   Clinic Follow up Note   Patient Care Team: [name], MD as PCP - General (Internal Medicine)  Date of Service:  05/30/2021  CHIEF COMPLAINT: f/u of Polycythemia Vera  CURRENT THERAPY:  1. Phlebotomy as needed if HCT>47%.  2. Hydrea decreased to 581m daily 4-5 days/week since 02/2015; increased to take hydrea 6 days/week starting 02/08/17 (hold on Mondays). Increase to Hydrea 5063mBID on Mondays and 50052mnce daily for the rest of week starting 12/13/17. Due to thrombocytopenia we decreased him to 500m23mdays/week on 01/28/18, hold Mondays and thursdays. Returned to HydrNordstrommg46mly in Jan 2020. Increased to 500mg 29mon Mondays and continue 500mg d73m for the rest of week starting 07/29/19. Reduce to 500mg da5min early 08/2019 due to recent ulcer. Reduced to 500mg dai90m days a week (early 2022).  3. Xarelto 20 mg started about mid-February 2015 and off after 6 month, restarted again in 02/2015 due to left LE DVT  ASSESSMENT & PLAN:  [name] L KAR[age]ER[age]y.o. m32e with   1. Polycythemia vera diagnosed in 1997, JAK2 mutation positive.  -We previously reviewed the nature history of PV, most people do very well, some people would develop myelofibrosis or leukemia late on. The major complication is thrombosis. -He has been on Hydrea 500 mg daily for 4-5 years with dose adjustment as needed.  -We will continue phlebotomy if hematocrit more than 47% (instead of 45% due to his advanced age).  -labs reviewed, hg 12.8, plt 46k, HCT 42.9%. No need for phlebotomy today. -due to his falling platelet counts, we will reduce his Hydrea dose further to 3 days a week (MWF) -labs monthly, f/u in 3 months    2. RLE DVT, LLE DVT in 02/2015, likely secondary to #1 -Continue Xarelto indefinitely, due to recurrent DVT. He can use Vit C to help bruising.  -He knows to be careful about fall and injury -He had vascular  surgery on his left leg on 09/04/16 for poor circulation by Dr. Lawson.  Kellie Simmeringrombocytopenia, secondary to hydrea  -Hydrea has been dose adjusted as needed.  -Goal PLT above 70-80K.  -His plt has further decreased. Plt 46k today (05/30/21) -we discuss the possibility of PV progression to myelofibrosis. Due to his advanced age, will hold on bone marrow biopsy now -because of his low plt, we will decrease his Xarelto dose and his Hydrea dose.   4. Comorbidities: Hip and knee pain, PAD -He notes he uses Tylenol as needed for joint pain. He knows to avoid NSAIDs. I discussed starting Tumeric supplement to help his joint pain.  -He has H/o Left lower lateral ankle ulcer in 2021 which resolved after 6 months of slow healing      PLAN:  -Continue hydrea, reduce to 500mg dail32m days a week (MWF).  -reduce Xarelto from 20mg to 1038maily 29mto thrombocytopenia  -Lab every 4 weeks with phlebotomy for Hct >47% -F/u in 3 months   -cancel scheduled office visit on 06/30/21   No problem-specific Assessment & Plan notes found for this encounter.   INTERVAL HISTORY:  [name] [name] a follow up of Polycythemia Vera. He was last seen by me on 03/31/21. He presents to the clinic alone. He notes he is feeling very well. He notes he recently became dehydrated, which he is unsure how it happened because he is eating well. He notes  he has low BP; his wife has a home care nurse who will also take his BP when his wife's is taken.   All other systems were reviewed with the patient and are negative.  MEDICAL HISTORY:  Past Medical History:  Diagnosis Date   Bone marrow disease    BPH (benign prostatic hypertrophy)    Depression    DJD (degenerative joint disease)    Dyslipidemia    Gout    HTN (hypertension)    Mild aortic stenosis    Polycythemia vera(238.4)     SURGICAL HISTORY: Past Surgical History:  Procedure Laterality Date   ENDOVENOUS ABLATION SAPHENOUS VEIN W/ LASER Left  09/04/2016   endovenous laser ablation left greater saphenous vein by Tinnie Gens MD   HEMORROIDECTOMY      I have reviewed the social history and family history with the patient and they are unchanged from previous note.  ALLERGIES:  is allergic to amoxicillin and colchicine.  MEDICATIONS:  Current Outpatient Medications  Medication Sig Dispense Refill   rivaroxaban (XARELTO) 10 MG TABS tablet Take 1 tablet (10 mg total) by mouth daily. 90 tablet 1   acetaminophen (TYLENOL) 500 MG tablet Take 650 mg by mouth every 6 (six) hours as needed.      allopurinol (ZYLOPRIM) 100 MG tablet Take 100 mg by mouth 2 (two) times daily.      cholecalciferol (VITAMIN D3) 25 MCG (1000 UNIT) tablet Take 1,000 Units by mouth daily.     escitalopram (LEXAPRO) 10 MG tablet Take 5 mg by mouth at bedtime.      glucosamine-chondroitin 500-400 MG tablet Take 1 tablet by mouth daily.     hydroxyurea (HYDREA) 500 MG capsule 1 tab daily in the morning except 1 tab twice daily on Mondays (Patient taking differently: 500 mg daily.) 40 capsule 0   lisinopril (PRINIVIL,ZESTRIL) 10 MG tablet Take 10 mg by mouth daily.     Multiple Vitamin (MULTIVITAMIN WITH MINERALS) TABS tablet Take 1 tablet by mouth daily.     omega-3 acid ethyl esters (LOVAZA) 1 g capsule Take 1 g by mouth daily.     terazosin (HYTRIN) 5 MG capsule Take 5 mg by mouth at bedtime.     vitamin E (VITAMIN E) 400 UNIT capsule Take 400 Units by mouth daily.     No current facility-administered medications for this visit.    PHYSICAL EXAMINATION: ECOG PERFORMANCE STATUS: 2 - Symptomatic, <50% confined to bed  Vitals:   05/30/21 0940  BP: 119/71  Pulse: 70  Resp: 18  Temp: 97.9 F (36.6 C)  SpO2: 97%   Wt Readings from Last 3 Encounters:  05/30/21 161 lb 9.6 oz (73.3 kg)  05/06/21 104 lb (47.2 kg)  12/10/20 162 lb 14.4 oz (73.9 kg)     GENERAL:alert, no distress and comfortable SKIN: skin color normal, no rashes or significant  lesions EYES: normal, Conjunctiva are pink and non-injected, sclera clear  NEURO: alert & oriented x 3 with fluent speech  LABORATORY DATA:  I have reviewed the data as listed CBC Latest Ref Rng & Units 05/30/2021 05/09/2021 05/09/2021  WBC 4.0 - 10.5 K/uL 22.6(H) 26.1(H) 32.5(H)  Hemoglobin 13.0 - 17.0 g/dL 12.8(L) 11.2(L) 12.7(L)  Hematocrit 39.0 - 52.0 % 42.9 37.8(L) 42.6  Platelets 150 - 400 K/uL 46(L) 49(L) 59(L)     CMP Latest Ref Rng & Units 05/09/2021 03/31/2021 01/10/2021  Glucose 70 - 99 mg/dL 131(H) 197(H) 152(H)  BUN 8 - 23 mg/dL 37(H) 32(H) 33(H)  Creatinine 0.61 - 1.24 mg/dL 1.30(H) 1.33(H) 1.31(H)  Sodium 135 - 145 mmol/L 136 139 139  Potassium 3.5 - 5.1 mmol/L 4.6 4.1 4.3  Chloride 98 - 111 mmol/L 104 105 106  CO2 22 - 32 mmol/L '25 22 23  ' Calcium 8.9 - 10.3 mg/dL 8.8(L) 9.0 8.7(L)  Total Protein 6.5 - 8.1 g/dL 5.5(L) 5.8(L) 5.7(L)  Total Bilirubin 0.3 - 1.2 mg/dL 1.3(H) 1.2 1.1  Alkaline Phos 38 - 126 U/L 61 79 78  AST 15 - 41 U/L '19 19 19  ' ALT 0 - 44 U/L '11 12 11      ' RADIOGRAPHIC STUDIES: I have personally reviewed the radiological images as listed and agreed with the findings in the report. No results found.    No orders of the defined types were placed in this encounter.  All questions were answered. The patient knows to call the clinic with any problems, questions or concerns. No barriers to learning was detected.      Truitt Merle, MD 05/30/2021   I, Wilburn Mylar, am acting as scribe for Truitt Merle, MD.   I have reviewed the above documentation for accuracy and completeness, and I agree with the above.

## 2021-06-30 ENCOUNTER — Ambulatory Visit: Payer: Medicare Other | Admitting: Hematology

## 2021-06-30 ENCOUNTER — Other Ambulatory Visit: Payer: Medicare Other

## 2021-06-30 ENCOUNTER — Other Ambulatory Visit: Payer: Self-pay

## 2021-06-30 ENCOUNTER — Inpatient Hospital Stay: Payer: Medicare Other

## 2021-06-30 ENCOUNTER — Inpatient Hospital Stay: Payer: Medicare Other | Attending: Hematology

## 2021-06-30 VITALS — BP 106/66 | HR 75 | Temp 98.0°F | Resp 16

## 2021-06-30 DIAGNOSIS — D45 Polycythemia vera: Secondary | ICD-10-CM

## 2021-06-30 DIAGNOSIS — Z23 Encounter for immunization: Secondary | ICD-10-CM

## 2021-06-30 LAB — CMP (CANCER CENTER ONLY)
ALT: 10 U/L (ref 0–44)
AST: 18 U/L (ref 15–41)
Albumin: 3.4 g/dL — ABNORMAL LOW (ref 3.5–5.0)
Alkaline Phosphatase: 74 U/L (ref 38–126)
Anion gap: 9 (ref 5–15)
BUN: 34 mg/dL — ABNORMAL HIGH (ref 8–23)
CO2: 24 mmol/L (ref 22–32)
Calcium: 8.4 mg/dL — ABNORMAL LOW (ref 8.9–10.3)
Chloride: 108 mmol/L (ref 98–111)
Creatinine: 1.4 mg/dL — ABNORMAL HIGH (ref 0.61–1.24)
GFR, Estimated: 46 mL/min — ABNORMAL LOW (ref >60)
Glucose, Bld: 130 mg/dL — ABNORMAL HIGH (ref 70–99)
Potassium: 4.3 mmol/L (ref 3.5–5.1)
Sodium: 141 mmol/L (ref 135–145)
Total Bilirubin: 1.2 mg/dL (ref 0.3–1.2)
Total Protein: 5.6 g/dL — ABNORMAL LOW (ref 6.5–8.1)

## 2021-06-30 LAB — CBC WITH DIFFERENTIAL (CANCER CENTER ONLY)
Abs Immature Granulocytes: 0.33 10*3/uL — ABNORMAL HIGH (ref 0.00–0.07)
Basophils Absolute: 0.6 10*3/uL — ABNORMAL HIGH (ref 0.0–0.1)
Basophils Relative: 3 %
Eosinophils Absolute: 0.2 10*3/uL (ref 0.0–0.5)
Eosinophils Relative: 1 %
HCT: 47.8 % (ref 39.0–52.0)
Hemoglobin: 14.1 g/dL (ref 13.0–17.0)
Immature Granulocytes: 1 %
Lymphocytes Relative: 6 %
Lymphs Abs: 1.5 10*3/uL (ref 0.7–4.0)
MCH: 28.1 pg (ref 26.0–34.0)
MCHC: 29.5 g/dL — ABNORMAL LOW (ref 30.0–36.0)
MCV: 95.2 fL (ref 80.0–100.0)
Monocytes Absolute: 1.5 10*3/uL — ABNORMAL HIGH (ref 0.1–1.0)
Monocytes Relative: 6 %
Neutro Abs: 20 10*3/uL — ABNORMAL HIGH (ref 1.7–7.7)
Neutrophils Relative %: 83 %
Platelet Count: 43 10*3/uL — ABNORMAL LOW (ref 150–400)
RBC: 5.02 MIL/uL (ref 4.22–5.81)
RDW: 19 % — ABNORMAL HIGH (ref 11.5–15.5)
WBC Count: 24.3 10*3/uL — ABNORMAL HIGH (ref 4.0–10.5)
nRBC: 3.1 % — ABNORMAL HIGH (ref 0.0–0.2)

## 2021-06-30 MED ORDER — INFLUENZA VAC A&B SA ADJ QUAD 0.5 ML IM PRSY
0.5000 mL | PREFILLED_SYRINGE | Freq: Once | INTRAMUSCULAR | Status: AC
  Administered 2021-06-30: 0.5 mL via INTRAMUSCULAR

## 2021-06-30 NOTE — Patient Instructions (Signed)

## 2021-06-30 NOTE — Progress Notes (Signed)
[name] [name] presents today for phlebotomy per MD orders. Phlebotomy procedure started at 1041 and ended at 1046. 501 grams removed. Patient observed for 30 minutes after procedure without any incident. Patient tolerated procedure well. IV needle removed intact. Snack and beverage provided.

## 2021-07-04 ENCOUNTER — Telehealth: Payer: Self-pay | Admitting: *Deleted

## 2021-07-04 NOTE — Telephone Encounter (Signed)
Per Regan Rakers Burton,NP called pt with message below. Pt confirmed that he is taking hydrea 500 mg 3x weekly and xarelto daily. Advised if there are any concerns to call office. Pt verbalized understanding.

## 2021-07-04 NOTE — Telephone Encounter (Signed)
-----   Message from Alla Feeling, NP sent at 07/04/2021  6:39 AM EST ----- Please let pt know labs are stable. Plt remain low but also stable over last couple months. Please verify he is taking low dose hydrea 500 mg MWF and xarelto 10 mg daily (not 20 mg daily) per Dr. Ernestina Penna last note.  Thanks, Regan Rakers, NP

## 2021-07-28 ENCOUNTER — Inpatient Hospital Stay: Payer: Medicare Other | Attending: Hematology

## 2021-07-28 ENCOUNTER — Inpatient Hospital Stay: Payer: Medicare Other

## 2021-07-28 ENCOUNTER — Other Ambulatory Visit: Payer: Self-pay

## 2021-07-28 DIAGNOSIS — D45 Polycythemia vera: Secondary | ICD-10-CM | POA: Insufficient documentation

## 2021-07-28 NOTE — Progress Notes (Signed)
Per treatment parameters, no phlebotomy today. See CBC results

## 2021-07-29 LAB — CBC WITH DIFFERENTIAL (CANCER CENTER ONLY)
Basophils Absolute: 0.5 10*3/uL — ABNORMAL HIGH (ref 0.0–0.1)
Basophils Relative: 2 %
Blasts: 2 %
Eosinophils Absolute: 0.3 10*3/uL (ref 0.0–0.5)
Eosinophils Relative: 1 %
HCT: 46 % (ref 39.0–52.0)
Hemoglobin: 13.8 g/dL (ref 13.0–17.0)
Immature Granulocytes: 1 %
Lymphocytes Relative: 5 %
Lymphs Abs: 1.3 10*3/uL (ref 0.7–4.0)
MCH: 28 pg (ref 26.0–34.0)
MCHC: 30 g/dL (ref 30.0–36.0)
MCV: 93.3 fL (ref 80.0–100.0)
Monocytes Absolute: 1.5 10*3/uL — ABNORMAL HIGH (ref 0.1–1.0)
Monocytes Relative: 6 %
Neutro Abs: 21.5 10*3/uL — ABNORMAL HIGH (ref 1.7–7.7)
Neutrophils Relative %: 84 %
Platelet Count: 41 10*3/uL — ABNORMAL LOW (ref 150–400)
RBC: 4.93 MIL/uL (ref 4.22–5.81)
RDW: 19.3 % — ABNORMAL HIGH (ref 11.5–15.5)
WBC Count: 25.6 10*3/uL — ABNORMAL HIGH (ref 4.0–10.5)
nRBC: 2.9 % — ABNORMAL HIGH (ref 0.0–0.2)

## 2021-07-29 LAB — PATHOLOGIST SMEAR REVIEW

## 2021-08-01 ENCOUNTER — Telehealth: Payer: Self-pay

## 2021-08-01 NOTE — Telephone Encounter (Signed)
-----   Message from Alla Feeling, NP sent at 07/31/2021  9:27 AM EST ----- Please let him know CBC is stable, continue same dose hydrea and review next appointments.  Thanks, Regan Rakers, NP

## 2021-08-01 NOTE — Telephone Encounter (Signed)
Pt is aware of his February appts. Also per Cira Rue NP, pt was told by this LPN his CBC is stable and to continue taking the same dose of hydrea he has been. Pt verbalizes understanding.

## 2021-09-01 ENCOUNTER — Inpatient Hospital Stay (HOSPITAL_BASED_OUTPATIENT_CLINIC_OR_DEPARTMENT_OTHER): Payer: Medicare Other | Admitting: Hematology

## 2021-09-01 ENCOUNTER — Encounter: Payer: Self-pay | Admitting: Hematology

## 2021-09-01 ENCOUNTER — Other Ambulatory Visit: Payer: Self-pay | Admitting: Hematology

## 2021-09-01 ENCOUNTER — Inpatient Hospital Stay: Payer: Medicare Other

## 2021-09-01 ENCOUNTER — Other Ambulatory Visit: Payer: Self-pay

## 2021-09-01 ENCOUNTER — Inpatient Hospital Stay: Payer: Medicare Other | Attending: Hematology

## 2021-09-01 VITALS — BP 134/75 | HR 84 | Temp 98.4°F | Resp 17

## 2021-09-01 VITALS — BP 119/66 | HR 79 | Temp 98.7°F | Resp 18 | Ht 71.0 in | Wt 157.3 lb

## 2021-09-01 DIAGNOSIS — D45 Polycythemia vera: Secondary | ICD-10-CM | POA: Diagnosis not present

## 2021-09-01 LAB — CBC WITH DIFFERENTIAL/PLATELET
Abs Immature Granulocytes: 0.24 10*3/uL — ABNORMAL HIGH (ref 0.00–0.07)
Basophils Absolute: 0.7 10*3/uL — ABNORMAL HIGH (ref 0.0–0.1)
Basophils Relative: 3 %
Eosinophils Absolute: 0.2 10*3/uL (ref 0.0–0.5)
Eosinophils Relative: 1 %
HCT: 49.3 % (ref 39.0–52.0)
Hemoglobin: 14.6 g/dL (ref 13.0–17.0)
Immature Granulocytes: 1 %
Lymphocytes Relative: 6 %
Lymphs Abs: 1.5 10*3/uL (ref 0.7–4.0)
MCH: 27.4 pg (ref 26.0–34.0)
MCHC: 29.6 g/dL — ABNORMAL LOW (ref 30.0–36.0)
MCV: 92.7 fL (ref 80.0–100.0)
Monocytes Absolute: 1.6 10*3/uL — ABNORMAL HIGH (ref 0.1–1.0)
Monocytes Relative: 7 %
Neutro Abs: 19.9 10*3/uL — ABNORMAL HIGH (ref 1.7–7.7)
Neutrophils Relative %: 82 %
Platelets: 38 10*3/uL — ABNORMAL LOW (ref 150–400)
RBC: 5.32 MIL/uL (ref 4.22–5.81)
RDW: 21.2 % — ABNORMAL HIGH (ref 11.5–15.5)
WBC: 24.2 10*3/uL — ABNORMAL HIGH (ref 4.0–10.5)
nRBC: 3.7 % — ABNORMAL HIGH (ref 0.0–0.2)

## 2021-09-01 MED ORDER — APIXABAN 2.5 MG PO TABS: 2.5000 mg | ORAL_TABLET | Freq: Two times a day (BID) | ORAL | 1 refills | Status: DC

## 2021-09-01 MED ORDER — HYDROXYUREA 500 MG PO CAPS: 500.0000 mg | ORAL_CAPSULE | Freq: Every day | ORAL | 1 refills | Status: DC

## 2021-09-01 MED ORDER — APIXABAN 2.5 MG PO TABS: 2.5000 mg | ORAL_TABLET | Freq: Every day | ORAL | 1 refills | Status: DC

## 2021-09-01 NOTE — Progress Notes (Signed)
[place]   [phone]) [phone] [phone]   Clinic Follow up Note   Patient Care Team: [name], MD as PCP - General (Internal Medicine)  Date of Service:  09/01/2021  CHIEF COMPLAINT: f/u of Polycythemia Vera  CURRENT THERAPY:  1. Phlebotomy as needed if HCT>47%.  2. Hydrea decreased to 587m daily 4-5 days/week since 02/2015; increased to take hydrea 6 days/week starting 02/08/17 (hold on Mondays). Increase to Hydrea 5014mBID on Mondays and 50018mnce daily for the rest of week starting 12/13/17. Due to thrombocytopenia we decreased him to 500m42mdays/week on 01/28/18, hold Mondays and thursdays. Returned to HydrNordstrommg70mly in Jan 2020. Increased to 500mg 50mon Mondays and continue 500mg d93m for the rest of week starting 07/29/19. Reduce to 500mg da48min early 08/2019 due to recent ulcer. Reduced to 500mg dai69m days a week (early 2022), and 3 days a week in late 2022.  3. Eliquis 2.5mg daily2mwitched from Xarelto)   ASSESSMENT & PLAN:  [name] [name].o. ma22 with   1. Polycythemia vera diagnosed in 1997, JAK2 mutation positive.  -We previously reviewed the nature history of PV, most people do very well, some people would develop myelofibrosis or leukemia late on. The major complication is thrombosis. -He has been on Hydrea 500 mg daily for 4-5 years with dose adjustment as needed.  -We will continue phlebotomy if hematocrit more than 47% (instead of 45% due to his advanced age).  -Pathologist smear review on (07/28/21) showed leukocytosis with 2% blasts. -Due to his advanced age, and limited treatment options, will hold on bone marrow biopsy -Lab reviewed, will proceed with phlebotomy today -Continue Hydrea 5 mg daily on Mondays, Wednesdays and Fridays only    2. RLE DVT, LLE DVT in 02/2015, likely secondary to #1 -Continue Xarelto indefinitely, due to recurrent DVT. He can use Vit C to help bruising.  -He knows to be careful about  fall and injury -He had vascular surgery on his left leg on 09/04/16 for poor circulation by Dr. Lawson. -WKellie Simmeringussed switching xeralto to eliquis due to dosing and kidney function. I called in Eliquis today.   3. Thrombocytopenia, secondary to hydrea and MPN   -Hydrea has been dose adjusted as needed.  -Goal PLT above 70-80K.  -His plt has further decreased. Plt 38k today (09/01/21) -we discuss the possibility of PV progression to myelofibrosis. Due to his advanced age, will hold on bone marrow biopsy now -because of his low plt, we will decrease his Eliquis dose and his Hydrea dose.   4. Comorbidities: Hip and knee pain, PAD -He notes he uses Tylenol as needed for joint pain. He knows to avoid NSAIDs. I discussed starting Tumeric supplement to help his joint pain.  -He has H/o Left lower lateral ankle ulcer in 2021 which resolved after 6 months of slow healing      PLAN:  -proceed with phlebotomy today due to  Hct of 49% -Continue hydrea at 500mg daily24mdays a week (MWF).  -switch xarelto to eliquis 2.5mg daily I71mlled in today, will stop if plt<35K  -Lab every 4 weeks with phlebotomy for Hct >47% -I refilled hydrea. -F/u in 3 months      No problem-specific Assessment & Plan notes found for this encounter.    INTERVAL HISTORY:  [name] [name] a follow up of Polycythemia Vera. He was last seen by me on 05/30/21. He presents  to the clinic accompanied by a friend.  He has some bruising on his hands. He cares for his wife who has mental handicaps. As a result of caring for his wife, he has a wound on his arm. They have someone coming in twice a day to help with their care. He tolerated his last phlebotomy well. The skin on his legs is peeling, but not open.    All other systems were reviewed with the patient and are negative.  MEDICAL HISTORY:  Past Medical History:  Diagnosis Date   Bone marrow disease    BPH (benign prostatic hypertrophy)    Depression     DJD (degenerative joint disease)    Dyslipidemia    Gout    HTN (hypertension)    Mild aortic stenosis    Polycythemia vera(238.4)     SURGICAL HISTORY: Past Surgical History:  Procedure Laterality Date   ENDOVENOUS ABLATION SAPHENOUS VEIN W/ LASER Left 09/04/2016   endovenous laser ablation left greater saphenous vein by Tinnie Gens MD   HEMORROIDECTOMY      I have reviewed the social history and family history with the patient and they are unchanged from previous note.  ALLERGIES:  is allergic to amoxicillin and colchicine.  MEDICATIONS:  Current Outpatient Medications  Medication Sig Dispense Refill   apixaban (ELIQUIS) 2.5 MG TABS tablet Take 1 tablet (2.5 mg total) by mouth 2 (two) times daily. 180 tablet 1   acetaminophen (TYLENOL) 500 MG tablet Take 650 mg by mouth every 6 (six) hours as needed.      allopurinol (ZYLOPRIM) 100 MG tablet Take 100 mg by mouth 2 (two) times daily.      cholecalciferol (VITAMIN D3) 25 MCG (1000 UNIT) tablet Take 1,000 Units by mouth daily.     escitalopram (LEXAPRO) 10 MG tablet Take 5 mg by mouth at bedtime.      glucosamine-chondroitin 500-400 MG tablet Take 1 tablet by mouth daily.     hydroxyurea (HYDREA) 500 MG capsule Take 1 capsule (500 mg total) by mouth daily. On Mondays, Wednesdays and Fridays only, 3 times a week 50 capsule 1   lisinopril (PRINIVIL,ZESTRIL) 10 MG tablet Take 10 mg by mouth daily.     Multiple Vitamin (MULTIVITAMIN WITH MINERALS) TABS tablet Take 1 tablet by mouth daily.     omega-3 acid ethyl esters (LOVAZA) 1 g capsule Take 1 g by mouth daily.     terazosin (HYTRIN) 5 MG capsule Take 5 mg by mouth at bedtime.     vitamin E (VITAMIN E) 400 UNIT capsule Take 400 Units by mouth daily.     No current facility-administered medications for this visit.    PHYSICAL EXAMINATION: ECOG PERFORMANCE STATUS: 2 - Symptomatic, <50% confined to bed  Vitals:   09/01/21 1115  BP: 119/66  Pulse: 79  Resp: 18  Temp: 98.7 F  (37.1 C)  SpO2: 96%    Wt Readings from Last 3 Encounters:  09/01/21 157 lb 4.8 oz (71.4 kg)  05/30/21 161 lb 9.6 oz (73.3 kg)  05/06/21 104 lb (47.2 kg)     GENERAL:alert, no distress and comfortable SKIN: skin color normal, no rashes or significant lesions, (+) skin peeling to legs EYES: normal, Conjunctiva are pink and non-injected, sclera clear  NEURO: alert & oriented x 3 with fluent speech  LABORATORY DATA:  I have reviewed the data as listed CBC Latest Ref Rng & Units 09/01/2021 07/28/2021 06/30/2021  WBC 4.0 - 10.5 K/uL 24.2(H) 25.6(H) 24.3(H)  Hemoglobin  13.0 - 17.0 g/dL 14.6 13.8 14.1  Hematocrit 39.0 - 52.0 % 49.3 46.0 47.8  Platelets 150 - 400 K/uL 38(L) 41(L) 43(L)     CMP Latest Ref Rng & Units 06/30/2021 05/09/2021 03/31/2021  Glucose 70 - 99 mg/dL 130(H) 131(H) 197(H)  BUN 8 - 23 mg/dL 34(H) 37(H) 32(H)  Creatinine 0.61 - 1.24 mg/dL 1.40(H) 1.30(H) 1.33(H)  Sodium 135 - 145 mmol/L 141 136 139  Potassium 3.5 - 5.1 mmol/L 4.3 4.6 4.1  Chloride 98 - 111 mmol/L 108 104 105  CO2 22 - 32 mmol/L _0 Calcium 8.9 - 10.3 mg/dL 8.4(L) 8.8(L) 9.0  Total Protein 6.5 - 8.1 g/dL 5.6(L) 5.5(L) 5.8(L)  Total Bilirubin 0.3 - 1.2 mg/dL 1.2 1.3(H) 1.2  Alkaline Phos 38 - 126 U/L 74 61 79  AST 15 - 41 U/L _1 ALT 0 - 44 U/L _2 RADIOGRAPHIC STUDIES: I have personally reviewed the radiological images as listed and agreed with the findings in the report. No results found.    No orders of the defined types were placed in this encounter.   All questions were answered. The patient knows to call the clinic with any problems, questions or concerns. No barriers to learning was detected.     Ralene Bathe, am acting as scribe for Dr. Truitt Merle, MD.

## 2021-09-01 NOTE — Progress Notes (Signed)
Patient here for therapeutic phlebotomy per Dr. Kayleen Memos. 16 gauge kit used to left AC at 1158 and ended at 1205. 528 grams removed. Snack and drink provided. No issues. Patient waited 30 minutes for post observation no issues.

## 2021-09-05 ENCOUNTER — Telehealth: Payer: Self-pay | Admitting: Hematology

## 2021-09-05 NOTE — Telephone Encounter (Signed)
Scheduled follow-up appointments per 2/16 los. Patient is aware. Mailed calendar.

## 2021-09-30 ENCOUNTER — Other Ambulatory Visit: Payer: Self-pay

## 2021-09-30 ENCOUNTER — Inpatient Hospital Stay (HOSPITAL_BASED_OUTPATIENT_CLINIC_OR_DEPARTMENT_OTHER): Payer: Medicare Other

## 2021-09-30 ENCOUNTER — Inpatient Hospital Stay: Payer: Medicare Other | Attending: Hematology

## 2021-09-30 VITALS — BP 108/56 | HR 80 | Temp 98.4°F | Resp 16

## 2021-09-30 DIAGNOSIS — D45 Polycythemia vera: Secondary | ICD-10-CM

## 2021-09-30 LAB — CBC WITH DIFFERENTIAL (CANCER CENTER ONLY)
Abs Immature Granulocytes: 0.3 10*3/uL — ABNORMAL HIGH (ref 0.00–0.07)
Basophils Absolute: 0.6 10*3/uL — ABNORMAL HIGH (ref 0.0–0.1)
Basophils Relative: 3 %
Eosinophils Absolute: 0.2 10*3/uL (ref 0.0–0.5)
Eosinophils Relative: 1 %
HCT: 48.9 % (ref 39.0–52.0)
Hemoglobin: 14.1 g/dL (ref 13.0–17.0)
Immature Granulocytes: 1 %
Lymphocytes Relative: 5 %
Lymphs Abs: 1.2 10*3/uL (ref 0.7–4.0)
MCH: 27 pg (ref 26.0–34.0)
MCHC: 28.8 g/dL — ABNORMAL LOW (ref 30.0–36.0)
MCV: 93.5 fL (ref 80.0–100.0)
Monocytes Absolute: 1.6 10*3/uL — ABNORMAL HIGH (ref 0.1–1.0)
Monocytes Relative: 7 %
Neutro Abs: 18.9 10*3/uL — ABNORMAL HIGH (ref 1.7–7.7)
Neutrophils Relative %: 83 %
Platelet Count: 33 10*3/uL — ABNORMAL LOW (ref 150–400)
RBC: 5.23 MIL/uL (ref 4.22–5.81)
RDW: 21.2 % — ABNORMAL HIGH (ref 11.5–15.5)
WBC Count: 22.7 10*3/uL — ABNORMAL HIGH (ref 4.0–10.5)
nRBC: 3.3 % — ABNORMAL HIGH (ref 0.0–0.2)

## 2021-09-30 LAB — CMP (CANCER CENTER ONLY)
ALT: 10 U/L (ref 0–44)
AST: 17 U/L (ref 15–41)
Albumin: 3.8 g/dL (ref 3.5–5.0)
Alkaline Phosphatase: 77 U/L (ref 38–126)
Anion gap: 5 (ref 5–15)
BUN: 35 mg/dL — ABNORMAL HIGH (ref 8–23)
CO2: 29 mmol/L (ref 22–32)
Calcium: 8.9 mg/dL (ref 8.9–10.3)
Chloride: 105 mmol/L (ref 98–111)
Creatinine: 1.36 mg/dL — ABNORMAL HIGH (ref 0.61–1.24)
GFR, Estimated: 48 mL/min — ABNORMAL LOW (ref >60)
Glucose, Bld: 129 mg/dL — ABNORMAL HIGH (ref 70–99)
Potassium: 4.3 mmol/L (ref 3.5–5.1)
Sodium: 139 mmol/L (ref 135–145)
Total Bilirubin: 1.3 mg/dL — ABNORMAL HIGH (ref 0.3–1.2)
Total Protein: 5.8 g/dL — ABNORMAL LOW (ref 6.5–8.1)

## 2021-09-30 NOTE — Progress Notes (Signed)
Therapeutic phlebotomy performed for HCT of 48.9 per MD orders. Started at 40 and ended at 7 qith a total of 522G removed. A 16G phlebotomy kit was used on the right AC. IV needle removed, intact. Patient tolerated procedure well, observed for 30 minutes without incident. Provided patient with food and fluids.  ?

## 2021-09-30 NOTE — Patient Instructions (Signed)

## 2021-10-05 ENCOUNTER — Telehealth: Payer: Self-pay

## 2021-10-05 NOTE — Telephone Encounter (Signed)
This nurse spoke with patient and made aware of lab results and recommendations from provider listed below.  Patient acknowledged understanding.  No questions or concerns at this time.   ?

## 2021-10-05 NOTE — Telephone Encounter (Signed)
-----   Message from Alla Feeling, NP sent at 10/03/2021  9:33 AM EDT ----- ?Please let pt know plts remain very low, since they have dropped below 35K have him hold eliquis and monitor for bruising/bleeding. Continue low dose hydrea (500 mg daily MWF).  ? ?Please add on lab in 2 weeks and review schedule with pt. ? ?Thanks, ?Regan Rakers, NP ?

## 2021-10-31 ENCOUNTER — Other Ambulatory Visit: Payer: Self-pay

## 2021-10-31 ENCOUNTER — Inpatient Hospital Stay: Payer: Medicare Other

## 2021-10-31 ENCOUNTER — Inpatient Hospital Stay: Payer: Medicare Other | Attending: Hematology

## 2021-10-31 VITALS — BP 102/64 | HR 79 | Temp 98.4°F | Resp 16

## 2021-10-31 DIAGNOSIS — D45 Polycythemia vera: Secondary | ICD-10-CM | POA: Insufficient documentation

## 2021-10-31 LAB — CBC WITH DIFFERENTIAL (CANCER CENTER ONLY)
Abs Immature Granulocytes: 0.23 10*3/uL — ABNORMAL HIGH (ref 0.00–0.07)
Basophils Absolute: 0.6 10*3/uL — ABNORMAL HIGH (ref 0.0–0.1)
Basophils Relative: 3 %
Eosinophils Absolute: 0.2 10*3/uL (ref 0.0–0.5)
Eosinophils Relative: 1 %
HCT: 47.1 % (ref 39.0–52.0)
Hemoglobin: 13.7 g/dL (ref 13.0–17.0)
Immature Granulocytes: 1 %
Lymphocytes Relative: 7 %
Lymphs Abs: 1.5 10*3/uL (ref 0.7–4.0)
MCH: 27 pg (ref 26.0–34.0)
MCHC: 29.1 g/dL — ABNORMAL LOW (ref 30.0–36.0)
MCV: 92.7 fL (ref 80.0–100.0)
Monocytes Absolute: 1.5 10*3/uL — ABNORMAL HIGH (ref 0.1–1.0)
Monocytes Relative: 7 %
Neutro Abs: 16.4 10*3/uL — ABNORMAL HIGH (ref 1.7–7.7)
Neutrophils Relative %: 81 %
Platelet Count: 32 10*3/uL — ABNORMAL LOW (ref 150–400)
RBC: 5.08 MIL/uL (ref 4.22–5.81)
RDW: 20.7 % — ABNORMAL HIGH (ref 11.5–15.5)
WBC Count: 20.4 10*3/uL — ABNORMAL HIGH (ref 4.0–10.5)
WBC Morphology: 4
nRBC: 4 % — ABNORMAL HIGH (ref 0.0–0.2)

## 2021-10-31 LAB — CMP (CANCER CENTER ONLY)
ALT: 8 U/L (ref 0–44)
AST: 15 U/L (ref 15–41)
Albumin: 3.7 g/dL (ref 3.5–5.0)
Alkaline Phosphatase: 68 U/L (ref 38–126)
Anion gap: 6 (ref 5–15)
BUN: 35 mg/dL — ABNORMAL HIGH (ref 8–23)
CO2: 26 mmol/L (ref 22–32)
Calcium: 8.7 mg/dL — ABNORMAL LOW (ref 8.9–10.3)
Chloride: 106 mmol/L (ref 98–111)
Creatinine: 1.3 mg/dL — ABNORMAL HIGH (ref 0.61–1.24)
GFR, Estimated: 51 mL/min — ABNORMAL LOW (ref >60)
Glucose, Bld: 199 mg/dL — ABNORMAL HIGH (ref 70–99)
Potassium: 4.2 mmol/L (ref 3.5–5.1)
Sodium: 138 mmol/L (ref 135–145)
Total Bilirubin: 1.1 mg/dL (ref 0.3–1.2)
Total Protein: 5.6 g/dL — ABNORMAL LOW (ref 6.5–8.1)

## 2021-10-31 NOTE — Progress Notes (Signed)
[name] [name] presents today for phlebotomy per MD orders. ?Phlebotomy procedure started at 1112 and ended at 1118. A 16G phlebotomy kit was used to the [place].   ?520 grams removed. ?Pt provided drink and snacks. ?Patient observed for 30 minutes after procedure without any incident. ?Patient tolerated procedure well. ?IV needle removed intact. ?VSS at discharge.  ?W/C assist to lobby. ? ? ?

## 2021-10-31 NOTE — Patient Instructions (Signed)

## 2021-11-16 ENCOUNTER — Telehealth: Payer: Self-pay | Admitting: Hematology

## 2021-11-16 NOTE — Telephone Encounter (Signed)
Rescheduled upcoming appointment due to provider's breast clinic. Patient is aware of changes. 

## 2021-11-29 ENCOUNTER — Other Ambulatory Visit: Payer: Self-pay

## 2021-11-29 ENCOUNTER — Inpatient Hospital Stay: Payer: Medicare Other | Attending: Hematology

## 2021-11-29 ENCOUNTER — Inpatient Hospital Stay: Payer: Medicare Other

## 2021-11-29 ENCOUNTER — Inpatient Hospital Stay (HOSPITAL_BASED_OUTPATIENT_CLINIC_OR_DEPARTMENT_OTHER): Payer: Medicare Other | Admitting: Hematology

## 2021-11-29 ENCOUNTER — Encounter: Payer: Self-pay | Admitting: Hematology

## 2021-11-29 VITALS — BP 112/58 | HR 79 | Temp 98.2°F | Resp 19 | Ht 71.0 in | Wt 156.5 lb

## 2021-11-29 DIAGNOSIS — Z79899 Other long term (current) drug therapy: Secondary | ICD-10-CM | POA: Insufficient documentation

## 2021-11-29 DIAGNOSIS — M25559 Pain in unspecified hip: Secondary | ICD-10-CM | POA: Diagnosis not present

## 2021-11-29 DIAGNOSIS — D6959 Other secondary thrombocytopenia: Secondary | ICD-10-CM | POA: Insufficient documentation

## 2021-11-29 DIAGNOSIS — Z7901 Long term (current) use of anticoagulants: Secondary | ICD-10-CM | POA: Insufficient documentation

## 2021-11-29 DIAGNOSIS — D45 Polycythemia vera: Secondary | ICD-10-CM | POA: Diagnosis not present

## 2021-11-29 DIAGNOSIS — M25569 Pain in unspecified knee: Secondary | ICD-10-CM | POA: Insufficient documentation

## 2021-11-29 DIAGNOSIS — Z86718 Personal history of other venous thrombosis and embolism: Secondary | ICD-10-CM | POA: Insufficient documentation

## 2021-11-29 LAB — CMP (CANCER CENTER ONLY)
ALT: 10 U/L (ref 0–44)
AST: 15 U/L (ref 15–41)
Albumin: 3.7 g/dL (ref 3.5–5.0)
Alkaline Phosphatase: 62 U/L (ref 38–126)
Anion gap: 6 (ref 5–15)
BUN: 39 mg/dL — ABNORMAL HIGH (ref 8–23)
CO2: 27 mmol/L (ref 22–32)
Calcium: 8.8 mg/dL — ABNORMAL LOW (ref 8.9–10.3)
Chloride: 104 mmol/L (ref 98–111)
Creatinine: 1.4 mg/dL — ABNORMAL HIGH (ref 0.61–1.24)
GFR, Estimated: 46 mL/min — ABNORMAL LOW (ref >60)
Glucose, Bld: 177 mg/dL — ABNORMAL HIGH (ref 70–99)
Potassium: 4.5 mmol/L (ref 3.5–5.1)
Sodium: 137 mmol/L (ref 135–145)
Total Bilirubin: 1.2 mg/dL (ref 0.3–1.2)
Total Protein: 5.6 g/dL — ABNORMAL LOW (ref 6.5–8.1)

## 2021-11-29 LAB — CBC WITH DIFFERENTIAL (CANCER CENTER ONLY)
Abs Immature Granulocytes: 0.13 10*3/uL — ABNORMAL HIGH (ref 0.00–0.07)
Basophils Absolute: 0.5 10*3/uL — ABNORMAL HIGH (ref 0.0–0.1)
Basophils Relative: 3 %
Eosinophils Absolute: 0.2 10*3/uL (ref 0.0–0.5)
Eosinophils Relative: 1 %
HCT: 45 % (ref 39.0–52.0)
Hemoglobin: 13.4 g/dL (ref 13.0–17.0)
Immature Granulocytes: 1 %
Lymphocytes Relative: 7 %
Lymphs Abs: 1 10*3/uL (ref 0.7–4.0)
MCH: 27.5 pg (ref 26.0–34.0)
MCHC: 29.8 g/dL — ABNORMAL LOW (ref 30.0–36.0)
MCV: 92.4 fL (ref 80.0–100.0)
Monocytes Absolute: 1.1 10*3/uL — ABNORMAL HIGH (ref 0.1–1.0)
Monocytes Relative: 7 %
Neutro Abs: 12.8 10*3/uL — ABNORMAL HIGH (ref 1.7–7.7)
Neutrophils Relative %: 81 %
Platelet Count: 20 10*3/uL — ABNORMAL LOW (ref 150–400)
RBC: 4.87 MIL/uL (ref 4.22–5.81)
RDW: 21.1 % — ABNORMAL HIGH (ref 11.5–15.5)
WBC Count: 15.7 10*3/uL — ABNORMAL HIGH (ref 4.0–10.5)
nRBC: 3.7 % — ABNORMAL HIGH (ref 0.0–0.2)

## 2021-11-29 NOTE — Progress Notes (Signed)
?[place]   ?Telephone:(336) [phone] [phone]   ?Clinic Follow up Note  ? ?Patient Care Team: ?[name], MD as PCP - General (Internal Medicine) ? ?Date of Service:  11/29/2021 ? ?CHIEF COMPLAINT: f/u of Polycythemia Vera ? ?CURRENT THERAPY:  ?1. Phlebotomy as needed if HCT>47%. ?2. Hydrea, since 02/2015, dose adjusted as needed. Will hold due to thrombocytopenia  ?3. Eliquis 2.31m daily (switched from Xarelto). Will hold due to thrombocytopenia  ? ?ASSESSMENT & PLAN:  ?REAN GETTELis a 88y.o. male with  ? ?1. Polycythemia vera diagnosed in 1997, JAK2 mutation positive.  ?-We previously reviewed the nature history of PV, most people do very well, some people would develop myelofibrosis or leukemia late on. The major complication is thrombosis. ?-He has been on Hydrea 500 mg daily for 4-5 years with dose adjustment as needed. ?-We will continue phlebotomy if hematocrit more than 47% (instead of 45% due to his advanced age).  ?-Pathologist smear review on 07/28/21 showed leukocytosis with 2% blasts. ?-Due to his advanced age, and limited treatment options, will hold on bone marrow biopsy ?-Lab reviewed, no need for phlebotomy today. His platelet count is down to 20k today, I advised him to discontinue the eliquis and the hydrea altogether. ?  ?2. Thrombocytopenia, secondary to hydrea and MPN   ?-Hydrea has been dose adjusted as needed.  ?-Goal PLT above 70-80K.  ?-His plt has further decreased. Plt 20k today (11/29/21) ?-we again discuss the possibility of PV progression to myelofibrosis. Due to his advanced age, will hold on bone marrow biopsy now. ?-because of his low plt, we will hold Eliquis and Hydrea. ? ?3. RLE DVT, LLE DVT in 02/2015, likely secondary to #1 ?-Continue anticoagulation indefinitely, due to recurrent DVT.  ?-He knows to be careful about fall and injury. He reports gum bleeding. ?-He had vascular surgery on his left leg on 09/04/16 for poor circulation by Dr.  LKellie Simmering ?-he was started on Xarelto but switched to Eliquis due to dosing and kidney function in 08/2021. Will hold due to thrombocytopenia. ?  ?4. Comorbidities: Hip and knee pain, PAD ?-He notes he uses Tylenol as needed for joint pain. He knows to avoid NSAIDs. I discussed starting Tumeric supplement to help his joint pain.  ?-He has H/o Left lower lateral ankle ulcer in 2021 which resolved after 6 months of slow healing ?  ?   ?PLAN:  ?-no need for phlebotomy today  ?-hold hydrea and eliquis due to his severe thrombocytopenia, he knows to call [place] has worsening signs of bleeding. ?-Lab every 4 weeks with phlebotomy for Hct >47% ?-F/u in 3 months  ? ? ?No problem-specific Assessment & Plan notes found for this encounter. ? ? ?INTERVAL HISTORY:  ?[name] [name] here for a follow up of Polycythemia Vera. He was last seen by me on 09/01/21. He presents to the clinic alone. ?He reports he is doing well overall, aside from his mobility issues. He tells me he and his wife continue to live together and have a home nurse come in and help them. ?  ?All other systems were reviewed with the patient and are negative. ? ?MEDICAL HISTORY:  ?Past Medical History:  ?Diagnosis Date  ? Bone marrow disease   ? BPH (benign prostatic hypertrophy)   ? Depression   ? DJD (degenerative joint disease)   ? Dyslipidemia   ? Gout   ? HTN (hypertension)   ? Mild aortic stenosis   ?  Polycythemia vera(238.4)   ? ? ?SURGICAL HISTORY: ?Past Surgical History:  ?Procedure Laterality Date  ? ENDOVENOUS ABLATION SAPHENOUS VEIN W/ LASER Left 09/04/2016  ? endovenous laser ablation left greater saphenous vein by Tinnie Gens MD  ? HEMORROIDECTOMY    ? ? ?I have reviewed the social history and family history with the patient and they are unchanged from previous note. ? ?ALLERGIES:  is allergic to amoxicillin and colchicine. ? ?MEDICATIONS:  ?Current Outpatient Medications  ?Medication Sig Dispense Refill  ? acetaminophen (TYLENOL) 500 MG tablet  Take 650 mg by mouth every 6 (six) hours as needed.     ? allopurinol (ZYLOPRIM) 100 MG tablet Take 100 mg by mouth 2 (two) times daily.     ? apixaban (ELIQUIS) 2.5 MG TABS tablet Take 1 tablet (2.5 mg total) by mouth daily. 180 tablet 1  ? cholecalciferol (VITAMIN D3) 25 MCG (1000 UNIT) tablet Take 1,000 Units by mouth daily.    ? escitalopram (LEXAPRO) 10 MG tablet Take 5 mg by mouth at bedtime.     ? glucosamine-chondroitin 500-400 MG tablet Take 1 tablet by mouth daily.    ? hydroxyurea (HYDREA) 500 MG capsule Take 1 capsule (500 mg total) by mouth daily. On Mondays, Wednesdays and Fridays only, 3 times a week 50 capsule 1  ? lisinopril (PRINIVIL,ZESTRIL) 10 MG tablet Take 10 mg by mouth daily.    ? Multiple Vitamin (MULTIVITAMIN WITH MINERALS) TABS tablet Take 1 tablet by mouth daily.    ? omega-3 acid ethyl esters (LOVAZA) 1 g capsule Take 1 g by mouth daily.    ? terazosin (HYTRIN) 5 MG capsule Take 5 mg by mouth at bedtime.    ? vitamin E (VITAMIN E) 400 UNIT capsule Take 400 Units by mouth daily.    ? ?No current facility-administered medications for this visit.  ? ? ?PHYSICAL EXAMINATION: ?ECOG PERFORMANCE STATUS: 3 - Symptomatic, >50% confined to bed ? ?Vitals:  ? 11/29/21 1104  ?BP: (!) 112/58  ?Pulse: 79  ?Resp: 19  ?Temp: 98.2 ?F (36.8 ?C)  ?SpO2: 99%  ? ?Wt Readings from Last 3 Encounters:  ?11/29/21 156 lb 8 oz (71 kg)  ?09/01/21 157 lb 4.8 oz (71.4 kg)  ?05/30/21 161 lb 9.6 oz (73.3 kg)  ?  ? ?GENERAL:alert, no distress and comfortable ?SKIN: skin color normal, no rashes or significant lesions ?EYES: normal, Conjunctiva are pink and non-injected, sclera clear  ?NEURO: alert & oriented x 3 with fluent speech ? ?LABORATORY DATA:  ?I have reviewed the data as listed ? ?  Latest Ref Rng & Units 11/29/2021  ? 10:53 AM 10/31/2021  ? 10:32 AM 09/30/2021  ? 10:16 AM  ?CBC  ?WBC 4.0 - 10.5 K/uL 15.7   20.4   22.7    ?Hemoglobin 13.0 - 17.0 g/dL 13.4   13.7   14.1    ?Hematocrit 39.0 - 52.0 % 45.0   47.1   48.9     ?Platelets 150 - 400 K/uL 20   32   33    ? ? ? ? ?  Latest Ref Rng & Units 11/29/2021  ? 10:53 AM 10/31/2021  ? 10:32 AM 09/30/2021  ? 10:16 AM  ?CMP  ?Glucose 70 - 99 mg/dL 177   199   129    ?BUN 8 - 23 mg/dL 39   35   35    ?Creatinine 0.61 - 1.24 mg/dL 1.40   1.30   1.36    ?Sodium 135 -  145 mmol/L 137   138   139    ?Potassium 3.5 - 5.1 mmol/L 4.5   4.2   4.3    ?Chloride 98 - 111 mmol/L 104   106   105    ?CO2 22 - 32 mmol/L _0 ?Calcium 8.9 - 10.3 mg/dL 8.8   8.7   8.9    ?Total Protein 6.5 - 8.1 g/dL 5.6   5.6   5.8    ?Total Bilirubin 0.3 - 1.2 mg/dL 1.2   1.1   1.3    ?Alkaline Phos 38 - 126 U/L 62   68   77    ?AST 15 - 41 U/L _1 ?ALT 0 - 44 U/L _2 ? ? ? ? ?RADIOGRAPHIC STUDIES: ?I have personally reviewed the radiological images as listed and agreed with the findings in the report. ?No results found.  ? ? ?No orders of the defined types were placed in this encounter. ? ?All questions were answered. The patient knows to call the clinic with any problems, questions or concerns. No barriers to learning was detected. ?The total time spent in the appointment was 20 minutes. ? ?  ? Truitt Merle, MD ?11/29/2021  ? ?I, Wilburn Mylar, am acting as scribe for Truitt Merle, MD.  ? ?I have reviewed the above documentation for accuracy and completeness, and I agree with the above. ?  ? ? ?

## 2021-11-30 ENCOUNTER — Ambulatory Visit: Payer: Medicare Other

## 2021-11-30 ENCOUNTER — Ambulatory Visit: Payer: Medicare Other | Admitting: Hematology

## 2021-11-30 ENCOUNTER — Other Ambulatory Visit: Payer: Medicare Other

## 2021-12-30 ENCOUNTER — Inpatient Hospital Stay: Payer: Medicare Other

## 2021-12-30 ENCOUNTER — Other Ambulatory Visit: Payer: Self-pay

## 2021-12-30 ENCOUNTER — Inpatient Hospital Stay: Payer: Medicare Other | Attending: Hematology

## 2021-12-30 DIAGNOSIS — D45 Polycythemia vera: Secondary | ICD-10-CM | POA: Insufficient documentation

## 2021-12-30 LAB — CBC WITH DIFFERENTIAL (CANCER CENTER ONLY)
Abs Immature Granulocytes: 0.21 10*3/uL — ABNORMAL HIGH (ref 0.00–0.07)
Basophils Absolute: 0.6 10*3/uL — ABNORMAL HIGH (ref 0.0–0.1)
Basophils Relative: 3 %
Eosinophils Absolute: 0.1 10*3/uL (ref 0.0–0.5)
Eosinophils Relative: 1 %
HCT: 46.8 % (ref 39.0–52.0)
Hemoglobin: 13.8 g/dL (ref 13.0–17.0)
Immature Granulocytes: 1 %
Lymphocytes Relative: 7 %
Lymphs Abs: 1.4 10*3/uL (ref 0.7–4.0)
MCH: 26.5 pg (ref 26.0–34.0)
MCHC: 29.5 g/dL — ABNORMAL LOW (ref 30.0–36.0)
MCV: 89.8 fL (ref 80.0–100.0)
Monocytes Absolute: 1.8 10*3/uL — ABNORMAL HIGH (ref 0.1–1.0)
Monocytes Relative: 10 %
Neutro Abs: 14.6 10*3/uL — ABNORMAL HIGH (ref 1.7–7.7)
Neutrophils Relative %: 78 %
Platelet Count: 29 10*3/uL — ABNORMAL LOW (ref 150–400)
RBC: 5.21 MIL/uL (ref 4.22–5.81)
RDW: 19.7 % — ABNORMAL HIGH (ref 11.5–15.5)
WBC Count: 18.8 10*3/uL — ABNORMAL HIGH (ref 4.0–10.5)
nRBC: 8.2 % — ABNORMAL HIGH (ref 0.0–0.2)

## 2021-12-30 LAB — CMP (CANCER CENTER ONLY)
ALT: 8 U/L (ref 0–44)
AST: 13 U/L — ABNORMAL LOW (ref 15–41)
Albumin: 3.7 g/dL (ref 3.5–5.0)
Alkaline Phosphatase: 69 U/L (ref 38–126)
Anion gap: 6 (ref 5–15)
BUN: 34 mg/dL — ABNORMAL HIGH (ref 8–23)
CO2: 26 mmol/L (ref 22–32)
Calcium: 9 mg/dL (ref 8.9–10.3)
Chloride: 107 mmol/L (ref 98–111)
Creatinine: 1.28 mg/dL — ABNORMAL HIGH (ref 0.61–1.24)
GFR, Estimated: 52 mL/min — ABNORMAL LOW (ref >60)
Glucose, Bld: 118 mg/dL — ABNORMAL HIGH (ref 70–99)
Potassium: 4.3 mmol/L (ref 3.5–5.1)
Sodium: 139 mmol/L (ref 135–145)
Total Bilirubin: 1.4 mg/dL — ABNORMAL HIGH (ref 0.3–1.2)
Total Protein: 5.5 g/dL — ABNORMAL LOW (ref 6.5–8.1)

## 2021-12-30 NOTE — Progress Notes (Signed)
Hct today 46.8.  Per Dr Burr Medico, phlebotomy needed if hct greater than 47.  Reviewed with MD, phlebotomy not needed today.  Reviewed labs and upcoming schedule with pt.  Pt verbalized understanding.  Pt d/c'd with caretaker via wheelchair.

## 2022-01-11 DIAGNOSIS — I10 Essential (primary) hypertension: Secondary | ICD-10-CM | POA: Diagnosis not present

## 2022-01-11 DIAGNOSIS — M1990 Unspecified osteoarthritis, unspecified site: Secondary | ICD-10-CM | POA: Diagnosis not present

## 2022-01-11 DIAGNOSIS — E785 Hyperlipidemia, unspecified: Secondary | ICD-10-CM | POA: Diagnosis not present

## 2022-01-11 DIAGNOSIS — I739 Peripheral vascular disease, unspecified: Secondary | ICD-10-CM | POA: Diagnosis not present

## 2022-01-11 DIAGNOSIS — I350 Nonrheumatic aortic (valve) stenosis: Secondary | ICD-10-CM | POA: Diagnosis not present

## 2022-01-11 DIAGNOSIS — D45 Polycythemia vera: Secondary | ICD-10-CM | POA: Diagnosis not present

## 2022-01-11 DIAGNOSIS — I87332 Chronic venous hypertension (idiopathic) with ulcer and inflammation of left lower extremity: Secondary | ICD-10-CM | POA: Diagnosis not present

## 2022-01-11 DIAGNOSIS — D696 Thrombocytopenia, unspecified: Secondary | ICD-10-CM | POA: Diagnosis not present

## 2022-01-11 DIAGNOSIS — F329 Major depressive disorder, single episode, unspecified: Secondary | ICD-10-CM | POA: Diagnosis not present

## 2022-01-11 DIAGNOSIS — K4090 Unilateral inguinal hernia, without obstruction or gangrene, not specified as recurrent: Secondary | ICD-10-CM | POA: Diagnosis not present

## 2022-01-11 DIAGNOSIS — R410 Disorientation, unspecified: Secondary | ICD-10-CM | POA: Diagnosis not present

## 2022-01-11 DIAGNOSIS — I720 Aneurysm of carotid artery: Secondary | ICD-10-CM | POA: Diagnosis not present

## 2022-01-18 ENCOUNTER — Telehealth: Payer: Self-pay

## 2022-01-18 NOTE — Telephone Encounter (Signed)
Spoke with pt's son regarding his dad's recent changes.  Pt's son stated pt has memory loss, extremely fatigue, haven't had therapeutic phlebotomies done in 2 months, when pt is lying down his O2 SATS are around 70 to 73% and when sitting up between 90 to 91% on room air.  Pt's son said these symptoms started 6 wks ago and have progressed over time.  Pt is being seen by Dr. Burr Medico for Polycythemia with a HCT <47.  Pt's last HCT was 46.8 which ws in goal range; therefore, no therapeutic phlebotomy was needed.  Pt's plts continue to be low at 29 which way below goal range of 70-80K; therefore, in May 2023 Dr. Burr Medico instructed pt to stop taking the Xarelto and Hydrea d/t thrombocytopenia.  Asked pt's son has the pt stopped taking these medications.  Pt's son said he was unsure d/t he's not at home at this time to confirm the pt's medications.  Pt has a hx of DVTs; therefore, will require to take Xarelto indefinitely per Dr. Ernestina Penna not but is currently being held d/t the pt's thrombocytopenia.  Pt's son stated that he and the pt's home health nurse has been monitoring the pt's O2 SATS daily throughout the day.  Instructed pt's son since the pt's O2 SATS are low, take pt to the Emergency Room to r/o DVT since the pt is off Xarelto at this time.  Pt's son stated he will try to get the pt to go to the ED but cannot make any guarantees d/t the pt may not want to go.  Informed son that this RN will notify Dr. Burr Medico of the pt's current symptoms.  Sent Patient Call message to Dr. Burr Medico regarding pt's symptoms.

## 2022-01-30 ENCOUNTER — Inpatient Hospital Stay: Payer: Medicare Other | Attending: Hematology

## 2022-01-30 ENCOUNTER — Other Ambulatory Visit: Payer: Self-pay

## 2022-01-30 ENCOUNTER — Inpatient Hospital Stay: Payer: Medicare Other

## 2022-01-30 VITALS — BP 102/62 | HR 78 | Temp 98.0°F | Resp 18

## 2022-01-30 DIAGNOSIS — D45 Polycythemia vera: Secondary | ICD-10-CM | POA: Diagnosis not present

## 2022-01-30 LAB — CBC WITH DIFFERENTIAL (CANCER CENTER ONLY)
Abs Immature Granulocytes: 0.3 10*3/uL — ABNORMAL HIGH (ref 0.00–0.07)
Basophils Absolute: 0.7 10*3/uL — ABNORMAL HIGH (ref 0.0–0.1)
Basophils Relative: 3 %
Eosinophils Absolute: 0.2 10*3/uL (ref 0.0–0.5)
Eosinophils Relative: 1 %
HCT: 50.1 % (ref 39.0–52.0)
Hemoglobin: 14.8 g/dL (ref 13.0–17.0)
Immature Granulocytes: 2 %
Lymphocytes Relative: 10 %
Lymphs Abs: 2 10*3/uL (ref 0.7–4.0)
MCH: 25.5 pg — ABNORMAL LOW (ref 26.0–34.0)
MCHC: 29.5 g/dL — ABNORMAL LOW (ref 30.0–36.0)
MCV: 86.4 fL (ref 80.0–100.0)
Monocytes Absolute: 2 10*3/uL — ABNORMAL HIGH (ref 0.1–1.0)
Monocytes Relative: 10 %
Neutro Abs: 14.8 10*3/uL — ABNORMAL HIGH (ref 1.7–7.7)
Neutrophils Relative %: 74 %
Platelet Count: 23 10*3/uL — ABNORMAL LOW (ref 150–400)
RBC: 5.8 MIL/uL (ref 4.22–5.81)
RDW: 20.9 % — ABNORMAL HIGH (ref 11.5–15.5)
WBC Count: 19.9 10*3/uL — ABNORMAL HIGH (ref 4.0–10.5)
WBC Morphology: 2
nRBC: 9.8 % — ABNORMAL HIGH (ref 0.0–0.2)

## 2022-01-30 LAB — CMP (CANCER CENTER ONLY)
ALT: 8 U/L (ref 0–44)
AST: 15 U/L (ref 15–41)
Albumin: 3.8 g/dL (ref 3.5–5.0)
Alkaline Phosphatase: 71 U/L (ref 38–126)
Anion gap: 4 — ABNORMAL LOW (ref 5–15)
BUN: 32 mg/dL — ABNORMAL HIGH (ref 8–23)
CO2: 27 mmol/L (ref 22–32)
Calcium: 9 mg/dL (ref 8.9–10.3)
Chloride: 107 mmol/L (ref 98–111)
Creatinine: 1.21 mg/dL (ref 0.61–1.24)
GFR, Estimated: 55 mL/min — ABNORMAL LOW (ref >60)
Glucose, Bld: 61 mg/dL — ABNORMAL LOW (ref 70–99)
Potassium: 4.6 mmol/L (ref 3.5–5.1)
Sodium: 138 mmol/L (ref 135–145)
Total Bilirubin: 1.5 mg/dL — ABNORMAL HIGH (ref 0.3–1.2)
Total Protein: 5.7 g/dL — ABNORMAL LOW (ref 6.5–8.1)

## 2022-01-30 NOTE — Progress Notes (Signed)
[name] [name] presents today for phlebotomy per MD orders. Phlebotomy procedure started at 1145 and ended at 1155. 522 grams removed using a 16g phlebotomy kit. Patient observed for 30 minutes after procedure without any incident. Patient tolerated procedure well. VSS see flowsheet. Patient accepted nutrition and was discharged via wheelchair to his friend [name] [name]. IV needle removed intact.

## 2022-01-30 NOTE — Patient Instructions (Signed)

## 2022-02-02 ENCOUNTER — Telehealth: Payer: Self-pay

## 2022-02-02 NOTE — Telephone Encounter (Signed)
-----   Message from Alla Feeling, NP sent at 02/01/2022  8:50 AM EDT ----- Platelets remain very low, please call pt to confirm he is NOT taking hydrea or eliquis. Please assess for bleeding. And remind him of next appts. Thanks, Regan Rakers, NP

## 2022-02-02 NOTE — Telephone Encounter (Signed)
This nurse spoke with patient's son and made sure who verified that patient is no longer taking Hydrea and Eliquis.  Reminded of patients appointments. No questions or concerns at this time.

## 2022-03-01 ENCOUNTER — Inpatient Hospital Stay: Payer: Medicare Other | Attending: Hematology | Admitting: Hematology

## 2022-03-01 ENCOUNTER — Encounter: Payer: Self-pay | Admitting: Hematology

## 2022-03-01 ENCOUNTER — Inpatient Hospital Stay: Payer: Medicare Other

## 2022-03-01 VITALS — BP 105/64 | HR 77 | Temp 98.0°F | Wt 151.7 lb

## 2022-03-01 DIAGNOSIS — D45 Polycythemia vera: Secondary | ICD-10-CM

## 2022-03-01 DIAGNOSIS — Z7901 Long term (current) use of anticoagulants: Secondary | ICD-10-CM | POA: Diagnosis not present

## 2022-03-01 DIAGNOSIS — Z79899 Other long term (current) drug therapy: Secondary | ICD-10-CM | POA: Diagnosis not present

## 2022-03-01 DIAGNOSIS — Z86718 Personal history of other venous thrombosis and embolism: Secondary | ICD-10-CM | POA: Insufficient documentation

## 2022-03-01 DIAGNOSIS — D696 Thrombocytopenia, unspecified: Secondary | ICD-10-CM | POA: Diagnosis not present

## 2022-03-01 DIAGNOSIS — D6959 Other secondary thrombocytopenia: Secondary | ICD-10-CM | POA: Diagnosis not present

## 2022-03-01 LAB — CMP (CANCER CENTER ONLY)
ALT: 6 U/L (ref 0–44)
AST: 12 U/L — ABNORMAL LOW (ref 15–41)
Albumin: 3.5 g/dL (ref 3.5–5.0)
Alkaline Phosphatase: 69 U/L (ref 38–126)
Anion gap: 3 — ABNORMAL LOW (ref 5–15)
BUN: 30 mg/dL — ABNORMAL HIGH (ref 8–23)
CO2: 29 mmol/L (ref 22–32)
Calcium: 8.3 mg/dL — ABNORMAL LOW (ref 8.9–10.3)
Chloride: 105 mmol/L (ref 98–111)
Creatinine: 1.22 mg/dL (ref 0.61–1.24)
GFR, Estimated: 55 mL/min — ABNORMAL LOW (ref >60)
Glucose, Bld: 204 mg/dL — ABNORMAL HIGH (ref 70–99)
Potassium: 4.3 mmol/L (ref 3.5–5.1)
Sodium: 137 mmol/L (ref 135–145)
Total Bilirubin: 1.5 mg/dL — ABNORMAL HIGH (ref 0.3–1.2)
Total Protein: 5.3 g/dL — ABNORMAL LOW (ref 6.5–8.1)

## 2022-03-01 LAB — CBC WITH DIFFERENTIAL (CANCER CENTER ONLY)
Abs Immature Granulocytes: 0.24 10*3/uL — ABNORMAL HIGH (ref 0.00–0.07)
Basophils Absolute: 0.6 10*3/uL — ABNORMAL HIGH (ref 0.0–0.1)
Basophils Relative: 4 %
Eosinophils Absolute: 0.2 10*3/uL (ref 0.0–0.5)
Eosinophils Relative: 1 %
HCT: 47.6 % (ref 39.0–52.0)
Hemoglobin: 13.9 g/dL (ref 13.0–17.0)
Immature Granulocytes: 2 %
Lymphocytes Relative: 7 %
Lymphs Abs: 1.2 10*3/uL (ref 0.7–4.0)
MCH: 25.1 pg — ABNORMAL LOW (ref 26.0–34.0)
MCHC: 29.2 g/dL — ABNORMAL LOW (ref 30.0–36.0)
MCV: 85.9 fL (ref 80.0–100.0)
Monocytes Absolute: 1.7 10*3/uL — ABNORMAL HIGH (ref 0.1–1.0)
Monocytes Relative: 11 %
Neutro Abs: 12.4 10*3/uL — ABNORMAL HIGH (ref 1.7–7.7)
Neutrophils Relative %: 75 %
Platelet Count: 19 10*3/uL — ABNORMAL LOW (ref 150–400)
RBC: 5.54 MIL/uL (ref 4.22–5.81)
RDW: 22 % — ABNORMAL HIGH (ref 11.5–15.5)
WBC Count: 16.3 10*3/uL — ABNORMAL HIGH (ref 4.0–10.5)
nRBC: 11.2 % — ABNORMAL HIGH (ref 0.0–0.2)

## 2022-03-01 NOTE — Progress Notes (Signed)
[place]   [phone] [phone]   Clinic Follow up Note   Patient Care Team: [name], MD as PCP - General (Internal Medicine)  Date of Service:  03/01/2022  CHIEF COMPLAINT: f/u of Polycythemia Vera  CURRENT THERAPY:  Phlebotomy as needed if HCT>50%.  ASSESSMENT & PLAN:  [name] [name] is a 88 y.o. male with   1. Polycythemia vera diagnosed in 1997, JAK2 mutation positive.  -He has been on Hydrea since 02/2015 with dose adjustment as needed. -he also has phlebotomy if hematocrit more than 47% (instead of 45% due to his advanced age).  -Pathologist smear review on 07/28/21 showed leukocytosis with 2% blasts. -Due to his advanced age, and limited treatment options, will hold on bone marrow biopsy -Lab reviewed, HCT 47.6. We will hold on phlebotomy today, prior was 01/30/22. Platelets 19k today, will continue to hold eliquis and hydrea due to severe thrombocytopenia. -I discussed option of TPO such as oral Promact for his severe thrombocytopenia, but it is off label use.  I discussed the potential benefit and side effects, and requirement of frequent lab monitoring when he is on it. -Due to his advanced age, and poor performance status, no active bleeding issue, we decided to hold on TPO for now, but may try if he develops active bleeding.  -we discussed continuing phlebotomy versus moving to surveillance, given his advanced age and need for assistance.  He states he still want to be checked and have treatment prevent a stroke.  I recommend spreading out his appointments to every 3 months and change the [facility] for phlebotomy to 50%, he is agreeable.   2. Thrombocytopenia, secondary to hydrea and MPN   -Hydrea has been dose adjusted as needed.  -Goal PLT above 70-80K.  -platelet count overall stable from prior visit in 11/2021 despite holding hydrea. Plt 19k today (03/01/22). -I discussed the option of promacta to help boost his platelet  count. We will hold on this for now.   3. RLE DVT, LLE DVT in 02/2015, likely secondary to #1 -Continue anticoagulation indefinitely, due to recurrent DVT.  -s/p LLE vascular surgery on 09/04/16 for poor circulation by Dr. Kellie Simmering. -he was started on Xarelto but switched to Eliquis due to dosing and kidney function in 08/2021. On hold since 11/2021 due to thrombocytopenia.   4. Comorbidities: Hip and knee pain, PAD -He notes he uses Tylenol as needed for joint pain. He knows to avoid NSAIDs. I discussed starting Tumeric supplement to help his joint pain.  -He has H/o Left lower lateral ankle ulcer in 2021 which resolved after 6 months of slow healing      PLAN:  -no phlebotomy today  -continue holding hydrea and eliquis due to his severe thrombocytopenia, he knows to call us if he has worsening signs of bleeding. -Lab in 3 and 6 months with phlebotomy for Hct >50% -F/u in 6 months    No problem-specific Assessment & Plan notes found for this encounter.   INTERVAL HISTORY:  [name] [name] is here for a follow up of Polycythemia Vera. He was last seen by me on 11/29/21. He presents to the clinic accompanied by a friend (and prior student). He reports he is doing okay overall. He reports he and his wife continue to have a nurse come in and help them daily during the day. He endorses using a walker at home and denies recent falls.   All other systems were reviewed with the patient and are  negative.  MEDICAL HISTORY:  Past Medical History:  Diagnosis Date   Bone marrow disease    BPH (benign prostatic hypertrophy)    Depression    DJD (degenerative joint disease)    Dyslipidemia    Gout    HTN (hypertension)    Mild aortic stenosis    Polycythemia vera(238.4)     SURGICAL HISTORY: Past Surgical History:  Procedure Laterality Date   ENDOVENOUS ABLATION SAPHENOUS VEIN W/ LASER Left 09/04/2016   endovenous laser ablation left greater saphenous vein by Tinnie Gens MD    HEMORROIDECTOMY      I have reviewed the social history and family history with the patient and they are unchanged from previous note.  ALLERGIES:  is allergic to amoxicillin and colchicine.  MEDICATIONS:  Current Outpatient Medications  Medication Sig Dispense Refill   acetaminophen (TYLENOL) 500 MG tablet Take 650 mg by mouth every 6 (six) hours as needed.      allopurinol (ZYLOPRIM) 100 MG tablet Take 100 mg by mouth 2 (two) times daily.      cholecalciferol (VITAMIN D3) 25 MCG (1000 UNIT) tablet Take 1,000 Units by mouth daily.     escitalopram (LEXAPRO) 10 MG tablet Take 5 mg by mouth at bedtime.      glucosamine-chondroitin 500-400 MG tablet Take 1 tablet by mouth daily.     Multiple Vitamin (MULTIVITAMIN WITH MINERALS) TABS tablet Take 1 tablet by mouth daily.     omega-3 acid ethyl esters (LOVAZA) 1 g capsule Take 1 g by mouth daily.     terazosin (HYTRIN) 5 MG capsule Take 5 mg by mouth at bedtime.     vitamin E (VITAMIN E) 400 UNIT capsule Take 400 Units by mouth daily.     No current facility-administered medications for this visit.    PHYSICAL EXAMINATION: ECOG PERFORMANCE STATUS: 3 - Symptomatic, >50% confined to bed  Vitals:   03/01/22 1125  BP: 105/64  Pulse: 77  Temp: 98 F (36.7 C)  SpO2: 95%   Wt Readings from Last 3 Encounters:  03/01/22 151 lb 11.2 oz (68.8 kg)  11/29/21 156 lb 8 oz (71 kg)  09/01/21 157 lb 4.8 oz (71.4 kg)     GENERAL:alert, no distress and comfortable SKIN: skin color normal, no rashes or significant lesions EYES: normal, Conjunctiva are pink and non-injected, sclera clear  NEURO: alert & oriented x 3 with fluent speech  LABORATORY DATA:  I have reviewed the data as listed    Latest Ref Rng & Units 03/01/2022   10:57 AM 01/30/2022   11:05 AM 12/30/2021   10:41 AM  CBC  WBC 4.0 - 10.5 K/uL 16.3  19.9  18.8   Hemoglobin 13.0 - 17.0 g/dL 13.9  14.8  13.8   Hematocrit 39.0 - 52.0 % 47.6  50.1  46.8   Platelets 150 - 400 K/uL '19   23  29         ' Latest Ref Rng & Units 03/01/2022   10:57 AM 01/30/2022   11:05 AM 12/30/2021   10:41 AM  CMP  Glucose 70 - 99 mg/dL 204  61  118   BUN 8 - 23 mg/dL 30  32  34   Creatinine 0.61 - 1.24 mg/dL 1.22  1.21  1.28   Sodium 135 - 145 mmol/L 137  138  139   Potassium 3.5 - 5.1 mmol/L 4.3  4.6  4.3   Chloride 98 - 111 mmol/L 105  107  107  CO2 22 - 32 mmol/L '29  27  26   ' Calcium 8.9 - 10.3 mg/dL 8.3  9.0  9.0   Total Protein 6.5 - 8.1 g/dL 5.3  5.7  5.5   Total Bilirubin 0.3 - 1.2 mg/dL 1.5  1.5  1.4   Alkaline Phos 38 - 126 U/L 69  71  69   AST 15 - 41 U/L '12  15  13   ' ALT 0 - 44 U/L '6  8  8       ' RADIOGRAPHIC STUDIES: I have personally reviewed the radiological images as listed and agreed with the findings in the report. No results found.    No orders of the defined types were placed in this encounter.  All questions were answered. The patient knows to call the clinic with any problems, questions or concerns. No barriers to learning was detected. The total time spent in the appointment was 30 minutes.     Truitt Merle, MD 03/01/2022   I, Wilburn Mylar, am acting as scribe for Truitt Merle, MD.   I have reviewed the above documentation for accuracy and completeness, and I agree with the above.

## 2022-03-15 ENCOUNTER — Inpatient Hospital Stay (HOSPITAL_BASED_OUTPATIENT_CLINIC_OR_DEPARTMENT_OTHER): Payer: Medicare Other

## 2022-03-15 ENCOUNTER — Other Ambulatory Visit: Payer: Self-pay

## 2022-03-15 ENCOUNTER — Encounter (HOSPITAL_COMMUNITY): Payer: Self-pay | Admitting: Emergency Medicine

## 2022-03-15 ENCOUNTER — Inpatient Hospital Stay (HOSPITAL_COMMUNITY)
Admission: EM | Admit: 2022-03-15 | Discharge: 2022-03-23 | DRG: 871 | Disposition: A | Discharge status: Home Health | Payer: Medicare Other | Attending: Internal Medicine | Admitting: Internal Medicine

## 2022-03-15 ENCOUNTER — Emergency Department (HOSPITAL_COMMUNITY): Payer: Medicare Other

## 2022-03-15 ENCOUNTER — Inpatient Hospital Stay (HOSPITAL_COMMUNITY): Payer: Medicare Other

## 2022-03-15 DIAGNOSIS — Z7401 Bed confinement status: Secondary | ICD-10-CM | POA: Diagnosis not present

## 2022-03-15 DIAGNOSIS — R338 Other retention of urine: Secondary | ICD-10-CM | POA: Diagnosis present

## 2022-03-15 DIAGNOSIS — E876 Hypokalemia: Secondary | ICD-10-CM | POA: Diagnosis present

## 2022-03-15 DIAGNOSIS — J9600 Acute respiratory failure, unspecified whether with hypoxia or hypercapnia: Secondary | ICD-10-CM | POA: Diagnosis not present

## 2022-03-15 DIAGNOSIS — N1831 Chronic kidney disease, stage 3a: Secondary | ICD-10-CM | POA: Diagnosis not present

## 2022-03-15 DIAGNOSIS — R34 Anuria and oliguria: Secondary | ICD-10-CM | POA: Diagnosis not present

## 2022-03-15 DIAGNOSIS — Z79899 Other long term (current) drug therapy: Secondary | ICD-10-CM | POA: Diagnosis not present

## 2022-03-15 DIAGNOSIS — I129 Hypertensive chronic kidney disease with stage 1 through stage 4 chronic kidney disease, or unspecified chronic kidney disease: Secondary | ICD-10-CM | POA: Diagnosis not present

## 2022-03-15 DIAGNOSIS — F419 Anxiety disorder, unspecified: Secondary | ICD-10-CM | POA: Diagnosis present

## 2022-03-15 DIAGNOSIS — J988 Other specified respiratory disorders: Secondary | ICD-10-CM

## 2022-03-15 DIAGNOSIS — E785 Hyperlipidemia, unspecified: Secondary | ICD-10-CM | POA: Diagnosis not present

## 2022-03-15 DIAGNOSIS — A419 Sepsis, unspecified organism: Secondary | ICD-10-CM | POA: Diagnosis not present

## 2022-03-15 DIAGNOSIS — R609 Edema, unspecified: Secondary | ICD-10-CM

## 2022-03-15 DIAGNOSIS — Z8249 Family history of ischemic heart disease and other diseases of the circulatory system: Secondary | ICD-10-CM | POA: Diagnosis not present

## 2022-03-15 DIAGNOSIS — D696 Thrombocytopenia, unspecified: Secondary | ICD-10-CM

## 2022-03-15 DIAGNOSIS — R1312 Dysphagia, oropharyngeal phase: Secondary | ICD-10-CM | POA: Diagnosis present

## 2022-03-15 DIAGNOSIS — N401 Enlarged prostate with lower urinary tract symptoms: Secondary | ICD-10-CM | POA: Diagnosis present

## 2022-03-15 DIAGNOSIS — M7989 Other specified soft tissue disorders: Secondary | ICD-10-CM | POA: Diagnosis not present

## 2022-03-15 DIAGNOSIS — D45 Polycythemia vera: Secondary | ICD-10-CM | POA: Diagnosis present

## 2022-03-15 DIAGNOSIS — R0902 Hypoxemia: Secondary | ICD-10-CM | POA: Diagnosis not present

## 2022-03-15 DIAGNOSIS — J189 Pneumonia, unspecified organism: Secondary | ICD-10-CM | POA: Diagnosis present

## 2022-03-15 DIAGNOSIS — R0602 Shortness of breath: Secondary | ICD-10-CM | POA: Diagnosis not present

## 2022-03-15 DIAGNOSIS — Z20822 Contact with and (suspected) exposure to covid-19: Secondary | ICD-10-CM | POA: Diagnosis present

## 2022-03-15 DIAGNOSIS — N179 Acute kidney failure, unspecified: Secondary | ICD-10-CM | POA: Diagnosis present

## 2022-03-15 DIAGNOSIS — I10 Essential (primary) hypertension: Secondary | ICD-10-CM | POA: Diagnosis not present

## 2022-03-15 DIAGNOSIS — M109 Gout, unspecified: Secondary | ICD-10-CM | POA: Diagnosis present

## 2022-03-15 DIAGNOSIS — D6959 Other secondary thrombocytopenia: Secondary | ICD-10-CM | POA: Diagnosis present

## 2022-03-15 DIAGNOSIS — N289 Disorder of kidney and ureter, unspecified: Secondary | ICD-10-CM

## 2022-03-15 DIAGNOSIS — J9601 Acute respiratory failure with hypoxia: Secondary | ICD-10-CM | POA: Diagnosis not present

## 2022-03-15 DIAGNOSIS — Z8616 Personal history of COVID-19: Secondary | ICD-10-CM

## 2022-03-15 DIAGNOSIS — I350 Nonrheumatic aortic (valve) stenosis: Secondary | ICD-10-CM | POA: Diagnosis present

## 2022-03-15 DIAGNOSIS — Z881 Allergy status to other antibiotic agents status: Secondary | ICD-10-CM

## 2022-03-15 DIAGNOSIS — U071 COVID-19: Secondary | ICD-10-CM | POA: Diagnosis not present

## 2022-03-15 DIAGNOSIS — F05 Delirium due to known physiological condition: Secondary | ICD-10-CM | POA: Diagnosis not present

## 2022-03-15 DIAGNOSIS — Z888 Allergy status to other drugs, medicaments and biological substances status: Secondary | ICD-10-CM

## 2022-03-15 DIAGNOSIS — F32A Depression, unspecified: Secondary | ICD-10-CM | POA: Diagnosis present

## 2022-03-15 DIAGNOSIS — R6521 Severe sepsis with septic shock: Secondary | ICD-10-CM | POA: Diagnosis not present

## 2022-03-15 DIAGNOSIS — J90 Pleural effusion, not elsewhere classified: Secondary | ICD-10-CM | POA: Diagnosis not present

## 2022-03-15 DIAGNOSIS — Z86718 Personal history of other venous thrombosis and embolism: Secondary | ICD-10-CM

## 2022-03-15 DIAGNOSIS — Z781 Physical restraint status: Secondary | ICD-10-CM

## 2022-03-15 DIAGNOSIS — J9811 Atelectasis: Secondary | ICD-10-CM | POA: Diagnosis not present

## 2022-03-15 DIAGNOSIS — N390 Urinary tract infection, site not specified: Secondary | ICD-10-CM | POA: Diagnosis not present

## 2022-03-15 LAB — RESP PANEL BY RT-PCR (FLU A&B, COVID) ARPGX2
Influenza A by PCR: NEGATIVE
Influenza B by PCR: NEGATIVE
SARS Coronavirus 2 by RT PCR: NEGATIVE

## 2022-03-15 LAB — CBC WITH DIFFERENTIAL/PLATELET
Abs Immature Granulocytes: 0.38 10*3/uL — ABNORMAL HIGH (ref 0.00–0.07)
Basophils Absolute: 0.7 10*3/uL — ABNORMAL HIGH (ref 0.0–0.1)
Basophils Relative: 3 %
Eosinophils Absolute: 0.1 10*3/uL (ref 0.0–0.5)
Eosinophils Relative: 0 %
HCT: 54.3 % — ABNORMAL HIGH (ref 39.0–52.0)
Hemoglobin: 14.9 g/dL (ref 13.0–17.0)
Immature Granulocytes: 2 %
Lymphocytes Relative: 5 %
Lymphs Abs: 1.2 10*3/uL (ref 0.7–4.0)
MCH: 24.3 pg — ABNORMAL LOW (ref 26.0–34.0)
MCHC: 27.4 g/dL — ABNORMAL LOW (ref 30.0–36.0)
MCV: 88.7 fL (ref 80.0–100.0)
Monocytes Absolute: 3 10*3/uL — ABNORMAL HIGH (ref 0.1–1.0)
Monocytes Relative: 13 %
Neutro Abs: 18.4 10*3/uL — ABNORMAL HIGH (ref 1.7–7.7)
Neutrophils Relative %: 77 %
Platelets: 20 10*3/uL — CL (ref 150–400)
RBC: 6.12 MIL/uL — ABNORMAL HIGH (ref 4.22–5.81)
RDW: 22.8 % — ABNORMAL HIGH (ref 11.5–15.5)
WBC: 23.8 10*3/uL — ABNORMAL HIGH (ref 4.0–10.5)
nRBC: 11 % — ABNORMAL HIGH (ref 0.0–0.2)

## 2022-03-15 LAB — BLOOD GAS, ARTERIAL
Acid-base deficit: 1.7 mmol/L (ref 0.0–2.0)
Acid-base deficit: 1.7 mmol/L (ref 0.0–2.0)
Bicarbonate: 22.1 mmol/L (ref 20.0–28.0)
Bicarbonate: 23 mmol/L (ref 20.0–28.0)
Drawn by: 54496
Drawn by: 54496
O2 Content: 88 L/min
O2 Content: 7 L/min
O2 Saturation: 94.5 %
O2 Saturation: 96.6 %
Patient temperature: 37
Patient temperature: 36.6
RATE: 95 resp/min
pCO2 arterial: 34 mmHg (ref 32–48)
pCO2 arterial: 37 mmHg (ref 32–48)
pH, Arterial: 7.42 (ref 7.35–7.45)
pH, Arterial: 7.4 (ref 7.35–7.45)
pO2, Arterial: 66 mmHg — ABNORMAL LOW (ref 83–108)
pO2, Arterial: 70 mmHg — ABNORMAL LOW (ref 83–108)

## 2022-03-15 LAB — COMPREHENSIVE METABOLIC PANEL
ALT: 14 U/L (ref 0–44)
AST: 21 U/L (ref 15–41)
Albumin: 3.6 g/dL (ref 3.5–5.0)
Alkaline Phosphatase: 73 U/L (ref 38–126)
Anion gap: 9 (ref 5–15)
BUN: 31 mg/dL — ABNORMAL HIGH (ref 8–23)
CO2: 25 mmol/L (ref 22–32)
Calcium: 9 mg/dL (ref 8.9–10.3)
Chloride: 108 mmol/L (ref 98–111)
Creatinine, Ser: 1.58 mg/dL — ABNORMAL HIGH (ref 0.61–1.24)
GFR, Estimated: 40 mL/min — ABNORMAL LOW (ref >60)
Glucose, Bld: 144 mg/dL — ABNORMAL HIGH (ref 70–99)
Potassium: 4.6 mmol/L (ref 3.5–5.1)
Sodium: 142 mmol/L (ref 135–145)
Total Bilirubin: 1.9 mg/dL — ABNORMAL HIGH (ref 0.3–1.2)
Total Protein: 5.9 g/dL — ABNORMAL LOW (ref 6.5–8.1)

## 2022-03-15 LAB — PROCALCITONIN: Procalcitonin: 1.24 ng/mL

## 2022-03-15 LAB — BRAIN NATRIURETIC PEPTIDE: B Natriuretic Peptide: 559.1 pg/mL — ABNORMAL HIGH (ref 0.0–100.0)

## 2022-03-15 LAB — GROUP A STREP BY PCR: Group A Strep by PCR: NOT DETECTED

## 2022-03-15 LAB — LACTIC ACID, PLASMA
Lactic Acid, Venous: 3.1 mmol/L (ref 0.5–1.9)
Lactic Acid, Venous: 2.4 mmol/L (ref 0.5–1.9)

## 2022-03-15 LAB — MRSA NEXT GEN BY PCR, NASAL: MRSA by PCR Next Gen: NOT DETECTED

## 2022-03-15 MED ORDER — LORAZEPAM 2 MG/ML IJ SOLN
0.5000 mg | Freq: Four times a day (QID) | INTRAMUSCULAR | Status: DC | PRN
Start: 2022-03-15 — End: 2022-03-19
  Administered 2022-03-15 – 2022-03-19 (×2): 0.5 mg via INTRAVENOUS
  Filled 2022-03-15 (×2): qty 1

## 2022-03-15 MED ORDER — SODIUM CHLORIDE 0.9 % IV SOLN
1.0000 g | INTRAVENOUS | Status: AC
  Administered 2022-03-15 – 2022-03-21 (×7): 1 g via INTRAVENOUS
  Filled 2022-03-15 (×7): qty 10

## 2022-03-15 MED ORDER — MIDODRINE HCL 5 MG PO TABS
10.0000 mg | ORAL_TABLET | ORAL | Status: AC
Start: 2022-03-15 — End: 2022-03-15
  Administered 2022-03-15: 10 mg via ORAL
  Filled 2022-03-15: qty 2

## 2022-03-15 MED ORDER — DOXYCYCLINE HYCLATE 100 MG PO TABS
100.0000 mg | ORAL_TABLET | Freq: Once | ORAL | Status: AC
  Administered 2022-03-15: 100 mg via ORAL
  Filled 2022-03-15: qty 1

## 2022-03-15 MED ORDER — PHENYLEPHRINE HCL-NACL 20-0.9 MG/250ML-% IV SOLN: 0.0000 ug/min | INTRAVENOUS | Status: DC

## 2022-03-15 MED ORDER — SODIUM CHLORIDE 0.9 % IV BOLUS
500.0000 mL | Freq: Once | INTRAVENOUS | Status: AC
  Administered 2022-03-15: 500 mL via INTRAVENOUS

## 2022-03-15 MED ORDER — ESCITALOPRAM OXALATE 5 MG PO TABS
5.0000 mg | ORAL_TABLET | Freq: Every day | ORAL | Status: DC
  Administered 2022-03-15 – 2022-03-22 (×8): 5 mg via ORAL
  Filled 2022-03-15 (×4): qty 1
  Filled 2022-03-15: qty 0.5
  Filled 2022-03-15: qty 1
  Filled 2022-03-15: qty 0.5
  Filled 2022-03-15 (×3): qty 1

## 2022-03-15 MED ORDER — SODIUM CHLORIDE (PF) 0.9 % IJ SOLN
INTRAMUSCULAR | Status: AC
  Filled 2022-03-15: qty 50

## 2022-03-15 MED ORDER — MIDODRINE HCL 5 MG PO TABS
10.0000 mg | ORAL_TABLET | Freq: Three times a day (TID) | ORAL | Status: AC
  Administered 2022-03-15 (×3): 10 mg via ORAL
  Filled 2022-03-15 (×3): qty 2

## 2022-03-15 MED ORDER — SODIUM CHLORIDE 0.9 % IV SOLN
250.0000 mL | INTRAVENOUS | Status: DC
  Administered 2022-03-15 – 2022-03-16 (×2): 250 mL via INTRAVENOUS

## 2022-03-15 MED ORDER — IPRATROPIUM-ALBUTEROL 0.5-2.5 (3) MG/3ML IN SOLN
3.0000 mL | Freq: Four times a day (QID) | RESPIRATORY_TRACT | Status: DC
  Administered 2022-03-15 – 2022-03-16 (×6): 3 mL via RESPIRATORY_TRACT
  Filled 2022-03-15 (×7): qty 3

## 2022-03-15 MED ORDER — SODIUM CHLORIDE 0.9 % IV SOLN
1.0000 g | Freq: Two times a day (BID) | INTRAVENOUS | Status: DC
  Filled 2022-03-15: qty 20

## 2022-03-15 MED ORDER — DEXAMETHASONE 4 MG PO TABS
10.0000 mg | ORAL_TABLET | Freq: Once | ORAL | Status: AC
  Administered 2022-03-15: 10 mg via ORAL
  Filled 2022-03-15: qty 1

## 2022-03-15 MED ORDER — PHENYLEPHRINE HCL-NACL 20-0.9 MG/250ML-% IV SOLN
25.0000 ug/min | INTRAVENOUS | Status: DC
  Administered 2022-03-15 – 2022-03-16 (×2): 25 ug/min via INTRAVENOUS
  Filled 2022-03-15 (×2): qty 250

## 2022-03-15 MED ORDER — DEXMEDETOMIDINE HCL IN NACL 200 MCG/50ML IV SOLN
0.1000 ug/kg/h | INTRAVENOUS | Status: DC
  Administered 2022-03-15: 0.1 ug/kg/h via INTRAVENOUS
  Administered 2022-03-16: 0.6 ug/kg/h via INTRAVENOUS
  Filled 2022-03-15 (×2): qty 50

## 2022-03-15 MED ORDER — MELATONIN 3 MG PO TABS
3.0000 mg | ORAL_TABLET | Freq: Every day | ORAL | Status: DC
  Filled 2022-03-15: qty 1

## 2022-03-15 MED ORDER — PHENOL 1.4 % MT LIQD
1.0000 | OROMUCOSAL | Status: DC | PRN
  Administered 2022-03-15: 1 via OROMUCOSAL
  Filled 2022-03-15: qty 177

## 2022-03-15 MED ORDER — ONDANSETRON 8 MG PO TBDP
8.0000 mg | ORAL_TABLET | Freq: Once | ORAL | Status: AC
  Administered 2022-03-15: 8 mg via ORAL
  Filled 2022-03-15: qty 1

## 2022-03-15 MED ORDER — CHLORHEXIDINE GLUCONATE CLOTH 2 % EX PADS
6.0000 | MEDICATED_PAD | Freq: Every day | CUTANEOUS | Status: DC
  Administered 2022-03-15 – 2022-03-21 (×6): 6 via TOPICAL

## 2022-03-15 MED ORDER — IOHEXOL 350 MG/ML SOLN
80.0000 mL | Freq: Once | INTRAVENOUS | Status: AC | PRN
  Administered 2022-03-15: 80 mL via INTRAVENOUS

## 2022-03-15 MED ORDER — IPRATROPIUM-ALBUTEROL 0.5-2.5 (3) MG/3ML IN SOLN
3.0000 mL | Freq: Once | RESPIRATORY_TRACT | Status: AC
  Administered 2022-03-15: 3 mL via RESPIRATORY_TRACT
  Filled 2022-03-15: qty 3

## 2022-03-15 MED ORDER — TERAZOSIN HCL 5 MG PO CAPS: 5.0000 mg | ORAL_CAPSULE | Freq: Every day | ORAL | Status: DC

## 2022-03-15 MED ORDER — AZITHROMYCIN 500 MG IV SOLR
500.0000 mg | Freq: Every day | INTRAVENOUS | Status: AC
  Administered 2022-03-15 – 2022-03-19 (×5): 500 mg via INTRAVENOUS
  Filled 2022-03-15 (×5): qty 5

## 2022-03-15 NOTE — Progress Notes (Signed)
[facility] Progress Note Patient Name: FRANCISO DIERKS DOB: 08-20-1923 MRN: [id number]   Date of Service  03/15/2022  HPI/Events of Note  ABG on [place] O2 = 7.4/37/70/23.  eICU Interventions  Continue present respiratory management.      Intervention Category Major Interventions: Other:  Starla Deller Cornelia Copa 03/15/2022, 9:22 PM

## 2022-03-15 NOTE — ED Notes (Signed)
Notified Francia Greaves DO in regards to pt's hypotension.

## 2022-03-15 NOTE — Evaluation (Signed)
Clinical/Bedside Swallow Evaluation Patient Details  Name: [name] [name] MRN: [id number] Date of Birth: 28-Jul-1923  Today's Date: 03/15/2022 Time: SLP Start Time (ACUTE ONLY): 70 SLP Stop Time (ACUTE ONLY): 5188 SLP Time Calculation (min) (ACUTE ONLY): 38 min  Past Medical History:  Past Medical History:  Diagnosis Date   Bone marrow disease    BPH (benign prostatic hypertrophy)    Depression    DJD (degenerative joint disease)    Dyslipidemia    Gout    HTN (hypertension)    Mild aortic stenosis    Polycythemia vera(238.4)    Past Surgical History:  Past Surgical History:  Procedure Laterality Date   ENDOVENOUS ABLATION SAPHENOUS VEIN W/ LASER Left 09/04/2016   endovenous laser ablation left greater saphenous vein by Tinnie Gens MD   HEMORROIDECTOMY     HPI:  88 yo male adm to [facility] with respiratory issues, he states he had copius vomiting PTA that preempted his respiratory issues..  Now reports some throat discomfort - and appears significantly red at posterior soft palate with secretion retention. PMH + for HTN, DJD, gout, depression, BPH, polysythemia vera, thrrombocytopenia r/o PE.  Imaging negative for PE showed streaky dense opacity in lungs, small right pleural effusion, coarse calcification of LUL, no pneumothorax.  Swallow eval ordered. Pt admits to some issues with swallowing with food more than liquids.  Endorses h/o emergent endoscopy and dilation of lower esophageal sphincter approx 20 years ago.  He denies weight loss, requiring heimlich manueer or recurrent pneumonias. Pt reports eating softer foods at home - due to poor dentition, denies pill dysphagia.    Assessment / Plan / Recommendation  Clinical Impression  Patient sitting upright in chair, friendly and interactive.  Wet voice and throat pain reported - he appears with erythema at posterior soft palate with secretion retention.  CN exam negative except for minimal lingual deviation to the right upon  protrusion.  (Pt leaning right also due to discomfort at sacrum).  Wet voice cleared as intake progressed.  Pt observed with water, applesauce, graham crackers - Inital swallows (water) followed by subtle cough - nonproductive, but this completely abated.  3 ounce [place] was not passed with 2 attempts due to pt respiratory demands preventing full consumption.  He was observed to have protective exhalation post-swallow.  No increased work of breathing or negative vital changes observed during intake.  Pt endores h/o requiring distal esophageal dilation of LES - emergent 20 years ago, denies symptoms recurring.  As pt reports primary issue PTA was vomiting prior to respiratory issues - suspect potential aspiration of emesis.  Reviewed general aspiration precautions with Mr Lori. Recommend continue diet as he tolerates.  No SLP follow up indicated as all education completed including universal sign of choking, liquids best tolerated (water), importance of adequate respiratory status for safe po.  Thanks for this consult. SLP Visit Diagnosis: Dysphagia, unspecified (R13.10)    Aspiration Risk  Mild aspiration risk    Diet Recommendation Regular;Thin liquid;Dysphagia 3 (Mech soft) (diet as pt tolerates)   Liquid Administration via: Cup;Straw Medication Administration: Via alternative means Supervision: Patient able to self feed Compensations: Slow rate;Small sips/bites Postural Changes: Seated upright at 90 degrees;Remain upright for at least 30 minutes after po intake    Other  Recommendations Oral Care Recommendations: Oral care BID    Recommendations for follow up therapy are one component of a multi-disciplinary discharge planning process, led by the attending physician.  Recommendations may be updated based on patient status,  additional functional criteria and insurance authorization.  Follow up Recommendations No SLP follow up      Assistance Recommended at Discharge None  Functional Status  Assessment Patient has not had a recent decline in their functional status  Frequency and Duration   N/a         Prognosis Prognosis for Safe Diet Advancement: Good      Swallow Study   General HPI: 88 yo male adm to [facility] with respiratory issues, he states he had copius vomiting PTA that preempted his respiratory issues..  Now reports some throat discomfort - and appears significantly red at posterior soft palate with secretion retention. PMH + for HTN, DJD, gout, depression, BPH, polysythemia vera, thrrombocytopenia r/o PE.  Imaging negative for PE showed streaky dense opacity in lungs, small right pleural effusion, coarse calcification of LUL, no pneumothorax.  Swallow eval ordered. Pt admits to some issues with swallowing with food more than liquids.  Endorses h/o emergent endoscopy and dilation of lower esophageal sphincter approx 20 years ago.  He denies weight loss, requiring heimlich manueer or recurrent pneumonias. Pt reports eating softer foods at home - due to poor dentition, denies pill dysphagia. Type of Study: Bedside Swallow Evaluation Previous Swallow Assessment: see HPI Diet Prior to this Study: Regular;Thin liquids Temperature Spikes Noted: No Respiratory Status: Nasal cannula (5 liters) History of Recent Intubation: No Behavior/Cognition: Alert;Cooperative;Pleasant mood;Other (Comment) (HOH) Oral Care Completed by SLP: No Oral Cavity - Dentition: Adequate natural dentition Vision: Functional for self-feeding Self-Feeding Abilities: Able to feed self Patient Positioning: Upright in chair Baseline Vocal Quality: Wet Volitional Cough: Weak Volitional Swallow: Unable to elicit    Oral/Motor/Sensory Function Overall Oral Motor/Sensory Function: Mild impairment Lingual ROM: Reduced right (slight deviation to the right upon protrusion)   Ice Chips Ice chips: Not tested   Thin Liquid Thin Liquid: Impaired Presentation: Cup;Straw;Self Fed Pharyngeal  Phase Impairments: Cough  - Immediate Other Comments: cough x1 of approx 8 boluses    Nectar Thick Nectar Thick Liquid: Not tested   Honey Thick Honey Thick Liquid: Not tested   Puree Puree: Within functional limits Presentation: Self Fed;Spoon   Solid     Solid: Impaired Presentation: Self Fed Oral Phase Impairments: Reduced lingual movement/coordination;Impaired mastication Oral Phase Functional Implications: Impaired mastication Pharyngeal Phase Impairments: Suspected delayed Swallow     Kathleen Lime, MS [facility] SLP Acute Rehab Services Office [phone] Pager [phone]  [name] 03/15/2022,1:42 PM

## 2022-03-15 NOTE — Progress Notes (Signed)
An USGPIV (ultrasound guided PIV) has been placed for short-term vasopressor infusion. A correctly placed ivWatch must be used when administering Vasopressors. Should this treatment be needed beyond 72 hours, central line access should be obtained.  It will be the responsibility of the bedside nurse to follow best practice to prevent extravasations.   ?

## 2022-03-15 NOTE — Progress Notes (Signed)
[facility] Progress Note Patient Name: [name] [name] DOB: [birth year] MRN: [id number]   Date of Service  03/15/2022  HPI/Events of Note  Nursing reports increasing confusion and O2 requirement - Patient attempting to get OOB repeatedly. Nursing request for Posey belt and ABG.  eICU Interventions  Plan: Soft waist belt restraint X 13 hours. ABG STAT.      Intervention Category Major Interventions: Hypoxemia - evaluation and management;Delirium, psychosis, severe agitation - evaluation and management  Haruki Arnold Eugene 03/15/2022, 8:14 PM

## 2022-03-15 NOTE — TOC Progression Note (Signed)
Transition of Care [facility]) - Progression Note    Patient Details  Name: [name] [name] MRN: [id number] Date of Birth: [birth year]  Transition of Care [facility]) CM/SW Contact  Purcell Mouton, RN Phone Number: 03/15/2022, 3:46 PM  Clinical Narrative:    TOC will continue to follow pt for discharge needs. PT was unable to eval pt today. Will wait for PT eval.    Expected Discharge Plan: [place] Barriers to Discharge: No Barriers Identified  Expected Discharge Plan and Services Expected Discharge Plan: [place]   Discharge Planning Services: CM Consult Post Acute Care Choice: [place], [place] Living arrangements for the past 2 months: Single Family Home                                       Social Determinants of Health (SDOH) Interventions    Readmission Risk Interventions     No data to display

## 2022-03-15 NOTE — ED Notes (Signed)
Called radiology for CT before pt goes up to inpatient unit, confirm they will scan pt at this time

## 2022-03-15 NOTE — Progress Notes (Signed)
Pharmacy Antibiotic Note  [name] [name] is a 88 y.o. male admitted on 03/15/2022 with sepsis likely from pneumonia.  Pharmacy has been consulted for Meropnem dosing.  PCN allergy noted.   Scr elevated (baseline ~ 1.3)  Plan: Meropenem 1gm IV q12h for CrCl <30 Monitor renal function and cx data   Height: '5\' 11"'$  (180.3 cm) IBW/kg (Calculated) : 75.3  Temp (24hrs), Avg:98.4 F (36.9 C), Min:98.2 F (36.8 C), Max:98.5 F (36.9 C)  Recent Labs  Lab 03/15/22 0236  WBC 23.8*  CREATININE 1.58*    Estimated Creatinine Clearance: 27.2 mL/min (A) (by C-G formula based on SCr of 1.58 mg/dL (H)).    Allergies  Allergen Reactions   Amoxicillin Swelling and Other (See Comments)    Reaction:  Hand swelling  Has patient had a PCN reaction causing immediate rash, facial/tongue/throat swelling, SOB or lightheadedness with hypotension: No Has patient had a PCN reaction causing severe rash involving mucus membranes or skin necrosis: Yes Has patient had a PCN reaction that required hospitalization No Has patient had a PCN reaction occurring within the last 10 years: No If all of the above answers are "NO", then may proceed with Cephalosporin use.   Colchicine Swelling and Other (See Comments)    Reaction:  Hand swelling     Antimicrobials this admission: 8/30 Meropenem >>  8/30 Doxycycline >>   Dose adjustments this admission:  Microbiology results:  Thank you for allowing pharmacy to be a part of this patient's care.  Netta Cedars PharmD 03/15/2022 7:02 AM

## 2022-03-15 NOTE — ED Notes (Signed)
RT called for arterial blood gas

## 2022-03-15 NOTE — ED Notes (Signed)
Attempted to call report back to ICU, however unable to get charge or another RN to receive incoming call at this time for report. Charge RN [place] aware

## 2022-03-15 NOTE — Progress Notes (Signed)
PT Cancellation Note  Patient Details Name: [name] [name] MRN: [id number] DOB: 12-16-1923   Cancelled Treatment:    Reason Eval/Treat Not Completed: Medical issues which prohibited therapy--low BP, low SpO2. Spoke with RN who is in agreement that PT should be held at this time. Will check back on tomorrow to attempt PT eval if pt is medically ready.    [facility]  Office: [phone] Pager: [phone]

## 2022-03-15 NOTE — ED Notes (Signed)
Receiving RN Kathe Becton. has agreed to accept [facility] once pt has arrived to inpatient unit, all questions and concerns address. Pt to go out on cardiac monitor device and 5L [place].

## 2022-03-15 NOTE — ED Notes (Signed)
RT reports unable to get arterial blood gas at this time, reports they will hand this off to the ICU RT so they may try once pt gets to inpatient bed

## 2022-03-15 NOTE — ED Provider Notes (Signed)
[facility] DEPT Provider Note   CSN: [id number] Arrival date & time: 03/15/22  3662     History  Chief Complaint  Patient presents with   Shortness of [place] is a 88 y.o. male.  The history is provided by the patient.  Shortness of Breath He has history of hypertension, hyperlipidemia, polycythemia vera, chronic thrombocytopenia, DVT not on anticoagulation because of thrombocytopenia and comes in because of sore throat and cough and shortness of breath over the last 2 days.  There has not been any fever or chills or sweats.  Cough is productive of yellow sputum.  Apparently, he had been exposed to COVID-19 2 weeks ago, no other known sick contacts.   Home Medications Prior to Admission medications   Medication Sig Start Date End Date Taking? Authorizing Provider  acetaminophen (TYLENOL) 500 MG tablet Take 650 mg by mouth every 6 (six) hours as needed.     [provider]  allopurinol (ZYLOPRIM) 100 MG tablet Take 100 mg by mouth 2 (two) times daily.     [provider]  cholecalciferol (VITAMIN D3) 25 MCG (1000 UNIT) tablet Take 1,000 Units by mouth daily.    [provider]  escitalopram (LEXAPRO) 10 MG tablet Take 5 mg by mouth at bedtime.     [provider]  glucosamine-chondroitin 500-400 MG tablet Take 1 tablet by mouth daily.    [provider]  Multiple Vitamin (MULTIVITAMIN WITH MINERALS) TABS tablet Take 1 tablet by mouth daily.    [provider]  omega-3 acid ethyl esters (LOVAZA) 1 g capsule Take 1 g by mouth daily.    [provider]  terazosin (HYTRIN) 5 MG capsule Take 5 mg by mouth at bedtime.    [provider]  vitamin E (VITAMIN E) 400 UNIT capsule Take 400 Units by mouth daily.    [provider]      Allergies    Amoxicillin and Colchicine    Review of Systems   Review of Systems  Respiratory:  Positive for shortness of  breath.   All other systems reviewed and are negative.   Physical Exam Updated Vital Signs BP (!) 147/82   Pulse 85   Temp 98.2 F (36.8 C) (Oral)   Resp 15   SpO2 95%  Physical Exam Vitals and nursing note reviewed.   88 year old male, resting comfortably and in no acute distress. Vital signs are significant for mildly elevated blood pressure. Oxygen saturation is 95%, which is normal. Head is normocephalic and atraumatic. PERRLA, EOMI. Oropharynx is moderately erythematous with mild edema of the uvula.  There is no pooling of secretions and phonation is normal. Neck is nontender and supple without adenopathy or JVD. Back is nontender and there is no CVA tenderness. Lungs have diminished breath sounds diffusely without rales, wheezes, or rhonchi. Chest is nontender. Heart has regular rate and rhythm with 2/6 high-pitched systolic ejection murmur consistent with aortic stenosis. Abdomen is soft, flat, nontender. Extremities have no cyanosis or edema, full range of motion is present. Skin is warm and dry without rash. Neurologic: Mental status is normal, cranial nerves are intact, moves all extremities equally.  ED Results / Procedures / Treatments   Labs (all labs ordered are listed, but only abnormal results are displayed) Labs Reviewed  COMPREHENSIVE METABOLIC PANEL - Abnormal; Notable for the following components:      Result Value   Glucose, Bld 144 (*)  BUN 31 (*)    Creatinine, Ser 1.58 (*)    Total Protein 5.9 (*)    Total Bilirubin 1.9 (*)    GFR, Estimated 40 (*)    All other components within normal limits  CBC WITH DIFFERENTIAL/PLATELET - Abnormal; Notable for the following components:   WBC 23.8 (*)    RBC 6.12 (*)    HCT 54.3 (*)    MCH 24.3 (*)    MCHC 27.4 (*)    RDW 22.8 (*)    Platelets 20 (*)    nRBC 11.0 (*)    Neutro Abs 18.4 (*)    Monocytes Absolute 3.0 (*)    Basophils Absolute 0.7 (*)    Abs Immature Granulocytes 0.38 (*)    All other  components within normal limits  RESP PANEL BY RT-PCR (FLU A&B, COVID) [id number]    EKG EKG Interpretation  Date/Time:  Wednesday March 15 2022 02:33:37 EDT Ventricular Rate:  90 PR Interval:  256 QRS Duration: 108 QT Interval:  354 QTC Calculation: 434 R Axis:   -3 Text Interpretation: Sinus or ectopic atrial rhythm Ventricular premature complex Prolonged PR interval Borderline repolarization abnormality When compared with ECG of 10/19/2016, Premature ventricular complexes are now present Confirmed by Delora Fuel ([id number]) on 03/15/2022 2:47:05 AM  Radiology DG Chest Port 1 View  Result Date: 03/15/2022 CLINICAL DATA:  Shortness of breath. EXAM: PORTABLE CHEST 1 VIEW COMPARISON:  10/19/2016. FINDINGS: The heart size and mediastinal contours are within normal limits. There is atherosclerotic calcification of the aorta. Stable calcified granuloma is present in the left upper lobe. Lung volumes are low with atelectasis at the lung bases. No effusion or pneumothorax. No acute osseous abnormality. IMPRESSION: Low lung volumes with mild atelectasis at the lung bases. Electronically Signed   By: Brett Fairy M.D.   On: 03/15/2022 02:51    Procedures Procedures  Cardiac monitor shows normal sinus rhythm with occasional PVC, per my interpretation.  Medications Ordered in ED Medications - No data to display  ED Course/ Medical Decision Making/ A&P                           Medical Decision Making Amount and/or Complexity of Data Reviewed Labs: ordered. Radiology: ordered.  Risk Prescription drug management.   Respiratory tract infection, most likely viral.  Consider influenza, COVID-19, streptococcal infection, other viral infection, community-acquired pneumonia.  Chest x-ray shows low lung volumes with atelectasis in the bases but no evidence of pneumonia.  I have independently viewed the image, and agree with the radiologist's interpretation.  I have reviewed and interpreted his ECG, and  my interpretation is borderline repolarization abnormality and occasional PVCs, unchanged from prior.  I have reviewed and interpreted his laboratory test, and my interpretation is renal insufficiency slightly worse than baseline, chronic elevation of total bilirubin, chronic leukocytosis which is slightly increased compared with recent, severe thrombocytopenia which is at his baseline, normal hemoglobin.  Respiratory pathogen panel shows no evidence of influenza or COVID infection.  I have ordered a strep PCR.  Following obtaining swab for strep PCR, patient vomited several times, I have ordered a dose of ondansetron oral dissolving tablet.  I have also ordered a nebulizer treatment with albuterol and ipratropium to see if he gets symptomatic relief with this. Old records reviewed, and office visit on 03/01/2022 shows outpatient management of polycythemia vera and thrombocytopenia with avoiding major interventions based on patient's performance status and age.  Strep  PCR is negative.  He does feel better following albuterol with ipratropium.  However, he has developed a new oxygen requirement.  He is requiring oxygen at 6 L/min by nasal cannula to maintain oxygen saturation at 92%.  Within 2 minutes of coming off of oxygen, oxygen saturation drops into the 80s.  I had initially ordered a dose of doxycycline and dexamethasone, anticipating discharge.  He will need to be admitted for oxygen therapy and may need to have arrangements made for appropriate home health services and possible stay in a skilled nursing facility.  Case is discussed with Dr. Nevada Crane of [facility], who agrees to admit the patient.  Final Clinical Impression(s) / ED Diagnoses Final diagnoses:  Acute respiratory failure with hypoxia ([place])  Respiratory tract infection  Thrombocytopenia (H[facility])  Renal insufficiency    Rx / DC Orders ED Discharge Orders     None         Delora Fuel, MD 54/09/81 [phone]

## 2022-03-15 NOTE — ED Notes (Signed)
Attempted to call report to [phone], however advised to call back in 5-10 mins as charge RN for inpatient ICU is trying to confirm staffing and receiving inpatient RN

## 2022-03-15 NOTE — Progress Notes (Signed)
[facility] Progress Note Patient Name: [name] [name] DOB: July 13, 1924 MRN: [id number]   Date of Service  03/15/2022  HPI/Events of Note  Patient with continued restless and confusion. ABG pending. Patient's son states that patient is experiencing confusion and agitation nightly at home. Clinic picture c/w Sund[facility]. ABG pending.   eICU Interventions  Plan: Low dose Precedex IV infusion (0.1 to 0.4 mcg/kg/min). Titrate to RASS = 0.      Intervention Category Major Interventions: Delirium, psychosis, severe agitation - evaluation and management  [name] [name] 03/15/2022, 9:00 PM

## 2022-03-15 NOTE — ED Triage Notes (Addendum)
Pt BIB [facility] from home for SOB for 2 hours. Exposed to [place] about 2 weeks ago. Rhonchi in L lung. Denies cough, congestion, fever or chills. 97.72F en route. 94% on arrival. Denies respiratory hx. No one else at home is sick.  20g L arm.

## 2022-03-15 NOTE — Progress Notes (Signed)
Lower extremity venous has been completed.   Preliminary results in CV Proc.   Jinny Blossom Ethleen Lormand 03/15/2022 8:34 AM

## 2022-03-15 NOTE — ED Notes (Signed)
Pt's SPO2 on 3 L/M [place] showed 84%, titrated to 5 L/M, pt now reading 91%

## 2022-03-15 NOTE — Progress Notes (Signed)
IVT automatic consult for vasopressors.  Arrived to pt's room and spoke with primary RN, Judson Roch.  Pt is currently not receiving pressors, but aware to consult IV Team for US guided IV if pressors are started.

## 2022-03-15 NOTE — H&P (Addendum)
History and Physical  [name] [name] RJJ:[id number] DOB: 08/15/1923 DOA: 03/15/2022  Referring physician: Dr. Roxanne Mins, [place]  PCP: [name], MD  Outpatient Specialists: Hematology oncology Patient coming from: Home  Chief Complaint: Shortness of breath  HPI: [name] [name] is a 88 y.o. male with medical history significant for polycythemia vera, chronic thrombocytopenia, DVT not on anticoagulation due to thrombocytopenia, BPH, gout, chronic anxiety/depression who presented to [facility] ED from home due to sudden onset shortness of breath and hypoxia.  Has 24-hour caregiver.  Per the caregiver in the room he was in his usual state of health this morning.  Report of exposure to COVID about 2 weeks ago.  COVID-19 screening test negative.  Productive cough noted on exam.  Not on O2 supplementation at baseline and currently requiring 5 L to maintain O2 saturation above 88%.  Work-up in the ED reveals sepsis suspect secondary to pneumonia.  Blood cultures ordered.  Started on empiric IV Antibiotics.  The patient was admitted by the hospitalist service, [facility].  PCCM has been consulted to assist with the management.  ED Course: Tmax 98.5.  BP 88/63, pulse 103, respiration rate 21, O2 saturation 88% on 5 L.  Lab studies remarkable for WBC 23 K, hemoglobin 14 K, platelet count 20 K.  Creatinine 1.58.  GFR 40.  Glucose 144.  Total bilirubin 1.9.  Review of Systems: Review of systems as noted in the HPI. All other systems reviewed and are negative.   Past Medical History:  Diagnosis Date   Bone marrow disease    BPH (benign prostatic hypertrophy)    Depression    DJD (degenerative joint disease)    Dyslipidemia    Gout    HTN (hypertension)    Mild aortic stenosis    Polycythemia vera(238.4)    Past Surgical History:  Procedure Laterality Date   ENDOVENOUS ABLATION SAPHENOUS VEIN W/ LASER Left 09/04/2016   endovenous laser ablation left greater saphenous vein by Tinnie Gens MD   HEMORROIDECTOMY       Social History:  reports that he has never smoked. He has never used smokeless tobacco. He reports current alcohol use of about 7.0 standard drinks of alcohol per week. He reports that he does not use drugs.   Allergies  Allergen Reactions   Amoxicillin Swelling and Other (See Comments)    Reaction:  Hand swelling  Has patient had a PCN reaction causing immediate rash, facial/tongue/throat swelling, SOB or lightheadedness with hypotension: No Has patient had a PCN reaction causing severe rash involving mucus membranes or skin necrosis: Yes Has patient had a PCN reaction that required hospitalization No Has patient had a PCN reaction occurring within the last 10 years: No If all of the above answers are "NO", then may proceed with Cephalosporin use.   Colchicine Swelling and Other (See Comments)    Reaction:  Hand swelling     Family History  Problem Relation Age of Onset   Hypertension Mother       Prior to Admission medications   Medication Sig Start Date End Date Taking? Authorizing Provider  acetaminophen (TYLENOL) 500 MG tablet Take 650 mg by mouth every 6 (six) hours as needed.     [provider]  allopurinol (ZYLOPRIM) 100 MG tablet Take 100 mg by mouth 2 (two) times daily.     [provider]  cholecalciferol (VITAMIN D3) 25 MCG (1000 UNIT) tablet Take 1,000 Units by mouth daily.    [provider]  escitalopram (LEXAPRO) 10  MG tablet Take 5 mg by mouth at bedtime.     [provider]  glucosamine-chondroitin 500-400 MG tablet Take 1 tablet by mouth daily.    [provider]  Multiple Vitamin (MULTIVITAMIN WITH MINERALS) TABS tablet Take 1 tablet by mouth daily.    [provider]  omega-3 acid ethyl esters (LOVAZA) 1 g capsule Take 1 g by mouth daily.    [provider]  terazosin (HYTRIN) 5 MG capsule Take 5 mg by mouth at bedtime.    [provider]  vitamin E (VITAMIN E) 400 UNIT capsule Take  400 Units by mouth daily.    [provider]    Physical Exam: BP (!) 88/61 (BP Location: Right Arm)   Pulse (!) 104   Temp 98.2 F (36.8 C) (Oral)   Resp 17   Ht '5\' 11"'$  (1.803 m)   SpO2 90%   BMI 21.16 kg/m   General: 88 y.o. year-old male well developed well nourished in no acute distress.  Lethargic and minimally interactive. Cardiovascular: Tachycardic with no rubs or gallops.  No thyromegaly or JVD noted.  Trace lower extremity edema bilaterally. Respiratory: Clear to auscultation with no wheezes or rales. Good inspiratory effort. Abdomen: Soft nontender nondistended with normal bowel sounds x4 quadrants. Muskuloskeletal: No cyanosis or clubbing.  Trace lower extremity edema bilaterally. Neuro: CN II-XII intact, strength, sensation, reflexes Skin: No ulcerative lesions noted or rashes.  Hyperpigmentation in lower extremities likely from chronic venous stasis. Psychiatry: Judgement and insight appear normal. Mood is appropriate for condition and setting          Labs on Admission:  Basic Metabolic Panel: Recent Labs  Lab 03/15/22 0236  NA 142  K 4.6  CL 108  CO2 25  GLUCOSE 144*  BUN 31*  CREATININE 1.58*  CALCIUM 9.0   Liver Function Tests: Recent Labs  Lab 03/15/22 0236  AST 21  ALT 14  ALKPHOS 73  BILITOT 1.9*  PROT 5.9*  ALBUMIN 3.6   No results for input(s): "LIPASE", "AMYLASE" in the last 168 hours. No results for input(s): "AMMONIA" in the last 168 hours. CBC: Recent Labs  Lab 03/15/22 0236  WBC 23.8*  NEUTROABS 18.4*  HGB 14.9  HCT 54.3*  MCV 88.7  PLT 20*   Cardiac Enzymes: No results for input(s): "CKTOTAL", "CKMB", "CKMBINDEX", "TROPONINI" in the last 168 hours.  BNP (last 3 results) No results for input(s): "BNP" in the last 8760 hours.  ProBNP (last 3 results) No results for input(s): "PROBNP" in the last 8760 hours.  CBG: No results for input(s): "GLUCAP" in the last 168 hours.  Radiological Exams on Admission: DG  Chest Port 1 View  Result Date: 03/15/2022 CLINICAL DATA:  Shortness of breath. EXAM: PORTABLE CHEST 1 VIEW COMPARISON:  10/19/2016. FINDINGS: The heart size and mediastinal contours are within normal limits. There is atherosclerotic calcification of the aorta. Stable calcified granuloma is present in the left upper lobe. Lung volumes are low with atelectasis at the lung bases. No effusion or pneumothorax. No acute osseous abnormality. IMPRESSION: Low lung volumes with mild atelectasis at the lung bases. Electronically Signed   By: Brett Fairy M.D.   On: 03/15/2022 02:51    EKG: I independently viewed the EKG done and my findings are as followed: Sinus or ectopic atrial rhythm rate of 90.  Nonspecific ST-T changes.  QTc 434.  Assessment/Plan Present on Admission:  Acute respiratory failure with hypoxia (HCC)  Principal Problem:   Acute respiratory  failure with hypoxia (HCC)  Acute hypoxic respiratory failure suspect secondary to community-acquired pneumonia, POA Not on oxygen supplementation at baseline Currently requiring 5 L to maintain O2 saturation greater than 88% Stat ABG ordered Chest x-ray equivocal, will obtain CTA to rule out PE versus parenchymal pulmonary disease.  Sepsis secondary to suspected community-acquired pneumonia WBC 23,000, pulse 104, respiration rate 27 Stat Procalcitonin and lactic acid ordered Blood cultures x2 peripherally MRSA screening test  Meropenem empirically  Monitor fever curve and WBC  Hypotension, likely related to sepsis BNP to rule out acute CHF Midodrine 10 mg 3 times daily x3 doses Closely monitor vital signs Maintain MAP greater than 65  AKI on CKD 3A Baseline creatinine appears to be 1.2 with GFR 55 Presented with creatinine of 1.58 with GFR 40 Closely monitor urine output with strict I's and O's Avoid nephrotoxic agents as able Avoid hypotension as able  Polycythemia vera Chronic thrombocytopenia History of DVT not anticoagulated  due to thrombocytopenia Follows with Dr. Burr Medico, medical oncology, added to patient care team. Platelet count 20,000, closely monitor. Obtain bilateral lower extremity Doppler ultrasound   Critical care time: 65 minutes   DVT prophylaxis:  Rule out DVT prior to SCDs.  Code Status: Full code as stated by caregiver at bedside.  Family Communication: Caregiver at bedside.  Disposition Plan: Admitted to stepdown unit  Consults called: PCCM  Admission status: Inpatient status   Status is: Inpatient The patient requires at least 2 midnights for further evaluation and treatment of present condition.   Kayleen Memos MD [facility] Pager [phone]  If 7PM-7AM, please contact night-coverage [email or web address] Password [facility]  03/15/2022, 5:49 AM

## 2022-03-15 NOTE — Consult Note (Signed)
NAME:  [name] [name], MRN:  [id number], DOB:  12/30/1923, LOS: 0 ADMISSION DATE:  03/15/2022, CONSULTATION DATE: 03/15/2022 REFERRING MD: Dr. Nevada Crane, CHIEF COMPLAINT: Shortness of breath  History of Present Illness:  Elderly gentleman who presented with a day history of worsening shortness of breath Was in his usual state the day prior to presentation He had called his caregiver in the early hours of the morning about shortness of breath Some cough, some chest discomfort Usually able to get around easily Was exposed to somebody with COVID 2 weeks ago but tested negative Noted to be hypoxemic on initial evaluation with elevated WBC count  Not known to have any problems swallowing, no history of aspirations No recent hospitalizations  Pertinent  Medical History   Past Medical History:  Diagnosis Date   Bone marrow disease    BPH (benign prostatic hypertrophy)    Depression    DJD (degenerative joint disease)    Dyslipidemia    Gout    HTN (hypertension)    Mild aortic stenosis    Polycythemia vera(238.4)    Significant Hospital Events: Including procedures, antibiotic start and stop dates in addition to other pertinent events   PCCM consult 03/15/2022  Interim History / Subjective:  Awake alert and interactive Some chest discomfort, breathing not at baseline  Objective   Blood pressure 138/60, pulse (!) 103, temperature 98.4 F (36.9 C), temperature source Oral, resp. rate (!) 25, height '5\' 11"'$  (1.803 m), weight 67.9 kg, SpO2 90 %.       No intake or output data in the 24 hours ending 03/15/22 0904 Filed Weights   03/15/22 0705  Weight: 67.9 kg    Examination: General: Elderly gentleman, frail HENT: Moist oral mucosa Lungs: Decreased air entry anteriorly, few rales at the bases Cardiovascular: S1-S2 appreciated Abdomen: Bowel sounds appreciated Extremities: No clubbing, no edema, chronic venous changes Neuro: Alert and oriented x3 GU:   CT scan reviewed by  myself showing atelectasis, infiltrative process at the bases of the lungs, airway debris in the lower lobes  Lower extremity ultrasound shows no evidence of DVT  Resolved Hospital Problem list     Assessment & Plan:  Marland Kitchen  Acute hypoxemic respiratory failure -Continue oxygen supplementation -Likely related to infiltrative process in the lungs  .  Community-acquired pneumonia .  Sepsis related to community-acquired pneumonia -Elevated white count, elevated pulse rate, elevated respiratory rate, elevated lactate -Continue antibiotics, -As he had not had recent hospitalization or exposure to antibiotics -We will tailor antibiotics to community-acquired pneumonia -Discontinue meropenem -We will switch to ceftriaxone and azithromycin  .  Hypotension -On midodrine -Monitor vitals  Acute kidney injury on chronic kidney disease stage IIIa -GFR 40 -Avoid nephrotoxic agents -Maintain renal perfusion  .  History of polycythemia vera .  Chronic thrombocytopenia -Follows up with Dr. Burr Medico -Monitor platelet counts -No evidence of bleeding  Best Practice (right click and "Reselect all SmartList Selections" daily)   Diet/type: Regular consistency (see orders) DVT prophylaxis: SCD GI prophylaxis: N/A Lines: N/A Foley:  N/A Code Status:  full code Last date of multidisciplinary goals of care discussion [Per primary]  Labs   CBC: Recent Labs  Lab 03/15/22 0236  WBC 23.8*  NEUTROABS 18.4*  HGB 14.9  HCT 54.3*  MCV 88.7  PLT 20*    Basic Metabolic Panel: Recent Labs  Lab 03/15/22 0236  NA 142  K 4.6  CL 108  CO2 25  GLUCOSE 144*  BUN 31*  CREATININE 1.58*  CALCIUM 9.0   GFR: Estimated Creatinine Clearance: 26.9 mL/min (A) (by C-G formula based on SCr of 1.58 mg/dL (H)). Recent Labs  Lab 03/15/22 0236 03/15/22 0719  PROCALCITON  --  1.24  WBC 23.8*  --   LATICACIDVEN  --  3.1*    Liver Function Tests: Recent Labs  Lab 03/15/22 0236  AST 21  ALT 14   ALKPHOS 73  BILITOT 1.9*  PROT 5.9*  ALBUMIN 3.6   No results for input(s): "LIPASE", "AMYLASE" in the last 168 hours. No results for input(s): "AMMONIA" in the last 168 hours.  ABG    Component Value Date/Time   PHART 7.42 03/15/2022 0625   PCO2ART 34 03/15/2022 0625   PO2ART 66 (L) 03/15/2022 0625   HCO3 22.1 03/15/2022 0625   ACIDBASEDEF 1.7 03/15/2022 0625   O2SAT 94.5 03/15/2022 0625     Coagulation Profile: No results for input(s): "INR", "PROTIME" in the last 168 hours.  Cardiac Enzymes: No results for input(s): "CKTOTAL", "CKMB", "CKMBINDEX", "TROPONINI" in the last 168 hours.  HbA1C: No results found for: "HGBA1C"  CBG: No results for input(s): "GLUCAP" in the last 168 hours.  Review of Systems:   Awake and alert, requiring oxygen supplementation Denies chest pain  Past Medical History:  He,  has a past medical history of Bone marrow disease, BPH (benign prostatic hypertrophy), Depression, DJD (degenerative joint disease), Dyslipidemia, Gout, HTN (hypertension), Mild aortic stenosis, and Polycythemia vera(238.4).   Surgical History:   Past Surgical History:  Procedure Laterality Date   ENDOVENOUS ABLATION SAPHENOUS VEIN W/ LASER Left 09/04/2016   endovenous laser ablation left greater saphenous vein by Tinnie Gens MD   HEMORROIDECTOMY       Social History:   reports that he has never smoked. He has never used smokeless tobacco. He reports current alcohol use of about 7.0 standard drinks of alcohol per week. He reports that he does not use drugs.   Family History:  His family history includes Hypertension in his mother.   Allergies Allergies  Allergen Reactions   Amoxicillin Swelling and Other (See Comments)    Reaction:  Hand swelling  Has patient had a PCN reaction causing immediate rash, facial/tongue/throat swelling, SOB or lightheadedness with hypotension: No Has patient had a PCN reaction causing severe rash involving mucus membranes or skin  necrosis: Yes Has patient had a PCN reaction that required hospitalization No Has patient had a PCN reaction occurring within the last 10 years: No If all of the above answers are "NO", then may proceed with Cephalosporin use.   Colchicine Swelling and Other (See Comments)    Reaction:  Hand swelling      Home Medications  Prior to Admission medications   Medication Sig Start Date End Date Taking? Authorizing Provider  acetaminophen (TYLENOL) 500 MG tablet Take 650 mg by mouth every 6 (six) hours as needed.     [provider]  allopurinol (ZYLOPRIM) 100 MG tablet Take 100 mg by mouth 2 (two) times daily.     [provider]  cholecalciferol (VITAMIN D3) 25 MCG (1000 UNIT) tablet Take 1,000 Units by mouth daily.    [provider]  escitalopram (LEXAPRO) 10 MG tablet Take 5 mg by mouth at bedtime.     [provider]  glucosamine-chondroitin 500-400 MG tablet Take 1 tablet by mouth daily.    [provider]  Multiple Vitamin (MULTIVITAMIN WITH MINERALS) TABS tablet Take 1 tablet by mouth daily.    [provider]  omega-3 acid ethyl esters (LOVAZA) 1 g capsule Take 1 g by mouth daily.    [provider]  terazosin (HYTRIN) 5 MG capsule Take 5 mg by mouth at bedtime.    [provider]  vitamin E (VITAMIN E) 400 UNIT capsule Take 400 Units by mouth daily.    [provider]    The patient is critically ill with multiple organ systems failure and requires high complexity decision making for assessment and support, frequent evaluation and titration of therapies, application of advanced monitoring technologies and extensive interpretation of multiple databases. Critical Care Time devoted to patient care services described in this note independent of APP/resident time (if applicable)  is 31 minutes.   Sherrilyn Rist MD [facility] Personal pager: See Amion If unanswered, please page CCM  On-call: [phone]

## 2022-03-15 NOTE — Progress Notes (Signed)
PROGRESS NOTE    ARINZE RIVADENEIRA  CLE:[id number] DOB: 12/01/1923 DOA: 03/15/2022 PCP: [name], MD    Brief Narrative:   [name] [name] is a 88 y.o. male [employer] fast streamer, retired Therapist, music engineer/college professor with past medical history significant for polycythemia vera, chronic thrombocytopenia, DVT not on anticoagulation due to low platelet count, BPH, gout, anxiety/depression who presented to [facility] ED on 8/30 via EMS for acute onset shortness of breath.  Patient accompanied by one of his 24-hour caregivers.  He has been in his usual state of health but has had exposure to COVID about 2 weeks ago.  Patient also with productive cough.  Denies any other sick contacts, no changes in his home medications.  In the ED, temperature 98.2 F, HR 115, RR 27, BP 88/61, SPO2 90% on 5 L nasal cannula.  WBC 23.8, hemoglobin 14.9, platelets 20.  Sodium 142, potassium 4.6, chloride 108, CO2 25, glucose 144, BUN 31, creatinine 1.58.  Total bilirubin 1.9.  Lactic acid 2.4.  Procalcitonin 1.24.  BNP 559.1.  COVID-19 PCR negative.  Influenza A/B PCR negative.  Chest x-ray with low lung volumes with mild atelectasis lung bases.  CT angiogram chest with extensive airway filling in the lower lungs with bilateral atelectasis and pneumonia, negative for pulmonary embolism.  TRH consulted for further evaluation management of acute hypoxic respite failure secondary to community-acquired pneumonia with associated hypotension.  Assessment & Plan:   Acute respiratory failure with hypoxia, POA Sepsis secondary to community-acquired pneumonia POA Patient presenting to the ED with acute onset shortness of breath.  Noted to have elevated WBC count of 23.8, procalcitonin of 1.24 with a lactic acid of 2.4.  COVID-19 PCR/influenza A/B PCR negative.  CT angiogram chest negative for PE but with extensive airway filling lower lobes consistent with pneumonia. --SLP evaluation to assess aspiration risk --Blood cultures x  2: pending --Sputum culture: Ordered --Azithromycin '500mg'$  IV daily --Ceftriaxone 1g IV q24h --Continue supplemental oxygen, maintain SPO2 greater than 92%, on 7 L nasal cannula (not oxygen dependent at baseline)  Hypotension Etiology likely secondary to infectious process as above; responsive to IV fluid bolus.  Not on antihypertensives outpatient --PCCM following, appreciate assistance-- Continue intermittent IV fluid boluses --If MAP below 65, will consider low-dose peripheral Neo-Synephrine  Polycythemia vera Follows with hematology outpatient, Dr. Burr Medico.  Was previously on Hydrea and underwent intermittent phlebotomies but which are now on hold.  Continue outpatient follow-up with hematology.  Thrombocytopenia, chronic Platelet count of 20, stable.  No active bleeding.  Follows with hematology outpatient. --Platelet transfusion for platelet count less than 10 or active bleeding --CBC daily  Anxiety/depression --Lexapro 5 mg p.o. nightly  BPH: Hold home Hytrin for now  Gout: Hold home allopurinol for now  Weakness/deconditioning/debility: Patient currently lives in a home residence, has 24-hour caregivers and mainly lives on the first floor. --PT/OT evaluation   DVT prophylaxis: Place and maintain sequential compression device Start: 03/15/22 1111    Code Status: Full Code Family Communication: No family present at bedside this morning  Disposition Plan:  Level of care: Stepdown Status is: Inpatient Remains inpatient appropriate because: Hypotensive, IV antibiotics, requiring higher levels of supplemental oxygen,    Consultants:  PCCM  Procedures:  Vascular duplex ultrasound bilateral lower extremities  Antimicrobials:  Meropenem 8/30>> Azithromycin 8/30>> Ceftriaxone 8/30>>   Subjective: Patient seen examined bedside, resting comfortably.  Continues with dyspnea but overall feels improved since initial presentation.  Remains on 7 L nasal cannula with SPO2 90%  at rest.  Patient is concerned about becoming more weak and would like to work with physical therapy today.  Discussed with RN at bedside concern for continued hypotension and will receive another IV fluid bolus.  Discussed with PCCM this morning as well and okay to start peripheral Neo-Synephrine if needed.  Patient with no other questions or concerns at this time.  Denies headache, no dizziness, no chest pain, no palpitations, no abdominal pain, no current fever/chills/night sweats, no nausea/vomiting/diarrhea, no focal weakness, no fatigue, no paresthesias.  No other acute concerns this morning per nursing staff.  Objective: Vitals:   03/15/22 1200 03/15/22 1215 03/15/22 1230 03/15/22 1245  BP: (!) 102/59 (!) 117/52 103/63 114/71  Pulse: 83 87 90 94  Resp: 18 18 (!) 21 (!) 22  Temp: 99.2 F (37.3 C)     TempSrc: Oral     SpO2: (!) 86% (!) 89% 90% (!) 87%  Weight:      Height:       No intake or output data in the 24 hours ending 03/15/22 1251 Filed Weights   03/15/22 0705  Weight: 67.9 kg    Examination:  Physical Exam: GEN: NAD, alert and oriented x 3, elderly/ill/weak in appearance HEENT: NCAT, PERRL, EOMI, sclera clear, MMM PULM: Sounds diminished bilateral bases with crackles, normal respiratory effort without accessory muscle use, no wheezing, on 7 L nasal cannula with SPO2 92% at rest CV: RRR w/o M/G/R GI: abd soft, NTND, NABS, no R/G/M MSK: no peripheral edema, muscle strength globally intact 5/5 bilateral upper/lower extremities NEURO: CN II-XII intact, no focal deficits, sensation to light touch intact PSYCH: normal mood/affect Integumentary: dry/intact, no rashes or wounds    Data Reviewed: I have personally reviewed following labs and imaging studies  CBC: Recent Labs  Lab 03/15/22 0236  WBC 23.8*  NEUTROABS 18.4*  HGB 14.9  HCT 54.3*  MCV 88.7  PLT 20*   Basic Metabolic Panel: Recent Labs  Lab 03/15/22 0236  NA 142  K 4.6  CL 108  CO2 25   GLUCOSE 144*  BUN 31*  CREATININE 1.58*  CALCIUM 9.0   GFR: Estimated Creatinine Clearance: 26.9 mL/min (A) (by C-G formula based on SCr of 1.58 mg/dL (H)). Liver Function Tests: Recent Labs  Lab 03/15/22 0236  AST 21  ALT 14  ALKPHOS 73  BILITOT 1.9*  PROT 5.9*  ALBUMIN 3.6   No results for input(s): "LIPASE", "AMYLASE" in the last 168 hours. No results for input(s): "AMMONIA" in the last 168 hours. Coagulation Profile: No results for input(s): "INR", "PROTIME" in the last 168 hours. Cardiac Enzymes: No results for input(s): "CKTOTAL", "CKMB", "CKMBINDEX", "TROPONINI" in the last 168 hours. BNP (last 3 results) No results for input(s): "PROBNP" in the last 8760 hours. HbA1C: No results for input(s): "HGBA1C" in the last 72 hours. CBG: No results for input(s): "GLUCAP" in the last 168 hours. Lipid Profile: No results for input(s): "CHOL", "HDL", "LDLCALC", "TRIG", "CHOLHDL", "LDLDIRECT" in the last 72 hours. Thyroid Function Tests: No results for input(s): "TSH", "T4TOTAL", "FREET4", "T3FREE", "THYROIDAB" in the last 72 hours. Anemia Panel: No results for input(s): "VITAMINB12", "FOLATE", "FERRITIN", "TIBC", "IRON", "RETICCTPCT" in the last 72 hours. Sepsis Labs: Recent Labs  Lab 03/15/22 0719 03/15/22 0920  PROCALCITON 1.24  --   LATICACIDVEN 3.1* 2.4*    Recent Results (from the past 240 hour(s))  Resp Panel by RT-PCR (Flu A&B, Covid) Anterior Nasal Swab     Status: None   Collection Time: 03/15/22  2:42 AM   Specimen: Anterior Nasal Swab  Result Value Ref Range Status   SARS Coronavirus 2 by RT PCR NEGATIVE NEGATIVE Final    Comment: (NOTE) SARS-CoV-2 target nucleic acids are NOT DETECTED.  The SARS-CoV-2 RNA is generally detectable in upper respiratory specimens during the acute phase of infection. The lowest concentration of SARS-CoV-2 viral copies this assay can detect is 138 copies/mL. A negative result does not preclude SARS-Cov-2 infection and  should not be used as the sole basis for treatment or other patient management decisions. A negative result may occur with  improper specimen collection/handling, submission of specimen other than nasopharyngeal swab, presence of viral mutation(s) within the areas targeted by this assay, and inadequate number of viral copies(<138 copies/mL). A negative result must be combined with clinical observations, patient history, and epidemiological information. The expected result is Negative.  Fact Sheet for Patients:  EntrepreneurPulse.com.au  Fact Sheet for Healthcare Providers:  IncredibleEmployment.be  This test is no t yet approved or cleared by the [place] FDA and  has been authorized for detection and/or diagnosis of SARS-CoV-2 by FDA under an Emergency Use Authorization (EUA). This EUA will remain  in effect (meaning this test can be used) for the duration of the COVID-19 declaration under Section 564(b)(1) of the Act, 21 U.S.C.section 360bbb-3(b)(1), unless the authorization is terminated  or revoked sooner.       Influenza A by PCR NEGATIVE NEGATIVE Final   Influenza B by PCR NEGATIVE NEGATIVE Final    Comment: (NOTE) The Xpert Xpress SARS-CoV-2/FLU/RSV plus assay is intended as an aid in the diagnosis of influenza from Nasopharyngeal swab specimens and should not be used as a sole basis for treatment. Nasal washings and aspirates are unacceptable for Xpert Xpress SARS-CoV-2/FLU/RSV testing.  Fact Sheet for Patients: EntrepreneurPulse.com.au  Fact Sheet for Healthcare Providers: IncredibleEmployment.be  This test is not yet approved or cleared by the [place] FDA and has been authorized for detection and/or diagnosis of SARS-CoV-2 by FDA under an Emergency Use Authorization (EUA). This EUA will remain in effect (meaning this test can be used) for the duration of the COVID-19 declaration  under Section 564(b)(1) of the Act, 21 U.S.C. section 360bbb-3(b)(1), unless the authorization is terminated or revoked.  Performed at [facility], [place] [address], [place], [place] [address]   Group A Strep by PCR     Status: None   Collection Time: 03/15/22  3:43 AM   Specimen: Throat; Sterile Swab  Result Value Ref Range Status   Group A Strep by PCR NOT DETECTED NOT DETECTED Final    Comment: Performed at [facility], [place] [address] [place], [place] [address]  MRSA Next Gen by PCR, Nasal     Status: None   Collection Time: 03/15/22  7:08 AM   Specimen: Nasal Mucosa; Nasal Swab  Result Value Ref Range Status   MRSA by PCR Next Gen NOT DETECTED NOT DETECTED Final    Comment: (NOTE) The GeneXpert MRSA Assay (FDA approved for NASAL specimens only), is one component of a comprehensive MRSA colonization surveillance program. It is not intended to diagnose MRSA infection nor to guide or monitor treatment for MRSA infections. Test performance is not FDA approved in patients less than 6 years old. Performed at [facility], [place] [address] [place], [place] [address]          Radiology Studies: VAS [place] LOWER EXTREMITY VENOUS (DVT)  Result Date: 03/15/2022  Lower Venous DVT  Study Patient Name:  CADENCE HASLAM  Date of Exam:   03/15/2022 Medical Rec #: [id number]        Accession #:    [id number] Date of Birth: Nov 24, 1923        Patient Gender: M Patient Age:   74 years Exam Location:  [facility] Procedure:      VAS [place] LOWER EXTREMITY VENOUS (DVT) Referring Phys: Archie Patten HALL --------------------------------------------------------------------------------  Indications: Swelling, and Edema.  Comparison Study: no prior Performing Technologist: Archie Patten RVS  Examination Guidelines: A complete evaluation includes B-mode imaging, spectral Doppler, color Doppler, and power Doppler as needed of all accessible portions of  each vessel. Bilateral testing is considered an integral part of a complete examination. Limited examinations for reoccurring indications may be performed as noted. The reflux portion of the exam is performed with the patient in reverse Trendelenburg.  +---------+---------------+---------+-----------+----------+--------------+ RIGHT    CompressibilityPhasicitySpontaneityPropertiesThrombus Aging +---------+---------------+---------+-----------+----------+--------------+ CFV      Full           Yes      Yes                                 +---------+---------------+---------+-----------+----------+--------------+ SFJ      Full                                                        +---------+---------------+---------+-----------+----------+--------------+ FV Prox  Full                                                        +---------+---------------+---------+-----------+----------+--------------+ FV Mid   Full                                                        +---------+---------------+---------+-----------+----------+--------------+ FV DistalFull                                                        +---------+---------------+---------+-----------+----------+--------------+ PFV      Full                                                        +---------+---------------+---------+-----------+----------+--------------+ POP      Full           Yes      Yes                                 +---------+---------------+---------+-----------+----------+--------------+ PTV      Full                                                        +---------+---------------+---------+-----------+----------+--------------+  PERO     Full                                                        +---------+---------------+---------+-----------+----------+--------------+   +---------+---------------+---------+-----------+----------+--------------+ LEFT      CompressibilityPhasicitySpontaneityPropertiesThrombus Aging +---------+---------------+---------+-----------+----------+--------------+ CFV      Full           Yes      Yes                                 +---------+---------------+---------+-----------+----------+--------------+ SFJ      Full                                                        +---------+---------------+---------+-----------+----------+--------------+ FV Prox  Full                                                        +---------+---------------+---------+-----------+----------+--------------+ FV Mid   Full                                                        +---------+---------------+---------+-----------+----------+--------------+ FV DistalFull                                                        +---------+---------------+---------+-----------+----------+--------------+ PFV      Full                                                        +---------+---------------+---------+-----------+----------+--------------+ POP      Full           Yes      Yes                                 +---------+---------------+---------+-----------+----------+--------------+ PTV      Full                                                        +---------+---------------+---------+-----------+----------+--------------+ PERO     Full                                                        +---------+---------------+---------+-----------+----------+--------------+       Summary: BILATERAL: - No evidence of deep vein thrombosis seen in the lower extremities, bilaterally. -No evidence of popliteal cyst, bilaterally.   *See table(s) above for measurements and observations.    Preliminary    CT Angio Chest Pulmonary Embolism (PE) W or WO Contrast  Result Date: 03/15/2022 CLINICAL DATA:  Pulmonary embolism suspected. Several hours of shortness of breath. EXAM: CT ANGIOGRAPHY CHEST WITH CONTRAST  TECHNIQUE: Multidetector CT imaging of the chest was performed using the standard protocol during bolus administration of intravenous contrast. Multiplanar CT image reconstructions and MIPs were obtained to evaluate the vascular anatomy. RADIATION DOSE REDUCTION: This exam was performed according to the departmental dose-optimization program which includes automated exposure control, adjustment of the mA and/or kV according to patient size and/or use of iterative reconstruction technique. CONTRAST:  9m OMNIPAQUE IOHEXOL 350 MG/ML SOLN COMPARISON:  10/19/2016 FINDINGS: Cardiovascular: Satisfactory opacification of the pulmonary arteries to the segmental level. No evidence of pulmonary embolism. Borderline heart size. No pericardial effusion. Notable calcification of the aortic valve. Aortic and coronary atherosclerosis. Mediastinum/Nodes: Borderline thickening of subcarinal node, presumably reactive in the setting. Lungs/Pleura: Airway filling of bilateral lower lobe bronchi and right middle lobe bronchi. Streaky density and airspace opacity in the associated lungs. Small right pleural effusion. Coarse calcification in the left upper lobe. No pneumothorax. Upper Abdomen: Partially covered spleen which is enlarged with 13 cm length. Shape and size is similar to 2018. Musculoskeletal: No acute or aggressive finding. Review of the MIP images confirms the above findings. IMPRESSION: 1. Extensive airway filling in the lower lungs with bilateral atelectasis and pneumonia. 2. Negative for pulmonary embolism. 3. Aortic and coronary atherosclerosis. Electronically Signed   By: JJorje GuildM.D.   On: 03/15/2022 07:14   DG Chest Port 1 View  Result Date: 03/15/2022 CLINICAL DATA:  Shortness of breath. EXAM: PORTABLE CHEST 1 VIEW COMPARISON:  10/19/2016. FINDINGS: The heart size and mediastinal contours are within normal limits. There is atherosclerotic calcification of the aorta. Stable calcified granuloma is present  in the left upper lobe. Lung volumes are low with atelectasis at the lung bases. No effusion or pneumothorax. No acute osseous abnormality. IMPRESSION: Low lung volumes with mild atelectasis at the lung bases. Electronically Signed   By: LBrett FairyM.D.   On: 03/15/2022 02:51        Scheduled Meds:  Chlorhexidine Gluconate Cloth  6 each Topical Q0600   escitalopram  5 mg Oral QHS   ipratropium-albuterol  3 mL Nebulization Q6H   midodrine  10 mg Oral TID WC   sodium chloride (PF)       Continuous Infusions:  sodium chloride 250 mL (03/15/22 1140)   azithromycin 500 mg (03/15/22 1151)   cefTRIAXone (ROCEPHIN)  IV Stopped (03/15/22 1151)   phenylephrine (NEO-SYNEPHRINE) Adult infusion       LOS: 0 days    Time spent: 52 minutes spent on chart review, discussion with nursing staff, consultants, updating family and interview/physical exam; more than 50% of that time was spent in counseling and/or coordination of care.    Dush[place]un Okey J [place] DO [facility] Available via Epic secure chat 7am-7pm After these hours, please refer to coverage provider listed on [email or web address] 03/15/2022, 12:51 PM

## 2022-03-16 DIAGNOSIS — J9601 Acute respiratory failure with hypoxia: Secondary | ICD-10-CM | POA: Diagnosis not present

## 2022-03-16 LAB — BASIC METABOLIC PANEL
Anion gap: 8 (ref 5–15)
BUN: 46 mg/dL — ABNORMAL HIGH (ref 8–23)
CO2: 23 mmol/L (ref 22–32)
Calcium: 8.5 mg/dL — ABNORMAL LOW (ref 8.9–10.3)
Chloride: 108 mmol/L (ref 98–111)
Creatinine, Ser: 1.61 mg/dL — ABNORMAL HIGH (ref 0.61–1.24)
GFR, Estimated: 39 mL/min — ABNORMAL LOW (ref >60)
Glucose, Bld: 94 mg/dL (ref 70–99)
Potassium: 4.6 mmol/L (ref 3.5–5.1)
Sodium: 139 mmol/L (ref 135–145)

## 2022-03-16 LAB — URINALYSIS, ROUTINE W REFLEX MICROSCOPIC
Bacteria, UA: NONE SEEN
Bilirubin Urine: NEGATIVE
Glucose, UA: NEGATIVE mg/dL
Ketones, ur: NEGATIVE mg/dL
Leukocytes,Ua: NEGATIVE
Nitrite: NEGATIVE
Protein, ur: 30 mg/dL — AB
RBC / HPF: >50 RBC/hpf — ABNORMAL HIGH (ref 0–5)
Specific Gravity, Urine: 1.032 — ABNORMAL HIGH (ref 1.005–1.030)
pH: 5 (ref 5.0–8.0)

## 2022-03-16 LAB — CBC
HCT: 46.5 % (ref 39.0–52.0)
Hemoglobin: 13.3 g/dL (ref 13.0–17.0)
MCH: 25 pg — ABNORMAL LOW (ref 26.0–34.0)
MCHC: 28.6 g/dL — ABNORMAL LOW (ref 30.0–36.0)
MCV: 87.2 fL (ref 80.0–100.0)
Platelets: 20 10*3/uL — CL (ref 150–400)
RBC: 5.33 MIL/uL (ref 4.22–5.81)
RDW: 22.5 % — ABNORMAL HIGH (ref 11.5–15.5)
WBC: 45.5 10*3/uL — ABNORMAL HIGH (ref 4.0–10.5)
nRBC: 3.8 % — ABNORMAL HIGH (ref 0.0–0.2)

## 2022-03-16 LAB — LACTIC ACID, PLASMA: Lactic Acid, Venous: 1.4 mmol/L (ref 0.5–1.9)

## 2022-03-16 LAB — STREP PNEUMONIAE URINARY ANTIGEN: Strep Pneumo Urinary Antigen: NEGATIVE

## 2022-03-16 LAB — MAGNESIUM: Magnesium: 2 mg/dL (ref 1.7–2.4)

## 2022-03-16 MED ORDER — BETHANECHOL CHLORIDE 10 MG PO TABS
10.0000 mg | ORAL_TABLET | Freq: Three times a day (TID) | ORAL | Status: DC
  Administered 2022-03-16 – 2022-03-21 (×16): 10 mg via ORAL
  Filled 2022-03-16 (×17): qty 1

## 2022-03-16 NOTE — Progress Notes (Signed)
Patient's son at bedside, requested to give patient "more time" to urinate and hold off on placing foley catheter for now. Per son, he wants to avoid any discomfort if possible. Patient son educated on importance of bladder emptying and verbalizes understanding, but would like to hold off for a little longer. Patient was given bethanechol at 1136. Patient in no apparent discomfort at this time. External catheter in place.

## 2022-03-16 NOTE — Progress Notes (Signed)
PROGRESS NOTE    [name]  TKW:[id number] DOB: 12-31-1923 DOA: 03/15/2022 PCP: [name], MD    Brief Narrative:   [name] [name] is a 88 y.o. male [employer] fast streamer, retired Therapist, music engineer/college professor with past medical history significant for polycythemia vera, chronic thrombocytopenia, DVT not on anticoagulation due to low platelet count, BPH, gout, anxiety/depression who presented to [facility] ED on 8/30 via EMS for acute onset shortness of breath.  Patient accompanied by one of his 24-hour caregivers.  He has been in his usual state of health but has had exposure to COVID about 2 weeks ago.  Patient also with productive cough.  Denies any other sick contacts, no changes in his home medications.  In the ED, temperature 98.2 F, HR 115, RR 27, BP 88/61, SPO2 90% on 5 L nasal cannula.  WBC 23.8, hemoglobin 14.9, platelets 20.  Sodium 142, potassium 4.6, chloride 108, CO2 25, glucose 144, BUN 31, creatinine 1.58.  Total bilirubin 1.9.  Lactic acid 2.4.  Procalcitonin 1.24.  BNP 559.1.  COVID-19 PCR negative.  Influenza A/B PCR negative.  Chest x-ray with low lung volumes with mild atelectasis lung bases.  CT angiogram chest with extensive airway filling in the lower lungs with bilateral atelectasis and pneumonia, negative for pulmonary embolism.  TRH consulted for further evaluation management of acute hypoxic respite failure secondary to community-acquired pneumonia with associated hypotension.  Assessment & Plan:   Acute respiratory failure with hypoxia, POA Septic shock secondary to community-acquired pneumonia POA Patient presenting to the ED with acute onset shortness of breath.  Noted to have elevated WBC count of 23.8, procalcitonin of 1.24 with a lactic acid of 2.4.  COVID-19 PCR/influenza A/B PCR negative.  CT angiogram chest negative for PE but with extensive airway filling lower lobes consistent with pneumonia. --PCCM following, appreciate assistance --WBC  23.8>45.5 --Blood cultures x 2: no growth < 24h --Sputum culture: Ordered, but not collected --Azithromycin '500mg'$  IV daily --Ceftriaxone 1g IV q24h --Continue supplemental oxygen, maintain SPO2 greater than 92%, on 8L HFNC (not oxygen dependent at baseline) --Continue phenylephrine drip to maintain MAP greater than 65 --CBC daily, repeat procalcitonin in am, CXR in am  Septic shock Etiology likely secondary to infectious process as above.  Was initially placed on midodrine and somewhat responsive to IV fluid resuscitation but since blood pressure has continued to deteriorate and was placed on a vasopressor chair. --PCCM following, appreciate assistance --Continues on peripheral phenylephrine drip; maintain MAP >65  Polycythemia vera Follows with hematology outpatient, Dr. Burr Medico.  Was previously on Hydrea and underwent intermittent phlebotomies but which are now on hold.  Continue outpatient follow-up with hematology.  Thrombocytopenia, chronic Platelet count of 20, stable.  No active bleeding.  Follows with hematology outpatient. --Platelet transfusion for platelet count less than 10 or active bleeding --CBC daily  Anxiety/depression --Lexapro 5 mg p.o. nightly  BPH: Hold home Hytrin for now  Gout: Hold home allopurinol for now  Weakness/deconditioning/debility: Patient currently lives in a home residence, has 24-hour caregivers and mainly lives on the first floor. --PT/OT evaluation once becomes more stable   DVT prophylaxis: Place and maintain sequential compression device Start: 03/15/22 1111    Code Status: Full Code Family Communication: Updated patient's son, Sim who is the healthcare power of attorney at bedside this morning.  Disposition Plan:  Level of care: Stepdown Status is: Inpatient Remains inpatient appropriate because: Hypotensive, IV antibiotics, requiring higher levels of supplemental oxygen, on vasopressors    Consultants:  PCCM  Procedures:   Vascular duplex ultrasound bilateral lower extremities  Antimicrobials:  Meropenem 8/30 - 8/30 Azithromycin 8/30>> Ceftriaxone 8/30>>   Subjective: Patient seen examined bedside, sleeping but arousable by voice.  Overnight became increasingly agitated and required Precedex drip.  Remains on phenylephrine drip to maintain blood pressures.  Discussed with PCCM this morning, recommend continue antibiotics as currently ordered.  Patient remains on 8 L high flow nasal cannula.  No family present at bedside.  Discussed with RN.  Unable obtain any further ROS due to his current mental status.  No acute concerns this morning per RN.   Objective: Vitals:   03/16/22 0830 03/16/22 0845 03/16/22 0900 03/16/22 0915  BP: (!) 107/47 (!) 99/47 108/71 (!) 105/53  Pulse: (!) 53 (!) 55 84 78  Resp: (!) 21 14 (!) 27 (!) 22  Temp:      TempSrc:      SpO2: 92% 92% 91% (!) 89%  Weight:      Height:        Intake/Output Summary (Last 24 hours) at 03/16/2022 1019 Last data filed at 03/16/2022 1275 Gross per 24 hour  Intake 541.22 ml  Output 870 ml  Net -328.78 ml   Filed Weights   03/15/22 0705  Weight: 67.9 kg    Examination:  Physical Exam: GEN: NAD, alert to voice, elderly/ill/weak in appearance HEENT: NCAT, PERRL, EOMI, sclera clear, MMM PULM: Sounds diminished bilateral bases with crackles, normal respiratory effort without accessory muscle use, no wheezing, on 8 L HFNC with SPO2 94% at rest CV: RRR w/o M/G/R GI: abd soft, NTND, NABS, no R/G/M MSK: no peripheral edema, moves all extremities independently Integumentary: Chronic hyperpigmentation to skin anterior shins, multiple areas of ecchymosis to bilateral upper/lower extremities in various stages of healing.  Otherwise no other concerning rashes/lesions/wounds on exposed skin.     Data Reviewed: I have personally reviewed following labs and imaging studies  CBC: Recent Labs  Lab 03/15/22 0236 03/16/22 0329  WBC 23.8* 45.5*   NEUTROABS 18.4*  --   HGB 14.9 13.3  HCT 54.3* 46.5  MCV 88.7 87.2  PLT 20* 20*   Basic Metabolic Panel: Recent Labs  Lab 03/15/22 0236 03/16/22 0329  NA 142 139  K 4.6 4.6  CL 108 108  CO2 25 23  GLUCOSE 144* 94  BUN 31* 46*  CREATININE 1.58* 1.61*  CALCIUM 9.0 8.5*  MG  --  2.0   GFR: Estimated Creatinine Clearance: 26.4 mL/min (A) (by C-G formula based on SCr of 1.61 mg/dL (H)). Liver Function Tests: Recent Labs  Lab 03/15/22 0236  AST 21  ALT 14  ALKPHOS 73  BILITOT 1.9*  PROT 5.9*  ALBUMIN 3.6   No results for input(s): "LIPASE", "AMYLASE" in the last 168 hours. No results for input(s): "AMMONIA" in the last 168 hours. Coagulation Profile: No results for input(s): "INR", "PROTIME" in the last 168 hours. Cardiac Enzymes: No results for input(s): "CKTOTAL", "CKMB", "CKMBINDEX", "TROPONINI" in the last 168 hours. BNP (last 3 results) No results for input(s): "PROBNP" in the last 8760 hours. HbA1C: No results for input(s): "HGBA1C" in the last 72 hours. CBG: No results for input(s): "GLUCAP" in the last 168 hours. Lipid Profile: No results for input(s): "CHOL", "HDL", "LDLCALC", "TRIG", "CHOLHDL", "LDLDIRECT" in the last 72 hours. Thyroid Function Tests: No results for input(s): "TSH", "T4TOTAL", "FREET4", "T3FREE", "THYROIDAB" in the last 72 hours. Anemia Panel: No results for input(s): "VITAMINB12", "FOLATE", "FERRITIN", "TIBC", "IRON", "RETICCTPCT" in the last  72 hours. Sepsis Labs: Recent Labs  Lab 03/15/22 0719 03/15/22 0920 03/16/22 0329  PROCALCITON 1.24  --   --   LATICACIDVEN 3.1* 2.4* 1.4    Recent Results (from the past 240 hour(s))  Resp Panel by RT-PCR (Flu A&B, Covid) Anterior Nasal Swab     Status: None   Collection Time: 03/15/22  2:42 AM   Specimen: Anterior Nasal Swab  Result Value Ref Range Status   SARS Coronavirus 2 by RT PCR NEGATIVE NEGATIVE Final    Comment: (NOTE) SARS-CoV-2 target nucleic acids are NOT DETECTED.  The  SARS-CoV-2 RNA is generally detectable in upper respiratory specimens during the acute phase of infection. The lowest concentration of SARS-CoV-2 viral copies this assay can detect is 138 copies/mL. A negative result does not preclude SARS-Cov-2 infection and should not be used as the sole basis for treatment or other patient management decisions. A negative result may occur with  improper specimen collection/handling, submission of specimen other than nasopharyngeal swab, presence of viral mutation(s) within the areas targeted by this assay, and inadequate number of viral copies(<138 copies/mL). A negative result must be combined with clinical observations, patient history, and epidemiological information. The expected result is Negative.  Fact Sheet for Patients:  EntrepreneurPulse.com.au  Fact Sheet for Healthcare Providers:  IncredibleEmployment.be  This test is no t yet approved or cleared by the [place] FDA and  has been authorized for detection and/or diagnosis of SARS-CoV-2 by FDA under an Emergency Use Authorization (EUA). This EUA will remain  in effect (meaning this test can be used) for the duration of the COVID-19 declaration under Section 564(b)(1) of the Act, 21 U.S.C.section 360bbb-3(b)(1), unless the authorization is terminated  or revoked sooner.       Influenza A by PCR NEGATIVE NEGATIVE Final   Influenza B by PCR NEGATIVE NEGATIVE Final    Comment: (NOTE) The Xpert Xpress SARS-CoV-2/FLU/RSV plus assay is intended as an aid in the diagnosis of influenza from Nasopharyngeal swab specimens and should not be used as a sole basis for treatment. Nasal washings and aspirates are unacceptable for Xpert Xpress SARS-CoV-2/FLU/RSV testing.  Fact Sheet for Patients: EntrepreneurPulse.com.au  Fact Sheet for Healthcare Providers: IncredibleEmployment.be  This test is not yet approved or  cleared by the [place] FDA and has been authorized for detection and/or diagnosis of SARS-CoV-2 by FDA under an Emergency Use Authorization (EUA). This EUA will remain in effect (meaning this test can be used) for the duration of the COVID-19 declaration under Section 564(b)(1) of the Act, 21 U.S.C. section 360bbb-3(b)(1), unless the authorization is terminated or revoked.  Performed at [facility], [place] [address] [place], [place] [address]   Group A Strep by PCR     Status: None   Collection Time: 03/15/22  3:43 AM   Specimen: Throat; Sterile Swab  Result Value Ref Range Status   Group A Strep by PCR NOT DETECTED NOT DETECTED Final    Comment: Performed at [facility], [place] [address], [place], [place] [address]  MRSA Next Gen by PCR, Nasal     Status: None   Collection Time: 03/15/22  7:08 AM   Specimen: Nasal Mucosa; Nasal Swab  Result Value Ref Range Status   MRSA by PCR Next Gen NOT DETECTED NOT DETECTED Final    Comment: (NOTE) The GeneXpert MRSA Assay (FDA approved for NASAL specimens only), is one component of a comprehensive MRSA colonization surveillance program. It is not intended to diagnose MRSA  infection nor to guide or monitor treatment for MRSA infections. Test performance is not FDA approved in patients less than 3 years old. Performed at [facility], [place] [address], [place], [place] [address]   Culture, blood (Routine X 2) w Reflex to ID Panel     Status: None (Preliminary result)   Collection Time: 03/15/22  7:19 AM   Specimen: BLOOD  Result Value Ref Range Status   Specimen Description   Final    BLOOD BLOOD LEFT FOREARM Performed at [facility] [address], [place], [place] [address]    Special Requests   Final    BOTTLES DRAWN AEROBIC ONLY Blood Culture adequate volume Performed at [facility] [address], [place], [place] [address]     Culture   Final    NO GROWTH < 24 HOURS Performed at [facility] [address] [place], [place] [address]    Report Status PENDING  Incomplete  Culture, blood (Routine X 2) w Reflex to ID Panel     Status: None (Preliminary result)   Collection Time: 03/15/22  7:19 AM   Specimen: BLOOD LEFT HAND  Result Value Ref Range Status   Specimen Description   Final    BLOOD LEFT HAND Performed at [facility] [address], [place], [place] [address]    Special Requests   Final    BOTTLES DRAWN AEROBIC ONLY Blood Culture adequate volume Performed at [facility] [address] [place], [place] [address]    Culture   Final    NO GROWTH < 24 HOURS Performed at [facility] [address], [place],  [address]    Report Status PENDING  Incomplete         Radiology Studies: VAS [place] LOWER EXTREMITY VENOUS (DVT)  Result Date: 03/15/2022  Lower Venous DVT Study Patient Name:  LENIX KIDD  Date of Exam:   03/15/2022 Medical Rec #: [id number]        Accession #:    [id number] Date of Birth: April 08, 1924        Patient Gender: M Patient Age:   78 years Exam Location:  [facility] Procedure:      VAS [place] LOWER EXTREMITY VENOUS (DVT) Referring Phys: Archie Patten HALL --------------------------------------------------------------------------------  Indications: Swelling, and Edema.  Comparison Study: no prior Performing Technologist: Archie Patten RVS  Examination Guidelines: A complete evaluation includes B-mode imaging, spectral Doppler, color Doppler, and power Doppler as needed of all accessible portions of each vessel. Bilateral testing is considered an integral part of a complete examination. Limited examinations for reoccurring indications may be performed as noted. The reflux portion of the exam is performed with the patient in reverse Trendelenburg.  +---------+---------------+---------+-----------+----------+--------------+ RIGHT     CompressibilityPhasicitySpontaneityPropertiesThrombus Aging +---------+---------------+---------+-----------+----------+--------------+ CFV      Full           Yes      Yes                                 +---------+---------------+---------+-----------+----------+--------------+ SFJ      Full                                                        +---------+---------------+---------+-----------+----------+--------------+  FV Prox  Full                                                        +---------+---------------+---------+-----------+----------+--------------+ FV Mid   Full                                                        +---------+---------------+---------+-----------+----------+--------------+ FV DistalFull                                                        +---------+---------------+---------+-----------+----------+--------------+ PFV      Full                                                        +---------+---------------+---------+-----------+----------+--------------+ POP      Full           Yes      Yes                                 +---------+---------------+---------+-----------+----------+--------------+ PTV      Full                                                        +---------+---------------+---------+-----------+----------+--------------+ PERO     Full                                                        +---------+---------------+---------+-----------+----------+--------------+   +---------+---------------+---------+-----------+----------+--------------+ LEFT     CompressibilityPhasicitySpontaneityPropertiesThrombus Aging +---------+---------------+---------+-----------+----------+--------------+ CFV      Full           Yes      Yes                                 +---------+---------------+---------+-----------+----------+--------------+ SFJ      Full                                                         +---------+---------------+---------+-----------+----------+--------------+ FV Prox  Full                                                        +---------+---------------+---------+-----------+----------+--------------+  FV Mid   Full                                                        +---------+---------------+---------+-----------+----------+--------------+ FV DistalFull                                                        +---------+---------------+---------+-----------+----------+--------------+ PFV      Full                                                        +---------+---------------+---------+-----------+----------+--------------+ POP      Full           Yes      Yes                                 +---------+---------------+---------+-----------+----------+--------------+ PTV      Full                                                        +---------+---------------+---------+-----------+----------+--------------+ PERO     Full                                                        +---------+---------------+---------+-----------+----------+--------------+     Summary: BILATERAL: - No evidence of deep vein thrombosis seen in the lower extremities, bilaterally. -No evidence of popliteal cyst, bilaterally.   *See table(s) above for measurements and observations. Electronically signed by Jamelle Haring on 03/15/2022 at 63:49:12 PM.    Final    CT Angio Chest Pulmonary Embolism (PE) W or WO Contrast  Result Date: 03/15/2022 CLINICAL DATA:  Pulmonary embolism suspected. Several hours of shortness of breath. EXAM: CT ANGIOGRAPHY CHEST WITH CONTRAST TECHNIQUE: Multidetector CT imaging of the chest was performed using the standard protocol during bolus administration of intravenous contrast. Multiplanar CT image reconstructions and MIPs were obtained to evaluate the vascular anatomy. RADIATION DOSE REDUCTION: This exam was performed  according to the departmental dose-optimization program which includes automated exposure control, adjustment of the mA and/or kV according to patient size and/or use of iterative reconstruction technique. CONTRAST:  77m OMNIPAQUE IOHEXOL 350 MG/ML SOLN COMPARISON:  10/19/2016 FINDINGS: Cardiovascular: Satisfactory opacification of the pulmonary arteries to the segmental level. No evidence of pulmonary embolism. Borderline heart size. No pericardial effusion. Notable calcification of the aortic valve. Aortic and coronary atherosclerosis. Mediastinum/Nodes: Borderline thickening of subcarinal node, presumably reactive in the setting. Lungs/Pleura: Airway filling of bilateral lower lobe bronchi and right middle lobe bronchi. Streaky density and airspace opacity in the associated lungs. Small right pleural effusion. Coarse calcification in the left  upper lobe. No pneumothorax. Upper Abdomen: Partially covered spleen which is enlarged with 13 cm length. Shape and size is similar to 2018. Musculoskeletal: No acute or aggressive finding. Review of the MIP images confirms the above findings. IMPRESSION: 1. Extensive airway filling in the lower lungs with bilateral atelectasis and pneumonia. 2. Negative for pulmonary embolism. 3. Aortic and coronary atherosclerosis. Electronically Signed   By: Jorje Guild M.D.   On: 03/15/2022 07:14   DG Chest Port 1 View  Result Date: 03/15/2022 CLINICAL DATA:  Shortness of breath. EXAM: PORTABLE CHEST 1 VIEW COMPARISON:  10/19/2016. FINDINGS: The heart size and mediastinal contours are within normal limits. There is atherosclerotic calcification of the aorta. Stable calcified granuloma is present in the left upper lobe. Lung volumes are low with atelectasis at the lung bases. No effusion or pneumothorax. No acute osseous abnormality. IMPRESSION: Low lung volumes with mild atelectasis at the lung bases. Electronically Signed   By: Brett Fairy M.D.   On: 03/15/2022 02:51         Scheduled Meds:  bethanechol  10 mg Oral TID   Chlorhexidine Gluconate Cloth  6 each Topical Q0600   escitalopram  5 mg Oral QHS   ipratropium-albuterol  3 mL Nebulization Q6H   Continuous Infusions:  sodium chloride 250 mL (03/16/22 0912)   azithromycin 500 mg (03/16/22 0915)   cefTRIAXone (ROCEPHIN)  IV Stopped (03/15/22 1151)   dexmedetomidine (PRECEDEX) IV infusion Stopped (03/16/22 0034)   phenylephrine (NEO-SYNEPHRINE) Adult infusion 25 mcg/min (03/16/22 0928)     LOS: 1 day    Critical Care Time Upon my evaluation, this patient had a high probability of imminent or life-threatening deterioration due to septic shock secondary to community-acquired pneumonia requiring vasopressors and Precedex for agitation, which required my direct attention, intervention, and personal management.  I have personally provided 42 minutes of critical care time exclusive of my time spent on separately billable procedures.  Time includes review of laboratory data, radiology results, discussion with consultants, and monitoring for potential decompensation.       Lamin Chandley J [place] DO [facility] Available via Epic secure chat 7am-7pm After these hours, please refer to coverage provider listed on [email or web address] 03/16/2022, 10:19 AM

## 2022-03-16 NOTE — Progress Notes (Signed)
Patient's son was informed over the phone that patient was placed on posey belt because patient was restless, confused and trying to get off his bed. Son said that patient was getting more confused at night starting few months ago, sleeps walk and waking up don't know where he was.

## 2022-03-16 NOTE — Progress Notes (Signed)
NAME:  [name] [name], MRN:  [id number], DOB:  [birth year], LOS: 1 ADMISSION DATE:  03/15/2022, CONSULTATION DATE: 03/15/2022 REFERRING MD: Dr. Nevada Crane, CHIEF COMPLAINT: Shortness of breath  History of Present Illness:  Elderly gentleman who presented with a day history of worsening shortness of breath Was in his usual state the day prior to presentation He had called his caregiver in the early hours of the morning about shortness of breath Some cough, some chest discomfort Usually able to get around easily Was exposed to somebody with COVID 2 weeks ago but tested negative Noted to be hypoxemic on initial evaluation with elevated WBC count   Not known to have any problems swallowing, no history of aspirations No recent hospitalizations  Pertinent  Medical History   Past Medical History:  Diagnosis Date   Bone marrow disease    BPH (benign prostatic hypertrophy)    Depression    DJD (degenerative joint disease)    Dyslipidemia    Gout    HTN (hypertension)    Mild aortic stenosis    Polycythemia vera(238.4)      Significant Hospital Events: Including procedures, antibiotic start and stop dates in addition to other pertinent events   PCCM consult 03/16/2019 2020  Interim History / Subjective:  Eventful night, requiring restraints Agitation  Objective   Blood pressure 132/62, pulse (!) 53, temperature (!) 96.4 F (35.8 C), temperature source Axillary, resp. rate 19, height '5\' 11"'$  (1.803 m), weight 67.9 kg, SpO2 95 %.        Intake/Output Summary (Last 24 hours) at 03/16/2022 0836 Last data filed at 03/16/2022 0800 Gross per 24 hour  Intake 514.12 ml  Output 870 ml  Net -355.88 ml   Filed Weights   03/15/22 0705  Weight: 67.9 kg    Examination: General: Elderly, frail HENT: Moist oral mucosa Lungs: Decreased air entry bilaterally with rales at the bases Cardiovascular: S1-S2 appreciated Abdomen: Bowel sounds appreciated Extremities: No clubbing, no edema Neuro:  Sleepy GU:   Labs reviewed -Increased WBC count-45.5  .  Lactate of 1.4  .  ABG 03/15/2022 noted 7.40/37/70  .  Urinalysis shows negative ketones, negative leukocyte esterase, negative nitrite Resolved Hospital Problem list     Assessment & Plan:  Marland Kitchen  Acute hypoxemic respiratory failure -Continue oxygen supplementation -Increased oxygen requirement likely related to extensive infiltrative process bibasally  .  Community-acquired pneumonia .  Sepsis related to community-acquired pneumonia -Patient has not recently been hospitalized, no recent use of antibiotics -Appropriate to continue same antibiotics and monitor closely -Follow-up blood cultures -Tailor antibiotics for community-acquired pneumonia -Continue ceftriaxone and azithromycin  .  Hypotension -On midodrine -Required addition of Neo-Synephrine  .  Acute kidney injury on chronic kidney disease stage IIIa -GFR of 40 -Avoid nephrotoxic agents -Maintain renal perfusion maintaining MAP greater than 65  . History of polycythemia vera  .  Chronic thrombocytopenia. -Continue to monitor platelet counts -No evidence of bleeding  Check for strep pneumo and Legionella antigen in urine  renew restraints  Best Practice (right click and "Reselect all SmartList Selections" daily)   Diet/type: dysphagia diet (see orders) DVT prophylaxis: SCD GI prophylaxis: N/A Lines: N/A Foley:  N/A Code Status:  full code Last date of multidisciplinary goals of care discussion [per primary]  Labs   CBC: Recent Labs  Lab 03/15/22 0236 03/16/22 0329  WBC 23.8* 45.5*  NEUTROABS 18.4*  --   HGB 14.9 13.3  HCT 54.3* 46.5  MCV 88.7 87.2  PLT 20* 20*  Basic Metabolic Panel: Recent Labs  Lab 03/15/22 0236 03/16/22 0329  NA 142 139  K 4.6 4.6  CL 108 108  CO2 25 23  GLUCOSE 144* 94  BUN 31* 46*  CREATININE 1.58* 1.61*  CALCIUM 9.0 8.5*  MG  --  2.0   GFR: Estimated Creatinine Clearance: 26.4 mL/min (A) (by C-G  formula based on SCr of 1.61 mg/dL (H)). Recent Labs  Lab 03/15/22 0236 03/15/22 0719 03/15/22 0920 03/16/22 0329  PROCALCITON  --  1.24  --   --   WBC 23.8*  --   --  45.5*  LATICACIDVEN  --  3.1* 2.4* 1.4    Liver Function Tests: Recent Labs  Lab 03/15/22 0236  AST 21  ALT 14  ALKPHOS 73  BILITOT 1.9*  PROT 5.9*  ALBUMIN 3.6   No results for input(s): "LIPASE", "AMYLASE" in the last 168 hours. No results for input(s): "AMMONIA" in the last 168 hours.  ABG    Component Value Date/Time   PHART 7.4 03/15/2022 2050   PCO2ART 37 03/15/2022 2050   PO2ART 70 (L) 03/15/2022 2050   HCO3 23.0 03/15/2022 2050   ACIDBASEDEF 1.7 03/15/2022 2050   O2SAT 96.6 03/15/2022 2050     Coagulation Profile: No results for input(s): "INR", "PROTIME" in the last 168 hours.  Cardiac Enzymes: No results for input(s): "CKTOTAL", "CKMB", "CKMBINDEX", "TROPONINI" in the last 168 hours.  HbA1C: No results found for: "HGBA1C"  CBG: No results for input(s): "GLUCAP" in the last 168 hours.  Review of Systems:   Agitation overnight  Past Medical History:  He,  has a past medical history of Bone marrow disease, BPH (benign prostatic hypertrophy), Depression, DJD (degenerative joint disease), Dyslipidemia, Gout, HTN (hypertension), Mild aortic stenosis, and Polycythemia vera(238.4).   Surgical History:   Past Surgical History:  Procedure Laterality Date   ENDOVENOUS ABLATION SAPHENOUS VEIN W/ LASER Left 09/04/2016   endovenous laser ablation left greater saphenous vein by Tinnie Gens MD   HEMORROIDECTOMY       Social History:   reports that he has never smoked. He has never used smokeless tobacco. He reports current alcohol use of about 7.0 standard drinks of alcohol per week. He reports that he does not use drugs.   Family History:  His family history includes Hypertension in his mother.   Allergies Allergies  Allergen Reactions   Amoxicillin Swelling and Other (See Comments)     Reaction:  Hand swelling  Has patient had a PCN reaction causing immediate rash, facial/tongue/throat swelling, SOB or lightheadedness with hypotension: No Has patient had a PCN reaction causing severe rash involving mucus membranes or skin necrosis: Yes Has patient had a PCN reaction that required hospitalization No Has patient had a PCN reaction occurring within the last 10 years: No If all of the above answers are "NO", then may proceed with Cephalosporin use.   Colchicine Swelling and Other (See Comments)    Reaction:  Hand swelling      Sherrilyn Rist, MD [place] PCCM Pager: See Shea Evans

## 2022-03-16 NOTE — Progress Notes (Signed)
[place] Progress Note Patient Name: [name] [name] DOB: Oct 01, 1923 MRN: [id number]   Date of Service  03/16/2022  HPI/Events of Note  Patient remains restless, however, appears somewhat less active than on earlier evaluation. BP = 116/43 with MAP = 62 and HR = 74.   eICU Interventions  Plan: Increase Precedex IV infusion ceiling to 0.6 mcg/kg/hour. Titrate to RASS = 0.      Intervention Category Major Interventions: Delirium, psychosis, severe agitation - evaluation and management  Erol Flanagin [place] 03/16/2022, 1:20 AM

## 2022-03-16 NOTE — Progress Notes (Signed)
[facility] Progress Note Patient Name: MELFORD TULLIER DOB: [birth year] MRN: [id number]   Date of Service  03/16/2022  HPI/Events of Note  Multiple issues: 1. Oliguria - Nursing reports only 50 mL of urine output for this shift. Bladder scan = 313 mL. 2. Nursing request to send UA.   eICU Interventions  Plan: I/O Cath PRN. UA with reflex microscopic now.      Intervention Category Major Interventions: Other:  Abanoub Hanken Cornelia Copa 03/16/2022, 2:54 AM

## 2022-03-17 ENCOUNTER — Inpatient Hospital Stay (HOSPITAL_COMMUNITY): Payer: Medicare Other

## 2022-03-17 DIAGNOSIS — A419 Sepsis, unspecified organism: Secondary | ICD-10-CM | POA: Diagnosis not present

## 2022-03-17 DIAGNOSIS — J9601 Acute respiratory failure with hypoxia: Secondary | ICD-10-CM | POA: Diagnosis not present

## 2022-03-17 DIAGNOSIS — R6521 Severe sepsis with septic shock: Secondary | ICD-10-CM

## 2022-03-17 LAB — CBC
HCT: 47 % (ref 39.0–52.0)
Hemoglobin: 13 g/dL (ref 13.0–17.0)
MCH: 24.5 pg — ABNORMAL LOW (ref 26.0–34.0)
MCHC: 27.7 g/dL — ABNORMAL LOW (ref 30.0–36.0)
MCV: 88.7 fL (ref 80.0–100.0)
Platelets: 20 10*3/uL — CL (ref 150–400)
RBC: 5.3 MIL/uL (ref 4.22–5.81)
RDW: 22.9 % — ABNORMAL HIGH (ref 11.5–15.5)
WBC: 28.2 10*3/uL — ABNORMAL HIGH (ref 4.0–10.5)
nRBC: 6.5 % — ABNORMAL HIGH (ref 0.0–0.2)

## 2022-03-17 LAB — LEGIONELLA PNEUMOPHILA SEROGP 1 UR AG: L. pneumophila Serogp 1 Ur Ag: NEGATIVE

## 2022-03-17 LAB — BASIC METABOLIC PANEL
Anion gap: 9 (ref 5–15)
BUN: 46 mg/dL — ABNORMAL HIGH (ref 8–23)
CO2: 22 mmol/L (ref 22–32)
Calcium: 8.4 mg/dL — ABNORMAL LOW (ref 8.9–10.3)
Chloride: 110 mmol/L (ref 98–111)
Creatinine, Ser: 1.56 mg/dL — ABNORMAL HIGH (ref 0.61–1.24)
GFR, Estimated: 41 mL/min — ABNORMAL LOW (ref >60)
Glucose, Bld: 71 mg/dL (ref 70–99)
Potassium: 4.3 mmol/L (ref 3.5–5.1)
Sodium: 141 mmol/L (ref 135–145)

## 2022-03-17 LAB — PROCALCITONIN: Procalcitonin: 8.63 ng/mL

## 2022-03-17 MED ORDER — MELATONIN 3 MG PO TABS
3.0000 mg | ORAL_TABLET | Freq: Once | ORAL | Status: AC
  Administered 2022-03-17: 3 mg via ORAL
  Filled 2022-03-17: qty 1

## 2022-03-17 MED ORDER — IPRATROPIUM-ALBUTEROL 0.5-2.5 (3) MG/3ML IN SOLN
3.0000 mL | Freq: Three times a day (TID) | RESPIRATORY_TRACT | Status: DC
  Administered 2022-03-17: 3 mL via RESPIRATORY_TRACT
  Filled 2022-03-17: qty 3

## 2022-03-17 MED ORDER — IPRATROPIUM-ALBUTEROL 0.5-2.5 (3) MG/3ML IN SOLN
3.0000 mL | Freq: Two times a day (BID) | RESPIRATORY_TRACT | Status: DC
  Administered 2022-03-17 – 2022-03-19 (×5): 3 mL via RESPIRATORY_TRACT
  Filled 2022-03-17 (×5): qty 3

## 2022-03-17 NOTE — Evaluation (Signed)
Occupational Therapy Evaluation Patient Details Name: [name] [name] MRN: [id number] DOB: 03/11/1924 Today's Date: 03/17/2022   History of Present Illness 88 yo who [facility] ED on 8/30 via EMS for acute onset shortness of breath. Pt with acute respiratory failure with hypoxia, septic shock secondary to community acquired pneumonia; AKI on CKD.  PMH: Bone marrow disease, depression, DYD, Gout, HTN, polycythemia   Clinical Impression   PTA pt lives at home with his wife and caregivers, who assist 12 hr/day. Pt able to ambulate @ 80 ft @ RW level with 1 seated rest break on 4-6 L with VSS. Session ended due to pt being incontinent of BM in hallway. Requires overall Mod A for ADL tasks. Discussed with son and caregiver and they will be able to provide necessary level of recommended 24/7 assistance for safe DC home. Recommend HHOT, Acute OT to follow. Family very appreciative.       Recommendations for follow up therapy are one component of a multi-disciplinary discharge planning process, led by the attending physician.  Recommendations may be updated based on patient status, additional functional criteria and insurance authorization.   Follow Up Recommendations  Home health OT    Assistance Recommended at Discharge Frequent or constant Supervision/Assistance  Patient can return home with the following A little help with walking and/or transfers;A little help with bathing/dressing/bathroom;Assistance with cooking/housework;Direct supervision/assist for medications management;Direct supervision/assist for financial management;Assist for transportation;Help with stairs or ramp for entrance    Functional Status Assessment  Patient has had a recent decline in their functional status and demonstrates the ability to make significant improvements in function in a reasonable and predictable amount of time.  Equipment Recommendations  None recommended by OT    Recommendations for Other Services        Precautions / Restrictions Precautions Precautions: Fall      Mobility Bed Mobility               General bed mobility comments: OOB in chair    Transfers Overall transfer level: Needs assistance Equipment used: Rolling walker (2 wheels) Transfers: Sit to/from Stand Sit to Stand: Min assist, +2 safety/equipment           General transfer comment: 1 LOB posteriorly initially; physical assist to recover      Balance Overall balance assessment: Needs assistance   Sitting balance-Leahy Scale: Good       Standing balance-Leahy Scale: Poor                             ADL either performed or assessed with clinical judgement   ADL Overall ADL's : Needs assistance/impaired     Grooming: Set up;Supervision/safety   Upper Body Bathing: Set up;Supervision/ safety;Sitting   Lower Body Bathing: Moderate assistance;Sit to/from stand   Upper Body Dressing : Set up;Supervision/safety;Sitting   Lower Body Dressing: Moderate assistance;Sit to/from stand   Toilet Transfer: Minimal assistance;+2 for safety/equipment;Ambulation;Rolling walker (2 wheels)   Toileting- Clothing Manipulation and Hygiene: Total assistance Toileting - Clothing Manipulation Details (indicate cue type and reason): icontinenet of BM; foley     Functional mobility during ADLs: Minimal assistance;+2 for safety/equipment;Rolling walker (2 wheels)           Caregiver states they use a gait belt for stair mobility however pt rarely leaves the home   Vision Baseline Vision/History: 1 Wears glasses       Perception     Praxis  Pertinent Vitals/Pain Pain Assessment Pain Assessment: No/denies pain     Hand Dominance Right   Extremity/Trunk Assessment Upper Extremity Assessment Upper Extremity Assessment: Generalized weakness   Lower Extremity Assessment Lower Extremity Assessment: Defer to PT evaluation   Cervical / Trunk Assessment Cervical / Trunk Assessment:  Kyphotic   Communication Communication Communication: HOH   Cognition Arousal/Alertness: Awake/alert Behavior During Therapy: WFL for tasks assessed/performed Overall Cognitive Status: History of cognitive impairments - at baseline                                 General Comments: disucssed with caregiver/son     General Comments  cachexic    Exercises Exercises: Other exercises Other Exercises Other Exercises: issued level 1 theraband for general UB exercise to careigver to complete with pt   Shoulder Instructions      Home Living Family/patient expects to be discharged to:: Private residence Living Arrangements: Spouse/significant other;Other relatives Available Help at Discharge: Available 24 hours/day;Other (Comment) (Has PCA 12 hours/day) Type of Home: House Home Access: Stairs to enter [employer] of Steps: 5 Entrance Stairs-Rails: Right Home Layout: One level     Bathroom Shower/Tub: Occupational psychologist: Handicapped height Bathroom Accessibility: Yes How Accessible: Accessible via walker Home Equipment: [place] (2 wheels);Cane - single point;BSC/3in1;Shower seat;Grab bars - toilet;Grab bars - tub/shower;Hand held shower head;Wheelchair - manual;Hospital bed;Other (comment) (hoyer)          Prior Functioning/Environment Prior Level of Function : Needs assist  Cognitive Assist : Mobility (cognitive);ADLs (cognitive) Mobility (Cognitive): Intermittent cues ADLs (Cognitive): Set up cues Physical Assist : ADLs (physical)   ADLs (physical): Bathing;Dressing;Toileting;IADLs Mobility Comments: per caregiver has required more assistance over the last 2 months ADLs Comments: Pt states he is normally ableto bath/dress himself        OT Problem List: Decreased strength;Decreased activity tolerance;Impaired balance (sitting and/or standing);Decreased safety awareness;Decreased knowledge of use of DME or AE;Cardiopulmonary  status limiting activity      OT Treatment/Interventions: Self-care/ADL training;Therapeutic exercise;Energy conservation;DME and/or AE instruction;Therapeutic activities;Cognitive remediation/compensation;Patient/family education;Balance training    OT Goals(Current goals can be found in the care plan section) Acute Rehab OT Goals Patient Stated Goal: to go home OT Goal Formulation: With patient/family Time For Goal Achievement: 03/31/22 Potential to Achieve Goals: Good  OT Frequency: Min 2X/week    Co-evaluation PT/OT/SLP Co-Evaluation/Treatment: Yes Reason for Co-Treatment: For patient/therapist safety;To address functional/ADL transfers   OT goals addressed during session: ADL's and self-care      AM-PAC OT "6 Clicks" Daily Activity     Outcome Measure Help from another person eating meals?: A Little Help from another person taking care of personal grooming?: A Little Help from another person toileting, which includes using toliet, bedpan, or urinal?: Total Help from another person bathing (including washing, rinsing, drying)?: A Lot Help from another person to put on and taking off regular upper body clothing?: A Little Help from another person to put on and taking off regular lower body clothing?: A Lot 6 Click Score: 14   End of Session Equipment Utilized During Treatment: Gait belt;Rolling walker (2 wheels);Oxygen (4-6 L) Nurse Communication: Mobility status  Activity Tolerance: Patient tolerated treatment well Patient left: in chair;with call bell/phone within reach;with chair alarm set;with family/visitor present;with nursing/sitter in room  OT Visit Diagnosis: Unsteadiness on feet (R26.81);Other abnormalities of gait and mobility (R26.89);Muscle weakness (generalized) (M62.81);Other symptoms and signs involving cognitive function  Time: 1105-1140 OT Time Calculation (min): 35 min Charges:  OT General Charges $OT Visit: 1 Visit OT Evaluation $OT Eval  Moderate Complexity: [place], OT/L   Acute OT Clinical Specialist Acute Rehabilitation Services Pager [phone] Office [phone]   [facility] 03/17/2022, 1:17 PM

## 2022-03-17 NOTE — Progress Notes (Signed)
PROGRESS NOTE    [name] [name]  ATF:[id number] DOB: 30-Apr-1924 DOA: 03/15/2022 PCP: [name], MD    Brief Narrative:   [name] [name] is a 88 y.o. male [employer] fast streamer, retired Therapist, music engineer/college professor with past medical history significant for polycythemia vera, chronic thrombocytopenia, DVT not on anticoagulation due to low platelet count, BPH, gout, anxiety/depression who presented to [facility] ED on 8/30 via EMS for acute onset shortness of breath.  Patient accompanied by one of his 24-hour caregivers.  He has been in his usual state of health but has had exposure to COVID about 2 weeks ago.  Patient also with productive cough.  Denies any other sick contacts, no changes in his home medications.  In the ED, temperature 98.2 F, HR 115, RR 27, BP 88/61, SPO2 90% on 5 L nasal cannula.  WBC 23.8, hemoglobin 14.9, platelets 20.  Sodium 142, potassium 4.6, chloride 108, CO2 25, glucose 144, BUN 31, creatinine 1.58.  Total bilirubin 1.9.  Lactic acid 2.4.  Procalcitonin 1.24.  BNP 559.1.  COVID-19 PCR negative.  Influenza A/B PCR negative.  Chest x-ray with low lung volumes with mild atelectasis lung bases.  CT angiogram chest with extensive airway filling in the lower lungs with bilateral atelectasis and pneumonia, negative for pulmonary embolism.  TRH consulted for further evaluation management of acute hypoxic respite failure secondary to community-acquired pneumonia with associated hypotension.  Assessment & Plan:   Acute respiratory failure with hypoxia, POA Septic shock secondary to community-acquired pneumonia POA Patient presenting to the ED with acute onset shortness of breath.  Noted to have elevated WBC count of 23.8, procalcitonin of 1.24 with a lactic acid of 2.4.  COVID-19 PCR/influenza A/B PCR negative.  CT angiogram chest negative for PE but with extensive airway filling lower lobes consistent with pneumonia. --PCCM following, appreciate assistance --WBC  23.8>45.5>28.2 --PCT 1.24>8.63 --Blood cultures x 2: no growth x 2 days --Sputum culture: Ordered, but not collected --Azithromycin '500mg'$  IV daily --Ceftriaxone 1g IV q24h --Continue supplemental oxygen, maintain SPO2 greater than 92%, on 8L HFNC (not oxygen dependent at baseline) --Titrated off of phenylephrine drip overnight, continue monitor BP closely --CBC daily, repeat procalcitonin in am  Septic shock Etiology likely secondary to infectious process as above.  Was initially placed on midodrine and somewhat responsive to IV fluid resuscitation but since blood pressure has continued to deteriorate and was placed on a vasopressor chair. --PCCM following, appreciate assistance --Titrate off of phenylephrine drip overnight --Continue monitor BP closely  Polycythemia vera Follows with hematology outpatient, Dr. Burr Medico.  Was previously on Hydrea and underwent intermittent phlebotomies but which are now on hold.  Continue outpatient follow-up with hematology.  Thrombocytopenia, chronic Platelet count of 20, stable.  No active bleeding.  Follows with hematology outpatient. --Platelet transfusion for platelet count less than 10 or active bleeding --CBC daily  Anxiety/depression --Lexapro 5 mg p.o. nightly  BPH: Hold home Hytrin for now  Gout: Hold home allopurinol for now  Weakness/deconditioning/debility: Patient currently lives in a home residence, has 24-hour caregivers and mainly lives on the first floor. --PT/OT evaluation once becomes more stable   DVT prophylaxis: Place and maintain sequential compression device Start: 03/15/22 1111    Code Status: Full Code Family Communication: Updated patient's son, Sim who is the healthcare power of attorney at bedside this morning.  Disposition Plan:  Level of care: Stepdown Status is: Inpatient Remains inpatient appropriate because: IV antibiotics, requiring high levels of supplemental oxygen    Consultants:  PCCM  Procedures:   Vascular duplex ultrasound bilateral lower extremities  Antimicrobials:  Meropenem 8/30 - 8/30 Azithromycin 8/30>> Ceftriaxone 8/30>>   Subjective: Patient seen examined bedside, sitting in bedside chair.  Son present.  Titrated off of phenylephrine drip overnight.  Blood pressure is now more stable.  Patient much more alert and oriented today.  No reported agitation overnight.  WBC count slightly improved but continues on 8 L high flow nasal cannula. Discussed with RN.  No other questions or concerns at this time.  Patient denies headache, no dizziness, no fever/chills/night sweats, no nausea/vomiting/diarrhea, no chest pain, no palpitations, no abdominal pain.  No acute events reported overnight by nursing staff.   Objective: Vitals:   03/17/22 0845 03/17/22 0900 03/17/22 0930 03/17/22 1000  BP:  (!) 121/101 (!) 106/59 (!) 109/55  Pulse: 96 (!) 101 94 96  Resp: (!) 28 (!) '23 17 19  '$ Temp:      TempSrc:      SpO2: 94% 95% 95% 92%  Weight:      Height:        Intake/Output Summary (Last 24 hours) at 03/17/2022 1027 Last data filed at 03/17/2022 0430 Gross per 24 hour  Intake 591.45 ml  Output 1525 ml  Net -933.55 ml   Filed Weights   03/15/22 0705  Weight: 67.9 kg    Examination:  Physical Exam: GEN: NAD, alert and oriented x4, elderly/ill/weak in appearance HEENT: NCAT, PERRL, EOMI, sclera clear, MMM PULM: Sounds diminished bilateral bases with crackles, normal respiratory effort without accessory muscle use, no wheezing, on 8 L HFNC with SPO2 94% at rest CV: RRR w/o M/G/R GI: abd soft, NTND, NABS, no R/G/M MSK: no peripheral edema, moves all extremities independently Integumentary: Chronic hyperpigmentation to skin anterior shins, multiple areas of ecchymosis to bilateral upper/lower extremities in various stages of healing.  Otherwise no other concerning rashes/lesions/wounds on exposed skin.     Data Reviewed: I have personally reviewed following labs and imaging  studies  CBC: Recent Labs  Lab 03/15/22 0236 03/16/22 0329 03/17/22 0248  WBC 23.8* 45.5* 28.2*  NEUTROABS 18.4*  --   --   HGB 14.9 13.3 13.0  HCT 54.3* 46.5 47.0  MCV 88.7 87.2 88.7  PLT 20* 20* 20*   Basic Metabolic Panel: Recent Labs  Lab 03/15/22 0236 03/16/22 0329 03/17/22 0248  NA 142 139 141  K 4.6 4.6 4.3  CL 108 108 110  CO2 '25 23 22  '$ GLUCOSE 144* 94 71  BUN 31* 46* 46*  CREATININE 1.58* 1.61* 1.56*  CALCIUM 9.0 8.5* 8.4*  MG  --  2.0  --    GFR: Estimated Creatinine Clearance: 27.2 mL/min (A) (by C-G formula based on SCr of 1.56 mg/dL (H)). Liver Function Tests: Recent Labs  Lab 03/15/22 0236  AST 21  ALT 14  ALKPHOS 73  BILITOT 1.9*  PROT 5.9*  ALBUMIN 3.6   No results for input(s): "LIPASE", "AMYLASE" in the last 168 hours. No results for input(s): "AMMONIA" in the last 168 hours. Coagulation Profile: No results for input(s): "INR", "PROTIME" in the last 168 hours. Cardiac Enzymes: No results for input(s): "CKTOTAL", "CKMB", "CKMBINDEX", "TROPONINI" in the last 168 hours. BNP (last 3 results) No results for input(s): "PROBNP" in the last 8760 hours. HbA1C: No results for input(s): "HGBA1C" in the last 72 hours. CBG: No results for input(s): "GLUCAP" in the last 168 hours. Lipid Profile: No results for input(s): "CHOL", "HDL", "LDLCALC", "TRIG", "CHOLHDL", "LDLDIRECT" in the last  72 hours. Thyroid Function Tests: No results for input(s): "TSH", "T4TOTAL", "FREET4", "T3FREE", "THYROIDAB" in the last 72 hours. Anemia Panel: No results for input(s): "VITAMINB12", "FOLATE", "FERRITIN", "TIBC", "IRON", "RETICCTPCT" in the last 72 hours. Sepsis Labs: Recent Labs  Lab 03/15/22 0719 03/15/22 0920 03/16/22 0329 03/17/22 0248  PROCALCITON 1.24  --   --  8.63  LATICACIDVEN 3.1* 2.4* 1.4  --     Recent Results (from the past 240 hour(s))  Resp Panel by RT-PCR (Flu A&B, Covid) Anterior Nasal Swab     Status: None   Collection Time: 03/15/22   2:42 AM   Specimen: Anterior Nasal Swab  Result Value Ref Range Status   SARS Coronavirus 2 by RT PCR NEGATIVE NEGATIVE Final    Comment: (NOTE) SARS-CoV-2 target nucleic acids are NOT DETECTED.  The SARS-CoV-2 RNA is generally detectable in upper respiratory specimens during the acute phase of infection. The lowest concentration of SARS-CoV-2 viral copies this assay can detect is 138 copies/mL. A negative result does not preclude SARS-Cov-2 infection and should not be used as the sole basis for treatment or other patient management decisions. A negative result may occur with  improper specimen collection/handling, submission of specimen other than nasopharyngeal swab, presence of viral mutation(s) within the areas targeted by this assay, and inadequate number of viral copies(<138 copies/mL). A negative result must be combined with clinical observations, patient history, and epidemiological information. The expected result is Negative.  Fact Sheet for Patients:  EntrepreneurPulse.com.au  Fact Sheet for Healthcare Providers:  IncredibleEmployment.be  This test is no t yet approved or cleared by the [place] FDA and  has been authorized for detection and/or diagnosis of SARS-CoV-2 by FDA under an Emergency Use Authorization (EUA). This EUA will remain  in effect (meaning this test can be used) for the duration of the COVID-19 declaration under Section 564(b)(1) of the Act, 21 U.S.C.section 360bbb-3(b)(1), unless the authorization is terminated  or revoked sooner.       Influenza A by PCR NEGATIVE NEGATIVE Final   Influenza B by PCR NEGATIVE NEGATIVE Final    Comment: (NOTE) The Xpert Xpress SARS-CoV-2/FLU/RSV plus assay is intended as an aid in the diagnosis of influenza from Nasopharyngeal swab specimens and should not be used as a sole basis for treatment. Nasal washings and aspirates are unacceptable for Xpert Xpress  SARS-CoV-2/FLU/RSV testing.  Fact Sheet for Patients: EntrepreneurPulse.com.au  Fact Sheet for Healthcare Providers: IncredibleEmployment.be  This test is not yet approved or cleared by the [place] FDA and has been authorized for detection and/or diagnosis of SARS-CoV-2 by FDA under an Emergency Use Authorization (EUA). This EUA will remain in effect (meaning this test can be used) for the duration of the COVID-19 declaration under Section 564(b)(1) of the Act, 21 U.S.C. section 360bbb-3(b)(1), unless the authorization is terminated or revoked.  Performed at [facility], [place] [address] [place], [place] [address]   Group A Strep by PCR     Status: None   Collection Time: 03/15/22  3:43 AM   Specimen: Throat; Sterile Swab  Result Value Ref Range Status   Group A Strep by PCR NOT DETECTED NOT DETECTED Final    Comment: Performed at [facility], [place] [address] [place], [place] [address]  MRSA Next Gen by PCR, Nasal     Status: None   Collection Time: 03/15/22  7:08 AM   Specimen: Nasal Mucosa; Nasal Swab  Result Value Ref Range Status   MRSA by  PCR Next Gen NOT DETECTED NOT DETECTED Final    Comment: (NOTE) The GeneXpert MRSA Assay (FDA approved for NASAL specimens only), is one component of a comprehensive MRSA colonization surveillance program. It is not intended to diagnose MRSA infection nor to guide or monitor treatment for MRSA infections. Test performance is not FDA approved in patients less than 34 years old. Performed at [facility], [place] [address], [place], [place] [address]   Culture, blood (Routine X 2) w Reflex to ID Panel     Status: None (Preliminary result)   Collection Time: 03/15/22  7:19 AM   Specimen: BLOOD  Result Value Ref Range Status   Specimen Description   Final    BLOOD BLOOD LEFT FOREARM Performed at [facility]  [address], [place], [place] [address]    Special Requests   Final    BOTTLES DRAWN AEROBIC ONLY Blood Culture adequate volume Performed at [facility] [address], [place], [place] [address]    Culture   Final    NO GROWTH 2 DAYS Performed at [facility] [address], [place], [place] [address]    Report Status PENDING  Incomplete  Culture, blood (Routine X 2) w Reflex to ID Panel     Status: None (Preliminary result)   Collection Time: 03/15/22  7:19 AM   Specimen: BLOOD LEFT HAND  Result Value Ref Range Status   Specimen Description   Final    BLOOD LEFT HAND Performed at [facility] [address] [place], [place] [address]    Special Requests   Final    BOTTLES DRAWN AEROBIC ONLY Blood Culture adequate volume Performed at [facility] [address], [place], [place] [address]    Culture   Final    NO GROWTH 2 DAYS Performed at [facility] [address] [place],  [address]    Report Status PENDING  Incomplete         Radiology Studies: DG CHEST PORT 1 VIEW  Result Date: 03/17/2022 CLINICAL DATA:  Shortness of breath. EXAM: PORTABLE CHEST 1 VIEW COMPARISON:  March 15, 2022 FINDINGS: Stable cardiac and mediastinal contours. Low lung volumes. Interval increase in bilateral interstitial pulmonary opacities. Possible trace right pleural effusion. No pneumothorax. Thoracic spine degenerative changes. IMPRESSION: Interval increase in bilateral airspace opacities suggestive of multifocal infection. Possible small right pleural effusion. Electronically Signed   By: Lovey Newcomer M.D.   On: 03/17/2022 07:48        Scheduled Meds:  bethanechol  10 mg Oral TID   Chlorhexidine Gluconate Cloth  6 each Topical Q0600   escitalopram  5 mg Oral QHS   ipratropium-albuterol  3 mL Nebulization TID   Continuous Infusions:  sodium chloride 10 mL/hr at 03/16/22 1141   azithromycin 500 mg (03/17/22  0948)   cefTRIAXone (ROCEPHIN)  IV Stopped (03/16/22 1212)     LOS: 2 days    Time spent: 53 minutes spent on chart review, discussion with nursing staff, consultants, updating family and interview/physical exam; more than 50% of that time was spent in counseling and/or coordination of care.     Austen Wygant J [place] DO [facility] Available via Epic secure chat 7am-7pm After these hours, please refer to coverage provider listed on [email or web address] 03/17/2022, 10:27 AM

## 2022-03-17 NOTE — Progress Notes (Signed)
NAME:  [name] [name], MRN:  [id number], DOB:  [birth year], LOS: 2 ADMISSION DATE:  03/15/2022, CONSULTATION DATE: 03/15/2022 REFERRING MD: Dr. Nevada Crane, CHIEF COMPLAINT: Shortness of breath  History of Present Illness:  Elderly gentleman who presented with a day history of worsening shortness of breath Was in his usual state the day prior to presentation He had called his caregiver in the early hours of the morning about shortness of breath Some cough, some chest discomfort Usually able to get around easily Was exposed to somebody with COVID 2 weeks ago but tested negative Noted to be hypoxemic on initial evaluation with elevated WBC count   Not known to have any problems swallowing, no history of aspirations No recent hospitalizations  Pertinent  Medical History   Past Medical History:  Diagnosis Date   Bone marrow disease    BPH (benign prostatic hypertrophy)    Depression    DJD (degenerative joint disease)    Dyslipidemia    Gout    HTN (hypertension)    Mild aortic stenosis    Polycythemia vera(238.4)      Significant Hospital Events: Including procedures, antibiotic start and stop dates in addition to other pertinent events   8/30 CT angiogram chest airspace disease bilateral bases with mucous in bilateral lower lobe bronchi 8/31 urinary retention requiring Foley  Interim History / Subjective:   Remains on 7 L high flow nasal cannula Afebrile 1.5 L urine output Off Neo-Synephrine since 11 PM  Objective   Blood pressure (!) 121/101, pulse (!) 101, temperature 98.4 F (36.9 C), temperature source Oral, resp. rate (!) 23, height '5\' 11"'$  (1.803 m), weight 67.9 kg, SpO2 95 %.        Intake/Output Summary (Last 24 hours) at 03/17/2022 0942 Last data filed at 03/17/2022 0430 Gross per 24 hour  Intake 591.45 ml  Output 1525 ml  Net -933.55 ml    Filed Weights   03/15/22 0705  Weight: 67.9 kg    Examination: General: Elderly, frail, out of bed to chair HENT:  Moist oral mucosa Lungs: Clear breath sounds bilateral, dry crackles at right base Cardiovascular: S1-S2 normal, ejection systolic murmur 2/6 at base Abdomen: Bowel sounds appreciated Extremities: No clubbing, no edema Neuro: Hard, interactive, nonfocal GU: Clear urine with Foley  Labs reviewed -Decreased leukocytosis, resolved lactic acidosis, severe thrombocytopenia  .  ABG 03/15/2022 noted 7.40/37/70  Resolved Hospital Problem list     Assessment & Plan:  Marland Kitchen  Acute hypoxemic respiratory failure .  Community-acquired pneumonia , urine strep antigen negative  -Continue ceftriaxone and azithromycin -CT scan from 10/2016 shows bibasal early ILD -Await urine Legionella for completion -Dial down oxygen as he improves  .  Septic shock, resolved, off Neo-Synephrine -DC midodrine  .  Acute kidney injury on chronic kidney disease stage IIIa -GFR of 40 -Avoid nephrotoxic agents -Maintain renal perfusion maintaining MAP greater than 65  . History of polycythemia vera  .  Chronic thrombocytopenia. -Continue to monitor platelet counts -Transfuse only for platelets less than 10 or if bleeding  PCCM will be available as needed  Best Practice (right click and "Reselect all SmartList Selections" daily)   Diet/type: dysphagia diet (see orders) DVT prophylaxis: SCD GI prophylaxis: N/A Lines: N/A Foley:  N/A Code Status:  full code Last date of multidisciplinary goals of care discussion [per primary]  Labs   CBC: Recent Labs  Lab 03/15/22 0236 03/16/22 0329 03/17/22 0248  WBC 23.8* 45.5* 28.2*  NEUTROABS 18.4*  --   --  HGB 14.9 13.3 13.0  HCT 54.3* 46.5 47.0  MCV 88.7 87.2 88.7  PLT 20* 20* 20*     Basic Metabolic Panel: Recent Labs  Lab 03/15/22 0236 03/16/22 0329 03/17/22 0248  NA 142 139 141  K 4.6 4.6 4.3  CL 108 108 110  CO2 '25 23 22  '$ GLUCOSE 144* 94 71  BUN 31* 46* 46*  CREATININE 1.58* 1.61* 1.56*  CALCIUM 9.0 8.5* 8.4*  MG  --  2.0  --      GFR: Estimated Creatinine Clearance: 27.2 mL/min (A) (by C-G formula based on SCr of 1.56 mg/dL (H)). Recent Labs  Lab 03/15/22 0236 03/15/22 0719 03/15/22 0920 03/16/22 0329 03/17/22 0248  PROCALCITON  --  1.24  --   --  8.63  WBC 23.8*  --   --  45.5* 28.2*  LATICACIDVEN  --  3.1* 2.4* 1.4  --      Liver Function Tests: Recent Labs  Lab 03/15/22 0236  AST 21  ALT 14  ALKPHOS 73  BILITOT 1.9*  PROT 5.9*  ALBUMIN 3.6    No results for input(s): "LIPASE", "AMYLASE" in the last 168 hours. No results for input(s): "AMMONIA" in the last 168 hours.  ABG    Component Value Date/Time   PHART 7.4 03/15/2022 2050   PCO2ART 37 03/15/2022 2050   PO2ART 70 (L) 03/15/2022 2050   HCO3 23.0 03/15/2022 2050   ACIDBASEDEF 1.7 03/15/2022 2050   O2SAT 96.6 03/15/2022 2050     Coagulation Profile: No results for input(s): "INR", "PROTIME" in the last 168 hours.  Cardiac Enzymes: No results for input(s): "CKTOTAL", "CKMB", "CKMBINDEX", "TROPONINI" in the last 168 hours.  HbA1C: No results found for: "HGBA1C"  CBG: No results for input(s): "GLUCAP" in the last 168 hours.  Kara Mead MD. Shade Flood. [facility] Pager : [phone]  If no response to pager , please call [phone] until 7 pm After 7:00 pm call Elink  [phone]   03/17/2022

## 2022-03-17 NOTE — Progress Notes (Signed)
[facility] Progress Note Patient Name: ARMSTRONG CREASY DOB: Apr 21, 1924 MRN: [id number]   Date of Service  03/17/2022  HPI/Events of Note  Nursing request to renew order for continuous cardiac monitoring.   eICU Interventions  Plan: Will renew order for continuous cardiac monitoring X 48 hours.      Intervention Category Major Interventions: Other:  Lysle Dingwall 03/17/2022, 5:13 AM

## 2022-03-17 NOTE — Evaluation (Signed)
rPhysical Therapy Evaluation Patient Details Name: [name] [name] MRN: [id number] DOB: Dec 02, 1923 Today's Date: 03/17/2022  History of Present Illness  88 yo who presented to [facility] ED on 8/30 via EMS for acute onset shortness of breath. Pt with acute respiratory failure with hypoxia, septic shock secondary to community acquired pneumonia; AKI on CKD.  PMH: Bone marrow disease, depression, DYD, Gout, HTN, polycythemia  Clinical Impression   Patient  admitted for above problems. Son present to provide details. Patient resides at home with caregivers 12 hrs/day. Son reports  possible to increase to 24/7 as patient currently will need assistnace.  Patient ambulated x 40' x 2 with RW on 4 L O2, Spo2 > 94%. Pt admitted with above diagnosis.  Pt currently with functional limitations due to the deficits listed below (see PT Problem List). Pt will benefit from skilled PT to increase their independence and safety with mobility to allow discharge to the venue listed below.         Recommendations for follow up therapy are one component of a multi-disciplinary discharge planning process, led by the attending physician.  Recommendations may be updated based on patient status, additional functional criteria and insurance authorization.  Follow Up Recommendations Home health PT      Assistance Recommended at Discharge Frequent or constant Supervision/Assistance  Patient can return home with the following  A little help with walking and/or transfers;A little help with bathing/dressing/bathroom;Help with stairs or ramp for entrance;Assistance with feeding;Assist for transportation;Assistance with cooking/housework    Equipment Recommendations None recommended by PT  Recommendations for Other Services       Functional Status Assessment Patient has had a recent decline in their functional status and demonstrates the ability to make significant improvements in function in a reasonable and predictable amount of  time.     Precautions / Restrictions Precautions Precautions: Fall Precaution Comments: monitor sats      Mobility  Bed Mobility               General bed mobility comments: OOB in chair    Transfers                        Ambulation/Gait Ambulation/Gait assistance: Min assist, +2 safety/equipment Gait Distance (Feet): 40 Feet (x 2) Assistive device: Rolling walker (2 wheels) Gait Pattern/deviations: Step-through pattern, Decreased step length - right, Shuffle Gait velocity: decr     General Gait Details: shuffling gait  Stairs            Wheelchair Mobility    Modified Rankin (Stroke Patients Only)       Balance Overall balance assessment: Needs assistance   Sitting balance-Leahy Scale: Good     Standing balance support: Bilateral upper extremity supported, During functional activity, Reliant on assistive device for balance Standing balance-Leahy Scale: Poor                               Pertinent Vitals/Pain Pain Assessment Pain Assessment: No/denies pain    Home Living Family/patient expects to be discharged to:: Private residence Living Arrangements: Spouse/significant other;Other relatives Available Help at Discharge: Available 24 hours/day;Other (Comment) (Has PCA 12 hours/day) Type of Home: House Home Access: Stairs to enter Entrance Stairs-Rails: Right Entrance Stairs-Number of Steps: 5   Home Layout: One level Home Equipment: Conservation officer, nature (2 wheels);Cane - single point;BSC/3in1;Shower seat;Grab bars - toilet;Grab bars - tub/shower;Hand held shower head;Wheelchair - manual;Hospital bed;Other (  comment) (hoyer)      Prior Function Prior Level of Function : Needs assist  Cognitive Assist : Mobility (cognitive);ADLs (cognitive) Mobility (Cognitive): Intermittent cues ADLs (Cognitive): Set up cues Physical Assist : ADLs (physical)   ADLs (physical): Bathing;Dressing;Toileting;IADLs Mobility Comments: per  caregiver has required more assistance over the last 2 months ADLs Comments: Pt states he is normally ableto bath/dress himself     Hand Dominance   Dominant Hand: Right    Extremity/Trunk Assessment   Upper Extremity Assessment Upper Extremity Assessment: Generalized weakness    Lower Extremity Assessment Lower Extremity Assessment: Overall WFL for tasks assessed    Cervical / Trunk Assessment Cervical / Trunk Assessment: Kyphotic  Communication   Communication: HOH  Cognition Arousal/Alertness: Awake/alert Behavior During Therapy: WFL for tasks assessed/performed Overall Cognitive Status: History of cognitive impairments - at baseline                                 General Comments: disucssed with caregiver/son        General Comments General comments (skin integrity, edema, etc.): cachexic    Exercises     Assessment/Plan    PT Assessment Patient needs continued PT services  PT Problem List Decreased strength;Decreased mobility;Decreased activity tolerance;Decreased knowledge of use of DME;Decreased knowledge of precautions;Decreased safety awareness       PT Treatment Interventions DME instruction;Therapeutic activities;Cognitive remediation;Gait training;Therapeutic exercise;Patient/family education;Functional mobility training;Balance training    PT Goals (Current goals can be found in the Care Plan section)  Acute Rehab PT Goals Patient Stated Goal: go home PT Goal Formulation: With patient/family Time For Goal Achievement: 03/31/22 Potential to Achieve Goals: Good    Frequency Min 3X/week     Co-evaluation PT/OT/SLP Co-Evaluation/Treatment: Yes Reason for Co-Treatment: For patient/therapist safety PT goals addressed during session: Mobility/safety with mobility OT goals addressed during session: ADL's and self-care       AM-PAC PT "6 Clicks" Mobility  Outcome Measure Help needed turning from your back to your side while in a  flat bed without using bedrails?: A Little Help needed moving from lying on your back to sitting on the side of a flat bed without using bedrails?: A Little Help needed moving to and from a bed to a chair (including a wheelchair)?: A Little Help needed standing up from a chair using your arms (e.g., wheelchair or bedside chair)?: A Little Help needed to walk in hospital room?: A Little Help needed climbing 3-5 steps with a railing? : Total 6 Click Score: 16    End of Session Equipment Utilized During Treatment: Gait belt;Oxygen Activity Tolerance: Patient tolerated treatment well Patient left: in chair;with nursing/sitter in room Nurse Communication: Mobility status PT Visit Diagnosis: Unsteadiness on feet (R26.81);Difficulty in walking, not elsewhere classified (R26.2)    Time: 1115-1140 PT Time Calculation (min) (ACUTE ONLY): 25 min   Charges:   PT Evaluation $PT Eval Low Complexity: [place] Office [phone] Weekend [phone]   Claretha Cooper 03/17/2022, 3:47 PM

## 2022-03-17 NOTE — Progress Notes (Signed)
Son Sim called and informed of bed move to 1237 since door to room needs to be fixed by maintenance after it broke.

## 2022-03-18 DIAGNOSIS — J9601 Acute respiratory failure with hypoxia: Secondary | ICD-10-CM | POA: Diagnosis not present

## 2022-03-18 LAB — BASIC METABOLIC PANEL
Anion gap: 7 (ref 5–15)
BUN: 39 mg/dL — ABNORMAL HIGH (ref 8–23)
CO2: 25 mmol/L (ref 22–32)
Calcium: 8.4 mg/dL — ABNORMAL LOW (ref 8.9–10.3)
Chloride: 108 mmol/L (ref 98–111)
Creatinine, Ser: 1.43 mg/dL — ABNORMAL HIGH (ref 0.61–1.24)
GFR, Estimated: 45 mL/min — ABNORMAL LOW (ref >60)
Glucose, Bld: 101 mg/dL — ABNORMAL HIGH (ref 70–99)
Potassium: 4.1 mmol/L (ref 3.5–5.1)
Sodium: 140 mmol/L (ref 135–145)

## 2022-03-18 LAB — CBC
HCT: 48.3 % (ref 39.0–52.0)
Hemoglobin: 12.8 g/dL — ABNORMAL LOW (ref 13.0–17.0)
MCH: 24.8 pg — ABNORMAL LOW (ref 26.0–34.0)
MCHC: 26.5 g/dL — ABNORMAL LOW (ref 30.0–36.0)
MCV: 93.6 fL (ref 80.0–100.0)
Platelets: 30 10*3/uL — ABNORMAL LOW (ref 150–400)
RBC: 5.16 MIL/uL (ref 4.22–5.81)
RDW: 22.7 % — ABNORMAL HIGH (ref 11.5–15.5)
WBC: 27 10*3/uL — ABNORMAL HIGH (ref 4.0–10.5)
nRBC: 5.2 % — ABNORMAL HIGH (ref 0.0–0.2)

## 2022-03-18 LAB — MAGNESIUM: Magnesium: 1.9 mg/dL (ref 1.7–2.4)

## 2022-03-18 LAB — PROCALCITONIN: Procalcitonin: 4.93 ng/mL

## 2022-03-18 MED ORDER — ACETAMINOPHEN 325 MG PO TABS
650.0000 mg | ORAL_TABLET | Freq: Four times a day (QID) | ORAL | Status: DC | PRN
  Administered 2022-03-18 – 2022-03-23 (×5): 650 mg via ORAL
  Filled 2022-03-18 (×4): qty 2

## 2022-03-18 MED ORDER — MELATONIN 3 MG PO TABS
3.0000 mg | ORAL_TABLET | Freq: Every day | ORAL | Status: DC
  Administered 2022-03-18: 3 mg via ORAL
  Filled 2022-03-18: qty 1

## 2022-03-18 MED ORDER — MUSCLE RUB 10-15 % EX CREA
TOPICAL_CREAM | CUTANEOUS | Status: DC | PRN
  Administered 2022-03-18: 1 via TOPICAL
  Filled 2022-03-18: qty 85

## 2022-03-18 MED ORDER — ACETAMINOPHEN 325 MG PO TABS
650.0000 mg | ORAL_TABLET | Freq: Once | ORAL | Status: AC
  Administered 2022-03-18: 650 mg via ORAL
  Filled 2022-03-18: qty 2

## 2022-03-18 NOTE — Progress Notes (Signed)
PROGRESS NOTE    BREWER HITCHMAN  LFY:[id number] DOB: 1924-07-16 DOA: 03/15/2022 PCP: [name], MD    Brief Narrative:   [name] [name] is a 88 y.o. male [employer] fast streamer, retired Therapist, music engineer/college professor with past medical history significant for polycythemia vera, chronic thrombocytopenia, DVT not on anticoagulation due to low platelet count, BPH, gout, anxiety/depression who presented to [facility] ED on 8/30 via EMS for acute onset shortness of breath.  Patient accompanied by one of his 24-hour caregivers.  He has been in his usual state of health but has had exposure to COVID about 2 weeks ago.  Patient also with productive cough.  Denies any other sick contacts, no changes in his home medications.  In the ED, temperature 98.2 F, HR 115, RR 27, BP 88/61, SPO2 90% on 5 L nasal cannula.  WBC 23.8, hemoglobin 14.9, platelets 20.  Sodium 142, potassium 4.6, chloride 108, CO2 25, glucose 144, BUN 31, creatinine 1.58.  Total bilirubin 1.9.  Lactic acid 2.4.  Procalcitonin 1.24.  BNP 559.1.  COVID-19 PCR negative.  Influenza A/B PCR negative.  Chest x-ray with low lung volumes with mild atelectasis lung bases.  CT angiogram chest with extensive airway filling in the lower lungs with bilateral atelectasis and pneumonia, negative for pulmonary embolism.  TRH consulted for further evaluation management of acute hypoxic respite failure secondary to community-acquired pneumonia with associated hypotension.  Assessment & Plan:   Acute respiratory failure with hypoxia, POA Septic shock secondary to community-acquired pneumonia POA Patient presenting to the ED with acute onset shortness of breath.  Noted to have elevated WBC count of 23.8, procalcitonin of 1.24 with a lactic acid of 2.4.  COVID-19 PCR/influenza A/B PCR negative.  CT angiogram chest negative for PE but with extensive airway filling lower lobes consistent with pneumonia. --PCCM following, appreciate assistance --WBC  23.8>45.5>28.2>27.0 --PCT 1.24>8.63>4.93 --Blood cultures x 2: no growth x 2 days --Sputum culture: Ordered, but not collected --Azithromycin '500mg'$  IV daily --Ceftriaxone 1g IV q24h --Continue supplemental oxygen, maintain SPO2 greater than 92%, on 6L [place] (not oxygen dependent at baseline) --CBC daily, trend procalcitonin  Septic shock: resolved Etiology likely secondary to infectious process as above.  Was initially placed on midodrine and somewhat responsive to IV fluid resuscitation but since blood pressure has continued to deteriorate and was placed on a vasopressor chair. --PCCM signed off 9/1 --Titrate off of phenylephrine drip --BP now stable  Polycythemia vera Follows with hematology outpatient, Dr. Burr Medico.  Was previously on Hydrea and underwent intermittent phlebotomies but which are now on hold.  Continue outpatient follow-up with hematology.  Thrombocytopenia, chronic Platelet count of 20, stable.  No active bleeding.  Follows with hematology outpatient. --Platelet transfusion for platelet count less than 10 or active bleeding --CBC daily  Anxiety/depression --Lexapro 5 mg p.o. nightly  BPH: Hold home Hytrin for now  Gout: Hold home allopurinol for now  Weakness/deconditioning/debility: Patient currently lives in a home residence, has 24-hour caregivers and mainly lives on the first floor. --PT/OT evaluation pending   DVT prophylaxis: Place and maintain sequential compression device Start: 03/15/22 1111    Code Status: Full Code Family Communication: Updated patient's son, Sim at bedside yesterday afternoon, no family present this morning  Disposition Plan:  Level of care: Telemetry Status is: Inpatient Remains inpatient appropriate because: IV antibiotics, requiring high levels of supplemental oxygen    Consultants:  PCCM - signed off 9/1  Procedures:  Vascular duplex ultrasound bilateral lower extremities  Antimicrobials:  Meropenem  8/30 -  8/30 Azithromycin 8/30>> Ceftriaxone 8/30>>   Subjective: Patient seen examined bedside, lying in bed.  No family present.  No complaints this morning.  Blood pressure now remained stable.  Oxygen now titrated down to 6 L nasal cannula. Discussed with RN.  No other questions or concerns at this time.  Patient denies headache, no dizziness, no fever/chills/night sweats, no nausea/vomiting/diarrhea, no chest pain, no palpitations, no abdominal pain.  No acute events reported overnight by nursing staff.  Stable for transfer to telemetry floor   Objective: Vitals:   03/18/22 0700 03/18/22 0800 03/18/22 0821 03/18/22 0822  BP: 124/63 134/78    Pulse: 70 75    Resp: 15 18    Temp:    98.2 F (36.8 C)  TempSrc:    Oral  SpO2: 93% 93% 93%   Weight:      Height:        Intake/Output Summary (Last 24 hours) at 03/18/2022 1049 Last data filed at 03/18/2022 0600 Gross per 24 hour  Intake --  Output 1050 ml  Net -1050 ml   Filed Weights   03/15/22 0705  Weight: 67.9 kg    Examination:  Physical Exam: GEN: NAD, alert and oriented x3, elderly/ill/weak in appearance HEENT: NCAT, PERRL, EOMI, sclera clear, MMM PULM: Sounds diminished bilateral bases with crackles, normal respiratory effort without accessory muscle use, no wheezing, on 6 L [place] with SPO2 96% at rest CV: RRR w/o M/G/R GI: abd soft, NTND, NABS, no R/G/M MSK: no peripheral edema, moves all extremities independently Integumentary: Chronic hyperpigmentation to skin anterior shins, multiple areas of ecchymosis to bilateral upper/lower extremities in various stages of healing.  Otherwise no other concerning rashes/lesions/wounds on exposed skin.     Data Reviewed: I have personally reviewed following labs and imaging studies  CBC: Recent Labs  Lab 03/15/22 0236 03/16/22 0329 03/17/22 0248 03/18/22 0311  WBC 23.8* 45.5* 28.2* 27.0*  NEUTROABS 18.4*  --   --   --   HGB 14.9 13.3 13.0 12.8*  HCT 54.3* 46.5 47.0 48.3  MCV  88.7 87.2 88.7 93.6  PLT 20* 20* 20* 30*   Basic Metabolic Panel: Recent Labs  Lab 03/15/22 0236 03/16/22 0329 03/17/22 0248 03/18/22 0311  NA 142 139 141 140  K 4.6 4.6 4.3 4.1  CL 108 108 110 108  CO2 '25 23 22 25  '$ GLUCOSE 144* 94 71 101*  BUN 31* 46* 46* 39*  CREATININE 1.58* 1.61* 1.56* 1.43*  CALCIUM 9.0 8.5* 8.4* 8.4*  MG  --  2.0  --  1.9   GFR: Estimated Creatinine Clearance: 29.7 mL/min (A) (by C-G formula based on SCr of 1.43 mg/dL (H)). Liver Function Tests: Recent Labs  Lab 03/15/22 0236  AST 21  ALT 14  ALKPHOS 73  BILITOT 1.9*  PROT 5.9*  ALBUMIN 3.6   No results for input(s): "LIPASE", "AMYLASE" in the last 168 hours. No results for input(s): "AMMONIA" in the last 168 hours. Coagulation Profile: No results for input(s): "INR", "PROTIME" in the last 168 hours. Cardiac Enzymes: No results for input(s): "CKTOTAL", "CKMB", "CKMBINDEX", "TROPONINI" in the last 168 hours. BNP (last 3 results) No results for input(s): "PROBNP" in the last 8760 hours. HbA1C: No results for input(s): "HGBA1C" in the last 72 hours. CBG: No results for input(s): "GLUCAP" in the last 168 hours. Lipid Profile: No results for input(s): "CHOL", "HDL", "LDLCALC", "TRIG", "CHOLHDL", "LDLDIRECT" in the last 72 hours. Thyroid Function Tests: No results for input(s): "TSH", "  T4TOTAL", "FREET4", "T3FREE", "THYROIDAB" in the last 72 hours. Anemia Panel: No results for input(s): "VITAMINB12", "FOLATE", "FERRITIN", "TIBC", "IRON", "RETICCTPCT" in the last 72 hours. Sepsis Labs: Recent Labs  Lab 03/15/22 0719 03/15/22 0920 03/16/22 0329 03/17/22 0248 03/18/22 0311  PROCALCITON 1.24  --   --  8.63 4.93  LATICACIDVEN 3.1* 2.4* 1.4  --   --     Recent Results (from the past 240 hour(s))  Resp Panel by RT-PCR (Flu A&B, Covid) Anterior Nasal Swab     Status: None   Collection Time: 03/15/22  2:42 AM   Specimen: Anterior Nasal Swab  Result Value Ref Range Status   SARS Coronavirus 2  by RT PCR NEGATIVE NEGATIVE Final    Comment: (NOTE) SARS-CoV-2 target nucleic acids are NOT DETECTED.  The SARS-CoV-2 RNA is generally detectable in upper respiratory specimens during the acute phase of infection. The lowest concentration of SARS-CoV-2 viral copies this assay can detect is 138 copies/mL. A negative result does not preclude SARS-Cov-2 infection and should not be used as the sole basis for treatment or other patient management decisions. A negative result may occur with  improper specimen collection/handling, submission of specimen other than nasopharyngeal swab, presence of viral mutation(s) within the areas targeted by this assay, and inadequate number of viral copies(<138 copies/mL). A negative result must be combined with clinical observations, patient history, and epidemiological information. The expected result is Negative.  Fact Sheet for Patients:  EntrepreneurPulse.com.au  Fact Sheet for Healthcare Providers:  IncredibleEmployment.be  This test is no t yet approved or cleared by the [place] FDA and  has been authorized for detection and/or diagnosis of SARS-CoV-2 by FDA under an Emergency Use Authorization (EUA). This EUA will remain  in effect (meaning this test can be used) for the duration of the COVID-19 declaration under Section 564(b)(1) of the Act, 21 U.S.C.section 360bbb-3(b)(1), unless the authorization is terminated  or revoked sooner.       Influenza A by PCR NEGATIVE NEGATIVE Final   Influenza B by PCR NEGATIVE NEGATIVE Final    Comment: (NOTE) The Xpert Xpress SARS-CoV-2/FLU/RSV plus assay is intended as an aid in the diagnosis of influenza from Nasopharyngeal swab specimens and should not be used as a sole basis for treatment. Nasal washings and aspirates are unacceptable for Xpert Xpress SARS-CoV-2/FLU/RSV testing.  Fact Sheet for Patients: EntrepreneurPulse.com.au  Fact  Sheet for Healthcare Providers: IncredibleEmployment.be  This test is not yet approved or cleared by the [place] FDA and has been authorized for detection and/or diagnosis of SARS-CoV-2 by FDA under an Emergency Use Authorization (EUA). This EUA will remain in effect (meaning this test can be used) for the duration of the COVID-19 declaration under Section 564(b)(1) of the Act, 21 U.S.C. section 360bbb-3(b)(1), unless the authorization is terminated or revoked.  Performed at [facility], [place] [address], [place], [place] [address]   Group A Strep by PCR     Status: None   Collection Time: 03/15/22  3:43 AM   Specimen: Throat; Sterile Swab  Result Value Ref Range Status   Group A Strep by PCR NOT DETECTED NOT DETECTED Final    Comment: Performed at [facility], [place] [address], [place], [place] [address]  MRSA Next Gen by PCR, Nasal     Status: None   Collection Time: 03/15/22  7:08 AM   Specimen: Nasal Mucosa; Nasal Swab  Result Value Ref Range Status   MRSA by PCR Next Gen NOT  DETECTED NOT DETECTED Final    Comment: (NOTE) The GeneXpert MRSA Assay (FDA approved for NASAL specimens only), is one component of a comprehensive MRSA colonization surveillance program. It is not intended to diagnose MRSA infection nor to guide or monitor treatment for MRSA infections. Test performance is not FDA approved in patients less than 85 years old. Performed at [facility], [place] [address], [place], [place] [address]   Culture, blood (Routine X 2) w Reflex to ID Panel     Status: None (Preliminary result)   Collection Time: 03/15/22  7:19 AM   Specimen: BLOOD  Result Value Ref Range Status   Specimen Description   Final    BLOOD BLOOD LEFT FOREARM Performed at [facility] [address] [place], [place] [address]    Special Requests   Final    BOTTLES DRAWN AEROBIC ONLY Blood Culture  adequate volume Performed at [facility] [address], [place], [place] [address]    Culture   Final    NO GROWTH 2 DAYS Performed at [facility] [address], [place], [place] [address]    Report Status PENDING  Incomplete  Culture, blood (Routine X 2) w Reflex to ID Panel     Status: None (Preliminary result)   Collection Time: 03/15/22  7:19 AM   Specimen: BLOOD  Result Value Ref Range Status   Specimen Description   Final    BLOOD LEFT HAND Performed at [facility] [address], [place], [place] [address]    Special Requests   Final    BOTTLES DRAWN AEROBIC ONLY Blood Culture adequate volume Performed at [facility] [address] [place],  [address]    Culture   Final    NO GROWTH 2 DAYS Performed at [facility] [address] [place],  [address]    Report Status PENDING  Incomplete         Radiology Studies: DG CHEST PORT 1 VIEW  Result Date: 03/17/2022 CLINICAL DATA:  Shortness of breath. EXAM: PORTABLE CHEST 1 VIEW COMPARISON:  March 15, 2022 FINDINGS: Stable cardiac and mediastinal contours. Low lung volumes. Interval increase in bilateral interstitial pulmonary opacities. Possible trace right pleural effusion. No pneumothorax. Thoracic spine degenerative changes. IMPRESSION: Interval increase in bilateral airspace opacities suggestive of multifocal infection. Possible small right pleural effusion. Electronically Signed   By: Lovey Newcomer M.D.   On: 03/17/2022 07:48        Scheduled Meds:  bethanechol  10 mg Oral TID   Chlorhexidine Gluconate Cloth  6 each Topical Q0600   escitalopram  5 mg Oral QHS   ipratropium-albuterol  3 mL Nebulization BID   melatonin  3 mg Oral QHS   Continuous Infusions:  sodium chloride 10 mL/hr at 03/16/22 1141   azithromycin 500 mg (03/18/22 1015)   cefTRIAXone (ROCEPHIN)  IV Stopped (03/17/22 1144)     LOS: 3 days    Time  spent: 51 minutes spent on chart review, discussion with nursing staff, consultants, updating family and interview/physical exam; more than 50% of that time was spent in counseling and/or coordination of care.     Teddy Pena J [place] DO [facility] Available via Epic secure chat 7am-7pm After these hours, please refer to coverage provider listed on [email or web address] 03/18/2022, 10:49 AM

## 2022-03-19 DIAGNOSIS — J9601 Acute respiratory failure with hypoxia: Secondary | ICD-10-CM | POA: Diagnosis not present

## 2022-03-19 LAB — CBC
HCT: 46.8 % (ref 39.0–52.0)
Hemoglobin: 13.2 g/dL (ref 13.0–17.0)
MCH: 24.7 pg — ABNORMAL LOW (ref 26.0–34.0)
MCHC: 28.2 g/dL — ABNORMAL LOW (ref 30.0–36.0)
MCV: 87.6 fL (ref 80.0–100.0)
Platelets: 16 10*3/uL — CL (ref 150–400)
RBC: 5.34 MIL/uL (ref 4.22–5.81)
RDW: 22.2 % — ABNORMAL HIGH (ref 11.5–15.5)
WBC: 20.1 10*3/uL — ABNORMAL HIGH (ref 4.0–10.5)
nRBC: 7.4 % — ABNORMAL HIGH (ref 0.0–0.2)

## 2022-03-19 LAB — BASIC METABOLIC PANEL
Anion gap: 8 (ref 5–15)
BUN: 31 mg/dL — ABNORMAL HIGH (ref 8–23)
CO2: 24 mmol/L (ref 22–32)
Calcium: 8.4 mg/dL — ABNORMAL LOW (ref 8.9–10.3)
Chloride: 107 mmol/L (ref 98–111)
Creatinine, Ser: 1.3 mg/dL — ABNORMAL HIGH (ref 0.61–1.24)
GFR, Estimated: 51 mL/min — ABNORMAL LOW (ref >60)
Glucose, Bld: 94 mg/dL (ref 70–99)
Potassium: 3.8 mmol/L (ref 3.5–5.1)
Sodium: 139 mmol/L (ref 135–145)

## 2022-03-19 LAB — PROCALCITONIN: Procalcitonin: 2.56 ng/mL

## 2022-03-19 MED ORDER — HALOPERIDOL LACTATE 5 MG/ML IJ SOLN
1.0000 mg | Freq: Four times a day (QID) | INTRAMUSCULAR | Status: DC | PRN
  Administered 2022-03-19 – 2022-03-21 (×2): 1 mg via INTRAVENOUS
  Filled 2022-03-19 (×2): qty 1

## 2022-03-19 MED ORDER — MELATONIN 3 MG PO TABS
6.0000 mg | ORAL_TABLET | Freq: Every day | ORAL | Status: DC
  Administered 2022-03-19 – 2022-03-22 (×4): 6 mg via ORAL
  Filled 2022-03-19 (×4): qty 2

## 2022-03-19 NOTE — Progress Notes (Signed)
PROGRESS NOTE    [name]  PXT:[id number] DOB: [birth year] DOA: 03/15/2022 PCP: [name], MD    Brief Narrative:   [name] [name] is a 89 y.o. male [employer], retired Therapist, music engineer/college professor with past medical history significant for polycythemia vera, chronic thrombocytopenia, DVT not on anticoagulation due to low platelet count, BPH, gout, anxiety/depression who presented to [facility] ED on 8/30 via EMS for acute onset shortness of breath.  Patient accompanied by one of his 24-hour caregivers.  He has been in his usual state of health but has had exposure to COVID about 2 weeks ago.  Patient also with productive cough.  Denies any other sick contacts, no changes in his home medications.  In the ED, temperature 98.2 F, HR 115, RR 27, BP 88/61, SPO2 90% on 5 L nasal cannula.  WBC 23.8, hemoglobin 14.9, platelets 20.  Sodium 142, potassium 4.6, chloride 108, CO2 25, glucose 144, BUN 31, creatinine 1.58.  Total bilirubin 1.9.  Lactic acid 2.4.  Procalcitonin 1.24.  BNP 559.1.  COVID-19 PCR negative.  Influenza A/B PCR negative.  Chest x-ray with low lung volumes with mild atelectasis lung bases.  CT angiogram chest with extensive airway filling in the lower lungs with bilateral atelectasis and pneumonia, negative for pulmonary embolism.  TRH consulted for further evaluation management of acute hypoxic respite failure secondary to community-acquired pneumonia with associated hypotension.  Assessment & Plan:   Acute respiratory failure with hypoxia, POA Septic shock secondary to community-acquired pneumonia POA Patient presenting to the ED with acute onset shortness of breath.  Noted to have elevated WBC count of 23.8, procalcitonin of 1.24 with a lactic acid of 2.4.  COVID-19 PCR/influenza A/B PCR negative.  CT angiogram chest negative for PE but with extensive airway filling lower lobes consistent with pneumonia. --PCCM following, appreciate assistance --WBC  23.8>45.5>28.2>27.0>20.1 --PCT 1.24>8.63>4.93>2.56 --Blood cultures x 2: no growth x 3 days --Sputum culture: Ordered, but not collected --Azithromycin '500mg'$  IV daily, Day #5/5 --Ceftriaxone 1g IV q24h, Day # 5/7 --Continue supplemental oxygen, maintain SPO2 greater than 92%, on 2L [place] (not oxygen dependent at baseline) --CBC daily, trend procalcitonin --Ambulatory O2 screen tomorrow  Septic shock: resolved Etiology likely secondary to infectious process as above.  Was initially placed on midodrine and somewhat responsive to IV fluid resuscitation but since blood pressure has continued to deteriorate and was placed on a vasopressor chair. --PCCM signed off 9/1 --Titrate off of phenylephrine drip --BP now stable  Polycythemia vera Follows with hematology outpatient, Dr. Burr Medico.  Was previously on Hydrea and underwent intermittent phlebotomies but which are now on hold.  Continue outpatient follow-up with hematology.  Thrombocytopenia, chronic Platelet count of 20, stable.  No active bleeding.  Follows with hematology outpatient. --Platelet transfusion for platelet count less than 10 or active bleeding --CBC daily  Anxiety/depression --Lexapro 5 mg p.o. nightly  BPH: Hold home Hytrin for now  Acute urinary retention Continue Foley catheter, will attempt voiding trial tomorrow.  Gout: Hold home allopurinol for now  Weakness/deconditioning/debility: Patient currently lives in a home residence, has 24-hour caregivers and mainly lives on the first floor.  PT/OT recommends home health on discharge. -- Continue therapy while inpatient.   DVT prophylaxis: Place and maintain sequential compression device Start: 03/15/22 1111    Code Status: Full Code Family Communication: No family present at bedside this morning  Disposition Plan:  Level of care: Telemetry Status is: Inpatient Remains inpatient appropriate because: IV antibiotics, needs titration off of supplemental  oxygen     Consultants:  PCCM - signed off 9/1  Procedures:  Vascular duplex ultrasound bilateral lower extremities  Antimicrobials:  Meropenem 8/30 - 8/30 Azithromycin 8/30>> Ceftriaxone 8/30>>   Subjective: Patient seen examined bedside, lying in bed.  No family present.  No complaints this morning.  Blood pressure now remained stable.  Oxygen now titrated down to 6 L nasal cannula. Discussed with RN.  No other questions or concerns at this time.  Patient denies headache, no dizziness, no fever/chills/night sweats, no nausea/vomiting/diarrhea, no chest pain, no palpitations, no abdominal pain.  No acute events reported overnight by nursing staff.  Stable for transfer to telemetry floor   Objective: Vitals:   03/19/22 0145 03/19/22 0400 03/19/22 0802 03/19/22 0805  BP:   129/71   Pulse:   72   Resp:   15   Temp: 98.2 F (36.8 C) 98 F (36.7 C)  98 F (36.7 C)  TempSrc: Oral Oral  Oral  SpO2:   97%   Weight:      Height:        Intake/Output Summary (Last 24 hours) at 03/19/2022 0943 Last data filed at 03/18/2022 1600 Gross per 24 hour  Intake --  Output 600 ml  Net -600 ml   Filed Weights   03/15/22 0705  Weight: 67.9 kg    Examination:  Physical Exam: GEN: NAD, alert and oriented x3, elderly/ill/weak in appearance HEENT: NCAT, PERRL, EOMI, sclera clear, MMM PULM: Sounds diminished bilateral bases with crackles, normal respiratory effort without accessory muscle use, no wheezing, on 2 L [place] with SPO2 93% at rest CV: RRR w/o M/G/R GI: abd soft, NTND, NABS, no R/G/M GU: Foley catheter noted, draining clear yellow urine in collection bag MSK: no peripheral edema, moves all extremities independently Integumentary: Chronic hyperpigmentation to skin anterior shins, multiple areas of ecchymosis to bilateral upper/lower extremities in various stages of healing.  Otherwise no other concerning rashes/lesions/wounds on exposed skin.     Data Reviewed: I have personally reviewed  following labs and imaging studies  CBC: Recent Labs  Lab 03/15/22 0236 03/16/22 0329 03/17/22 0248 03/18/22 0311 03/19/22 0312  WBC 23.8* 45.5* 28.2* 27.0* 20.1*  NEUTROABS 18.4*  --   --   --   --   HGB 14.9 13.3 13.0 12.8* 13.2  HCT 54.3* 46.5 47.0 48.3 46.8  MCV 88.7 87.2 88.7 93.6 87.6  PLT 20* 20* 20* 30* 16*   Basic Metabolic Panel: Recent Labs  Lab 03/15/22 0236 03/16/22 0329 03/17/22 0248 03/18/22 0311 03/19/22 0312  NA 142 139 141 140 139  K 4.6 4.6 4.3 4.1 3.8  CL 108 108 110 108 107  CO2 '25 23 22 25 24  '$ GLUCOSE 144* 94 71 101* 94  BUN 31* 46* 46* 39* 31*  CREATININE 1.58* 1.61* 1.56* 1.43* 1.30*  CALCIUM 9.0 8.5* 8.4* 8.4* 8.4*  MG  --  2.0  --  1.9  --    GFR: Estimated Creatinine Clearance: 32.6 mL/min (A) (by C-G formula based on SCr of 1.3 mg/dL (H)). Liver Function Tests: Recent Labs  Lab 03/15/22 0236  AST 21  ALT 14  ALKPHOS 73  BILITOT 1.9*  PROT 5.9*  ALBUMIN 3.6   No results for input(s): "LIPASE", "AMYLASE" in the last 168 hours. No results for input(s): "AMMONIA" in the last 168 hours. Coagulation Profile: No results for input(s): "INR", "PROTIME" in the last 168 hours. Cardiac Enzymes: No results for input(s): "CKTOTAL", "CKMB", "CKMBINDEX", "TROPONINI" in the  last 168 hours. BNP (last 3 results) No results for input(s): "PROBNP" in the last 8760 hours. HbA1C: No results for input(s): "HGBA1C" in the last 72 hours. CBG: No results for input(s): "GLUCAP" in the last 168 hours. Lipid Profile: No results for input(s): "CHOL", "HDL", "LDLCALC", "TRIG", "CHOLHDL", "LDLDIRECT" in the last 72 hours. Thyroid Function Tests: No results for input(s): "TSH", "T4TOTAL", "FREET4", "T3FREE", "THYROIDAB" in the last 72 hours. Anemia Panel: No results for input(s): "VITAMINB12", "FOLATE", "FERRITIN", "TIBC", "IRON", "RETICCTPCT" in the last 72 hours. Sepsis Labs: Recent Labs  Lab 03/15/22 0719 03/15/22 0920 03/16/22 0329 03/17/22 0248  03/18/22 0311 03/19/22 0312  PROCALCITON 1.24  --   --  8.63 4.93 2.56  LATICACIDVEN 3.1* 2.4* 1.4  --   --   --     Recent Results (from the past 240 hour(s))  Resp Panel by RT-PCR (Flu A&B, Covid) Anterior Nasal Swab     Status: None   Collection Time: 03/15/22  2:42 AM   Specimen: Anterior Nasal Swab  Result Value Ref Range Status   SARS Coronavirus 2 by RT PCR NEGATIVE NEGATIVE Final    Comment: (NOTE) SARS-CoV-2 target nucleic acids are NOT DETECTED.  The SARS-CoV-2 RNA is generally detectable in upper respiratory specimens during the acute phase of infection. The lowest concentration of SARS-CoV-2 viral copies this assay can detect is 138 copies/mL. A negative result does not preclude SARS-Cov-2 infection and should not be used as the sole basis for treatment or other patient management decisions. A negative result may occur with  improper specimen collection/handling, submission of specimen other than nasopharyngeal swab, presence of viral mutation(s) within the areas targeted by this assay, and inadequate number of viral copies(<138 copies/mL). A negative result must be combined with clinical observations, patient history, and epidemiological information. The expected result is Negative.  Fact Sheet for Patients:  EntrepreneurPulse.com.au  Fact Sheet for Healthcare Providers:  IncredibleEmployment.be  This test is no t yet approved or cleared by the [place] FDA and  has been authorized for detection and/or diagnosis of SARS-CoV-2 by FDA under an Emergency Use Authorization (EUA). This EUA will remain  in effect (meaning this test can be used) for the duration of the COVID-19 declaration under Section 564(b)(1) of the Act, 21 U.S.C.section 360bbb-3(b)(1), unless the authorization is terminated  or revoked sooner.       Influenza A by PCR NEGATIVE NEGATIVE Final   Influenza B by PCR NEGATIVE NEGATIVE Final    Comment:  (NOTE) The Xpert Xpress SARS-CoV-2/FLU/RSV plus assay is intended as an aid in the diagnosis of influenza from Nasopharyngeal swab specimens and should not be used as a sole basis for treatment. Nasal washings and aspirates are unacceptable for Xpert Xpress SARS-CoV-2/FLU/RSV testing.  Fact Sheet for Patients: EntrepreneurPulse.com.au  Fact Sheet for Healthcare Providers: IncredibleEmployment.be  This test is not yet approved or cleared by the [place] FDA and has been authorized for detection and/or diagnosis of SARS-CoV-2 by FDA under an Emergency Use Authorization (EUA). This EUA will remain in effect (meaning this test can be used) for the duration of the COVID-19 declaration under Section 564(b)(1) of the Act, 21 U.S.C. section 360bbb-3(b)(1), unless the authorization is terminated or revoked.  Performed at [facility], [place] [address], [place], [place] [address]   Group A Strep by PCR     Status: None   Collection Time: 03/15/22  3:43 AM   Specimen: Throat; Sterile Swab  Result Value Ref Range Status  Group A Strep by PCR NOT DETECTED NOT DETECTED Final    Comment: Performed at [facility], [place] [address], [place], [place] [address]  MRSA Next Gen by PCR, Nasal     Status: None   Collection Time: 03/15/22  7:08 AM   Specimen: Nasal Mucosa; Nasal Swab  Result Value Ref Range Status   MRSA by PCR Next Gen NOT DETECTED NOT DETECTED Final    Comment: (NOTE) The GeneXpert MRSA Assay (FDA approved for NASAL specimens only), is one component of a comprehensive MRSA colonization surveillance program. It is not intended to diagnose MRSA infection nor to guide or monitor treatment for MRSA infections. Test performance is not FDA approved in patients less than 47 years old. Performed at [facility], [place] [address], [place], [place] [address]   Culture, blood (Routine X 2) w  Reflex to ID Panel     Status: None (Preliminary result)   Collection Time: 03/15/22  7:19 AM   Specimen: BLOOD  Result Value Ref Range Status   Specimen Description   Final    BLOOD BLOOD LEFT FOREARM Performed at [facility] [address], [place], [place] [address]    Special Requests   Final    BOTTLES DRAWN AEROBIC ONLY Blood Culture adequate volume Performed at [place] [address] [place], [place] [address]    Culture   Final    NO GROWTH 3 DAYS Performed at [facility], [place] [address] [place], [place] [address]    Report Status PENDING  Incomplete  Culture, blood (Routine X 2) w Reflex to ID Panel     Status: None (Preliminary result)   Collection Time: 03/15/22  7:19 AM   Specimen: BLOOD LEFT HAND  Result Value Ref Range Status   Specimen Description   Final    BLOOD LEFT HAND Performed at [facility] [address] [place], [place] [address]    Special Requests   Final    BOTTLES DRAWN AEROBIC ONLY Blood Culture adequate volume Performed at [facility] [address], [place], [place] [address]    Culture   Final    NO GROWTH 3 DAYS Performed at [facility], [place] [address] [place], [place] [address]    Report Status PENDING  Incomplete         Radiology Studies: No results found.      Scheduled Meds:  bethanechol  10 mg Oral TID   Chlorhexidine Gluconate Cloth  6 each Topical Q0600   escitalopram  5 mg Oral QHS   ipratropium-albuterol  3 mL Nebulization BID   melatonin  6 mg Oral QHS   Continuous Infusions:  sodium chloride 10 mL/hr at 03/16/22 1141   azithromycin Stopped (03/18/22 1115)   cefTRIAXone (ROCEPHIN)  IV Stopped (03/18/22 1208)     LOS: 4 days    Time spent: 49 minutes spent on chart review, discussion with nursing staff, consultants, updating family and interview/physical exam; more than 50% of that time was spent in  counseling and/or coordination of care.     Lacy Sofia J [place] DO [facility] Available via Epic secure chat 7am-7pm After these hours, please refer to coverage provider listed on [email or web address] 03/19/2022, 9:43 AM

## 2022-03-19 NOTE — Progress Notes (Signed)
This RN gave PRN ativan, after one hour patient noted to be more confused, not using call bell (was using before) and attempting to get out of bed without assistance. This RN made sure call bell was in reach and explained to patient how to use. Bed Exit on. Notified RN, Eliberto Ivory.

## 2022-03-19 NOTE — Progress Notes (Signed)
Chaplain introduced spiritual care services to [name] [name] and later to his son, [name] [name]. [name] [name] shared about some of his life story and about his time here in the hospital.  He seemed to appreciate the companionship until his son arrived.  [address] [place] Pager, [phone]

## 2022-03-20 DIAGNOSIS — J9601 Acute respiratory failure with hypoxia: Secondary | ICD-10-CM | POA: Diagnosis not present

## 2022-03-20 LAB — BASIC METABOLIC PANEL
Anion gap: 7 (ref 5–15)
BUN: 29 mg/dL — ABNORMAL HIGH (ref 8–23)
CO2: 26 mmol/L (ref 22–32)
Calcium: 8.7 mg/dL — ABNORMAL LOW (ref 8.9–10.3)
Chloride: 106 mmol/L (ref 98–111)
Creatinine, Ser: 1.2 mg/dL (ref 0.61–1.24)
GFR, Estimated: 56 mL/min — ABNORMAL LOW (ref >60)
Glucose, Bld: 107 mg/dL — ABNORMAL HIGH (ref 70–99)
Potassium: 3.7 mmol/L (ref 3.5–5.1)
Sodium: 139 mmol/L (ref 135–145)

## 2022-03-20 LAB — PROCALCITONIN: Procalcitonin: 1.39 ng/mL

## 2022-03-20 LAB — CBC
HCT: 51.5 % (ref 39.0–52.0)
Hemoglobin: 14.6 g/dL (ref 13.0–17.0)
MCH: 24.7 pg — ABNORMAL LOW (ref 26.0–34.0)
MCHC: 28.3 g/dL — ABNORMAL LOW (ref 30.0–36.0)
MCV: 87 fL (ref 80.0–100.0)
Platelets: 14 10*3/uL — CL (ref 150–400)
RBC: 5.92 MIL/uL — ABNORMAL HIGH (ref 4.22–5.81)
RDW: 22.5 % — ABNORMAL HIGH (ref 11.5–15.5)
WBC: 24.7 10*3/uL — ABNORMAL HIGH (ref 4.0–10.5)
nRBC: 8.3 % — ABNORMAL HIGH (ref 0.0–0.2)

## 2022-03-20 LAB — CULTURE, BLOOD (ROUTINE X 2)
Culture: NO GROWTH
Culture: NO GROWTH
Special Requests: ADEQUATE
Special Requests: ADEQUATE

## 2022-03-20 MED ORDER — ZINC OXIDE 40 % EX OINT
TOPICAL_OINTMENT | Freq: Four times a day (QID) | CUTANEOUS | Status: DC | PRN
Start: 2022-03-20 — End: 2022-03-24

## 2022-03-20 MED ORDER — IPRATROPIUM-ALBUTEROL 0.5-2.5 (3) MG/3ML IN SOLN: 3.0000 mL | RESPIRATORY_TRACT | Status: DC | PRN

## 2022-03-20 MED ORDER — SENNOSIDES-DOCUSATE SODIUM 8.6-50 MG PO TABS
1.0000 | ORAL_TABLET | Freq: Two times a day (BID) | ORAL | Status: DC
  Administered 2022-03-21 – 2022-03-23 (×5): 1 via ORAL
  Filled 2022-03-20 (×6): qty 1

## 2022-03-20 MED ORDER — ZIPRASIDONE MESYLATE 20 MG IM SOLR
10.0000 mg | Freq: Once | INTRAMUSCULAR | Status: AC
  Administered 2022-03-20: 10 mg via INTRAMUSCULAR
  Filled 2022-03-20: qty 20

## 2022-03-20 MED ORDER — QUETIAPINE FUMARATE 25 MG PO TABS
12.5000 mg | ORAL_TABLET | Freq: Every day | ORAL | Status: DC
Start: 2022-03-20 — End: 2022-03-22
  Administered 2022-03-20 – 2022-03-21 (×2): 12.5 mg via ORAL
  Filled 2022-03-20 (×2): qty 1

## 2022-03-20 MED ORDER — POLYETHYLENE GLYCOL 3350 17 G PO PACK
17.0000 g | PACK | Freq: Every day | ORAL | Status: DC | PRN
Start: 2022-03-20 — End: 2022-03-24

## 2022-03-20 NOTE — Progress Notes (Addendum)
PROGRESS NOTE    [name] [name]  YQM:[id number] DOB: July 31, 1923 DOA: 03/15/2022 PCP: [name], MD    Brief Narrative:   [name] [name] is a 88 y.o. male [employer] fast streamer, retired Therapist, music engineer/college professor with past medical history significant for polycythemia vera, chronic thrombocytopenia, DVT not on anticoagulation due to low platelet count, BPH, gout, anxiety/depression who presented to [facility] ED on 8/30 via EMS for acute onset shortness of breath.  Patient accompanied by one of his 24-hour caregivers.  He has been in his usual state of health but has had exposure to COVID about 2 weeks ago.  Patient also with productive cough.  Denies any other sick contacts, no changes in his home medications.  In the ED, temperature 98.2 F, HR 115, RR 27, BP 88/61, SPO2 90% on 5 L nasal cannula.  WBC 23.8, hemoglobin 14.9, platelets 20.  Sodium 142, potassium 4.6, chloride 108, CO2 25, glucose 144, BUN 31, creatinine 1.58.  Total bilirubin 1.9.  Lactic acid 2.4.  Procalcitonin 1.24.  BNP 559.1.  COVID-19 PCR negative.  Influenza A/B PCR negative.  Chest x-ray with low lung volumes with mild atelectasis lung bases.  CT angiogram chest with extensive airway filling in the lower lungs with bilateral atelectasis and pneumonia, negative for pulmonary embolism.  TRH consulted for further evaluation management of acute hypoxic respite failure secondary to community-acquired pneumonia with associated hypotension.  Assessment & Plan:   Acute respiratory failure with hypoxia, POA Septic shock secondary to community-acquired pneumonia POA Patient presenting to the ED with acute onset shortness of breath.  Noted to have elevated WBC count of 23.8, procalcitonin of 1.24 with a lactic acid of 2.4.  COVID-19 PCR/influenza A/B PCR negative.  CT angiogram chest negative for PE but with extensive airway filling lower lobes consistent with pneumonia. --PCCM following, appreciate assistance --WBC  23.8>45.5>28.2>27.0>20.1>24.7 --PCT 1.24>8.63>4.93>2.56>1.39 --Blood cultures x 2: no growth x 5 days --Sputum culture: Ordered, but not collected --Azithromycin '500mg'$  IV daily, Day #5/5 --Ceftriaxone 1g IV q24h, Day # 6/7 --Continue supplemental oxygen, maintain SPO2 greater than 92%, on 1L  (not oxygen dependent at baseline) --CBC daily, trend procalcitonin --Ambulatory O2 screen and next PT evaluation  Septic shock: resolved Etiology likely secondary to infectious process as above.  Was initially placed on midodrine and somewhat responsive to IV fluid resuscitation but since blood pressure has continued to deteriorate and was placed on a vasopressor chair. --PCCM signed off 9/1 --Titrated off of phenylephrine drip --BP now stable  Polycythemia vera Follows with hematology outpatient, Dr. Burr Medico.  Was previously on Hydrea and underwent intermittent phlebotomies but which are now on hold.  Continue outpatient follow-up with hematology.  Thrombocytopenia, chronic Platelet count of 20, stable.  No active bleeding.  Follows with hematology outpatient. --Platelet transfusion for platelet count less than 10 or active bleeding --CBC daily  Anxiety/depression --Lexapro 5 mg p.o. nightly  Dementia versus cognitive impairment Continues with intermittent agitation at nighttime, received Geodon overnight. --Delirium precautions --Get up during the day --Encourage a familiar face to remain present throughout the day --Keep blinds open and lights on during daylight hours --Minimize the use of opioids/benzodiazepines --Seroquel 12.5 mg p.o. nightly --Melatonin 6 mg p.o. nightly  BPH: Hold home Hytrin for now  Acute urinary retention Discontinued Foley catheter yesterday, did require in and out catheterization overnight. --Continue to monitor bladder scans as needed --If continues require in and out catheterization, may need to replace Foley catheter  Gout: Hold home allopurinol for  now  Weakness/deconditioning/debility: Patient currently lives in a home residence, has 24-hour caregivers and mainly lives on the first floor.  PT/OT recommends home health on discharge. -- Continue therapy while inpatient.   DVT prophylaxis: Place and maintain sequential compression device Start: 03/15/22 1111    Code Status: Full Code Family Communication: Updated son present at bedside this morning  Disposition Plan:  Level of care: Telemetry Status is: Inpatient Remains inpatient appropriate because: IV antibiotics, needs titration off of supplemental oxygen    Consultants:  PCCM - signed off 9/1  Procedures:  Vascular duplex ultrasound bilateral lower extremities  Antimicrobials:  Meropenem 8/30 - 8/30 Azithromycin 8/30>> Ceftriaxone 8/30>>   Subjective: Patient seen examined bedside, lying in bed.  Updated patient's son present at bedside this morning.  Oxygen now down to 1 L nasal cannula with SPO2 93% at rest.  Once again agitated overnight and received Geodon.  Patient slightly confused, believed he had surgery yesterday but he is currently alert and oriented x3.  Discussed with RN.  No other questions or concerns at this time.  Patient denies headache, no dizziness, no fever/chills/night sweats, no nausea/vomiting/diarrhea, no chest pain, no palpitations, no abdominal pain.  No other acute events reported overnight by nursing staff.     Objective: Vitals:   03/19/22 2031 03/19/22 2148 03/20/22 0159 03/20/22 0615  BP:  118/65 (!) 142/81 125/80  Pulse:  83 85 83  Resp:  '16 18 16  '$ Temp:  98.1 F (36.7 C) 97.9 F (36.6 C) 97.7 F (36.5 C)  TempSrc:  Oral Oral Oral  SpO2: 90% 94% 95% 93%  Weight:      Height:        Intake/Output Summary (Last 24 hours) at 03/20/2022 1340 Last data filed at 03/20/2022 1011 Gross per 24 hour  Intake 220 ml  Output 700 ml  Net -480 ml   Filed Weights   03/15/22 0705  Weight: 67.9 kg    Examination:  Physical Exam: GEN:  NAD, alert and oriented x3 to person/place/time, elderly/ill/weak in appearance HEENT: NCAT, PERRL, EOMI, sclera clear, MMM PULM: Sounds diminished bilateral bases with crackles, normal respiratory effort without accessory muscle use, no wheezing, on 1 L [place] with SPO2 93% at rest with SPO2 93% CV: RRR w/o M/G/R GI: abd soft, NTND, NABS, no R/G/M MSK: no peripheral edema, moves all extremities independently Integumentary: Chronic hyperpigmentation to skin anterior shins, multiple areas of ecchymosis to bilateral upper/lower extremities in various stages of healing.  Otherwise no other concerning rashes/lesions/wounds on exposed skin.     Data Reviewed: I have personally reviewed following labs and imaging studies  CBC: Recent Labs  Lab 03/15/22 0236 03/16/22 0329 03/17/22 0248 03/18/22 0311 03/19/22 0312 03/20/22 0455  WBC 23.8* 45.5* 28.2* 27.0* 20.1* 24.7*  NEUTROABS 18.4*  --   --   --   --   --   HGB 14.9 13.3 13.0 12.8* 13.2 14.6  HCT 54.3* 46.5 47.0 48.3 46.8 51.5  MCV 88.7 87.2 88.7 93.6 87.6 87.0  PLT 20* 20* 20* 30* 16* 14*   Basic Metabolic Panel: Recent Labs  Lab 03/16/22 0329 03/17/22 0248 03/18/22 0311 03/19/22 0312 03/20/22 0455  NA 139 141 140 139 139  K 4.6 4.3 4.1 3.8 3.7  CL 108 110 108 107 106  CO2 '23 22 25 24 26  '$ GLUCOSE 94 71 101* 94 107*  BUN 46* 46* 39* 31* 29*  CREATININE 1.61* 1.56* 1.43* 1.30* 1.20  CALCIUM 8.5* 8.4* 8.4* 8.4* 8.7*  MG 2.0  --  1.9  --   --    GFR: Estimated Creatinine Clearance: 35.4 mL/min (by C-G formula based on SCr of 1.2 mg/dL). Liver Function Tests: Recent Labs  Lab 03/15/22 0236  AST 21  ALT 14  ALKPHOS 73  BILITOT 1.9*  PROT 5.9*  ALBUMIN 3.6   No results for input(s): "LIPASE", "AMYLASE" in the last 168 hours. No results for input(s): "AMMONIA" in the last 168 hours. Coagulation Profile: No results for input(s): "INR", "PROTIME" in the last 168 hours. Cardiac Enzymes: No results for input(s): "CKTOTAL",  "CKMB", "CKMBINDEX", "TROPONINI" in the last 168 hours. BNP (last 3 results) No results for input(s): "PROBNP" in the last 8760 hours. HbA1C: No results for input(s): "HGBA1C" in the last 72 hours. CBG: No results for input(s): "GLUCAP" in the last 168 hours. Lipid Profile: No results for input(s): "CHOL", "HDL", "LDLCALC", "TRIG", "CHOLHDL", "LDLDIRECT" in the last 72 hours. Thyroid Function Tests: No results for input(s): "TSH", "T4TOTAL", "FREET4", "T3FREE", "THYROIDAB" in the last 72 hours. Anemia Panel: No results for input(s): "VITAMINB12", "FOLATE", "FERRITIN", "TIBC", "IRON", "RETICCTPCT" in the last 72 hours. Sepsis Labs: Recent Labs  Lab 03/15/22 0719 03/15/22 0920 03/16/22 0329 03/17/22 0248 03/18/22 0311 03/19/22 0312 03/20/22 0455  PROCALCITON 1.24  --   --  8.63 4.93 2.56 1.39  LATICACIDVEN 3.1* 2.4* 1.4  --   --   --   --     Recent Results (from the past 240 hour(s))  Resp Panel by RT-PCR (Flu A&B, Covid) Anterior Nasal Swab     Status: None   Collection Time: 03/15/22  2:42 AM   Specimen: Anterior Nasal Swab  Result Value Ref Range Status   SARS Coronavirus 2 by RT PCR NEGATIVE NEGATIVE Final    Comment: (NOTE) SARS-CoV-2 target nucleic acids are NOT DETECTED.  The SARS-CoV-2 RNA is generally detectable in upper respiratory specimens during the acute phase of infection. The lowest concentration of SARS-CoV-2 viral copies this assay can detect is 138 copies/mL. A negative result does not preclude SARS-Cov-2 infection and should not be used as the sole basis for treatment or other patient management decisions. A negative result may occur with  improper specimen collection/handling, submission of specimen other than nasopharyngeal swab, presence of viral mutation(s) within the areas targeted by this assay, and inadequate number of viral copies(<138 copies/mL). A negative result must be combined with clinical observations, patient history, and  epidemiological information. The expected result is Negative.  Fact Sheet for Patients:  EntrepreneurPulse.com.au  Fact Sheet for Healthcare Providers:  IncredibleEmployment.be  This test is no t yet approved or cleared by the [place] FDA and  has been authorized for detection and/or diagnosis of SARS-CoV-2 by FDA under an Emergency Use Authorization (EUA). This EUA will remain  in effect (meaning this test can be used) for the duration of the COVID-19 declaration under Section 564(b)(1) of the Act, 21 U.S.C.section 360bbb-3(b)(1), unless the authorization is terminated  or revoked sooner.       Influenza A by PCR NEGATIVE NEGATIVE Final   Influenza B by PCR NEGATIVE NEGATIVE Final    Comment: (NOTE) The Xpert Xpress SARS-CoV-2/FLU/RSV plus assay is intended as an aid in the diagnosis of influenza from Nasopharyngeal swab specimens and should not be used as a sole basis for treatment. Nasal washings and aspirates are unacceptable for Xpert Xpress SARS-CoV-2/FLU/RSV testing.  Fact Sheet for Patients: EntrepreneurPulse.com.au  Fact Sheet for Healthcare Providers: IncredibleEmployment.be  This test is not yet  approved or cleared by the [place] and has been authorized for detection and/or diagnosis of SARS-CoV-2 by FDA under an Emergency Use Authorization (EUA). This EUA will remain in effect (meaning this test can be used) for the duration of the COVID-19 declaration under Section 564(b)(1) of the Act, 21 U.S.C. section 360bbb-3(b)(1), unless the authorization is terminated or revoked.  Performed at [facility], [place] [address] [place], [place] [address]   Group A Strep by PCR     Status: None   Collection Time: 03/15/22  3:43 AM   Specimen: Throat; Sterile Swab  Result Value Ref Range Status   Group A Strep by PCR NOT DETECTED NOT DETECTED Final    Comment:  Performed at [facility], [place] [address], [place], [place] [address]  MRSA Next Gen by PCR, Nasal     Status: None   Collection Time: 03/15/22  7:08 AM   Specimen: Nasal Mucosa; Nasal Swab  Result Value Ref Range Status   MRSA by PCR Next Gen NOT DETECTED NOT DETECTED Final    Comment: (NOTE) The GeneXpert MRSA Assay (FDA approved for NASAL specimens only), is one component of a comprehensive MRSA colonization surveillance program. It is not intended to diagnose MRSA infection nor to guide or monitor treatment for MRSA infections. Test performance is not FDA approved in patients less than 35 years old. Performed at [facility], [place] [address] [place], [place] [address]   Culture, blood (Routine X 2) w Reflex to ID Panel     Status: None   Collection Time: 03/15/22  7:19 AM   Specimen: BLOOD  Result Value Ref Range Status   Specimen Description   Final    BLOOD BLOOD LEFT FOREARM Performed at [place] [address] [place], [place] [address]    Special Requests   Final    BOTTLES DRAWN AEROBIC ONLY Blood Culture adequate volume Performed at [place] [address] [place], [place] [address]    Culture   Final    NO GROWTH 5 DAYS Performed at [facility], [place] [address] [place], [place] [address]    Report Status 03/20/2022 FINAL  Final  Culture, blood (Routine X 2) w Reflex to ID Panel     Status: None   Collection Time: 03/15/22  7:19 AM   Specimen: BLOOD LEFT HAND  Result Value Ref Range Status   Specimen Description   Final    BLOOD LEFT HAND Performed at [place] [address] [place], [place] [address]    Special Requests   Final    BOTTLES DRAWN AEROBIC ONLY Blood Culture adequate volume Performed at [place] [address] [place], [place] [address]    Culture   Final    NO GROWTH 5 DAYS Performed at [facility], [place] [address] [place],  [address]    Report Status 03/20/2022 FINAL  Final         Radiology Studies: No results found.      Scheduled Meds:  bethanechol  10 mg Oral TID   Chlorhexidine Gluconate Cloth  6 each Topical Q0600   escitalopram  5 mg Oral QHS   melatonin  6 mg Oral QHS   QUEtiapine  12.5 mg Oral QHS   Continuous Infusions:  sodium chloride 10 mL/hr at 03/16/22 1141   cefTRIAXone (ROCEPHIN)  IV Stopped (03/19/22 1030)  LOS: 5 days    Time spent: 49 minutes spent on chart review, discussion with nursing staff, consultants, updating family and interview/physical exam; more than 50% of that time was spent in counseling and/or coordination of care.     Trinisha Paget J [place] DO [facility] Available via Epic secure chat 7am-7pm After these hours, please refer to coverage provider listed on [email or web address] 03/20/2022, 1:40 PM

## 2022-03-20 NOTE — Progress Notes (Signed)
Physical Therapy Treatment Patient Details Name: [name] [name] MRN: [id number] DOB: 04/12/1924 Today's Date: 03/20/2022   History of Present Illness 88 yo who presented to [facility] ED on 8/30 via EMS for acute onset shortness of breath. Pt with acute respiratory failure with hypoxia, septic shock secondary to community acquired pneumonia; AKI on CKD.  PMH: Bone marrow disease, depression, DYD, Gout, HTN, polycythemia    PT Comments    Patient remains limited by generalized weakness and fatigue. He requires min assist for bed mobility, transfers and short bout of ~30' for gait with RW. Pt is unsteady with shuffled gait pattern and requires cues/assist to keep walker in safe position and prevent posterior lean. Attempted SpO2 walking test with gait however pleuth reading was poor waveform. (See amb pulm note). Pt will benefit from continued skilled PT, he will need 24/7 assist at home and HHPT follow up. Will progress as able.   Recommendations for follow up therapy are one component of a multi-disciplinary discharge planning process, led by the attending physician.  Recommendations may be updated based on patient status, additional functional criteria and insurance authorization.  Follow Up Recommendations  Home health PT     Assistance Recommended at Discharge Frequent or constant Supervision/Assistance  Patient can return home with the following A little help with walking and/or transfers;A little help with bathing/dressing/bathroom;Help with stairs or ramp for entrance;Assistance with feeding;Assist for transportation;Assistance with cooking/housework   Equipment Recommendations  None recommended by PT    Recommendations for Other Services       Precautions / Restrictions Precautions Precautions: Fall Precaution Comments: monitor sats, frail skin Restrictions Weight Bearing Restrictions: No     Mobility  Bed Mobility Overal bed mobility: Needs Assistance Bed Mobility: Supine to  Sit     Supine to sit: Min assist, HOB elevated     General bed mobility comments: min assist to raise trunk and walk LE's off EOB. min assist to fully pivot and scoot to edge.    Transfers Overall transfer level: Needs assistance Equipment used: Rolling walker (2 wheels) Transfers: Sit to/from Stand Sit to Stand: Min assist, +2 safety/equipment           General transfer comment: min assist to power up from EOB and steady with rise;' pt with slight posterior lean in standing.    Ambulation/Gait Ambulation/Gait assistance: Min assist, +2 safety/equipment Gait Distance (Feet): 30 Feet Assistive device: Rolling walker (2 wheels) Gait Pattern/deviations: Step-through pattern, Decreased step length - right, Shuffle Gait velocity: decr     General Gait Details: shuffled pattern with poor foot clearance and flexed posture during gait. Assist to steady required. O2 pleuth not reading well, pt on RA and then 3L with gait. HR in 110's-120's with irregular rhythm.   Stairs             Wheelchair Mobility    Modified Rankin (Stroke Patients Only)       Balance Overall balance assessment: Needs assistance Sitting-balance support: Feet supported Sitting balance-Leahy Scale: Fair     Standing balance support: Bilateral upper extremity supported, During functional activity, Reliant on assistive device for balance Standing balance-Leahy Scale: Poor                              Cognition Arousal/Alertness: Awake/alert Behavior During Therapy: WFL for tasks assessed/performed Overall Cognitive Status: History of cognitive impairments - at baseline  General Comments: very cooperative and agreeable to therapy        Exercises      General Comments        Pertinent Vitals/Pain Pain Assessment Pain Assessment: Faces Faces Pain Scale: Hurts a little bit Pain Location: generalized, knee pain Pain  Descriptors / Indicators: Discomfort Pain Intervention(s): Limited activity within patient's tolerance, Monitored during session, Repositioned    Home Living                          Prior Function            PT Goals (current goals can now be found in the care plan section) Acute Rehab PT Goals Patient Stated Goal: go home PT Goal Formulation: With patient/family Time For Goal Achievement: 03/31/22 Potential to Achieve Goals: Good Progress towards PT goals: Progressing toward goals    Frequency    Min 3X/week      PT Plan Current plan remains appropriate    Co-evaluation              AM-PAC PT "6 Clicks" Mobility   Outcome Measure  Help needed turning from your back to your side while in a flat bed without using bedrails?: A Little Help needed moving from lying on your back to sitting on the side of a flat bed without using bedrails?: A Little Help needed moving to and from a bed to a chair (including a wheelchair)?: A Little Help needed standing up from a chair using your arms (e.g., wheelchair or bedside chair)?: A Little Help needed to walk in hospital room?: A Little Help needed climbing 3-5 steps with a railing? : Total 6 Click Score: 16    End of Session Equipment Utilized During Treatment: Gait belt;Oxygen Activity Tolerance: Patient tolerated treatment well Patient left: in chair;with call bell/phone within reach;with nursing/sitter in room Nurse Communication: Mobility status PT Visit Diagnosis: Unsteadiness on feet (R26.81);Difficulty in walking, not elsewhere classified (R26.2)     Time: 1325-1403 PT Time Calculation (min) (ACUTE ONLY): 38 min  Charges:  $Gait Training: 8-22 mins $Therapeutic Activity: 8-22 mins                     Verner Mould, DPT Acute Rehabilitation Services Office [phone] Pager [phone]  03/20/22 3:23 PM

## 2022-03-20 NOTE — Plan of Care (Signed)

## 2022-03-20 NOTE — Progress Notes (Signed)
Pt restless and agitated throughout the night even after PRN Haldol and scheduled melatonin. Hospitalist notified and 1x order of Geodon ordered and administered. Post administration pt asked for "more medication to help him sleep. Pt was bladder scanned throughout shift, straight cath x1 700ML removed from bladder at 0445. Pt tolerated well. Pt stated " I don't want it to stay in like last time". Reassured pt that this intervention was only to drain urine from bladder. Pt expressed he is tired and would try to get some sleep.

## 2022-03-20 NOTE — Progress Notes (Signed)
SATURATION QUALIFICATIONS: (This note is used to comply with regulatory documentation for home oxygen)  Patient Saturations on Room Air at Rest = 86%  Patient Saturations on Room Air while Ambulating = NA  Patient Saturations on 2 Liters of oxygen while Ambulating = 96%  Please briefly explain why patient needs home oxygen: Pt desats on RA at rest and requires 2L/min supplemental O2 to maintain appropriate saturation levels.  Verner Mould, DPT Acute Rehabilitation Services Office [phone] Pager [phone]  03/20/22 3:29 PM

## 2022-03-20 NOTE — Care Management Important Message (Signed)
Important Message  Patient Details IM Letter given to the Patient. Name: [name] [name] MRN: [id number] Date of Birth: 02-04-1924   Medicare Important Message Given:  Yes     [name] 03/20/2022, 9:04 AM

## 2022-03-20 NOTE — Progress Notes (Signed)
OT Cancellation Note  Patient Details Name: [name] [name] MRN: [id number] DOB: [birth year]   Cancelled Treatment:    Reason Eval/Treat Not Completed: Patient declined, no reason specified Patient declining light ADLs at this time. OT to continue to follow and check back as schedule will allow.  Rennie Plowman, MS Acute Rehabilitation Department Office# [phone]  03/20/2022, 2:34 PM

## 2022-03-21 ENCOUNTER — Inpatient Hospital Stay (HOSPITAL_COMMUNITY): Payer: Medicare Other

## 2022-03-21 DIAGNOSIS — J9601 Acute respiratory failure with hypoxia: Secondary | ICD-10-CM | POA: Diagnosis not present

## 2022-03-21 LAB — CBC
HCT: 49.8 % (ref 39.0–52.0)
Hemoglobin: 14.2 g/dL (ref 13.0–17.0)
MCH: 24.9 pg — ABNORMAL LOW (ref 26.0–34.0)
MCHC: 28.5 g/dL — ABNORMAL LOW (ref 30.0–36.0)
MCV: 87.2 fL (ref 80.0–100.0)
Platelets: 17 10*3/uL — CL (ref 150–400)
RBC: 5.71 MIL/uL (ref 4.22–5.81)
RDW: 22.8 % — ABNORMAL HIGH (ref 11.5–15.5)
WBC: 29.2 10*3/uL — ABNORMAL HIGH (ref 4.0–10.5)
nRBC: 8 % — ABNORMAL HIGH (ref 0.0–0.2)

## 2022-03-21 LAB — BASIC METABOLIC PANEL
Anion gap: 9 (ref 5–15)
BUN: 33 mg/dL — ABNORMAL HIGH (ref 8–23)
CO2: 24 mmol/L (ref 22–32)
Calcium: 8.4 mg/dL — ABNORMAL LOW (ref 8.9–10.3)
Chloride: 108 mmol/L (ref 98–111)
Creatinine, Ser: 1.33 mg/dL — ABNORMAL HIGH (ref 0.61–1.24)
GFR, Estimated: 49 mL/min — ABNORMAL LOW (ref >60)
Glucose, Bld: 80 mg/dL (ref 70–99)
Potassium: 4 mmol/L (ref 3.5–5.1)
Sodium: 141 mmol/L (ref 135–145)

## 2022-03-21 LAB — PROCALCITONIN: Procalcitonin: 1.21 ng/mL

## 2022-03-21 LAB — MAGNESIUM: Magnesium: 1.9 mg/dL (ref 1.7–2.4)

## 2022-03-21 MED ORDER — TAMSULOSIN HCL 0.4 MG PO CAPS
0.4000 mg | ORAL_CAPSULE | Freq: Every day | ORAL | Status: DC
  Administered 2022-03-21 – 2022-03-23 (×3): 0.4 mg via ORAL
  Filled 2022-03-21 (×3): qty 1

## 2022-03-21 MED ORDER — FINASTERIDE 5 MG PO TABS
5.0000 mg | ORAL_TABLET | Freq: Every day | ORAL | Status: DC
  Administered 2022-03-21 – 2022-03-23 (×3): 5 mg via ORAL
  Filled 2022-03-21 (×3): qty 1

## 2022-03-21 MED ORDER — SODIUM CHLORIDE 0.9 % IV SOLN: INTRAVENOUS | Status: AC

## 2022-03-21 MED ORDER — ENSURE ENLIVE PO LIQD
237.0000 mL | Freq: Two times a day (BID) | ORAL | Status: DC
  Administered 2022-03-21 – 2022-03-23 (×5): 237 mL via ORAL

## 2022-03-21 NOTE — Progress Notes (Signed)
Mobility Specialist - Progress Note    03/21/22 1532  Mobility  Activity Turned to right side   Pt received in bed and agreeable to assist in turning in the bed.    [facility]

## 2022-03-21 NOTE — Progress Notes (Signed)
Patient was served tilapia for dinner, upon round at at 7:30, patient asleep with dinner beside his bed. Woken up, he states he does not want his meal, but will try cereal. Unable to find any cereal on any unit, RN did purchase him cereal and assisted with feeding.  Initially able to swallow but after 10 bites became to cough after swallowing.   Performed bedside swallow, which patient passed. I am concerns he runs the risk for  silent aspiration and have placed speech to evaluate. Pill given in apple sauce will cleared by speech . Informed NP on call

## 2022-03-21 NOTE — Progress Notes (Signed)
PROGRESS NOTE    [name] [name]  GBT:[id number] DOB: May 24, 1924 DOA: 03/15/2022 PCP: [name], MD    Brief Narrative:   [name] [name] is a 88 y.o. male [employer] fast stream[employer], retired Therapist, music engineer/college professor with past medical history significant for polycythemia vera, chronic thrombocytopenia, DVT not on anticoagulation due to low platelet count, BPH, gout, anxiety/depression who presented to [facility] ED on 8/30 via EMS for acute onset shortness of breath.  Patient accompanied by one of his 24-hour caregivers.  He has been in his usual state of health but has had exposure to COVID about 2 weeks ago.  Patient also with productive cough.  Denies any other sick contacts, no changes in his home medications.  In the ED, temperature 98.2 F, HR 115, RR 27, BP 88/61, SPO2 90% on 5 L nasal cannula.  WBC 23.8, hemoglobin 14.9, platelets 20.  Sodium 142, potassium 4.6, chloride 108, CO2 25, glucose 144, BUN 31, creatinine 1.58.  Total bilirubin 1.9.  Lactic acid 2.4.  Procalcitonin 1.24.  BNP 559.1.  COVID-19 PCR negative.  Influenza A/B PCR negative.  Chest x-ray with low lung volumes with mild atelectasis lung bases.  CT angiogram chest with extensive airway filling in the lower lungs with bilateral atelectasis and pneumonia, negative for pulmonary embolism.  TRH consulted for further evaluation management of acute hypoxic respite failure secondary to community-acquired pneumonia with associated hypotension.  Assessment & Plan:   Acute respiratory failure with hypoxia, POA Septic shock secondary to community-acquired pneumonia POA Patient presenting to the ED with acute onset shortness of breath.  Noted to have elevated WBC count of 23.8, procalcitonin of 1.24 with a lactic acid of 2.4.  COVID-19 PCR/influenza A/B PCR negative.  CT angiogram chest negative for PE but with extensive airway filling lower lobes consistent with pneumonia. --PCCM following, appreciate assistance --WBC  23.8>45.5>28.2>27.0>20.1>24.7>29.2 --PCT 1.24>8.63>4.93>2.56>1.39>1.21 --Blood cultures x 2: no growth x 5 days --Sputum culture: Ordered, but not collected --Completed 5-day course of azithromycin --Ceftriaxone 1g IV q24h, Day # 7/7 --Continue supplemental oxygen, maintain SPO2 greater than 92%, on 2L [place] (not oxygen dependent at baseline) --CBC daily, trend procalcitonin --Ambulatory O2 screen, needs further evaluation by PT  Septic shock: resolved Etiology likely secondary to infectious process as above.  Was initially placed on midodrine and somewhat responsive to IV fluid resuscitation but since blood pressure has continued to deteriorate and was placed on a vasopressor chair. --PCCM signed off 9/1 --Titrated off of phenylephrine drip --BP now stable  Polycythemia vera Follows with hematology outpatient, Dr. Burr Medico.  Was previously on Hydrea and underwent intermittent phlebotomies but which are now on hold.  Continue outpatient follow-up with hematology.  Thrombocytopenia, chronic Platelet count 17, stable.  No active bleeding.  Follows with hematology outpatient. --Platelet transfusion for platelet count less than 10 or active bleeding --CBC daily  Anxiety/depression --Lexapro 5 mg p.o. nightly  Dementia versus cognitive impairment Continues with intermittent agitation at nighttime, received Geodon overnight. --Delirium precautions --Get up during the day --Encourage a familiar face to remain present throughout the day --Keep blinds open and lights on during daylight hours --Minimize the use of opioids/benzodiazepines --Seroquel 12.5 mg p.o. nightly --Melatonin 6 mg p.o. nightly  BPH:  --Finasteride 5 mg p.o. daily --Tamsulosin 0.4 mg p.o. daily  Acute urinary retention, recurrent During initial hospitalization, required Foley catheter placement for urinary retention.  Attempted voiding trial, failed now Foley cath replaced on 03/22/19/2023. --Started on tamsulosin 0.4 mg  p.o. daily, finasteride 5 mg p.o.  daily  Gout: Hold home allopurinol for now  Weakness/deconditioning/debility: Patient currently lives in a home residence, has 24-hour caregivers and mainly lives on the first floor.  PT/OT recommends home health on discharge. --Continue therapy while inpatient.   DVT prophylaxis: Place and maintain sequential compression device Start: 03/15/22 1111    Code Status: Full Code Family Communication: Updated son present at bedside yesterday afternoon, no family present at bedside this morning  Disposition Plan:  Level of care: Telemetry Status is: Inpatient Remains inpatient appropriate because: IV antibiotics, needs titration off of supplemental oxygen    Consultants:  PCCM - signed off 9/1  Procedures:  Vascular duplex ultrasound bilateral lower extremities  Antimicrobials:  Meropenem 8/30 - 8/30 Azithromycin 8/30 - 9/3 Ceftriaxone 8/30 - 9/5   Subjective: Patient seen examined bedside, lying in bed.  Continues with urinary retention following Foley catheter removal.  Is required to in and out catheterizations overnight and will need Foley catheter replacement.  Remains on oxygen, improved sleep overnight on Seroquel.  Discussed with RN.  No family present at bedside this morning.  No other questions or concerns at this time. Patient denies headache, no dizziness, no fever/chills/night sweats, no nausea/vomiting/diarrhea, no chest pain, no palpitations, no abdominal pain.  No other acute events reported overnight by nursing staff.     Objective: Vitals:   03/20/22 0615 03/20/22 1851 03/20/22 2218 03/21/22 0559  BP: 125/80 (!) 129/95 105/70 (!) 97/58  Pulse: 83 (!) 105 96 79  Resp: '16 16 18 16  '$ Temp: 97.7 F (36.5 C) 98.7 F (37.1 C) 98.1 F (36.7 C) (!) 97.5 F (36.4 C)  TempSrc: Oral  Oral Oral  SpO2: 93% 93% 93% 95%  Weight:      Height:        Intake/Output Summary (Last 24 hours) at 03/21/2022 1402 Last data filed at 03/21/2022  1200 Gross per 24 hour  Intake 636.93 ml  Output 950 ml  Net -313.07 ml   Filed Weights   03/15/22 0705  Weight: 67.9 kg    Examination:  Physical Exam: GEN: NAD, alert and oriented x3 to person/place/time, elderly/ill/weak in appearance HEENT: NCAT, PERRL, EOMI, sclera clear, MMM PULM: Sounds diminished bilateral bases with crackles, normal respiratory effort without accessory muscle use, no wheezing, on 1 L [place] with SPO2 93% at rest with SPO2 93% CV: RRR w/o M/G/R GI: abd soft, NTND, NABS, no R/G/M MSK: no peripheral edema, moves all extremities independently Integumentary: Chronic hyperpigmentation to skin anterior shins, multiple areas of ecchymosis to bilateral upper/lower extremities in various stages of healing.  Otherwise no other concerning rashes/lesions/wounds on exposed skin.     Data Reviewed: I have personally reviewed following labs and imaging studies  CBC: Recent Labs  Lab 03/15/22 0236 03/16/22 0329 03/17/22 0248 03/18/22 0311 03/19/22 0312 03/20/22 0455 03/21/22 0507  WBC 23.8*   < > 28.2* 27.0* 20.1* 24.7* 29.2*  NEUTROABS 18.4*  --   --   --   --   --   --   HGB 14.9   < > 13.0 12.8* 13.2 14.6 14.2  HCT 54.3*   < > 47.0 48.3 46.8 51.5 49.8  MCV 88.7   < > 88.7 93.6 87.6 87.0 87.2  PLT 20*   < > 20* 30* 16* 14* 17*   < > = values in this interval not displayed.   Basic Metabolic Panel: Recent Labs  Lab 03/16/22 0329 03/17/22 0248 03/18/22 1610 03/19/22 0312 03/20/22 0455 03/21/22 0507  NA  139 141 140 139 139 141  K 4.6 4.3 4.1 3.8 3.7 4.0  CL 108 110 108 107 106 108  CO2 '23 22 25 24 26 24  '$ GLUCOSE 94 71 101* 94 107* 80  BUN 46* 46* 39* 31* 29* 33*  CREATININE 1.61* 1.56* 1.43* 1.30* 1.20 1.33*  CALCIUM 8.5* 8.4* 8.4* 8.4* 8.7* 8.4*  MG 2.0  --  1.9  --   --  1.9   GFR: Estimated Creatinine Clearance: 31.9 mL/min (A) (by C-G formula based on SCr of 1.33 mg/dL (H)). Liver Function Tests: Recent Labs  Lab 03/15/22 0236  AST 21  ALT  14  ALKPHOS 73  BILITOT 1.9*  PROT 5.9*  ALBUMIN 3.6   No results for input(s): "LIPASE", "AMYLASE" in the last 168 hours. No results for input(s): "AMMONIA" in the last 168 hours. Coagulation Profile: No results for input(s): "INR", "PROTIME" in the last 168 hours. Cardiac Enzymes: No results for input(s): "CKTOTAL", "CKMB", "CKMBINDEX", "TROPONINI" in the last 168 hours. BNP (last 3 results) No results for input(s): "PROBNP" in the last 8760 hours. HbA1C: No results for input(s): "HGBA1C" in the last 72 hours. CBG: No results for input(s): "GLUCAP" in the last 168 hours. Lipid Profile: No results for input(s): "CHOL", "HDL", "LDLCALC", "TRIG", "CHOLHDL", "LDLDIRECT" in the last 72 hours. Thyroid Function Tests: No results for input(s): "TSH", "T4TOTAL", "FREET4", "T3FREE", "THYROIDAB" in the last 72 hours. Anemia Panel: No results for input(s): "VITAMINB12", "FOLATE", "FERRITIN", "TIBC", "IRON", "RETICCTPCT" in the last 72 hours. Sepsis Labs: Recent Labs  Lab 03/15/22 0719 03/15/22 0920 03/16/22 0329 03/17/22 0248 03/18/22 0311 03/19/22 0312 03/20/22 0455 03/21/22 0507  PROCALCITON 1.24  --   --    < > 4.93 2.56 1.39 1.21  LATICACIDVEN 3.1* 2.4* 1.4  --   --   --   --   --    < > = values in this interval not displayed.    Recent Results (from the past 240 hour(s))  Resp Panel by RT-PCR (Flu A&B, Covid) Anterior Nasal Swab     Status: None   Collection Time: 03/15/22  2:42 AM   Specimen: Anterior Nasal Swab  Result Value Ref Range Status   SARS Coronavirus 2 by RT PCR NEGATIVE NEGATIVE Final    Comment: (NOTE) SARS-CoV-2 target nucleic acids are NOT DETECTED.  The SARS-CoV-2 RNA is generally detectable in upper respiratory specimens during the acute phase of infection. The lowest concentration of SARS-CoV-2 viral copies this assay can detect is 138 copies/mL. A negative result does not preclude SARS-Cov-2 infection and should not be used as the sole basis for  treatment or other patient management decisions. A negative result may occur with  improper specimen collection/handling, submission of specimen other than nasopharyngeal swab, presence of viral mutation(s) within the areas targeted by this assay, and inadequate number of viral copies(<138 copies/mL). A negative result must be combined with clinical observations, patient history, and epidemiological information. The expected result is Negative.  Fact Sheet for Patients:  EntrepreneurPulse.com.au  Fact Sheet for Healthcare Providers:  IncredibleEmployment.be  This test is no t yet approved or cleared by the [place] FDA and  has been authorized for detection and/or diagnosis of SARS-CoV-2 by FDA under an Emergency Use Authorization (EUA). This EUA will remain  in effect (meaning this test can be used) for the duration of the COVID-19 declaration under Section 564(b)(1) of the Act, 21 U.S.C.section 360bbb-3(b)(1), unless the authorization is terminated  or revoked sooner.  Influenza A by PCR NEGATIVE NEGATIVE Final   Influenza B by PCR NEGATIVE NEGATIVE Final    Comment: (NOTE) The Xpert Xpress SARS-CoV-2/FLU/RSV plus assay is intended as an aid in the diagnosis of influenza from Nasopharyngeal swab specimens and should not be used as a sole basis for treatment. Nasal washings and aspirates are unacceptable for Xpert Xpress SARS-CoV-2/FLU/RSV testing.  Fact Sheet for Patients: EntrepreneurPulse.com.au  Fact Sheet for Healthcare Providers: IncredibleEmployment.be  This test is not yet approved or cleared by the [place] FDA and has been authorized for detection and/or diagnosis of SARS-CoV-2 by FDA under an Emergency Use Authorization (EUA). This EUA will remain in effect (meaning this test can be used) for the duration of the COVID-19 declaration under Section 564(b)(1) of the Act, 21  U.S.C. section 360bbb-3(b)(1), unless the authorization is terminated or revoked.  Performed at [facility], [place] [address] [place], [place] [address]   Group A Strep by PCR     Status: None   Collection Time: 03/15/22  3:43 AM   Specimen: Throat; Sterile Swab  Result Value Ref Range Status   Group A Strep by PCR NOT DETECTED NOT DETECTED Final    Comment: Performed at [facility], [place] [address] [place], [place] [address]  MRSA Next Gen by PCR, Nasal     Status: None   Collection Time: 03/15/22  7:08 AM   Specimen: Nasal Mucosa; Nasal Swab  Result Value Ref Range Status   MRSA by PCR Next Gen NOT DETECTED NOT DETECTED Final    Comment: (NOTE) The GeneXpert MRSA Assay (FDA approved for NASAL specimens only), is one component of a comprehensive MRSA colonization surveillance program. It is not intended to diagnose MRSA infection nor to guide or monitor treatment for MRSA infections. Test performance is not FDA approved in patients less than 1 years old. Performed at [facility], [place] [address], [place], [place] [address]   Culture, blood (Routine X 2) w Reflex to ID Panel     Status: None   Collection Time: 03/15/22  7:19 AM   Specimen: BLOOD  Result Value Ref Range Status   Specimen Description   Final    BLOOD BLOOD LEFT FOREARM Performed at [place] [address], [place], [place] [address]    Special Requests   Final    BOTTLES DRAWN AEROBIC ONLY Blood Culture adequate volume Performed at [facility] [address], [place], [place] [address]    Culture   Final    NO GROWTH 5 DAYS Performed at [facility], [place] [address] [place], [place] [address]    Report Status 03/20/2022 FINAL  Final  Culture, blood (Routine X 2) w Reflex to ID Panel     Status: None   Collection Time: 03/15/22  7:19 AM   Specimen: BLOOD LEFT HAND  Result Value Ref Range Status    Specimen Description   Final    BLOOD LEFT HAND Performed at [facility] [address], [place], [place] [address]    Special Requests   Final    BOTTLES DRAWN AEROBIC ONLY Blood Culture adequate volume Performed at [place] [address], [place], [place] [address]    Culture   Final    NO GROWTH 5 DAYS Performed at [facility], [place] [address], [place],  [address]    Report Status 03/20/2022 FINAL  Final  Radiology Studies: DG CHEST PORT 1 VIEW  Result Date: 03/21/2022 CLINICAL DATA:  Shortness of breath EXAM: PORTABLE CHEST 1 VIEW COMPARISON:  Chest radiograph 03/17/2022 FINDINGS: The cardiomediastinal silhouette is stable. There is unchanged asymmetric elevation of the right hemidiaphragm. Aeration in the lung bases appears improved since 03/17/2022 there is no new or worsening focal airspace disease. A calcified granuloma in the left apex is unchanged. There is no significant pleural effusion. There is no pneumothorax The bones are stable. IMPRESSION: Improved aeration in the lung bases since 03/17/2022. Electronically Signed   By: Valetta Mole M.D.   On: 03/21/2022 08:34        Scheduled Meds:  Chlorhexidine Gluconate Cloth  6 each Topical Q0600   escitalopram  5 mg Oral QHS   feeding supplement  237 mL Oral BID BM   finasteride  5 mg Oral Daily   melatonin  6 mg Oral QHS   QUEtiapine  12.5 mg Oral QHS   senna-docusate  1 tablet Oral BID   tamsulosin  0.4 mg Oral Daily   Continuous Infusions:  sodium chloride 10 mL/hr at 03/16/22 1141   sodium chloride 75 mL/hr at 03/21/22 0807     LOS: 6 days    Time spent: 49 minutes spent on chart review, discussion with nursing staff, consultants, updating family and interview/physical exam; more than 50% of that time was spent in counseling and/or coordination of care.     Media Pizzini J [place] DO [facility] Available via Epic secure chat 7am-7pm After  these hours, please refer to coverage provider listed on [email or web address] 03/21/2022, 2:02 PM

## 2022-03-22 ENCOUNTER — Inpatient Hospital Stay (HOSPITAL_COMMUNITY): Payer: Medicare Other

## 2022-03-22 DIAGNOSIS — J9601 Acute respiratory failure with hypoxia: Secondary | ICD-10-CM | POA: Diagnosis not present

## 2022-03-22 LAB — CBC
HCT: 49.2 % (ref 39.0–52.0)
Hemoglobin: 13.6 g/dL (ref 13.0–17.0)
MCH: 24.2 pg — ABNORMAL LOW (ref 26.0–34.0)
MCHC: 27.6 g/dL — ABNORMAL LOW (ref 30.0–36.0)
MCV: 87.7 fL (ref 80.0–100.0)
Platelets: 15 10*3/uL — CL (ref 150–400)
RBC: 5.61 MIL/uL (ref 4.22–5.81)
RDW: 23 % — ABNORMAL HIGH (ref 11.5–15.5)
WBC: 25.9 10*3/uL — ABNORMAL HIGH (ref 4.0–10.5)
nRBC: 8.3 % — ABNORMAL HIGH (ref 0.0–0.2)

## 2022-03-22 LAB — PROCALCITONIN: Procalcitonin: 0.64 ng/mL

## 2022-03-22 LAB — BASIC METABOLIC PANEL
Anion gap: 5 (ref 5–15)
BUN: 28 mg/dL — ABNORMAL HIGH (ref 8–23)
CO2: 21 mmol/L — ABNORMAL LOW (ref 22–32)
Calcium: 6.5 mg/dL — ABNORMAL LOW (ref 8.9–10.3)
Chloride: 115 mmol/L — ABNORMAL HIGH (ref 98–111)
Creatinine, Ser: 1.12 mg/dL (ref 0.61–1.24)
GFR, Estimated: >60 mL/min (ref >60)
Glucose, Bld: 77 mg/dL (ref 70–99)
Potassium: 3 mmol/L — ABNORMAL LOW (ref 3.5–5.1)
Sodium: 141 mmol/L (ref 135–145)

## 2022-03-22 LAB — MAGNESIUM: Magnesium: 1.6 mg/dL — ABNORMAL LOW (ref 1.7–2.4)

## 2022-03-22 MED ORDER — QUETIAPINE FUMARATE 25 MG PO TABS
25.0000 mg | ORAL_TABLET | Freq: Every day | ORAL | Status: DC
  Administered 2022-03-22: 25 mg via ORAL
  Filled 2022-03-22: qty 1

## 2022-03-22 MED ORDER — POTASSIUM CHLORIDE 10 MEQ/100ML IV SOLN
10.0000 meq | INTRAVENOUS | Status: AC
  Administered 2022-03-22 (×2): 10 meq via INTRAVENOUS
  Filled 2022-03-22 (×2): qty 100

## 2022-03-22 MED ORDER — POTASSIUM CHLORIDE CRYS ER 20 MEQ PO TBCR
40.0000 meq | EXTENDED_RELEASE_TABLET | Freq: Once | ORAL | Status: AC
  Administered 2022-03-22: 40 meq via ORAL
  Filled 2022-03-22: qty 2

## 2022-03-22 MED ORDER — MAGNESIUM SULFATE 2 GM/50ML IV SOLN
2.0000 g | Freq: Once | INTRAVENOUS | Status: AC
  Administered 2022-03-22: 2 g via INTRAVENOUS
  Filled 2022-03-22: qty 50

## 2022-03-22 NOTE — Progress Notes (Signed)
Physical Therapy Treatment Patient Details Name: [name] [name] MRN: [id number] DOB: 08-Apr-1924 Today's Date: 03/22/2022   History of Present Illness 88 yo who presented to [facility] ED on 8/30 via EMS for acute onset shortness of breath. Pt with acute respiratory failure with hypoxia, septic shock secondary to community acquired pneumonia; AKI on CKD.  PMH: Bone marrow disease, depression, DYD, Gout, HTN, polycythemia    PT Comments    Pt is slowly progressing toward acute PT goals this session with slightly increased ambulation distance and deceased assist required for transfers. Pt ambulated ~48f and additional 150fwith MIN A and close chair follow for safety. Limited with further distance due to general fatigue and LE muscle weakness.  Pt will benefit from continued skilled PT to increase their independence and maximize safety with mobility.     Recommendations for follow up therapy are one component of a multi-disciplinary discharge planning process, led by the attending physician.  Recommendations may be updated based on patient status, additional functional criteria and insurance authorization.  Follow Up Recommendations        Assistance Recommended at Discharge Frequent or constant Supervision/Assistance  Patient can return home with the following A little help with walking and/or transfers;A little help with bathing/dressing/bathroom;Help with stairs or ramp for entrance;Assistance with feeding;Assist for transportation;Assistance with cooking/housework   Equipment Recommendations  None recommended by PT    Recommendations for Other Services       Precautions / Restrictions Precautions Precautions: Fall Precaution Comments: monitor sats, frail skin Restrictions Weight Bearing Restrictions: No     Mobility  Bed Mobility Overal bed mobility: Needs Assistance Bed Mobility: Supine to Sit     Supine to sit: HOB elevated, Min assist     General bed mobility comments: min  assist to raise trunk and fully pivot and scoot to edge.    Transfers Overall transfer level: Needs assistance Equipment used: Rolling walker (2 wheels) Transfers: Sit to/from Stand Sit to Stand: Min assist, +2 safety/equipment           General transfer comment: x2; min assist with heavy use fo B UEs to power up, cues for hand placement and MIN A to steady. pt with slight posterior lean in standing.    Ambulation/Gait Ambulation/Gait assistance: Min assist, +2 safety/equipment Gait Distance (Feet): 22 Feet Assistive device: Rolling walker (2 wheels) Gait Pattern/deviations: Step-through pattern, Decreased step length - right, Shuffle, Knee flexed in stance - left, Knee flexed in stance - right Gait velocity: decr     General Gait Details: additional 151following seated rest break. shuffled pattern with B knees flexed, poor foot clearance and flexed posture during gait. Cues for increased step length, foot clearance, and forward gaze, pt reports unable to maintain upright posture due to cervical/back discomfort. Assist to steady required. O2 reading 94% on 3L, 93% on RA prior to ambulation. O2 low of 87% and HR 49bpm on RA during ambulation, RN notified. O2 94% and HR 97bpm on 2L during second bout of ambulation.   Stairs             Wheelchair Mobility    Modified Rankin (Stroke Patients Only)       Balance Overall balance assessment: Needs assistance Sitting-balance support: Feet supported Sitting balance-Leahy Scale: Fair     Standing balance support: Bilateral upper extremity supported, During functional activity, Reliant on assistive device for balance Standing balance-Leahy Scale: Poor  Cognition Arousal/Alertness: Awake/alert Behavior During Therapy: WFL for tasks assessed/performed Overall Cognitive Status: History of cognitive impairments - at baseline                                 General  Comments: very cooperative and agreeable to therapy        Exercises      General Comments        Pertinent Vitals/Pain Pain Assessment Pain Assessment: No/denies pain    Home Living                          Prior Function            PT Goals (current goals can now be found in the care plan section) Acute Rehab PT Goals Patient Stated Goal: go home PT Goal Formulation: With patient/family Time For Goal Achievement: 03/31/22 Potential to Achieve Goals: Good Progress towards PT goals: Progressing toward goals    Frequency    Min 3X/week      PT Plan Current plan remains appropriate    Co-evaluation              AM-PAC PT "6 Clicks" Mobility   Outcome Measure  Help needed turning from your back to your side while in a flat bed without using bedrails?: A Little Help needed moving from lying on your back to sitting on the side of a flat bed without using bedrails?: A Little Help needed moving to and from a bed to a chair (including a wheelchair)?: A Little Help needed standing up from a chair using your arms (e.g., wheelchair or bedside chair)?: A Little Help needed to walk in hospital room?: A Little Help needed climbing 3-5 steps with a railing? : Total 6 Click Score: 16    End of Session Equipment Utilized During Treatment: Gait belt;Oxygen Activity Tolerance: Patient tolerated treatment well Patient left: in chair;with call bell/phone within reach;with chair alarm set Nurse Communication: Mobility status (O2 sats and HR during mobility) PT Visit Diagnosis: Unsteadiness on feet (R26.81);Difficulty in walking, not elsewhere classified (R26.2)     Time: 1610-9604 PT Time Calculation (min) (ACUTE ONLY): 23 min  Charges:  $Therapeutic Activity: 23-37 mins                    Festus Barren PT, DPT  Acute Rehabilitation Services  Office [phone]  03/22/2022, 2:32 PM

## 2022-03-22 NOTE — Progress Notes (Signed)
OT Cancellation Note  Patient Details Name: [name] [name] MRN: [id number] DOB: February 06, 1924   Cancelled Treatment:    Reason Eval/Treat Not Completed: Patient at procedure or test/ unavailable Patient is heading down for swallow eval. OT to continue to follow and check back as schedule will allow.  Rennie Plowman, [place] Acute Rehabilitation Department Office# [phone]  03/22/2022, 2:01 PM

## 2022-03-22 NOTE — Progress Notes (Signed)
PROGRESS NOTE [name] [name]  JJH:[id number] DOB: December 29, 1923 DOA: 03/15/2022 PCP: [name], MD   Brief Narrative/Hospital Course: 88M w/ Hx polycythemia vera, thrombocytopenia, DVT, BPH, gout, anxiety/depression presented with acute onset of shortness of breath. In ED COVID-19 PCR negative.  Influenza A/B PCR negative.  Chest x-ray with low lung volumes with mild atelectasis lung bases.  CT angiogram chest with extensive airway filling in the lower lungs with bilateral atelectasis and pneumonia, negative for pulmonary embolism.He was admitted with respiratory failure 2/2 PNEUMONIA,septic shock,AKI on CKD 3A treated w/ azithromycin/ceftriaxone. PCCM now signed off    Subjective: Seen and examined.  He is resting in the bedside chair.  Appears weak and frail. Overnight in delirium/restless and agitated despite of Haldol On nasal cannula oxygen.  Afebrile overnight Also couhging while eating, SLP consulted.  Assessment and Plan:  Acute respiratory failure with hypoxia POA Septic shock due to CAP POA-now resolved: Presented with shortness of breath of acute onset underwent extensive work-up evaluation.  Seen by pulmonary.  Still with persistent leukocytosis blood cultures no growth x5 days sputum culture not done, completed 5 days of azithromycin and 7 days of ceftriaxone.  Hemodynamically stable, shock resolved.  PCCM signed off 9/1, off phenylephrine drip.  WBC still up but has PRV,with Pro-Cal reassuring at 0.6 -seems to be improving clinically.  Pro-Cal on admission 8.6. Wean supplemental oxygen as tolerated-May need to go home on oxygen.  Polycythemia vera Leukocytosis: WBC count variable 16-45 K in the past.followed by hematology Dr. Burr Medico on [facility] previously and underwent intermittent phlebotomies but now on hold.  Outpatient follow-up advised.  Mild AKI creatinine peaked 1.5 currently at 1.1 Hypokalemia/hypmagnesemia: Replete with K and mag  Chronic thrombocytopenia: in 15-17 K  range.  No active bleeding,transfuse for less than 10 or active bleeding monitor CBC.  Dementia versus cognitive impairment with intermittent agitation at nighttime: Continue delirium precaution supportive care, Seroquel nightly, melatonin nightly minimize opiates benzodiazepine.  On Haldol as needed monitoring QTc check EKG and he had prolonged Qtc-we will consider going up on Seroquel, discussed with the son  Coughing while eating possible dysphagia: SLP eval requested, on dysphagia 3 diet  BPH Acute urinary retention: continue finasteride and Flomax.  Need Foley catheter placement for retention attempted voiding trial failed Foley replaced 9/5.  Will need outpatient urology follow-up  Gout allopurinol on hold  Anxiety/Depression continue Lexapro  Deconditioning/debility/generalized weakness: Continue to mobilize work with PT OT patient has 24-hour caregiver hemodynamically stable.,  Lives in home residence.  PT OT advised home health upon discharge  DVT prophylaxis: Place and maintain sequential compression device Start: 03/15/22 1111 Code Status:   Code Status: Full Code Family Communication: plan of care discussed with patient at bedside.  Called and updated his son. Patient status is: Inpatient because of ongoing management Level of care: Telemetry   Dispo: The patient is from: home            Anticipated disposition: home in 1-2 days  Mobility Assessment (last 72 hours)     Mobility Assessment     Row Name 03/20/22 1501 03/20/22 0800 03/19/22 1500       Does patient have an order for bedrest or is patient medically unstable -- No - Continue assessment No - Continue assessment     What is the highest level of mobility based on the progressive mobility assessment? Level 5 (Walks with assist in room/hall) - Balance while stepping forward/back and can walk in room with assist - Complete Level  5 (Walks with assist in room/hall) - Balance while stepping forward/back and can walk in  room with assist - Complete Level 5 (Walks with assist in room/hall) - Balance while stepping forward/back and can walk in room with assist - Complete               Objective: Vitals last 24 hrs: Vitals:   03/21/22 0559 03/21/22 1414 03/21/22 2031 03/22/22 0703  BP: (!) 97/58 (!) 99/57 114/64 136/85  Pulse: 79 85 94 93  Resp: '16 15 18 18  '$ Temp: (!) 97.5 F (36.4 C) 98.9 F (37.2 C) 98.7 F (37.1 C) 98.1 F (36.7 C)  TempSrc: Oral Oral Oral Oral  SpO2: 95% 93% 90% 93%  Weight:      Height:       Weight change:   Physical Examination: General exam: alert awake,frail elderly on bedside chair older than 86, weak appearing. HEENT:Oral mucosa moist, Ear/Nose WNL grossly, dentition normal. Respiratory system: bilaterally diminished BS, no use of accessory muscle Cardiovascular system: S1 & S2 +, No JVD. Gastrointestinal system: Abdomen soft,NT,ND, BS+ Nervous System:Alert, awake, moving extremities and grossly nonfocal Extremities: LE edema neg,distal peripheral pulses palpable.  Skin: No rashes,no icterus. MSK: Normal muscle bulk,tone, power  Medications reviewed:  Scheduled Meds:  Chlorhexidine Gluconate Cloth  6 each Topical Q0600   escitalopram  5 mg Oral QHS   feeding supplement  237 mL Oral BID BM   finasteride  5 mg Oral Daily   melatonin  6 mg Oral QHS   potassium chloride  40 mEq Oral Once   QUEtiapine  12.5 mg Oral QHS   senna-docusate  1 tablet Oral BID   tamsulosin  0.4 mg Oral Daily   Continuous Infusions:  sodium chloride 10 mL/hr at 03/16/22 1141   magnesium sulfate bolus IVPB     potassium chloride       Diet Order             DIET DYS 3 Room service appropriate? Yes; Fluid consistency: Thin  Diet effective now                    Intake/Output Summary (Last 24 hours) at 03/22/2022 1128 Last data filed at 03/22/2022 0700 Gross per 24 hour  Intake 1835.25 ml  Output 1100 ml  Net 735.25 ml   Net IO Since Admission: -3,320.15 mL  [03/22/22 1128]  Wt Readings from Last 3 Encounters:  03/15/22 67.9 kg  03/01/22 68.8 kg  11/29/21 71 kg     Unresulted Labs (From admission, onward)    None     Data Reviewed: I have personally reviewed following labs and imaging studies CBC: Recent Labs  Lab 03/18/22 0311 03/19/22 0312 03/20/22 0455 03/21/22 0507 03/22/22 0838  WBC 27.0* 20.1* 24.7* 29.2* 25.9*  HGB 12.8* 13.2 14.6 14.2 13.6  HCT 48.3 46.8 51.5 49.8 49.2  MCV 93.6 87.6 87.0 87.2 87.7  PLT 30* 16* 14* 17* 15*   Basic Metabolic Panel: Recent Labs  Lab 03/16/22 0329 03/17/22 0248 03/18/22 0311 03/19/22 0312 03/20/22 0455 03/21/22 0507 03/22/22 0838  NA 139   < > 140 139 139 141 141  K 4.6   < > 4.1 3.8 3.7 4.0 3.0*  CL 108   < > 108 107 106 108 115*  CO2 23   < > '25 24 26 24 '$ 21*  GLUCOSE 94   < > 101* 94 107* 80 77  BUN 46*   < >  39* 31* 29* 33* 28*  CREATININE 1.61*   < > 1.43* 1.30* 1.20 1.33* 1.12  CALCIUM 8.5*   < > 8.4* 8.4* 8.7* 8.4* 6.5*  MG 2.0  --  1.9  --   --  1.9 1.6*   < > = values in this interval not displayed.   GFR: Estimated Creatinine Clearance: 37.9 mL/min (by C-G formula based on SCr of 1.12 mg/dL). Liver Function Tests: No results for input(s): "AST", "ALT", "ALKPHOS", "BILITOT", "PROT", "ALBUMIN" in the last 168 hours. No results for input(s): "LIPASE", "AMYLASE" in the last 168 hours. No results for input(s): "AMMONIA" in the last 168 hours. Coagulation Profile: No results for input(s): "INR", "PROTIME" in the last 168 hours. BNP (last 3 results) No results for input(s): "PROBNP" in the last 8760 hours. HbA1C: No results for input(s): "HGBA1C" in the last 72 hours. CBG: No results for input(s): "GLUCAP" in the last 168 hours. Lipid Profile: No results for input(s): "CHOL", "HDL", "LDLCALC", "TRIG", "CHOLHDL", "LDLDIRECT" in the last 72 hours. Thyroid Function Tests: No results for input(s): "TSH", "T4TOTAL", "FREET4", "T3FREE", "THYROIDAB" in the last 72  hours. Sepsis Labs: Recent Labs  Lab 03/16/22 0329 03/17/22 0248 03/19/22 0312 03/20/22 0455 03/21/22 0507 03/22/22 0838  PROCALCITON  --    < > 2.56 1.39 1.21 0.64  LATICACIDVEN 1.4  --   --   --   --   --    < > = values in this interval not displayed.    Recent Results (from the past 240 hour(s))  Resp Panel by RT-PCR (Flu A&B, Covid) Anterior Nasal Swab     Status: None   Collection Time: 03/15/22  2:42 AM   Specimen: Anterior Nasal Swab  Result Value Ref Range Status   SARS Coronavirus 2 by RT PCR NEGATIVE NEGATIVE Final    Comment: (NOTE) SARS-CoV-2 target nucleic acids are NOT DETECTED.  The SARS-CoV-2 RNA is generally detectable in upper respiratory specimens during the acute phase of infection. The lowest concentration of SARS-CoV-2 viral copies this assay can detect is 138 copies/mL. A negative result does not preclude SARS-Cov-2 infection and should not be used as the sole basis for treatment or other patient management decisions. A negative result may occur with  improper specimen collection/handling, submission of specimen other than nasopharyngeal swab, presence of viral mutation(s) within the areas targeted by this assay, and inadequate number of viral copies(<138 copies/mL). A negative result must be combined with clinical observations, patient history, and epidemiological information. The expected result is Negative.  Fact Sheet for Patients:  EntrepreneurPulse.com.au  Fact Sheet for Healthcare Providers:  IncredibleEmployment.be  This test is no t yet approved or cleared by the [place] FDA and  has been authorized for detection and/or diagnosis of SARS-CoV-2 by FDA under an Emergency Use Authorization (EUA). This EUA will remain  in effect (meaning this test can be used) for the duration of the COVID-19 declaration under Section 564(b)(1) of the Act, 21 U.S.C.section 360bbb-3(b)(1), unless the authorization is  terminated  or revoked sooner.       Influenza A by PCR NEGATIVE NEGATIVE Final   Influenza B by PCR NEGATIVE NEGATIVE Final    Comment: (NOTE) The Xpert Xpress SARS-CoV-2/FLU/RSV plus assay is intended as an aid in the diagnosis of influenza from Nasopharyngeal swab specimens and should not be used as a sole basis for treatment. Nasal washings and aspirates are unacceptable for Xpert Xpress SARS-CoV-2/FLU/RSV testing.  Fact Sheet for Patients: EntrepreneurPulse.com.au  Fact Sheet  for Healthcare Providers: IncredibleEmployment.be  This test is not yet approved or cleared by the [place] and has been authorized for detection and/or diagnosis of SARS-CoV-2 by FDA under an Emergency Use Authorization (EUA). This EUA will remain in effect (meaning this test can be used) for the duration of the COVID-19 declaration under Section 564(b)(1) of the Act, 21 U.S.C. section 360bbb-3(b)(1), unless the authorization is terminated or revoked.  Performed at [facility], [place] [address], [place], [place] [address]   Group A Strep by PCR     Status: None   Collection Time: 03/15/22  3:43 AM   Specimen: Throat; Sterile Swab  Result Value Ref Range Status   Group A Strep by PCR NOT DETECTED NOT DETECTED Final    Comment: Performed at [facility], [place] [address] [place], [place] [address]  MRSA Next Gen by PCR, Nasal     Status: None   Collection Time: 03/15/22  7:08 AM   Specimen: Nasal Mucosa; Nasal Swab  Result Value Ref Range Status   MRSA by PCR Next Gen NOT DETECTED NOT DETECTED Final    Comment: (NOTE) The GeneXpert MRSA Assay (FDA approved for NASAL specimens only), is one component of a comprehensive MRSA colonization surveillance program. It is not intended to diagnose MRSA infection nor to guide or monitor treatment for MRSA infections. Test performance is not FDA approved in patients less than 74  years old. Performed at [facility], [place] [address], [place], [place] [address]   Culture, blood (Routine X 2) w Reflex to ID Panel     Status: None   Collection Time: 03/15/22  7:19 AM   Specimen: BLOOD  Result Value Ref Range Status   Specimen Description   Final    BLOOD BLOOD LEFT FOREARM Performed at [place] [address], [place], [place] [address]    Special Requests   Final    BOTTLES DRAWN AEROBIC ONLY Blood Culture adequate volume Performed at [place] [address], [place], [place] [address]    Culture   Final    NO GROWTH 5 DAYS Performed at [facility], [place] [address], [place], [place] [address]    Report Status 03/20/2022 FINAL  Final  Culture, blood (Routine X 2) w Reflex to ID Panel     Status: None   Collection Time: 03/15/22  7:19 AM   Specimen: BLOOD LEFT HAND  Result Value Ref Range Status   Specimen Description   Final    BLOOD LEFT HAND Performed at [place] [address] [place], [place] [address]    Special Requests   Final    BOTTLES DRAWN AEROBIC ONLY Blood Culture adequate volume Performed at [place] [address], [place], [place] [address]    Culture   Final    NO GROWTH 5 DAYS Performed at [facility], [place] [address], [place], [place] [address]    Report Status 03/20/2022 FINAL  Final    Antimicrobials: Anti-infectives (From admission, onward)    Start     Dose/Rate Route Frequency Ordered Stop   03/15/22 1100  cefTRIAXone (ROCEPHIN) 1 g in sodium chloride 0.9 % 100 mL IVPB        1 g 200 mL/hr over 30 Minutes Intravenous Every 24 hours 03/15/22 0912 03/21/22 1004   03/15/22 1100  azithromycin (ZITHROMAX) 500 mg in sodium chloride 0.9 % 250  mL IVPB        500 mg 250 mL/hr over 60 Minutes Intravenous Daily 03/15/22 0912 03/19/22 1204   03/15/22 0800  meropenem (MERREM) 1 g in sodium chloride 0.9 % 100  mL IVPB  Status:  Discontinued        1 g 200 mL/hr over 30 Minutes Intravenous Every 12 hours 03/15/22 0701 03/15/22 0912   03/15/22 0500  doxycycline (VIBRA-TABS) tablet 100 mg        100 mg Oral  Once 03/15/22 0445 03/15/22 0505      Culture/Microbiology    Component Value Date/Time   SDES  03/15/2022 0719    BLOOD BLOOD LEFT FOREARM Performed at [facility], [place] [address], [place], [place] [address]    SDES  03/15/2022 [phone]    BLOOD LEFT HAND Performed at [facility], [place] [address] [place], [place] [address]    SPECREQUEST  03/15/2022 0719    BOTTLES DRAWN AEROBIC ONLY Blood Culture adequate volume Performed at [place] [address] [place], [place] [address]    SPECREQUEST  03/15/2022 0719    BOTTLES DRAWN AEROBIC ONLY Blood Culture adequate volume Performed at [place] [address], [place], [place] [address]    CULT  03/15/2022 0719    NO GROWTH 5 DAYS Performed at [place] [address] [place], [place] [address]    CULT  03/15/2022 0719    NO GROWTH 5 DAYS Performed at [place] [address] [place], [place] [address]    REPTSTATUS 03/20/2022 FINAL 03/15/2022 0719   REPTSTATUS 03/20/2022 FINAL 03/15/2022 0719  Radiology Studies: DG CHEST PORT 1 VIEW  Result Date: 03/21/2022 CLINICAL DATA:  Shortness of breath EXAM: PORTABLE CHEST 1 VIEW COMPARISON:  Chest radiograph 03/17/2022 FINDINGS: The cardiomediastinal silhouette is stable. There is unchanged asymmetric elevation of the right hemidiaphragm. Aeration in the lung bases appears improved since 03/17/2022 there is no new or worsening focal airspace disease. A calcified granuloma in the left apex is unchanged. There is no significant pleural effusion. There is no pneumothorax The bones are stable. IMPRESSION: Improved aeration in the lung bases since 03/17/2022. Electronically Signed   By: Valetta Mole M.D.   On: 03/21/2022 08:34     LOS: 7 days   Antonieta Pert, MD [facility]  03/22/2022, 11:28 AM

## 2022-03-22 NOTE — Progress Notes (Addendum)
Continued delirium overnight. Patient very restless and agitated in spite prn haldol administration. Contacted NP on call . Requested bedside evaluation, due to no other options for agitation.  Patient requesting a "sedative" to help him sleep.

## 2022-03-22 NOTE — Progress Notes (Signed)
Modified Barium Swallow Progress Note  Patient Details  Name: [name] [name] MRN: [id number] Date of Birth: 1924-05-25  Today's Date: 03/22/2022  Modified Barium Swallow completed.  Full report located under Chart Review in the Imaging Section.  Brief recommendations include the following:  Clinical Impression  Patient presents with mild oropharyngeal dysphagia with sensorimotor deficits.  Impaired strength of oral bolus propulsion results in lingual pumping with purees and decreased clearance through pharynx.  Retention in pharynx across all consistencies present - and without pt awareness/sensation.   Following solids with sips of liquids assisted to clear pharynx of solids that are largely at vallecular space.   Pt appears with potential cervical osteophyte that may contribute to impaired clearance into cervical esophagus.  Head turn left nor right improved pharyngeal clearance.   Trace flash laryngeal penetration of thin during swallow noted. No aspiration or frank deep laryngeal penetration.  Using teach back, educated pt to dysphagia and aspiration compensation strategies. Given his pharyngeal retention, suspect he does have occasional aspiration.  Advised he follow solids with liquids and avoid mixed consistencies.  SLP will follow briefly for swallow precaution training and will provide exercises to maximize his swallow rehab/pulmonary clearance.  Educated pt to findings using teach back and tubed swallow precaution signs the floor for nursing/staff to post in pt's room.  Thanks for this consult.   Swallow Evaluation Recommendations       SLP Diet Recommendations: Dysphagia 3 (Mech soft) solids;Thin liquid-- NO MIXED CONSISTENCIES   Liquid Administration via: Cup;Straw   Medication Administration: Whole meds with puree       Compensations: Slow rate;Small sips/bites;Follow solids with liquid   Postural Changes: Remain semi-upright after after feeds/meals (Comment);Seated upright at  90 degrees   Oral Care Recommendations: Oral care BID       Kathleen Lime, MS [facility] SLP Acute Rehab Services Office [phone] Pager [phone]  [name] 03/22/2022,5:02 PM

## 2022-03-22 NOTE — Hospital Course (Addendum)
24M w/ Hx polycythemia vera, thrombocytopenia, DVT, BPH, gout, anxiety/depression presented with acute onset of shortness of breath. In ED COVID-19 PCR negative.  Influenza A/B PCR negative.  Chest x-ray with low lung volumes with mild atelectasis lung bases.  CT angiogram chest with extensive airway filling in the lower lungs with bilateral atelectasis and pneumonia, negative for pulmonary embolism.He was admitted with respiratory failure 2/2 PNEUMONIA,septic shock,AKI on CKD 3A treated w/ azithromycin/ceftriaxone. PCCM now signed off. He has significantly improved.  He has completed course of antibiotics vitals remained stable Labs with persistent leukocytosis thrombocytopenia which is chronic.  He was able to come off the oxygen but needs oxygen for ambulation will resume home health PT OT.  Patient feels much improved and had a good night sleep on Seroquel 25 mg and son requesting prescription for discharge.  Provided follow-up information for urology for Foley removal and also with PCP.

## 2022-03-22 NOTE — Evaluation (Signed)
Clinical/Bedside Swallow Evaluation Patient Details  Name: [name] [name] MRN: [id number] Date of Birth: 19-Aug-1923  Today's Date: 03/22/2022 Time: SLP Start Time (ACUTE ONLY): 32 SLP Stop Time (ACUTE ONLY): 7829 SLP Time Calculation (min) (ACUTE ONLY): 29 min  Past Medical History:  Past Medical History:  Diagnosis Date   Bone marrow disease    BPH (benign prostatic hypertrophy)    Depression    DJD (degenerative joint disease)    Dyslipidemia    Gout    HTN (hypertension)    Mild aortic stenosis    Polycythemia vera(238.4)    Past Surgical History:  Past Surgical History:  Procedure Laterality Date   ENDOVENOUS ABLATION SAPHENOUS VEIN W/ LASER Left 09/04/2016   endovenous laser ablation left greater saphenous vein by Tinnie Gens MD   HEMORROIDECTOMY     HPI:  88 yo male adm to [facility] with respiratory issues, he states he had copius vomiting PTA that preempted his respiratory issues..  Now reports some throat discomfort - and appears significantly red at posterior soft palate with secretion retention. PMH + for HTN, DJD, gout, depression, BPH, polysythemia vera, lower limb varicose veins, thrrombocytopenia r/o PE.  Imaging negative for PE showed streaky dense opacity in lungs, small right pleural effusion, coarse calcification of LUL, no pneumothorax.  Swallow eval ordered. Pt admits to some issues with swallowing with food more than liquids.  Endorses h/o emergent endoscopy and dilation of lower esophageal sphincter approx 20 years ago.  He denies weight loss, requiring heimlich manueer or recurrent pneumonias. Pt reports eating softer foods at home - due to poor dentition, denies pill dysphagia.  Pt underwent BSE on 8/30 upon admit, reordered on 9/5 due to concern for overt apiration of cereal with milk.  CXR 03/21/2022 showed Improved aeration in the lung bases since 03/17/2022.    Assessment / Plan / Recommendation  Clinical Impression  Pt familiar to SLP from prior evaluation  approx one week ago. Today he is fully alert - sitting upright in chair.  CN exam remains unremarkable x slight lingual deviation to right upon protrustion and/or slightly decreased symmetry of left tongue.  He continues with weak volitional cough.  No overt indication of aspiration with intake observed including 2.5 ounces of water consumed twice with yale attempts, 2 ounces of cranberry juice, 3 graham crackers and approx 2 ounces of applesauce.  Delayed cough x1 approx 2 minutes after attempt at [facility] and wet voice noted x1 with mastication of cracker - that cleared with cued throat clear.  Pt has h/o esophageal stricture requiring dilation - causing SlP to question if esophageal issues may be contributing to his symptoms.  He does NOT recall coughing while consuming cereal last night, causing SlP to suspect chronic dysphagia that he has generalized as normal.  Recommend to avoid mixed consistencies, DYS3/thin diet and medicine with applesauce.  To rule out overt aspiration - MBS may be helpful.  Will plan MBS next date. SLP Visit Diagnosis: Dysphagia, unspecified (R13.10)    Aspiration Risk  Mild aspiration risk    Diet Recommendation Dysphagia 3 (Mech soft);Thin liquid   Liquid Administration via: Cup;Straw Medication Administration: Whole meds with puree Supervision: Patient able to self feed Compensations: Slow rate;Small sips/bites Postural Changes: Seated upright at 90 degrees;Remain upright for at least 30 minutes after po intake    Other  Recommendations Oral Care Recommendations: Oral care BID    Recommendations for follow up therapy are one component of a multi-disciplinary discharge planning process, led  by the attending physician.  Recommendations may be updated based on patient status, additional functional criteria and insurance authorization.  Follow up Recommendations        Assistance Recommended at Discharge Frequent or constant Supervision/Assistance  Functional Status  Assessment Patient has had a recent decline in their functional status and demonstrates the ability to make significant improvements in function in a reasonable and predictable amount of time.  Frequency and Duration min 1 x/week  1 week       Prognosis Prognosis for Safe Diet Advancement: Good Barriers to Reach Goals: Time post onset      Swallow Study   General HPI: 88 yo male adm to [facility] with respiratory issues, he states he had copius vomiting PTA that preempted his respiratory issues..  Now reports some throat discomfort - and appears significantly red at posterior soft palate with secretion retention. PMH + for HTN, DJD, gout, depression, BPH, polysythemia vera, lower limb varicose veins, thrrombocytopenia r/o PE.  Imaging negative for PE showed streaky dense opacity in lungs, small right pleural effusion, coarse calcification of LUL, no pneumothorax.  Swallow eval ordered. Pt admits to some issues with swallowing with food more than liquids.  Endorses h/o emergent endoscopy and dilation of lower esophageal sphincter approx 20 years ago.  He denies weight loss, requiring heimlich manueer or recurrent pneumonias. Pt reports eating softer foods at home - due to poor dentition, denies pill dysphagia.  Pt underwent BSE on 8/30 upon admit, reordered on 9/5 due to concern for overt apiration of cereal with milk.  CXR 03/21/2022 showed Improved aeration in the lung bases since 03/17/2022. Type of Study: Bedside Swallow Evaluation Previous Swallow Assessment: see HPI Diet Prior to this Study: NPO Temperature Spikes Noted: No Respiratory Status: Nasal cannula (3 liters) History of Recent Intubation: No Behavior/Cognition: Alert;Cooperative;Pleasant mood;Other (Comment) (HOH) Oral Cavity Assessment: Erythema Oral Care Completed by SLP: No Oral Cavity - Dentition: Adequate natural dentition Vision: Functional for self-feeding Self-Feeding Abilities: Able to feed self Patient Positioning: Upright in  bed Baseline Vocal Quality: Normal Volitional Cough: Weak Volitional Swallow: Able to elicit    Oral/Motor/Sensory Function Overall Oral Motor/Sensory Function: Mild impairment Facial ROM: Within Functional Limits Facial Symmetry: Within Functional Limits Facial Strength: Within Functional Limits Facial Sensation: Within Functional Limits Lingual ROM: Reduced right;Other (Comment) (slight deviation to the right upon protrusion) Lingual Symmetry: Abnormal symmetry left Lingual Strength: Within Functional Limits Velum: Within Functional Limits   Ice Chips Ice chips: Not tested   Thin Liquid Presentation: Cup;Straw;Self Fed Pharyngeal  Phase Impairments: Cough - Delayed Other Comments: cough x1 - delayed - approx 2 minutes after attempt at [employer] - pt consumed approx 2.5 ounces prior to requiring respiratory break    Nectar Thick Nectar Thick Liquid: Not tested   Honey Thick Honey Thick Liquid: Not tested   Puree Puree: Within functional limits Presentation: Self Fed;Spoon Other Comments: 2 ounces of applesauce   Solid     Presentation: Self Fed Pharyngeal Phase Impairments: Suspected delayed Swallow      [name] 03/22/2022,10:03 AM  Kathleen Lime, MS [facility] SLP Acute Rehab Services Office [phone] Pager [phone]

## 2022-03-23 DIAGNOSIS — J9601 Acute respiratory failure with hypoxia: Secondary | ICD-10-CM | POA: Diagnosis not present

## 2022-03-23 LAB — BASIC METABOLIC PANEL
Anion gap: 9 (ref 5–15)
BUN: 26 mg/dL — ABNORMAL HIGH (ref 8–23)
CO2: 21 mmol/L — ABNORMAL LOW (ref 22–32)
Calcium: 8.4 mg/dL — ABNORMAL LOW (ref 8.9–10.3)
Chloride: 111 mmol/L (ref 98–111)
Creatinine, Ser: 1.22 mg/dL (ref 0.61–1.24)
GFR, Estimated: 55 mL/min — ABNORMAL LOW (ref >60)
Glucose, Bld: 79 mg/dL (ref 70–99)
Potassium: 4.2 mmol/L (ref 3.5–5.1)
Sodium: 141 mmol/L (ref 135–145)

## 2022-03-23 LAB — CBC
HCT: 49.7 % (ref 39.0–52.0)
Hemoglobin: 14.2 g/dL (ref 13.0–17.0)
MCH: 24.9 pg — ABNORMAL LOW (ref 26.0–34.0)
MCHC: 28.6 g/dL — ABNORMAL LOW (ref 30.0–36.0)
MCV: 87.2 fL (ref 80.0–100.0)
Platelets: 15 10*3/uL — CL (ref 150–400)
RBC: 5.7 MIL/uL (ref 4.22–5.81)
RDW: 23.1 % — ABNORMAL HIGH (ref 11.5–15.5)
WBC: 25.9 10*3/uL — ABNORMAL HIGH (ref 4.0–10.5)
nRBC: 9.1 % — ABNORMAL HIGH (ref 0.0–0.2)

## 2022-03-23 MED ORDER — MELATONIN 3 MG PO TABS
6.0000 mg | ORAL_TABLET | Freq: Every evening | ORAL | 0 refills | Status: AC | PRN
Start: 2022-03-23 — End: 2022-04-06

## 2022-03-23 MED ORDER — SENNOSIDES-DOCUSATE SODIUM 8.6-50 MG PO TABS
1.0000 | ORAL_TABLET | Freq: Two times a day (BID) | ORAL | 0 refills | Status: AC
Start: 2022-03-23 — End: 2022-04-22

## 2022-03-23 MED ORDER — QUETIAPINE FUMARATE 25 MG PO TABS
25.0000 mg | ORAL_TABLET | Freq: Every day | ORAL | 0 refills | Status: AC
Start: 2022-03-23 — End: 2022-04-22

## 2022-03-23 MED ORDER — FINASTERIDE 5 MG PO TABS: 5.0000 mg | ORAL_TABLET | Freq: Every day | ORAL | 0 refills | Status: AC

## 2022-03-23 MED ORDER — ENSURE ENLIVE PO LIQD
237.0000 mL | Freq: Two times a day (BID) | ORAL | 0 refills | Status: AC
Start: 2022-03-23 — End: 2022-03-30

## 2022-03-23 NOTE — TOC Transition Note (Signed)
Transition of Care [facility]) - CM/SW Discharge Note   Patient Details  Name: [name] [name] MRN: [id number] Date of Birth: April 27, 1924  [facility] CM/SW Contact:  Leeroy Cha, RN Phone Number: 03/23/2022, 3:07 PM   Clinical Narrative:    Ptar called for transport at 1508   Final next level of care: [place] Barriers to Discharge: No Barriers Identified   Patient Goals and CMS Choice Patient states their goals for this hospitalization and ongoing recovery are:: To feel better CMS Medicare.gov Compare Post Acute Care list provided to:: Patient Choice offered to / list presented to : Patient, Spouse, Adult Children  Discharge Placement                       Discharge Plan and Services   Discharge Planning Services: CM Consult Post Acute Care Choice: Durable Medical Equipment          DME Arranged: Oxygen DME Agency: [employer] Date DME Agency Contacted: 03/23/22 Time DME Agency Contacted: [id number] Representative spoke with at DME Agency: [email or web address]            Social Determinants of Health ([place]) Interventions     Readmission Risk Interventions   No data to display

## 2022-03-23 NOTE — TOC Progression Note (Signed)
Transition of Care [facility]) - Progression Note    Patient Details  Name: [name] [name] MRN: [id number] Date of Birth: Oct 07, 1923  Transition of [facility]) CM/SW Contact  Leeroy Cha, RN Phone Number: 03/23/2022, 2:07 PM  Clinical Narrative:    Home and travel o2 ordered through [employer]   Expected Discharge Plan: [place] Barriers to Discharge: No Barriers Identified  Expected Discharge Plan and Services Expected Discharge Plan: [place]   Discharge Planning Services: CM Consult Post Acute Care Choice: Durable Medical Equipment Living arrangements for the past 2 months: Single Family Home Expected Discharge Date: 03/23/22               DME Arranged: Oxygen DME Agency: [employer] Date DME Agency Contacted: 03/23/22 Time DME Agency Contacted: [phone] Representative spoke with at DME Agency: [email or web address]             Social Determinants of Health ([place]) Interventions    Readmission Risk Interventions   No data to display

## 2022-03-23 NOTE — Care Management Important Message (Signed)
Important Message  Patient Details IM Letter given to the Patient Name: [name] [name] MRN: [id number] Date of Birth: 27-Sep-1923   Medicare Important Message Given:  Yes     [name] 03/23/2022, 9:15 AM

## 2022-03-23 NOTE — Discharge Summary (Signed)
Physician Discharge Summary  [name] [name] XLK:[id number] DOB: 07/05/1924 DOA: 03/15/2022  PCP: [name], MD  Admit date: 03/15/2022 Discharge date: 03/23/2022 Recommendations for Outpatient Follow-up:  Follow up with PCP in 1 weeks-call for appointment With urology for voiding trial Foley removal Please obtain BMP/CBC in one week  Discharge Dispo: home Discharge Condition: Stable Code Status:   Code Status: Full Code Diet recommendation:  Diet Order             DIET DYS 3 Room service appropriate? Yes; Fluid consistency: Thin  Diet effective now                    Brief/Interim Summary: 88M w/ Hx polycythemia vera, thrombocytopenia, DVT, BPH, gout, anxiety/depression presented with acute onset of shortness of breath. In ED COVID-19 PCR negative.  Influenza A/B PCR negative.  Chest x-ray with low lung volumes with mild atelectasis lung bases.  CT angiogram chest with extensive airway filling in the lower lungs with bilateral atelectasis and pneumonia, negative for pulmonary embolism.He was admitted with respiratory failure 2/2 PNEUMONIA,septic shock,AKI on CKD 3A treated w/ azithromycin/ceftriaxone. PCCM now signed off. He has significantly improved.  He has completed course of antibiotics vitals remained stable Labs with persistent leukocytosis thrombocytopenia which is chronic.  He was able to come off the oxygen but needs oxygen for ambulation will resume home health PT OT.  Patient feels much improved and had a good night sleep on Seroquel 25 mg and son requesting prescription for discharge.  Provided follow-up information for urology for Foley removal and also with PCP.    Discharge Diagnoses:  Acute respiratory failure with hypoxia POA Septic shock due to CAP POA-now resolved: Presented with shortness of breath of acute onset underwent extensive work-up evaluation.  Seen by pulmonary.  Still with persistent leukocytosis blood cultures no growth x5 days sputum culture not  done, completed 5 days of azithromycin and 7 days of ceftriaxone.  Hemodynamically stable, shock resolved.  PCCM signed off 9/1, off phenylephrine drip. He has significantly improved.  He has completed course of antibiotics vitals remained stable Labs with persistent leukocytosis thrombocytopenia which is chronic.  He was able to come off the oxygen but needs oxygen for ambulation. WBC still up but has PRV,with Pro-Cal reassuring at 0.6   Polycythemia vera Leukocytosis Chronic thrombocytopenia: in 15-17 K range. WBC count variable 16-45 K in the past. Plt in 15-17k, no active bleeding,transfuse for less than 10 or active bleeding monitor DG:[id number] by hematology Dr. Burr Medico on [facility] previously and underwent intermittent phlebotomies but now on hold.  Outpatient follow-up advised.  ABC BMP in 1 week   Mild AKI creatinine peaked 1.5 currently at 1.1 Hypokalemia/hypmagnesemia: Repleted    Dementia versus cognitive impairment with intermittent agitation at nighttime: Continue delirium precaution supportive care, Seroquel nightly, melatonin nightly minimize opiates benzodiazepine.  On Haldol as needed monitoring Qtc-QTc less than 400 on EKG and 9/6, discussed with son and patient increase dose to 25 mg with patient at good sleep and felt well   Coughing while eating possible dysphagia: SLP eval requested, on dysphagia 3 diet after MBS evaluation   BPH Acute urinary retention: continue finasteride and Flomax.  Need Foley catheter placement for retention attempted voiding trial failed Foley replaced 9/5.  Will need outpatient urology follow-up-follow-up information provided   Gout allopurinol on hold   Anxiety/Depression continue Lexapro   Deconditioning/debility/generalized weakness: Continue to mobilize work with PT OT patient has 24-hour caregiver hemodynamically stable.,  Lives in  home residence.  PT OT advised home health upon discharge Consults: PCCM Subjective: Alert awake oriented resting  comfortably no new complaints.  Feels strong enough to go home.  Slept well last night, able to cough oxygen this morning  Discharge Exam: Vitals:   03/22/22 2053 03/23/22 0627  BP: 133/79 133/83  Pulse: 88 84  Resp: 18 18  Temp: 98 F (36.7 C) 99.1 F (37.3 C)  SpO2: 90% 91%   General: Pt is alert, awake, not in acute distress Cardiovascular: RRR, S1/S2 +, no rubs, no gallops Respiratory: CTA bilaterally, no wheezing, no rhonchi Abdominal: Soft, NT, ND, bowel sounds + Extremities: no edema, no cyanosis  Discharge Instructions  Discharge Instructions     Increase activity slowly   Complete by: As directed       Allergies as of 03/23/2022       Reactions   Amoxicillin Swelling, Other (See Comments)   Reaction:  Hand swelling  Has patient had a PCN reaction causing immediate rash, facial/tongue/throat swelling, SOB or lightheadedness with hypotension: No Has patient had a PCN reaction causing severe rash involving mucus membranes or skin necrosis: Yes Has patient had a PCN reaction that required hospitalization No Has patient had a PCN reaction occurring within the last 10 years: No If all of the above answers are "NO", then may proceed with Cephalosporin use.   Colchicine Swelling, Other (See Comments)   Reaction:  Hand swelling         Medication List     TAKE these medications    acetaminophen 500 MG tablet Commonly known as: TYLENOL Take 650 mg by mouth every 6 (six) hours as needed for mild pain.   allopurinol 100 MG tablet Commonly known as: ZYLOPRIM Take 100 mg by mouth 2 (two) times daily.   escitalopram 10 MG tablet Commonly known as: LEXAPRO Take 10 mg by mouth daily.   feeding supplement Liqd Take 237 mLs by mouth 2 (two) times daily between meals for 7 days.   finasteride 5 MG tablet Commonly known as: PROSCAR Take 1 tablet (5 mg total) by mouth daily. Start taking on: March 24, 2022   melatonin 3 MG Tabs tablet Take 2 tablets (6 mg  total) by mouth at bedtime as needed for up to 14 days.   QUEtiapine 25 MG tablet Commonly known as: SEROQUEL Take 1 tablet (25 mg total) by mouth at bedtime.   senna-docusate 8.6-50 MG tablet Commonly known as: Senokot-S Take 1 tablet by mouth 2 (two) times daily.   terazosin 5 MG capsule Commonly known as: HYTRIN Take 5 mg by mouth daily.               Durable Medical Equipment  (From admission, onward)           Start     Ordered   03/23/22 1253  DME Oxygen  Once       Comments: Oxygen for ambulation 2l/min  Question Answer Comment  Length of Need Lifetime   Mode or (Route) Nasal cannula   Liters per Minute 2   Frequency Continuous (stationary and portable oxygen unit needed)   Oxygen delivery system Gas      03/23/22 1252            Follow-up Information     Avva, Ravisankar, MD Follow up in 1 week(s).   Specialty: Internal Medicine Contact information: [address] [place] [address] [phone]         [facility]  Follow up in 1 week(s).   Why: Please call for follow-up to be seen in next 1 week or so for voiding trial and Foley removal Contact information: [place] [address] [phone]               Allergies  Allergen Reactions   Amoxicillin Swelling and Other (See Comments)    Reaction:  Hand swelling  Has patient had a PCN reaction causing immediate rash, facial/tongue/throat swelling, SOB or lightheadedness with hypotension: No Has patient had a PCN reaction causing severe rash involving mucus membranes or skin necrosis: Yes Has patient had a PCN reaction that required hospitalization No Has patient had a PCN reaction occurring within the last 10 years: No If all of the above answers are "NO", then may proceed with Cephalosporin use.   Colchicine Swelling and Other (See Comments)    Reaction:  Hand swelling     The results of significant diagnostics from this  hospitalization (including imaging, microbiology, ancillary and laboratory) are listed below for reference.    Microbiology: Recent Results (from the past 240 hour(s))  Resp Panel by RT-PCR (Flu A&B, Covid) Anterior Nasal Swab     Status: None   Collection Time: 03/15/22  2:42 AM   Specimen: Anterior Nasal Swab  Result Value Ref Range Status   SARS Coronavirus 2 by RT PCR NEGATIVE NEGATIVE Final    Comment: (NOTE) SARS-CoV-2 target nucleic acids are NOT DETECTED.  The SARS-CoV-2 RNA is generally detectable in upper respiratory specimens during the acute phase of infection. The lowest concentration of SARS-CoV-2 viral copies this assay can detect is 138 copies/mL. A negative result does not preclude SARS-Cov-2 infection and should not be used as the sole basis for treatment or other patient management decisions. A negative result may occur with  improper specimen collection/handling, submission of specimen other than nasopharyngeal swab, presence of viral mutation(s) within the areas targeted by this assay, and inadequate number of viral copies(<138 copies/mL). A negative result must be combined with clinical observations, patient history, and epidemiological information. The expected result is Negative.  Fact Sheet for Patients:  EntrepreneurPulse.com.au  Fact Sheet for Healthcare Providers:  IncredibleEmployment.be  This test is no t yet approved or cleared by the [place] FDA and  has been authorized for detection and/or diagnosis of SARS-CoV-2 by FDA under an Emergency Use Authorization (EUA). This EUA will remain  in effect (meaning this test can be used) for the duration of the COVID-19 declaration under Section 564(b)(1) of the Act, 21 U.S.C.section 360bbb-3(b)(1), unless the authorization is terminated  or revoked sooner.       Influenza A by PCR NEGATIVE NEGATIVE Final   Influenza B by PCR NEGATIVE NEGATIVE Final    Comment:  (NOTE) The Xpert Xpress SARS-CoV-2/FLU/RSV plus assay is intended as an aid in the diagnosis of influenza from Nasopharyngeal swab specimens and should not be used as a sole basis for treatment. Nasal washings and aspirates are unacceptable for Xpert Xpress SARS-CoV-2/FLU/RSV testing.  Fact Sheet for Patients: EntrepreneurPulse.com.au  Fact Sheet for Healthcare Providers: IncredibleEmployment.be  This test is not yet approved or cleared by the [place] FDA and has been authorized for detection and/or diagnosis of SARS-CoV-2 by FDA under an Emergency Use Authorization (EUA). This EUA will remain in effect (meaning this test can be used) for the duration of the COVID-19 declaration under Section 564(b)(1) of the Act, 21 U.S.C. section 360bbb-3(b)(1), unless the authorization is terminated or  revoked.  Performed at [facility], [place] [address], [place], [place] [address]   Group A Strep by PCR     Status: None   Collection Time: 03/15/22  3:43 AM   Specimen: Throat; Sterile Swab  Result Value Ref Range Status   Group A Strep by PCR NOT DETECTED NOT DETECTED Final    Comment: Performed at [facility], [place] [address], [place], [place] [address]  MRSA Next Gen by PCR, Nasal     Status: None   Collection Time: 03/15/22  7:08 AM   Specimen: Nasal Mucosa; Nasal Swab  Result Value Ref Range Status   MRSA by PCR Next Gen NOT DETECTED NOT DETECTED Final    Comment: (NOTE) The GeneXpert MRSA Assay (FDA approved for NASAL specimens only), is one component of a comprehensive MRSA colonization surveillance program. It is not intended to diagnose MRSA infection nor to guide or monitor treatment for MRSA infections. Test performance is not FDA approved in patients less than 11 years old. Performed at [facility], [place] [address] [place], [place] [address]   Culture, blood (Routine X 2) w  Reflex to ID Panel     Status: None   Collection Time: 03/15/22  7:19 AM   Specimen: BLOOD  Result Value Ref Range Status   Specimen Description   Final    BLOOD BLOOD LEFT FOREARM Performed at [place] [address] [place], [place] [address]    Special Requests   Final    BOTTLES DRAWN AEROBIC ONLY Blood Culture adequate volume Performed at [place] [address], [place], [place] [address]    Culture   Final    NO GROWTH 5 DAYS Performed at [facility], [place] [address] [place], [place] [address]    Report Status 03/20/2022 FINAL  Final  Culture, blood (Routine X 2) w Reflex to ID Panel     Status: None   Collection Time: 03/15/22  7:19 AM   Specimen: BLOOD LEFT HAND  Result Value Ref Range Status   Specimen Description   Final    BLOOD LEFT HAND Performed at [place] [address] [place], [place] [address]    Special Requests   Final    BOTTLES DRAWN AEROBIC ONLY Blood Culture adequate volume Performed at [place] [address] [place], [place] [address]    Culture   Final    NO GROWTH 5 DAYS Performed at [facility], [place] [address], [place],  [address]    Report Status 03/20/2022 FINAL  Final    Procedures/Studies: DG Swallowing Func-Speech Pathology  Result Date: 03/22/2022 Table formatting from the original result was not included. Objective Swallowing Evaluation: Type of Study: MBS-Modified Barium Swallow Study  Patient Details Name: [name] [name] MRN: [id number] Date of Birth: 1924/02/06 Today's Date: 03/22/2022 Time: SLP Start Time (ACUTE ONLY): 77 -SLP Stop Time (ACUTE ONLY): 0300 SLP Time Calculation (min) (ACUTE ONLY): 25 min Past Medical History: Past Medical History: Diagnosis Date  Bone marrow disease   BPH (benign prostatic hypertrophy)   Depression   DJD (degenerative joint disease)   Dyslipidemia   Gout   HTN (hypertension)   Mild aortic  stenosis   Polycythemia vera(238.4)  Past Surgical History: Past Surgical History: Procedure Laterality Date  ENDOVENOUS ABLATION SAPHENOUS VEIN W/ LASER Left 09/04/2016  endovenous laser ablation left greater saphenous vein by Tinnie Gens MD  HEMORROIDECTOMY   HPI: 88 yo male adm to [facility] with respiratory issues, he states he had copius vomiting PTA that preempted his respiratory issues..  Now reports some throat discomfort - and appears significantly red at posterior soft palate with secretion retention. PMH + for HTN, DJD, gout, depression, BPH, polysythemia vera, lower limb varicose veins, thrrombocytopenia r/o PE.  Imaging negative for PE showed streaky dense opacity in lungs, small right pleural effusion, coarse calcification of LUL, no pneumothorax.  Swallow eval ordered. Pt admits to some issues with swallowing with food more than liquids.  Endorses h/o emergent endoscopy and dilation of lower esophageal sphincter approx 20 years ago.  He denies weight loss, requiring heimlich manueer or recurrent pneumonias. Pt reports eating softer foods at home - due to poor dentition, denies pill dysphagia.  Pt underwent BSE on 8/30 upon admit, reordered on 9/5 due to concern for overt apiration of cereal with milk.  CXR 03/21/2022 showed Improved aeration in the lung bases since 03/17/2022.  Subjective: pt awake in chair,  Recommendations for follow up therapy are one component of a multi-disciplinary discharge planning process, led by the attending physician.  Recommendations may be updated based on patient status, additional functional criteria and insurance authorization. Assessment / Plan / Recommendation   03/22/2022   4:53 PM Clinical Impressions Clinical Impression Patient presents with mild oropharyngeal dysphagia with sensorimotor deficits.  Impaired strength of oral bolus propulsion results in lingual pumping with purees and decreased clearance through pharynx.  Retention in pharynx across all consistencies present  - and without pt awareness/sensation.   Following solids with sips of liquids assisted to clear pharynx of solids that are largely at vallecular space.   Pt appears with potential cervical osteophyte that may contribute to impaired clearance into cervical esophagus.  Head turn left nor right improved pharyngeal clearance.   Trace flash laryngeal penetration of thin during swallow noted. No aspiration or frank deep laryngeal penetration.  Using teach back, educated pt to dysphagia and aspiration compensation strategies. Given his pharyngeal retention, suspect he does have occasional aspiration.  Advised he follow solids with liquids and avoid mixed consistencies.  SLP will follow briefly for swallow precaution training and will provide exercises to maximize his swallow rehab/pulmonary clearance.  Educated pt to findings using teach back and tubed swallow precaution signs the floor for nursing/staff to post in pt's room.  Thanks for this consult. SLP Visit Diagnosis Dysphagia, oropharyngeal phase (R13.12);Dysphagia, pharyngoesophageal phase (R13.14) Impact on safety and function Mild aspiration risk;Moderate aspiration risk     03/22/2022   4:53 PM Treatment Recommendations Treatment Recommendations Therapy as outlined in treatment plan below;F/U MBS in ___ days (Comment)     03/22/2022   5:01 PM Prognosis Prognosis for Safe Diet Advancement Fair Barriers to Reach Goals Time post onset   03/22/2022   4:53 PM Diet Recommendations SLP Diet Recommendations Dysphagia 3 (Mech soft) solids;Thin liquid- NO MIXED CONSISTENCIES Liquid Administration via Cup;Straw Medication Administration Whole meds with puree Compensations Slow rate;Small sips/bites;Follow solids with liquid Postural Changes Remain semi-upright after after feeds/meals (Comment);Seated upright at 90 degrees     03/22/2022   4:53 PM Other Recommendations Oral Care Recommendations Oral care BID Follow Up Recommendations Follow physician's recommendations for discharge  plan and follow up therapies Assistance recommended at discharge Frequent or constant Supervision/Assistance Functional Status Assessment Patient has had a recent decline in their functional status and demonstrates the ability to make significant improvements in function in a reasonable and predictable amount of time.  03/22/2022   4:53 PM Frequency and Duration  Speech Therapy Frequency (ACUTE ONLY) min 1 x/week Treatment Duration 1 week     03/22/2022   4:42 PM Oral Phase Oral Phase Impaired Oral - Puree Weak lingual manipulation;Lingual pumping;Reduced posterior propulsion    03/22/2022   4:42 PM Pharyngeal Phase Pharyngeal Phase Impaired Pharyngeal- Nectar Cup Pharyngeal residue - valleculae;Pharyngeal residue - pyriform Pharyngeal Material does not enter airway Pharyngeal- Thin Cup Pharyngeal residue - valleculae;Pharyngeal residue - pyriform;Lateral channel residue;Penetration/Aspiration during swallow Pharyngeal Material enters airway, remains ABOVE vocal cords then ejected out Pharyngeal- Thin Straw Pharyngeal residue - valleculae;Pharyngeal residue - pyriform;Lateral channel residue;Penetration/Aspiration during swallow Pharyngeal Material enters airway, remains ABOVE vocal cords then ejected out Pharyngeal- Puree Pharyngeal residue - valleculae;Reduced tongue base retraction;Reduced epiglottic inversion;Reduced pharyngeal peristalsis Pharyngeal Material does not enter airway Pharyngeal- Mechanical Soft Pharyngeal residue - valleculae;Reduced pharyngeal peristalsis;Pharyngeal residue - pyriform Pharyngeal Material does not enter airway Pharyngeal Comment Head turn right nor left improved pharyngeal clearance; Pt's chin is in natural tucked down posture with him requiring verbal cues to retain upright position; Following solids with several sips of liquids helped to decrease pharyngeal retention;  Pt did not sense pharyngeal retention nor did he cough during MBS.  Esophageal sweep completed showing appearance of  clear esophagus.    03/22/2022   4:46 PM Cervical Esophageal Phase  Cervical Esophageal Phase Impaired Cervical Esophageal Comment Appearance of C4-C5 protrusion into pharynx/cervical esophagus that may have contributed mildly to pt's retention [name] 03/22/2022, 5:03 PM             Kathleen Lime, MS [facility] SLP Acute Rehab Services Office [phone] Pager [phone]         DG CHEST PORT 1 VIEW  Result Date: 03/21/2022 CLINICAL DATA:  Shortness of breath EXAM: PORTABLE CHEST 1 VIEW COMPARISON:  Chest radiograph 03/17/2022 FINDINGS: The cardiomediastinal silhouette is stable. There is unchanged asymmetric elevation of the right hemidiaphragm. Aeration in the lung bases appears improved since 03/17/2022 there is no new or worsening focal airspace disease. A calcified granuloma in the left apex is unchanged. There is no significant pleural effusion. There is no pneumothorax The bones are stable. IMPRESSION: Improved aeration in the lung bases since 03/17/2022. Electronically Signed   By: Valetta Mole M.D.   On: 03/21/2022 08:34   DG CHEST PORT 1 VIEW  Result Date: 03/17/2022 CLINICAL DATA:  Shortness of breath. EXAM: PORTABLE CHEST 1 VIEW COMPARISON:  March 15, 2022 FINDINGS: Stable cardiac and mediastinal contours. Low lung volumes. Interval increase in bilateral interstitial pulmonary opacities. Possible trace right pleural effusion. No pneumothorax. Thoracic spine degenerative changes. IMPRESSION: Interval increase in bilateral airspace opacities suggestive of multifocal infection. Possible small right pleural effusion. Electronically Signed   By: Lovey Newcomer M.D.   On: 03/17/2022 07:48   VAS [place] LOWER EXTREMITY VENOUS (DVT)  Result Date: 03/15/2022  Lower Venous DVT Study Patient Name:  [name] [name]  Date of Exam:   03/15/2022 Medical Rec #: [id number]        Accession #:    [id number] Date of Birth: 04/29/1924        Patient Gender: M Patient Age:   6 years Exam Location:  [facility]  Procedure:      VAS [place] LOWER EXTREMITY VENOUS (DVT) Referring Phys: Archie Patten HALL --------------------------------------------------------------------------------  Indications: Swelling, and Edema.  Comparison Study: no prior Performing Technologist: Archie Patten RVS  Examination Guidelines: A complete evaluation includes B-mode imaging, spectral Doppler, color  Doppler, and power Doppler as needed of all accessible portions of each vessel. Bilateral testing is considered an integral part of a complete examination. Limited examinations for reoccurring indications may be performed as noted. The reflux portion of the exam is performed with the patient in reverse Trendelenburg.  +---------+---------------+---------+-----------+----------+--------------+ RIGHT    CompressibilityPhasicitySpontaneityPropertiesThrombus Aging +---------+---------------+---------+-----------+----------+--------------+ CFV      Full           Yes      Yes                                 +---------+---------------+---------+-----------+----------+--------------+ SFJ      Full                                                        +---------+---------------+---------+-----------+----------+--------------+ FV Prox  Full                                                        +---------+---------------+---------+-----------+----------+--------------+ FV Mid   Full                                                        +---------+---------------+---------+-----------+----------+--------------+ FV DistalFull                                                        +---------+---------------+---------+-----------+----------+--------------+ PFV      Full                                                        +---------+---------------+---------+-----------+----------+--------------+ POP      Full           Yes      Yes                                  +---------+---------------+---------+-----------+----------+--------------+ PTV      Full                                                        +---------+---------------+---------+-----------+----------+--------------+ PERO     Full                                                        +---------+---------------+---------+-----------+----------+--------------+   +---------+---------------+---------+-----------+----------+--------------+  LEFT     CompressibilityPhasicitySpontaneityPropertiesThrombus Aging +---------+---------------+---------+-----------+----------+--------------+ CFV      Full           Yes      Yes                                 +---------+---------------+---------+-----------+----------+--------------+ SFJ      Full                                                        +---------+---------------+---------+-----------+----------+--------------+ FV Prox  Full                                                        +---------+---------------+---------+-----------+----------+--------------+ FV Mid   Full                                                        +---------+---------------+---------+-----------+----------+--------------+ FV DistalFull                                                        +---------+---------------+---------+-----------+----------+--------------+ PFV      Full                                                        +---------+---------------+---------+-----------+----------+--------------+ POP      Full           Yes      Yes                                 +---------+---------------+---------+-----------+----------+--------------+ PTV      Full                                                        +---------+---------------+---------+-----------+----------+--------------+ PERO     Full                                                         +---------+---------------+---------+-----------+----------+--------------+     Summary: BILATERAL: - No evidence of deep vein thrombosis seen in the lower extremities, bilaterally. -No evidence of popliteal cyst, bilaterally.   *See table(s) above for measurements and observations. Electronically signed by Jamelle Haring on 03/15/2022 at 4:49:12 PM.  Final    CT Angio Chest Pulmonary Embolism (PE) W or WO Contrast  Result Date: 03/15/2022 CLINICAL DATA:  Pulmonary embolism suspected. Several hours of shortness of breath. EXAM: CT ANGIOGRAPHY CHEST WITH CONTRAST TECHNIQUE: Multidetector CT imaging of the chest was performed using the standard protocol during bolus administration of intravenous contrast. Multiplanar CT image reconstructions and MIPs were obtained to evaluate the vascular anatomy. RADIATION DOSE REDUCTION: This exam was performed according to the departmental dose-optimization program which includes automated exposure control, adjustment of the mA and/or kV according to patient size and/or use of iterative reconstruction technique. CONTRAST:  71m OMNIPAQUE IOHEXOL 350 MG/ML SOLN COMPARISON:  10/19/2016 FINDINGS: Cardiovascular: Satisfactory opacification of the pulmonary arteries to the segmental level. No evidence of pulmonary embolism. Borderline heart size. No pericardial effusion. Notable calcification of the aortic valve. Aortic and coronary atherosclerosis. Mediastinum/Nodes: Borderline thickening of subcarinal node, presumably reactive in the setting. Lungs/Pleura: Airway filling of bilateral lower lobe bronchi and right middle lobe bronchi. Streaky density and airspace opacity in the associated lungs. Small right pleural effusion. Coarse calcification in the left upper lobe. No pneumothorax. Upper Abdomen: Partially covered spleen which is enlarged with 13 cm length. Shape and size is similar to 2018. Musculoskeletal: No acute or aggressive finding. Review of the MIP images confirms the  above findings. IMPRESSION: 1. Extensive airway filling in the lower lungs with bilateral atelectasis and pneumonia. 2. Negative for pulmonary embolism. 3. Aortic and coronary atherosclerosis. Electronically Signed   By: JJorje GuildM.D.   On: 03/15/2022 07:14   DG Chest Port 1 View  Result Date: 03/15/2022 CLINICAL DATA:  Shortness of breath. EXAM: PORTABLE CHEST 1 VIEW COMPARISON:  10/19/2016. FINDINGS: The heart size and mediastinal contours are within normal limits. There is atherosclerotic calcification of the aorta. Stable calcified granuloma is present in the left upper lobe. Lung volumes are low with atelectasis at the lung bases. No effusion or pneumothorax. No acute osseous abnormality. IMPRESSION: Low lung volumes with mild atelectasis at the lung bases. Electronically Signed   By: LBrett FairyM.D.   On: 03/15/2022 02:51    Labs: BNP (last 3 results) Recent Labs    03/15/22 0236  BNP 5426.8   Basic Metabolic Panel: Recent Labs  Lab 03/18/22 0311 03/19/22 0312 03/20/22 0455 03/21/22 0507 03/22/22 0838 03/23/22 0730  NA 140 139 139 141 141 141  K 4.1 3.8 3.7 4.0 3.0* 4.2  CL 108 107 106 108 115* 111  CO2 '25 24 26 24 '$ 21* 21*  GLUCOSE 101* 94 107* 80 77 79  BUN 39* 31* 29* 33* 28* 26*  CREATININE 1.43* 1.30* 1.20 1.33* 1.12 1.22  CALCIUM 8.4* 8.4* 8.7* 8.4* 6.5* 8.4*  MG 1.9  --   --  1.9 1.6*  --    Liver Function Tests: No results for input(s): "AST", "ALT", "ALKPHOS", "BILITOT", "PROT", "ALBUMIN" in the last 168 hours. No results for input(s): "LIPASE", "AMYLASE" in the last 168 hours. No results for input(s): "AMMONIA" in the last 168 hours. CBC: Recent Labs  Lab 03/19/22 0312 03/20/22 0455 03/21/22 0507 03/22/22 0838 03/23/22 0730  WBC 20.1* 24.7* 29.2* 25.9* 25.9*  HGB 13.2 14.6 14.2 13.6 14.2  HCT 46.8 51.5 49.8 49.2 49.7  MCV 87.6 87.0 87.2 87.7 87.2  PLT 16* 14* 17* 15* 15*   Cardiac Enzymes: No results for input(s): "CKTOTAL", "CKMB",  "CKMBINDEX", "TROPONINI" in the last 168 hours. BNP: Invalid input(s): "POCBNP" CBG: No results for input(s): "GLUCAP" in the last  168 hours. D-Dimer No results for input(s): "DDIMER" in the last 72 hours. Hgb A1c No results for input(s): "HGBA1C" in the last 72 hours. Lipid Profile No results for input(s): "CHOL", "HDL", "LDLCALC", "TRIG", "CHOLHDL", "LDLDIRECT" in the last 72 hours. Thyroid function studies No results for input(s): "TSH", "T4TOTAL", "T3FREE", "THYROIDAB" in the last 72 hours.  Invalid input(s): "FREET3" Anemia work up No results for input(s): "VITAMINB12", "FOLATE", "FERRITIN", "TIBC", "IRON", "RETICCTPCT" in the last 72 hours. Urinalysis    Component Value Date/Time   COLORURINE YELLOW 03/16/2022 0255   APPEARANCEUR CLEAR 03/16/2022 0255   LABSPEC 1.032 (H) 03/16/2022 0255   PHURINE 5.0 03/16/2022 0255   GLUCOSEU NEGATIVE 03/16/2022 0255   HGBUR LARGE (A) 03/16/2022 0255   BILIRUBINUR NEGATIVE 03/16/2022 0255   KETONESUR NEGATIVE 03/16/2022 0255   PROTEINUR 30 (A) 03/16/2022 0255   NITRITE NEGATIVE 03/16/2022 0255   LEUKOCYTESUR NEGATIVE 03/16/2022 0255   Sepsis Labs Recent Labs  Lab 03/20/22 0455 03/21/22 0507 03/22/22 0838 03/23/22 0730  WBC 24.7* 29.2* 25.9* 25.9*   Microbiology Recent Results (from the past 240 hour(s))  Resp Panel by RT-PCR (Flu A&B, Covid) Anterior Nasal Swab     Status: None   Collection Time: 03/15/22  2:42 AM   Specimen: Anterior Nasal Swab  Result Value Ref Range Status   SARS Coronavirus 2 by RT PCR NEGATIVE NEGATIVE Final    Comment: (NOTE) SARS-CoV-2 target nucleic acids are NOT DETECTED.  The SARS-CoV-2 RNA is generally detectable in upper respiratory specimens during the acute phase of infection. The lowest concentration of SARS-CoV-2 viral copies this assay can detect is 138 copies/mL. A negative result does not preclude SARS-Cov-2 infection and should not be used as the sole basis for treatment or other  patient management decisions. A negative result may occur with  improper specimen collection/handling, submission of specimen other than nasopharyngeal swab, presence of viral mutation(s) within the areas targeted by this assay, and inadequate number of viral copies(<138 copies/mL). A negative result must be combined with clinical observations, patient history, and epidemiological information. The expected result is Negative.  Fact Sheet for Patients:  EntrepreneurPulse.com.au  Fact Sheet for Healthcare Providers:  IncredibleEmployment.be  This test is no t yet approved or cleared by the [place] FDA and  has been authorized for detection and/or diagnosis of SARS-CoV-2 by FDA under an Emergency Use Authorization (EUA). This EUA will remain  in effect (meaning this test can be used) for the duration of the COVID-19 declaration under Section 564(b)(1) of the Act, 21 U.S.C.section 360bbb-3(b)(1), unless the authorization is terminated  or revoked sooner.       Influenza A by PCR NEGATIVE NEGATIVE Final   Influenza B by PCR NEGATIVE NEGATIVE Final    Comment: (NOTE) The Xpert Xpress SARS-CoV-2/FLU/RSV plus assay is intended as an aid in the diagnosis of influenza from Nasopharyngeal swab specimens and should not be used as a sole basis for treatment. Nasal washings and aspirates are unacceptable for Xpert Xpress SARS-CoV-2/FLU/RSV testing.  Fact Sheet for Patients: EntrepreneurPulse.com.au  Fact Sheet for Healthcare Providers: IncredibleEmployment.be  This test is not yet approved or cleared by the [place] FDA and has been authorized for detection and/or diagnosis of SARS-CoV-2 by FDA under an Emergency Use Authorization (EUA). This EUA will remain in effect (meaning this test can be used) for the duration of the COVID-19 declaration under Section 564(b)(1) of the Act, 21 U.S.C. section  360bbb-3(b)(1), unless the authorization is terminated or revoked.  Performed at [facility]  [place] [address], [place], [place] [address]   Group A Strep by PCR     Status: None   Collection Time: 03/15/22  3:43 AM   Specimen: Throat; Sterile Swab  Result Value Ref Range Status   Group A Strep by PCR NOT DETECTED NOT DETECTED Final    Comment: Performed at [facility], [place] [address] [place], [place] [address]  MRSA Next Gen by PCR, Nasal     Status: None   Collection Time: 03/15/22  7:08 AM   Specimen: Nasal Mucosa; Nasal Swab  Result Value Ref Range Status   MRSA by PCR Next Gen NOT DETECTED NOT DETECTED Final    Comment: (NOTE) The GeneXpert MRSA Assay (FDA approved for NASAL specimens only), is one component of a comprehensive MRSA colonization surveillance program. It is not intended to diagnose MRSA infection nor to guide or monitor treatment for MRSA infections. Test performance is not FDA approved in patients less than 54 years old. Performed at [facility], [place] [address] [place], [place] [address]   Culture, blood (Routine X 2) w Reflex to ID Panel     Status: None   Collection Time: 03/15/22  7:19 AM   Specimen: BLOOD  Result Value Ref Range Status   Specimen Description   Final    BLOOD BLOOD LEFT FOREARM Performed at [place] [address], [place], [place] [address]    Special Requests   Final    BOTTLES DRAWN AEROBIC ONLY Blood Culture adequate volume Performed at [place] [address], [place], [place] [address]    Culture   Final    NO GROWTH 5 DAYS Performed at [facility], [place] [address] [place], [place] [address]    Report Status 03/20/2022 FINAL  Final  Culture, blood (Routine X 2) w Reflex to ID Panel     Status: None   Collection Time: 03/15/22  7:19 AM   Specimen: BLOOD LEFT HAND  Result Value Ref Range Status   Specimen  Description   Final    BLOOD LEFT HAND Performed at [place] [address] [place], [place] [address]    Special Requests   Final    BOTTLES DRAWN AEROBIC ONLY Blood Culture adequate volume Performed at [place] [address] [place], [place] [address]    Culture   Final    NO GROWTH 5 DAYS Performed at [facility], [place] [address], [place], [place] [address]    Report Status 03/20/2022 FINAL  Final     Time coordinating discharge: 35 minutes  SIGNED: Antonieta Pert, MD  [facility] 03/23/2022, 12:54 PM  If 7PM-7AM, please contact night-coverage [email or web address]

## 2022-04-06 DIAGNOSIS — R338 Other retention of urine: Secondary | ICD-10-CM | POA: Diagnosis not present

## 2022-04-26 DIAGNOSIS — N481 Balanitis: Secondary | ICD-10-CM | POA: Diagnosis not present

## 2022-04-26 DIAGNOSIS — R338 Other retention of urine: Secondary | ICD-10-CM | POA: Diagnosis not present

## 2022-05-12 DIAGNOSIS — R338 Other retention of urine: Secondary | ICD-10-CM | POA: Diagnosis not present

## 2022-05-21 DIAGNOSIS — M109 Gout, unspecified: Secondary | ICD-10-CM | POA: Diagnosis not present

## 2022-05-21 DIAGNOSIS — I2510 Atherosclerotic heart disease of native coronary artery without angina pectoris: Secondary | ICD-10-CM | POA: Diagnosis not present

## 2022-05-21 DIAGNOSIS — R159 Full incontinence of feces: Secondary | ICD-10-CM | POA: Diagnosis not present

## 2022-05-21 DIAGNOSIS — Z9981 Dependence on supplemental oxygen: Secondary | ICD-10-CM | POA: Diagnosis not present

## 2022-05-21 DIAGNOSIS — I69818 Other symptoms and signs involving cognitive functions following other cerebrovascular disease: Secondary | ICD-10-CM | POA: Diagnosis not present

## 2022-05-21 DIAGNOSIS — Z960 Presence of urogenital implants: Secondary | ICD-10-CM | POA: Diagnosis not present

## 2022-05-21 DIAGNOSIS — R32 Unspecified urinary incontinence: Secondary | ICD-10-CM | POA: Diagnosis not present

## 2022-05-21 DIAGNOSIS — N400 Enlarged prostate without lower urinary tract symptoms: Secondary | ICD-10-CM | POA: Diagnosis not present

## 2022-05-21 DIAGNOSIS — F32A Depression, unspecified: Secondary | ICD-10-CM | POA: Diagnosis not present

## 2022-05-21 DIAGNOSIS — D696 Thrombocytopenia, unspecified: Secondary | ICD-10-CM | POA: Diagnosis not present

## 2022-05-21 DIAGNOSIS — J9610 Chronic respiratory failure, unspecified whether with hypoxia or hypercapnia: Secondary | ICD-10-CM | POA: Diagnosis not present

## 2022-05-22 DIAGNOSIS — R32 Unspecified urinary incontinence: Secondary | ICD-10-CM | POA: Diagnosis not present

## 2022-05-22 DIAGNOSIS — D696 Thrombocytopenia, unspecified: Secondary | ICD-10-CM | POA: Diagnosis not present

## 2022-05-22 DIAGNOSIS — J9610 Chronic respiratory failure, unspecified whether with hypoxia or hypercapnia: Secondary | ICD-10-CM | POA: Diagnosis not present

## 2022-05-22 DIAGNOSIS — I69818 Other symptoms and signs involving cognitive functions following other cerebrovascular disease: Secondary | ICD-10-CM | POA: Diagnosis not present

## 2022-05-22 DIAGNOSIS — F32A Depression, unspecified: Secondary | ICD-10-CM | POA: Diagnosis not present

## 2022-05-22 DIAGNOSIS — I2510 Atherosclerotic heart disease of native coronary artery without angina pectoris: Secondary | ICD-10-CM | POA: Diagnosis not present

## 2022-05-23 ENCOUNTER — Telehealth: Payer: Self-pay

## 2022-05-23 ENCOUNTER — Other Ambulatory Visit: Payer: Self-pay

## 2022-05-23 NOTE — Telephone Encounter (Signed)
[name] [name] with [facility] called stating that the pt is on Hospice and pt & family wants to know if the pt still needs to come in for the pt's therapeutic phlebotomies.  [name] [name] stated she needs to check with the Hospice Provider to make sure he's not to get these d/t pt being on hospice which is not cover by Hospice and the pt's insurance.  [name] [name] she will contact Dr. Ernestina Penna office with the answer from the Hospice Provider.  Notified Dr. Burr Medico of the pt being on Hospice.

## 2022-05-24 ENCOUNTER — Other Ambulatory Visit: Payer: Self-pay

## 2022-05-24 DIAGNOSIS — I69818 Other symptoms and signs involving cognitive functions following other cerebrovascular disease: Secondary | ICD-10-CM | POA: Diagnosis not present

## 2022-05-24 DIAGNOSIS — R32 Unspecified urinary incontinence: Secondary | ICD-10-CM | POA: Diagnosis not present

## 2022-05-24 DIAGNOSIS — I2510 Atherosclerotic heart disease of native coronary artery without angina pectoris: Secondary | ICD-10-CM | POA: Diagnosis not present

## 2022-05-24 DIAGNOSIS — F32A Depression, unspecified: Secondary | ICD-10-CM | POA: Diagnosis not present

## 2022-05-24 DIAGNOSIS — D696 Thrombocytopenia, unspecified: Secondary | ICD-10-CM | POA: Diagnosis not present

## 2022-05-24 DIAGNOSIS — J9610 Chronic respiratory failure, unspecified whether with hypoxia or hypercapnia: Secondary | ICD-10-CM | POA: Diagnosis not present

## 2022-05-26 DIAGNOSIS — J9610 Chronic respiratory failure, unspecified whether with hypoxia or hypercapnia: Secondary | ICD-10-CM | POA: Diagnosis not present

## 2022-05-26 DIAGNOSIS — I69818 Other symptoms and signs involving cognitive functions following other cerebrovascular disease: Secondary | ICD-10-CM | POA: Diagnosis not present

## 2022-05-26 DIAGNOSIS — I2510 Atherosclerotic heart disease of native coronary artery without angina pectoris: Secondary | ICD-10-CM | POA: Diagnosis not present

## 2022-05-26 DIAGNOSIS — F32A Depression, unspecified: Secondary | ICD-10-CM | POA: Diagnosis not present

## 2022-05-26 DIAGNOSIS — D696 Thrombocytopenia, unspecified: Secondary | ICD-10-CM | POA: Diagnosis not present

## 2022-05-26 DIAGNOSIS — R32 Unspecified urinary incontinence: Secondary | ICD-10-CM | POA: Diagnosis not present

## 2022-05-30 DIAGNOSIS — I2510 Atherosclerotic heart disease of native coronary artery without angina pectoris: Secondary | ICD-10-CM | POA: Diagnosis not present

## 2022-05-30 DIAGNOSIS — F32A Depression, unspecified: Secondary | ICD-10-CM | POA: Diagnosis not present

## 2022-05-30 DIAGNOSIS — D696 Thrombocytopenia, unspecified: Secondary | ICD-10-CM | POA: Diagnosis not present

## 2022-05-30 DIAGNOSIS — J9610 Chronic respiratory failure, unspecified whether with hypoxia or hypercapnia: Secondary | ICD-10-CM | POA: Diagnosis not present

## 2022-05-30 DIAGNOSIS — R32 Unspecified urinary incontinence: Secondary | ICD-10-CM | POA: Diagnosis not present

## 2022-05-30 DIAGNOSIS — I69818 Other symptoms and signs involving cognitive functions following other cerebrovascular disease: Secondary | ICD-10-CM | POA: Diagnosis not present

## 2022-05-31 DIAGNOSIS — J9610 Chronic respiratory failure, unspecified whether with hypoxia or hypercapnia: Secondary | ICD-10-CM | POA: Diagnosis not present

## 2022-05-31 DIAGNOSIS — I2510 Atherosclerotic heart disease of native coronary artery without angina pectoris: Secondary | ICD-10-CM | POA: Diagnosis not present

## 2022-05-31 DIAGNOSIS — D696 Thrombocytopenia, unspecified: Secondary | ICD-10-CM | POA: Diagnosis not present

## 2022-05-31 DIAGNOSIS — I69818 Other symptoms and signs involving cognitive functions following other cerebrovascular disease: Secondary | ICD-10-CM | POA: Diagnosis not present

## 2022-05-31 DIAGNOSIS — R32 Unspecified urinary incontinence: Secondary | ICD-10-CM | POA: Diagnosis not present

## 2022-05-31 DIAGNOSIS — F32A Depression, unspecified: Secondary | ICD-10-CM | POA: Diagnosis not present

## 2022-06-01 ENCOUNTER — Other Ambulatory Visit: Payer: Medicare Other

## 2022-06-01 ENCOUNTER — Ambulatory Visit: Payer: Medicare Other

## 2022-06-02 DIAGNOSIS — J9610 Chronic respiratory failure, unspecified whether with hypoxia or hypercapnia: Secondary | ICD-10-CM | POA: Diagnosis not present

## 2022-06-02 DIAGNOSIS — I2510 Atherosclerotic heart disease of native coronary artery without angina pectoris: Secondary | ICD-10-CM | POA: Diagnosis not present

## 2022-06-02 DIAGNOSIS — I69818 Other symptoms and signs involving cognitive functions following other cerebrovascular disease: Secondary | ICD-10-CM | POA: Diagnosis not present

## 2022-06-02 DIAGNOSIS — R32 Unspecified urinary incontinence: Secondary | ICD-10-CM | POA: Diagnosis not present

## 2022-06-02 DIAGNOSIS — D696 Thrombocytopenia, unspecified: Secondary | ICD-10-CM | POA: Diagnosis not present

## 2022-06-02 DIAGNOSIS — F32A Depression, unspecified: Secondary | ICD-10-CM | POA: Diagnosis not present

## 2022-06-06 DIAGNOSIS — I69818 Other symptoms and signs involving cognitive functions following other cerebrovascular disease: Secondary | ICD-10-CM | POA: Diagnosis not present

## 2022-06-06 DIAGNOSIS — F32A Depression, unspecified: Secondary | ICD-10-CM | POA: Diagnosis not present

## 2022-06-06 DIAGNOSIS — R32 Unspecified urinary incontinence: Secondary | ICD-10-CM | POA: Diagnosis not present

## 2022-06-06 DIAGNOSIS — J9610 Chronic respiratory failure, unspecified whether with hypoxia or hypercapnia: Secondary | ICD-10-CM | POA: Diagnosis not present

## 2022-06-06 DIAGNOSIS — D696 Thrombocytopenia, unspecified: Secondary | ICD-10-CM | POA: Diagnosis not present

## 2022-06-06 DIAGNOSIS — I2510 Atherosclerotic heart disease of native coronary artery without angina pectoris: Secondary | ICD-10-CM | POA: Diagnosis not present

## 2022-06-07 DIAGNOSIS — F32A Depression, unspecified: Secondary | ICD-10-CM | POA: Diagnosis not present

## 2022-06-07 DIAGNOSIS — R32 Unspecified urinary incontinence: Secondary | ICD-10-CM | POA: Diagnosis not present

## 2022-06-07 DIAGNOSIS — I2510 Atherosclerotic heart disease of native coronary artery without angina pectoris: Secondary | ICD-10-CM | POA: Diagnosis not present

## 2022-06-07 DIAGNOSIS — I69818 Other symptoms and signs involving cognitive functions following other cerebrovascular disease: Secondary | ICD-10-CM | POA: Diagnosis not present

## 2022-06-07 DIAGNOSIS — J9610 Chronic respiratory failure, unspecified whether with hypoxia or hypercapnia: Secondary | ICD-10-CM | POA: Diagnosis not present

## 2022-06-07 DIAGNOSIS — D696 Thrombocytopenia, unspecified: Secondary | ICD-10-CM | POA: Diagnosis not present

## 2022-06-09 DIAGNOSIS — I69818 Other symptoms and signs involving cognitive functions following other cerebrovascular disease: Secondary | ICD-10-CM | POA: Diagnosis not present

## 2022-06-09 DIAGNOSIS — I2510 Atherosclerotic heart disease of native coronary artery without angina pectoris: Secondary | ICD-10-CM | POA: Diagnosis not present

## 2022-06-09 DIAGNOSIS — F32A Depression, unspecified: Secondary | ICD-10-CM | POA: Diagnosis not present

## 2022-06-09 DIAGNOSIS — J9610 Chronic respiratory failure, unspecified whether with hypoxia or hypercapnia: Secondary | ICD-10-CM | POA: Diagnosis not present

## 2022-06-09 DIAGNOSIS — D696 Thrombocytopenia, unspecified: Secondary | ICD-10-CM | POA: Diagnosis not present

## 2022-06-09 DIAGNOSIS — R32 Unspecified urinary incontinence: Secondary | ICD-10-CM | POA: Diagnosis not present

## 2022-06-14 DIAGNOSIS — F32A Depression, unspecified: Secondary | ICD-10-CM | POA: Diagnosis not present

## 2022-06-14 DIAGNOSIS — R32 Unspecified urinary incontinence: Secondary | ICD-10-CM | POA: Diagnosis not present

## 2022-06-14 DIAGNOSIS — J9610 Chronic respiratory failure, unspecified whether with hypoxia or hypercapnia: Secondary | ICD-10-CM | POA: Diagnosis not present

## 2022-06-14 DIAGNOSIS — I69818 Other symptoms and signs involving cognitive functions following other cerebrovascular disease: Secondary | ICD-10-CM | POA: Diagnosis not present

## 2022-06-14 DIAGNOSIS — D696 Thrombocytopenia, unspecified: Secondary | ICD-10-CM | POA: Diagnosis not present

## 2022-06-14 DIAGNOSIS — I2510 Atherosclerotic heart disease of native coronary artery without angina pectoris: Secondary | ICD-10-CM | POA: Diagnosis not present

## 2022-06-15 DIAGNOSIS — J9610 Chronic respiratory failure, unspecified whether with hypoxia or hypercapnia: Secondary | ICD-10-CM | POA: Diagnosis not present

## 2022-06-15 DIAGNOSIS — I69818 Other symptoms and signs involving cognitive functions following other cerebrovascular disease: Secondary | ICD-10-CM | POA: Diagnosis not present

## 2022-06-15 DIAGNOSIS — F32A Depression, unspecified: Secondary | ICD-10-CM | POA: Diagnosis not present

## 2022-06-15 DIAGNOSIS — D696 Thrombocytopenia, unspecified: Secondary | ICD-10-CM | POA: Diagnosis not present

## 2022-06-15 DIAGNOSIS — I2510 Atherosclerotic heart disease of native coronary artery without angina pectoris: Secondary | ICD-10-CM | POA: Diagnosis not present

## 2022-06-15 DIAGNOSIS — R32 Unspecified urinary incontinence: Secondary | ICD-10-CM | POA: Diagnosis not present

## 2022-06-16 DIAGNOSIS — R159 Full incontinence of feces: Secondary | ICD-10-CM | POA: Diagnosis not present

## 2022-06-16 DIAGNOSIS — J9610 Chronic respiratory failure, unspecified whether with hypoxia or hypercapnia: Secondary | ICD-10-CM | POA: Diagnosis not present

## 2022-06-16 DIAGNOSIS — D696 Thrombocytopenia, unspecified: Secondary | ICD-10-CM | POA: Diagnosis not present

## 2022-06-16 DIAGNOSIS — R32 Unspecified urinary incontinence: Secondary | ICD-10-CM | POA: Diagnosis not present

## 2022-06-16 DIAGNOSIS — I2510 Atherosclerotic heart disease of native coronary artery without angina pectoris: Secondary | ICD-10-CM | POA: Diagnosis not present

## 2022-06-16 DIAGNOSIS — M109 Gout, unspecified: Secondary | ICD-10-CM | POA: Diagnosis not present

## 2022-06-16 DIAGNOSIS — D45 Polycythemia vera: Secondary | ICD-10-CM | POA: Diagnosis not present

## 2022-06-16 DIAGNOSIS — Z9981 Dependence on supplemental oxygen: Secondary | ICD-10-CM | POA: Diagnosis not present

## 2022-06-16 DIAGNOSIS — F32A Depression, unspecified: Secondary | ICD-10-CM | POA: Diagnosis not present

## 2022-06-16 DIAGNOSIS — N400 Enlarged prostate without lower urinary tract symptoms: Secondary | ICD-10-CM | POA: Diagnosis not present

## 2022-06-16 DIAGNOSIS — Z960 Presence of urogenital implants: Secondary | ICD-10-CM | POA: Diagnosis not present

## 2022-06-16 DIAGNOSIS — I69818 Other symptoms and signs involving cognitive functions following other cerebrovascular disease: Secondary | ICD-10-CM | POA: Diagnosis not present

## 2022-06-18 DIAGNOSIS — F32A Depression, unspecified: Secondary | ICD-10-CM | POA: Diagnosis not present

## 2022-06-18 DIAGNOSIS — J9610 Chronic respiratory failure, unspecified whether with hypoxia or hypercapnia: Secondary | ICD-10-CM | POA: Diagnosis not present

## 2022-06-18 DIAGNOSIS — I2510 Atherosclerotic heart disease of native coronary artery without angina pectoris: Secondary | ICD-10-CM | POA: Diagnosis not present

## 2022-06-18 DIAGNOSIS — R32 Unspecified urinary incontinence: Secondary | ICD-10-CM | POA: Diagnosis not present

## 2022-06-18 DIAGNOSIS — I69818 Other symptoms and signs involving cognitive functions following other cerebrovascular disease: Secondary | ICD-10-CM | POA: Diagnosis not present

## 2022-06-18 DIAGNOSIS — D696 Thrombocytopenia, unspecified: Secondary | ICD-10-CM | POA: Diagnosis not present

## 2022-06-19 DIAGNOSIS — J9610 Chronic respiratory failure, unspecified whether with hypoxia or hypercapnia: Secondary | ICD-10-CM | POA: Diagnosis not present

## 2022-06-19 DIAGNOSIS — F32A Depression, unspecified: Secondary | ICD-10-CM | POA: Diagnosis not present

## 2022-06-19 DIAGNOSIS — R32 Unspecified urinary incontinence: Secondary | ICD-10-CM | POA: Diagnosis not present

## 2022-06-19 DIAGNOSIS — I69818 Other symptoms and signs involving cognitive functions following other cerebrovascular disease: Secondary | ICD-10-CM | POA: Diagnosis not present

## 2022-06-19 DIAGNOSIS — D696 Thrombocytopenia, unspecified: Secondary | ICD-10-CM | POA: Diagnosis not present

## 2022-06-19 DIAGNOSIS — I2510 Atherosclerotic heart disease of native coronary artery without angina pectoris: Secondary | ICD-10-CM | POA: Diagnosis not present

## 2022-06-20 DIAGNOSIS — I69818 Other symptoms and signs involving cognitive functions following other cerebrovascular disease: Secondary | ICD-10-CM | POA: Diagnosis not present

## 2022-06-20 DIAGNOSIS — D696 Thrombocytopenia, unspecified: Secondary | ICD-10-CM | POA: Diagnosis not present

## 2022-06-20 DIAGNOSIS — R32 Unspecified urinary incontinence: Secondary | ICD-10-CM | POA: Diagnosis not present

## 2022-06-20 DIAGNOSIS — F32A Depression, unspecified: Secondary | ICD-10-CM | POA: Diagnosis not present

## 2022-06-20 DIAGNOSIS — I2510 Atherosclerotic heart disease of native coronary artery without angina pectoris: Secondary | ICD-10-CM | POA: Diagnosis not present

## 2022-06-20 DIAGNOSIS — J9610 Chronic respiratory failure, unspecified whether with hypoxia or hypercapnia: Secondary | ICD-10-CM | POA: Diagnosis not present

## 2022-07-17 DEATH — deceased

## 2022-08-31 NOTE — Progress Notes (Deleted)
[place]   [phone] [phone]   Clinic Follow up Note   Patient Care Team: [name], MD as PCP - General (Internal Medicine) Truitt Merle, MD as Consulting Physician (Hematology)  Date of Service:  08/31/2022  I connected with [name] [name] on 08/31/2022 at 10:40 AM EST by {Blank single:19197::"video enabled telemedicine visit","telephone visit"} and verified that I am speaking with the correct person using two identifiers.  I discussed the limitations, risks, security and privacy concerns of performing an evaluation and management service by telephone and the availability of in person appointments. I also discussed with the patient that there may be a patient responsible charge related to this service. The patient expressed understanding and agreed to proceed.   Other persons participating in the visit and their role in the encounter:  ***  Patient's location:  *** Provider's location:  ***  CHIEF COMPLAINT: f/u of  Polycythemia Vera   CURRENT THERAPY:  Phlebotomy as needed if HCT>50%.   ASSESSMENT & PLAN: *** [name] [name] is a 89 y.o. male with   ***   ***  No problem-specific Assessment & Plan notes found for this encounter.    SUMMARY OF ONCOLOGIC HISTORY: Oncology History   No history exists.     INTERVAL HISTORY: *** [name] [name] was contacted for a follow up of  Polycythemia Vera . He was last seen by me on 03/01/2022.    All other systems were reviewed with the patient and are negative.  MEDICAL HISTORY:  Past Medical History:  Diagnosis Date   Bone marrow disease    BPH (benign prostatic hypertrophy)    Depression    DJD (degenerative joint disease)    Dyslipidemia    Gout    HTN (hypertension)    Mild aortic stenosis    Polycythemia vera(238.4)     SURGICAL HISTORY: Past Surgical History:  Procedure Laterality Date   ENDOVENOUS ABLATION SAPHENOUS VEIN W/ LASER Left 09/04/2016   endovenous  laser ablation left greater saphenous vein by Tinnie Gens MD   HEMORROIDECTOMY      I have reviewed the social history and family history with the patient and they are unchanged from previous note.  ALLERGIES:  is allergic to amoxicillin and colchicine.  MEDICATIONS:  Current Outpatient Medications  Medication Sig Dispense Refill   acetaminophen (TYLENOL) 500 MG tablet Take 650 mg by mouth every 6 (six) hours as needed for mild pain.     allopurinol (ZYLOPRIM) 100 MG tablet Take 100 mg by mouth 2 (two) times daily.      escitalopram (LEXAPRO) 10 MG tablet Take 10 mg by mouth daily.     QUEtiapine (SEROQUEL) 25 MG tablet Take 1 tablet (25 mg total) by mouth at bedtime. 30 tablet 0   terazosin (HYTRIN) 5 MG capsule Take 5 mg by mouth daily.     No current facility-administered medications for this visit.    PHYSICAL EXAMINATION: ECOG PERFORMANCE STATUS: {CHL ONC ECOG PS:[id number]}  There were no vitals filed for this visit. Wt Readings from Last 3 Encounters:  03/15/22 149 lb 11.1 oz (67.9 kg)  03/01/22 151 lb 11.2 oz (68.8 kg)  11/29/21 156 lb 8 oz (71 kg)    *** No vitals taken today, Exam not performed today  LABORATORY DATA:  I have reviewed the data as listed    Latest Ref Rng & Units 03/23/2022    7:30 AM 03/22/2022    8:38 AM 03/21/2022  5:07 AM  CBC  WBC 4.0 - 10.5 K/uL 25.9  25.9  29.2   Hemoglobin 13.0 - 17.0 g/dL 14.2  13.6  14.2   Hematocrit 39.0 - 52.0 % 49.7  49.2  49.8   Platelets 150 - 400 K/uL 15  15  17         $ Latest Ref Rng & Units 03/23/2022    7:30 AM 03/22/2022    8:38 AM 03/21/2022    5:07 AM  CMP  Glucose 70 - 99 mg/dL 79  77  80   BUN 8 - 23 mg/dL 26  28  33   Creatinine 0.61 - 1.24 mg/dL 1.22  1.12  1.33   Sodium 135 - 145 mmol/L 141  141  141   Potassium 3.5 - 5.1 mmol/L 4.2  3.0  4.0   Chloride 98 - 111 mmol/L 111  115  108   CO2 22 - 32 mmol/L 21  21  24   $ Calcium 8.9 - 10.3 mg/dL 8.4  6.5  8.4       RADIOGRAPHIC STUDIES: I have  personally reviewed the radiological images as listed and agreed with the findings in the report. No results found.    No orders of the defined types were placed in this encounter.  All questions were answered. The patient knows to call the clinic with any problems, questions or concerns. No barriers to learning was detected. The total time spent in the appointment was {CHL ONC TIME VISIT - [id number].     Baldemar Friday, CMA 08/31/2022   Felicity Coyer am acting as scribe for Truitt Merle, MD.   {Add scribe attestation statement}

## 2022-09-01 ENCOUNTER — Inpatient Hospital Stay: Payer: Self-pay | Admitting: Hematology

## 2022-09-01 ENCOUNTER — Other Ambulatory Visit: Payer: Medicare Other

## 2022-09-01 ENCOUNTER — Telehealth: Payer: Self-pay

## 2022-09-01 ENCOUNTER — Ambulatory Visit: Payer: Medicare Other

## 2022-09-01 NOTE — Telephone Encounter (Signed)
LVM stating that Dr. Burr Medico would like to cancel the pt's telephone visit that is scheduled for today.  Requested pt to give Dr. Ernestina Penna office a call if they have questions.  (Pt is on hospice with [facility])
